# Patient Record
Sex: Male | Born: 1935 | Race: White | Marital: Married | Smoking: Never smoker
Health system: Southern US, Community
[De-identification: ages and names within clinical notes are randomized; demographics above are authoritative.]

## PROBLEM LIST (undated history)

## (undated) DIAGNOSIS — M545 Low back pain, unspecified: Secondary | ICD-10-CM

## (undated) DIAGNOSIS — E7800 Pure hypercholesterolemia, unspecified: Secondary | ICD-10-CM

## (undated) DIAGNOSIS — N400 Enlarged prostate without lower urinary tract symptoms: Secondary | ICD-10-CM

## (undated) DIAGNOSIS — M109 Gout, unspecified: Secondary | ICD-10-CM

## (undated) DIAGNOSIS — I2510 Atherosclerotic heart disease of native coronary artery without angina pectoris: Secondary | ICD-10-CM

## (undated) DIAGNOSIS — C61 Malignant neoplasm of prostate: Secondary | ICD-10-CM

## (undated) DIAGNOSIS — R0602 Shortness of breath: Secondary | ICD-10-CM

## (undated) DIAGNOSIS — I219 Acute myocardial infarction, unspecified: Secondary | ICD-10-CM

## (undated) DIAGNOSIS — I10 Essential (primary) hypertension: Secondary | ICD-10-CM

## (undated) DIAGNOSIS — G8929 Other chronic pain: Secondary | ICD-10-CM

## (undated) DIAGNOSIS — C44300 Unspecified malignant neoplasm of skin of unspecified part of face: Secondary | ICD-10-CM

## (undated) DIAGNOSIS — M1990 Unspecified osteoarthritis, unspecified site: Secondary | ICD-10-CM

## (undated) DIAGNOSIS — Z9289 Personal history of other medical treatment: Secondary | ICD-10-CM

## (undated) DIAGNOSIS — E119 Type 2 diabetes mellitus without complications: Secondary | ICD-10-CM

## (undated) DIAGNOSIS — N189 Chronic kidney disease, unspecified: Secondary | ICD-10-CM

## (undated) DIAGNOSIS — I82409 Acute embolism and thrombosis of unspecified deep veins of unspecified lower extremity: Secondary | ICD-10-CM

## (undated) HISTORY — PX: CATARACT EXTRACTION W/ INTRAOCULAR LENS  IMPLANT, BILATERAL: SHX1307

## (undated) HISTORY — PX: APPLICATION OF WOUND VAC: SHX5189

## (undated) HISTORY — PX: APPENDECTOMY: SHX54

## (undated) HISTORY — PX: BACK SURGERY: SHX140

---

## 2000-08-18 DIAGNOSIS — I219 Acute myocardial infarction, unspecified: Secondary | ICD-10-CM

## 2000-08-18 HISTORY — DX: Acute myocardial infarction, unspecified: I21.9

## 2000-11-09 HISTORY — PX: CORONARY ARTERY BYPASS GRAFT: SHX141

## 2012-11-15 ENCOUNTER — Other Ambulatory Visit: Payer: Self-pay | Admitting: Internal Medicine

## 2012-11-15 DIAGNOSIS — IMO0002 Reserved for concepts with insufficient information to code with codable children: Secondary | ICD-10-CM

## 2012-11-16 ENCOUNTER — Ambulatory Visit
Admission: RE | Admit: 2012-11-16 | Discharge: 2012-11-16 | Disposition: A | Discharge status: Home / Self Care | Payer: Medicare Other | Source: Ambulatory Visit | Attending: Internal Medicine | Admitting: Internal Medicine

## 2012-11-16 DIAGNOSIS — IMO0002 Reserved for concepts with insufficient information to code with codable children: Secondary | ICD-10-CM

## 2012-12-23 ENCOUNTER — Ambulatory Visit (HOSPITAL_COMMUNITY)
Admission: RE | Admit: 2012-12-23 | Discharge: 2012-12-23 | Disposition: A | Discharge status: Home / Self Care | Payer: Medicare Other | Source: Ambulatory Visit | Attending: Orthopedic Surgery | Admitting: Orthopedic Surgery

## 2012-12-23 ENCOUNTER — Other Ambulatory Visit (HOSPITAL_COMMUNITY): Payer: Self-pay | Admitting: Orthopedic Surgery

## 2012-12-23 DIAGNOSIS — M549 Dorsalgia, unspecified: Secondary | ICD-10-CM

## 2012-12-23 DIAGNOSIS — M438X9 Other specified deforming dorsopathies, site unspecified: Secondary | ICD-10-CM | POA: Insufficient documentation

## 2012-12-23 DIAGNOSIS — M51379 Other intervertebral disc degeneration, lumbosacral region without mention of lumbar back pain or lower extremity pain: Secondary | ICD-10-CM | POA: Insufficient documentation

## 2012-12-23 DIAGNOSIS — M8448XA Pathological fracture, other site, initial encounter for fracture: Secondary | ICD-10-CM | POA: Insufficient documentation

## 2012-12-23 DIAGNOSIS — M5137 Other intervertebral disc degeneration, lumbosacral region: Secondary | ICD-10-CM | POA: Insufficient documentation

## 2012-12-23 DIAGNOSIS — Z01818 Encounter for other preprocedural examination: Secondary | ICD-10-CM | POA: Insufficient documentation

## 2013-03-03 ENCOUNTER — Encounter (HOSPITAL_COMMUNITY): Payer: Self-pay

## 2013-03-07 NOTE — Pre-Procedure Instructions (Signed)
[name] [name]  03/07/2013   Your procedure is scheduled on:  Wednesday 03/16/13  Report to [facility] at 630 AM.  Call this number if you have problems the morning of surgery: [phone]   Remember:   Do not eat food or drink liquids after midnight.   Take these medicines the morning of surgery with A SIP OF WATER:  alphagan  Eye drops, flomax   Do not wear jewelry, make-up or nail polish.  Do not wear lotions, powders, or perfumes. You may wear deodorant.  Do not shave 48 hours prior to surgery. Men may shave face and neck.  Do not bring valuables to the hospital.  [facility] is not responsible                   for any belongings or valuables.  Contacts, dentures or bridgework may not be worn into surgery.  Leave suitcase in the car. After surgery it may be brought to your room.  For patients admitted to the hospital, checkout time is 11:00 AM the day of  discharge.   Patients discharged the day of surgery will not be allowed to drive  home.  Name and phone number of your driver:   Special Instructions: Shower using CHG 2 nights before surgery and the night before surgery.  If you shower the day of surgery use CHG.  Use special wash - you have one bottle of CHG for all showers.  You should use approximately 1/3 of the bottle for each shower.   Please read over the following fact sheets that you were given: Pain Booklet, Coughing and Deep Breathing, Blood Transfusion Information, MRSA Information and Surgical Site Infection Prevention

## 2013-03-08 ENCOUNTER — Ambulatory Visit (HOSPITAL_COMMUNITY)
Admission: RE | Admit: 2013-03-08 | Discharge: 2013-03-08 | Disposition: A | Discharge status: Home / Self Care | Payer: Medicare Other | Source: Ambulatory Visit | Attending: Orthopedic Surgery | Admitting: Orthopedic Surgery

## 2013-03-08 ENCOUNTER — Encounter (HOSPITAL_COMMUNITY)
Admission: RE | Admit: 2013-03-08 | Discharge: 2013-03-08 | Disposition: A | Discharge status: Home / Self Care | Payer: Medicare Other | Source: Ambulatory Visit | Attending: Orthopedic Surgery | Admitting: Orthopedic Surgery

## 2013-03-08 ENCOUNTER — Encounter (HOSPITAL_COMMUNITY): Payer: Self-pay

## 2013-03-08 DIAGNOSIS — J984 Other disorders of lung: Secondary | ICD-10-CM | POA: Insufficient documentation

## 2013-03-08 DIAGNOSIS — Z01818 Encounter for other preprocedural examination: Secondary | ICD-10-CM | POA: Insufficient documentation

## 2013-03-08 DIAGNOSIS — Z01812 Encounter for preprocedural laboratory examination: Secondary | ICD-10-CM | POA: Insufficient documentation

## 2013-03-08 DIAGNOSIS — M545 Low back pain, unspecified: Secondary | ICD-10-CM | POA: Insufficient documentation

## 2013-03-08 DIAGNOSIS — K449 Diaphragmatic hernia without obstruction or gangrene: Secondary | ICD-10-CM | POA: Insufficient documentation

## 2013-03-08 DIAGNOSIS — Z01810 Encounter for preprocedural cardiovascular examination: Secondary | ICD-10-CM | POA: Insufficient documentation

## 2013-03-08 DIAGNOSIS — M4000 Postural kyphosis, site unspecified: Secondary | ICD-10-CM | POA: Insufficient documentation

## 2013-03-08 DIAGNOSIS — M5137 Other intervertebral disc degeneration, lumbosacral region: Secondary | ICD-10-CM | POA: Insufficient documentation

## 2013-03-08 DIAGNOSIS — R9431 Abnormal electrocardiogram [ECG] [EKG]: Secondary | ICD-10-CM | POA: Insufficient documentation

## 2013-03-08 DIAGNOSIS — X58XXXA Exposure to other specified factors, initial encounter: Secondary | ICD-10-CM | POA: Insufficient documentation

## 2013-03-08 DIAGNOSIS — Z0183 Encounter for blood typing: Secondary | ICD-10-CM | POA: Insufficient documentation

## 2013-03-08 DIAGNOSIS — S22009A Unspecified fracture of unspecified thoracic vertebra, initial encounter for closed fracture: Secondary | ICD-10-CM | POA: Insufficient documentation

## 2013-03-08 DIAGNOSIS — I10 Essential (primary) hypertension: Secondary | ICD-10-CM | POA: Insufficient documentation

## 2013-03-08 DIAGNOSIS — M51379 Other intervertebral disc degeneration, lumbosacral region without mention of lumbar back pain or lower extremity pain: Secondary | ICD-10-CM | POA: Insufficient documentation

## 2013-03-08 HISTORY — DX: Unspecified osteoarthritis, unspecified site: M19.90

## 2013-03-08 HISTORY — DX: Atherosclerotic heart disease of native coronary artery without angina pectoris: I25.10

## 2013-03-08 HISTORY — DX: Essential (primary) hypertension: I10

## 2013-03-08 HISTORY — DX: Chronic kidney disease, unspecified: N18.9

## 2013-03-08 HISTORY — DX: Acute myocardial infarction, unspecified: I21.9

## 2013-03-08 LAB — ABO/RH: ABO/RH(D): A POS

## 2013-03-08 NOTE — Progress Notes (Addendum)
Anesthesia Chart Review:  Patient is a 79 year old male scheduled for T9-L3 fusion on 03/16/13 by Dr. Shon Baton.  He has a severe T12 compression fracture.  He developed back pain back in April 2014 after loading heavy supplies into his car while at [employer].  I was not asked to evaluate him during his [name] visit, but I did call and speak with him today.  History includes CAD/MI s/p CABG > 10 years ago, HTN, DM2, BPH, arthritis, appendectomy, former smoker.  He was previously followed at a [facility] in [place], but moved to [place] one year ago.  He is followed by the [facility] in [place] primarily, but has seen a physician once or twice at [place] [facility] [address].  He is not followed by a cardiologist and does not think that he has had any cardiac testing within the past five years.  He denies chest pain, SOB, edema. He is able to do his own shopping.  He reports that his DM has been fairly well controlled with numbers well below 250.  EKG on 03/08/13 showed NSR, LAD, non-specific ST abnormality.  CXR on 03/08/13 showed left basilar scarring.  Borderline heart size.  Moderate sized hiatal hernia.  Severe T12 compression fracture.    According to [name] RN, lab called and said the CBC and BMET tubes could not be located.  These will need to be repeated pre-operatively.  T&S was done.  I've notified [name] [name] at Dr. Shon Baton' office and the patient.  I will have our schedulers contact him for a lab only appointment.  Patient was medically cleared by a physician at the [facility] ([name]. B____ [illegible]), but patient denies any recent cardiology follow-up or known testing.  I have reviewed currently available information with anesthesiologists [name].  Based on what is known, he will likely need a functional study or cardiology evaluation preoperatively.  I will follow-up any additional records from the [facility].  I have also left a message for [name] [name] to call me to further discuss.  [name] [name] [facility]/Anesthesiology Phone [phone] 03/08/2013 4:56 PM  Addendum: 03/09/13 11:35 AM I received additional records from the [facility].  I asked for most recent notes and any cardiac records, and the only records received were his admission note and operative note from March 2002.  He presented with a NQWMI, underwent cardiac cath that showed 90% LM and severe 3V CAD.  A IABP was placed and he subsequently underwent CABG X 4 (LIMA to LAD, SVG to RPDA, sequential SVG to OM2 and D1) by [name] on 11/09/00.  I spoke with [name] [name] earlier today, and their office has arranged for patient to be evaluated by cardiologist Dr. Tresa [name] on 03/11/13.  Addendum: 03/15/13 3:40 PM Cardiologist Dr. Tresa [name] cleared patient for surgery from a cardiac standpoint.  Nuclear stress test from 03/15/13 showed: Low risk stress nuclear study with mild diaphragmatic attenuation artifact. LV Wall Motion: NL LV Function; NL Wall Motion.  Echo on 03/14/13 showed: - Left ventricle: The cavity size was normal. Wall thickness was increased in a pattern of mild LVH. Systolic function was normal. The estimated ejection fraction was in the range of 55% to 60%. Wall motion was normal; there were no regional wall motion abnormalities. Left ventricular diastolic function parameters were normal. - Left atrium: The atrium was moderately dilated. - Pulmonary arteries: PA peak pressure: 31mm Hg (S).  Labs from 03/14/13 showed elevated total bilirubin, but normal AST/ALT (  could consider Gilbert's syndrome; total bilirubin was also elevated at 1.8 01/20/13 according to [facility] records).  Cr 0.82, glucose 117.  H/H 13.2/38.2.  (CBC, CMET routed to Dr. Shon Baton for review, since these were ordered under Dr. Landry [name] name.) TSH WNL.    Patient has now been cleared for this procedure by cardiology and his PCP.

## 2013-03-08 NOTE — Progress Notes (Signed)
Pt had cabg > 10 yrs ago. [facility] give  [facility]for [facility] [place] [facility]   Recently moved here and has not [facility] with [facility] [place].

## 2013-03-09 ENCOUNTER — Encounter (HOSPITAL_COMMUNITY): Payer: Self-pay

## 2013-03-11 ENCOUNTER — Encounter: Payer: Self-pay | Admitting: Cardiovascular Disease

## 2013-03-11 ENCOUNTER — Ambulatory Visit (INDEPENDENT_AMBULATORY_CARE_PROVIDER_SITE_OTHER): Payer: Medicare Other | Admitting: Cardiovascular Disease

## 2013-03-11 VITALS — BP 138/80 | HR 79 | Ht 68.0 in | Wt 202.0 lb

## 2013-03-11 DIAGNOSIS — I2510 Atherosclerotic heart disease of native coronary artery without angina pectoris: Secondary | ICD-10-CM

## 2013-03-11 DIAGNOSIS — R5383 Other fatigue: Secondary | ICD-10-CM

## 2013-03-11 DIAGNOSIS — E119 Type 2 diabetes mellitus without complications: Secondary | ICD-10-CM

## 2013-03-11 DIAGNOSIS — Z79899 Other long term (current) drug therapy: Secondary | ICD-10-CM

## 2013-03-11 DIAGNOSIS — R5381 Other malaise: Secondary | ICD-10-CM

## 2013-03-11 DIAGNOSIS — I10 Essential (primary) hypertension: Secondary | ICD-10-CM

## 2013-03-11 DIAGNOSIS — E785 Hyperlipidemia, unspecified: Secondary | ICD-10-CM

## 2013-03-11 NOTE — Patient Instructions (Addendum)
Your physician recommends that you return for lab work CMP, CBC, TSH, FREE T4, LIPIDS.  Your physician has requested that you have an echocardiogram. Echocardiography is a painless test that uses sound waves to create images of your heart. It provides your doctor with information about the size and shape of your heart and how well your heart's chambers and valves are working. This procedure takes approximately one hour. There are no restrictions for this procedure.  Your physician has requested that you have a lexiscan myoview. For further information please visit [email or web address] Please follow instruction sheet, as given.

## 2013-03-14 ENCOUNTER — Ambulatory Visit (HOSPITAL_BASED_OUTPATIENT_CLINIC_OR_DEPARTMENT_OTHER)
Admission: RE | Admit: 2013-03-14 | Discharge: 2013-03-14 | Disposition: A | Discharge status: Home / Self Care | Payer: Medicare Other | Source: Ambulatory Visit | Attending: Internal Medicine | Admitting: Internal Medicine

## 2013-03-14 DIAGNOSIS — N189 Chronic kidney disease, unspecified: Secondary | ICD-10-CM | POA: Diagnosis present

## 2013-03-14 DIAGNOSIS — I2510 Atherosclerotic heart disease of native coronary artery without angina pectoris: Secondary | ICD-10-CM | POA: Diagnosis present

## 2013-03-14 DIAGNOSIS — Z01810 Encounter for preprocedural cardiovascular examination: Secondary | ICD-10-CM | POA: Insufficient documentation

## 2013-03-14 DIAGNOSIS — M4000 Postural kyphosis, site unspecified: Secondary | ICD-10-CM | POA: Diagnosis present

## 2013-03-14 DIAGNOSIS — S22009A Unspecified fracture of unspecified thoracic vertebra, initial encounter for closed fracture: Principal | ICD-10-CM | POA: Diagnosis present

## 2013-03-14 DIAGNOSIS — E119 Type 2 diabetes mellitus without complications: Secondary | ICD-10-CM | POA: Diagnosis present

## 2013-03-14 DIAGNOSIS — D62 Acute posthemorrhagic anemia: Secondary | ICD-10-CM | POA: Diagnosis not present

## 2013-03-14 DIAGNOSIS — IMO0002 Reserved for concepts with insufficient information to code with codable children: Secondary | ICD-10-CM | POA: Diagnosis present

## 2013-03-14 DIAGNOSIS — M4120 Other idiopathic scoliosis, site unspecified: Secondary | ICD-10-CM | POA: Diagnosis present

## 2013-03-14 DIAGNOSIS — X500XXA Overexertion from strenuous movement or load, initial encounter: Secondary | ICD-10-CM | POA: Diagnosis present

## 2013-03-14 DIAGNOSIS — I129 Hypertensive chronic kidney disease with stage 1 through stage 4 chronic kidney disease, or unspecified chronic kidney disease: Secondary | ICD-10-CM | POA: Diagnosis present

## 2013-03-14 LAB — COMPREHENSIVE METABOLIC PANEL
ALT: 14 U/L (ref 0–53)
AST: 25 U/L (ref 0–37)
Albumin: 3.7 g/dL (ref 3.5–5.2)
CO2: 27 mEq/L (ref 19–32)
Calcium: 9.6 mg/dL (ref 8.4–10.5)
Chloride: 112 mEq/L (ref 96–112)
Potassium: 3.9 mEq/L (ref 3.5–5.3)
Sodium: 146 mEq/L — ABNORMAL HIGH (ref 135–145)
Total Protein: 6.5 g/dL (ref 6.0–8.3)

## 2013-03-14 LAB — CBC
MCHC: 34.6 g/dL (ref 30.0–36.0)
Platelets: 159 10*3/uL (ref 150–400)
RDW: 14.5 % (ref 11.5–15.5)
WBC: 5.5 10*3/uL (ref 4.0–10.5)

## 2013-03-14 LAB — TSH: TSH: 1.685 u[IU]/mL (ref 0.350–4.500)

## 2013-03-14 LAB — LIPID PANEL
LDL Cholesterol: 91 mg/dL (ref 0–99)
VLDL: 17 mg/dL (ref 0–40)

## 2013-03-14 LAB — HEMOGLOBIN A1C: Mean Plasma Glucose: 111 mg/dL (ref <117)

## 2013-03-14 NOTE — Progress Notes (Signed)
[facility]   2D echo completed 03/14/2013.   Cindy Draylen Lobue, RDCS  

## 2013-03-15 ENCOUNTER — Encounter: Payer: Self-pay | Admitting: Cardiovascular Disease

## 2013-03-15 ENCOUNTER — Telehealth: Payer: Self-pay | Admitting: Cardiovascular Disease

## 2013-03-15 ENCOUNTER — Ambulatory Visit (HOSPITAL_BASED_OUTPATIENT_CLINIC_OR_DEPARTMENT_OTHER)
Admission: RE | Admit: 2013-03-15 | Discharge: 2013-03-15 | Disposition: A | Discharge status: Home / Self Care | Payer: Medicare Other | Source: Ambulatory Visit | Attending: Cardiovascular Disease | Admitting: Cardiovascular Disease

## 2013-03-15 DIAGNOSIS — E785 Hyperlipidemia, unspecified: Secondary | ICD-10-CM | POA: Insufficient documentation

## 2013-03-15 DIAGNOSIS — Z87891 Personal history of nicotine dependence: Secondary | ICD-10-CM | POA: Insufficient documentation

## 2013-03-15 DIAGNOSIS — R9431 Abnormal electrocardiogram [ECG] [EKG]: Secondary | ICD-10-CM | POA: Insufficient documentation

## 2013-03-15 DIAGNOSIS — E119 Type 2 diabetes mellitus without complications: Secondary | ICD-10-CM | POA: Insufficient documentation

## 2013-03-15 DIAGNOSIS — I252 Old myocardial infarction: Secondary | ICD-10-CM | POA: Insufficient documentation

## 2013-03-15 DIAGNOSIS — I2510 Atherosclerotic heart disease of native coronary artery without angina pectoris: Secondary | ICD-10-CM | POA: Insufficient documentation

## 2013-03-15 DIAGNOSIS — E663 Overweight: Secondary | ICD-10-CM | POA: Insufficient documentation

## 2013-03-15 DIAGNOSIS — S22081A Stable burst fracture of T11-T12 vertebra, initial encounter for closed fracture: Secondary | ICD-10-CM | POA: Insufficient documentation

## 2013-03-15 DIAGNOSIS — R0989 Other specified symptoms and signs involving the circulatory and respiratory systems: Secondary | ICD-10-CM | POA: Insufficient documentation

## 2013-03-15 DIAGNOSIS — R0609 Other forms of dyspnea: Secondary | ICD-10-CM | POA: Insufficient documentation

## 2013-03-15 DIAGNOSIS — I10 Essential (primary) hypertension: Secondary | ICD-10-CM | POA: Insufficient documentation

## 2013-03-15 HISTORY — PX: LUMBAR FUSION: SHX111

## 2013-03-15 MED ORDER — CEFAZOLIN SODIUM-DEXTROSE 2-3 GM-% IV SOLR
2.0000 g | INTRAVENOUS | Status: AC
  Administered 2013-03-16 (×2): 2 g via INTRAVENOUS
  Filled 2013-03-15: qty 50

## 2013-03-15 MED ORDER — REGADENOSON 0.4 MG/5ML IV SOLN
0.4000 mg | Freq: Once | INTRAVENOUS | Status: AC
  Administered 2013-03-15: 0.4 mg via INTRAVENOUS

## 2013-03-15 MED ORDER — TECHNETIUM TC 99M SESTAMIBI GENERIC - CARDIOLITE
30.0000 | Freq: Once | INTRAVENOUS | Status: AC | PRN
  Administered 2013-03-15: 30 via INTRAVENOUS

## 2013-03-15 MED ORDER — TECHNETIUM TC 99M SESTAMIBI GENERIC - CARDIOLITE
10.0000 | Freq: Once | INTRAVENOUS | Status: AC | PRN
  Administered 2013-03-15: 10 via INTRAVENOUS

## 2013-03-15 NOTE — Telephone Encounter (Signed)
Pt cleared for surgery per [name].  Form was completed and faxed to [facility].  Call to [facility] and informed.  Verbalized understanding.

## 2013-03-15 NOTE — Telephone Encounter (Signed)
Returned call.  Left message to call back before 4pm.  Message forwarded to [name] for review.  OV note still in progress.

## 2013-03-15 NOTE — Progress Notes (Signed)
Patient ID: [name] [name], male   DOB: 07-31-1934, 79 y.o.   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   HPI: [name] [name] is a 79 year old gentleman who has a history of hypertension for at least 15-20 years, type 2 diabetes mellitus, as well as hyperlipidemia. He is status post CABG surgery x4 which was done in [place] after he had suffered a myocardial infarction and was found to have high-grade left main stenosis. He underwent surgery x4 in 2002 with a LIMA to the LAD, SVG to the PDA, and sequential vein graft to the OM 2 and the one vessel. This was done by [name] [name] in [place]. He is 2 for area approximately one year ago. He does have a history of possible carotid disease and apparently had recently undergone a carotid Doppler test at the [facility] which I do not know the results per he recently had sustained a T12 burst fracture and is in need for surgery by [name] [name]. He presents now for preoperative cardiology clearance. He denies recent chest pain. He does note shortness of breath he also does note some leg swelling.  Past Medical History  Diagnosis Date  . Hypertension   . Diabetes mellitus without complication   . Chronic kidney disease     bph  . Arthritis   . Coronary artery disease   . Myocardial infarction     Past Surgical History  Procedure Laterality Date  . Appendectomy    . Coronary artery bypass graft  11/09/2000    LIMA to LAD, SVG to RPDA, seq SVG to OM2 and D1 by [name] [name] [facility]    No Known Allergies  Current Outpatient Prescriptions  Medication Sig Dispense Refill  . brimonidine (ALPHAGAN) 0.2 % ophthalmic solution Place 1 drop into both eyes 2 (two) times daily.      . calcium-vitamin D (OSCAL WITH D) 250-125 MG-UNIT per tablet Take 2 tablets by mouth 2 (two)  times daily.      . cholecalciferol (VITAMIN D) 1000 UNITS tablet Take 1,000 Units by mouth daily.      . Coenzyme Q10 (COQ-10) 100 MG CAPS Take 200 mg by mouth daily.      . fish oil-omega-3 fatty acids 1000 MG capsule Take 1 g by mouth daily.       [name] glipiZIDE (GLUCOTROL) 5 MG tablet Take 2.5 mg by mouth daily.       [name] glucosamine-chondroitin 500-400 MG tablet Take 1 tablet by mouth daily.       [name] losartan (COZAAR) 100 MG tablet Take 100 mg by mouth daily.      . metFORMIN (GLUCOPHAGE) 1000 MG tablet Take 1,000 mg by mouth daily.      . Multiple Vitamins-Minerals (MULTIVITAMIN WITH MINERALS) tablet Take 1 tablet by mouth daily.      . Pyridoxine HCl (VITAMIN B-6) 500 MG tablet Take 500 mg by mouth daily.      . simvastatin (ZOCOR) 20 MG tablet Take 10 mg by mouth every evening.      . tamsulosin (FLOMAX) 0.4 MG CAPS Take 0.4 mg by mouth daily.       No current facility-administered medications for this visit.    Social history is notable in that he is married for 54 years. 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 He is retired there is no tobacco history. He does not drink alcohol.  Family History  Problem Relation Age of Onset  . Diabetes Mother   . Stroke Brother     x2    ROS is negative for fever chills or night sweats. He is unaware of palpitations. He does note shortness of breath. He does have difficulty with vision particularly his left eye where he feels he cannot see well. He denies wheezing or denies bleeding. He's been limited by his significant low back discomfort and was found to have a fracture of T12 vertebrae the need for surgery. He does have some mild leg swelling. He denies paresthesias Other system review is negative.  PE BP 138/80  Pulse 79  Ht 5\' 8"  (1.727 m)  Wt 202 lb (91.627 kg)  BMI 30.72 kg/m2 General: Alert, oriented, no distress.  Skin: normal turgor, no rashes HEENT: Normocephalic, atraumatic. Pupils round and reactive;  sclera anicteric; Fundi no hemorrhages or exudates. Nose without nasal septal hypertrophy Mouth/Parynx benign; Mallinpatti scale 3 he is wearing dentures. Neck: No JVD, soft left carotid bruit per carotid briut Lungs: clear to ausculatation and percussion; no wheezing or rales Heart: RRR, s1 s2 normal 2/6 systolic murmur. Abdomen: soft, nontender; no hepatosplenomehaly, BS+; abdominal aorta nontender and not dilated by palpation. Pulses 2+ Extremities: Mild pretibial edema. no clubbinbg cyanosis, Homan's sign negative  Neurologic: grossly nonfocal Psychologic: normal affect and mood appear    ECG: Normal sinus rhythm. There is early transition. Nonspecific T abnormalities; intervals are normal.  LABS:  BMET    Component Value Date/Time   NA 146* 03/14/2013 0812   K 3.9 03/14/2013 0812   CL 112 03/14/2013 0812   CO2 27 03/14/2013 0812   GLUCOSE 117* 03/14/2013 0812   BUN 8 03/14/2013 0812   CREATININE 0.82 03/14/2013 0812   CALCIUM 9.6 03/14/2013 0812     Hepatic Function Panel     Component Value Date/Time   PROT 6.5 03/14/2013 0812   ALBUMIN 3.7 03/14/2013 0812   AST 25 03/14/2013 0812   ALT 14 03/14/2013 0812   ALKPHOS 38* 03/14/2013 0812   BILITOT 2.5* 03/14/2013 0812     CBC    Component Value Date/Time   WBC 5.5 03/14/2013 0812   RBC 4.25 03/14/2013 0812   HGB 13.2 03/14/2013 0812   HCT 38.2* 03/14/2013 0812   PLT 159 03/14/2013 0812   MCV 89.9 03/14/2013 0812   MCH 31.1 03/14/2013 0812   MCHC 34.6 03/14/2013 0812   RDW 14.5 03/14/2013 0812     BNP No results found for this basename: probnp    Lipid Panel     Component Value Date/Time   CHOL 148 03/14/2013 0808   TRIG 86 03/14/2013 0808   HDL 40 03/14/2013 0808   CHOLHDL 3.7 03/14/2013 0808   VLDL 17 03/14/2013 0808   LDLCALC 91 03/14/2013 0808     RADIOLOGY: Dg Chest 2 View  03/08/2013   *RADIOLOGY REPORT*  Clinical Data: Preop low back surgery.  Hypertension.  CHEST - 2 VIEW  Comparison: None.  Findings: Moderate  sized hiatal hernia.  Linear densities at the left base, likely scarring.  Right lung is clear.  Heart is borderline in size.  No effusions.  Severe compression fracture in the lower thoracic spine, likely T12 with associated kyphosis.  Prior median sternotomy and CABG.  IMPRESSION: Left basilar scarring.  Borderline heart size.  Moderate sized hiatal hernia.  Severe T12 compression fracture.   Original Report Authenticated By: Charlett Nose, M.D.   Dg Lumbar Spine 2-3 Views  03/08/2013   *RADIOLOGY REPORT*  Clinical Data: Preop fusion.  Low back pain.  LUMBAR SPINE - 2-3 VIEW  Comparison: 12/23/2012  Findings: Severe compression fracture again noted at T12, slightly progressed since prior study.  Associated kyphosis at this level.  Diffuse degenerative disc changes throughout the lumbar spine. Degenerative facet disease in the mid and lower lumbar spine.  No acute bony abnormality.  Aortic and iliac calcifications are present without visible aneurysm.  IMPRESSION: Severe T12 compression fracture, slightly progressed.  Degenerative disc and facet disease.   Original Report Authenticated By: Charlett Nose, M.D.     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 who has known coronary artery disease. In  2002 he presented to the [facility] in the setting of a non-ST segment elevation myocardial infarction. Cardiac catheterization revealed 90% left main stenosis with multiple segments of occlusion and disease representing three-vessel coronary obstructive disease. He  underwent CABG surgery x4 with LIMA to the LAD, SVG to PDA, and sequential SVG to the OM 2 and diagonal 1 vessel. Subsequently, he has done fairly well. He has been without chest pain. He does note shortness of breath. He does have peripheral vascular disease with a left carotid bruit on exam. His ECG is unremarkable. He is tentatively scheduled for surgery at the end of the month. I am scheduling him for  an echo Doppler study report, as well as a nuclear perfusion study for preoperative assessment. These will be reviewed and if these are not high risk to be getting clearance for his planned orthopedic surgery.    Lennette Bihari, MD, [facility] 03/15/2013 1:54 PM

## 2013-03-15 NOTE — Procedures (Addendum)
[facility] [address] [place] [address] [place] [address] [phone]  Cardiology Nuclear Med Study  [name] [name] is a 79 y.o. male     MRN : [id number]     DOB: Jan 12, 1934  Procedure Date: 03/15/2013  Nuclear Med Background Indication for Stress Test:  Surgical Clearance and Abnormal EKG History:  CAD;MI--2002;CABG X4--11/09/2000 Cardiac Risk Factors: History of Smoking, Hypertension, Lipids, NIDDM and Overweight  Symptoms:  DOE   Nuclear Pre-Procedure Caffeine/Decaff Intake:  7:00pm NPO After: 5:00am   IV Site: R Hand  IV 0.9% NS with Angio Cath:  22g  Chest Size (in):  42"  IV Started by: Emmit Pomfret, RN  Height: 5\' 8"  (1.727 m)  Cup Size: n/a  BMI:  Body mass index is 30.72 kg/(m^2). Weight:  202 lb (91.627 kg)   Tech Comments:  N/A    Nuclear Med Study 1 or 2 day study: 1 day  Stress Test Type:  Lexiscan  Order Authorizing Provider:  Nicki Guadalajara, MD   Resting Radionuclide: Technetium 90m Sestamibi  Resting Radionuclide Dose: 11.0 mCi   Stress Radionuclide:  Technetium 7m Sestamibi  Stress Radionuclide Dose: 29.6 mCi           Stress Protocol Rest HR: 73 Stress HR: 82  Rest BP: 167/97 Stress BP: 153/81  Exercise Time (min): n/a METS: n/a   Predicted Max HR: 144 bpm % Max HR: 56.94 bpm Rate Pressure Product: 21308  Dose of Adenosine (mg):  n/a Dose of Lexiscan: 0.4 mg  Dose of Atropine (mg): n/a Dose of Dobutamine: n/a mcg/kg/min (at max HR)  Stress Test Technologist: Esperanza Sheets, CCT Nuclear Technologist: Koren Shiver, CNMT   Rest Procedure:  Myocardial perfusion imaging was performed at rest 45 minutes following the intravenous administration of Technetium 57m Sestamibi. Stress Procedure:  The patient received IV Lexiscan 0.4 mg over 15-seconds.  Technetium 70m Sestamibi injected at 30-seconds.  There were no significant changes with Lexiscan.  Quantitative spect images were obtained after a 45 minute  delay.  Transient Ischemic Dilatation (Normal <1.22):  1.08 Lung/Heart Ratio (Normal <0.45):  0.28 QGS EDV:  86 ml QGS ESV:  25 ml LV Ejection Fraction: 71%     Rest ECG: NSR with non-specific ST-T wave changes  Stress ECG: No significant change from baseline ECG  QPS Raw Data Images:  Mild diaphragmatic attenuation.  Normal left ventricular size. Stress Images:  Normal homogeneous uptake in all areas of the myocardium. Rest Images:  Normal homogeneous uptake in all areas of the myocardium. Subtraction (SDS):  No evidence of ischemia.  Impression Exercise Capacity:  Lexiscan with no exercise. BP Response:  Normal blood pressure response. Clinical Symptoms:  No significant symptoms noted. ECG Impression:  No significant ST segment change suggestive of ischemia. Comparison with Prior Nuclear Study: No previous nuclear study performed  Overall Impression:  Low risk stress nuclear study with mild diaphragmatic attenuation artifact.  LV Wall Motion:  NL LV Function; NL Wall Motion   Rivka Baune, MD  03/15/2013 11:55 AM

## 2013-03-15 NOTE — Telephone Encounter (Signed)
Having surgery 6:30 tomorrow morning-need clarence for this asap-was seen here on 03-11-13-Please call asap.

## 2013-03-15 NOTE — Telephone Encounter (Signed)
[name] called back asking for status of clearance as pt is scheduled for surgery in the morning.  Informed [name] is completing note now and RN will notify her once information received.  Verbalized understanding and will refax clearance form.  Need response before 4pm.

## 2013-03-16 ENCOUNTER — Inpatient Hospital Stay (HOSPITAL_COMMUNITY): Payer: Medicare Other

## 2013-03-16 ENCOUNTER — Encounter: Payer: Self-pay | Admitting: *Deleted

## 2013-03-16 ENCOUNTER — Inpatient Hospital Stay (HOSPITAL_COMMUNITY): Payer: Medicare Other | Admitting: Anesthesiology

## 2013-03-16 ENCOUNTER — Encounter (HOSPITAL_COMMUNITY): Payer: Self-pay | Admitting: *Deleted

## 2013-03-16 ENCOUNTER — Encounter (HOSPITAL_COMMUNITY): Payer: Self-pay | Admitting: Vascular Surgery

## 2013-03-16 ENCOUNTER — Encounter (HOSPITAL_COMMUNITY)
Admission: RE | Disposition: A | Discharge status: Home / Self Care | Payer: Self-pay | Source: Ambulatory Visit | Attending: Orthopedic Surgery

## 2013-03-16 ENCOUNTER — Inpatient Hospital Stay (HOSPITAL_COMMUNITY)
Admission: RE | Admit: 2013-03-16 | Discharge: 2013-03-22 | DRG: 460 | Disposition: A | Discharge status: Home Health | Payer: Medicare Other | Source: Ambulatory Visit | Attending: Orthopedic Surgery | Admitting: Orthopedic Surgery

## 2013-03-16 HISTORY — PX: POSTERIOR LUMBAR FUSION 4 LEVEL: SHX6037

## 2013-03-16 LAB — GLUCOSE, CAPILLARY: Glucose-Capillary: 133 mg/dL — ABNORMAL HIGH (ref 70–99)

## 2013-03-16 SURGERY — POSTERIOR LUMBAR FUSION 4 LEVEL
Anesthesia: General | Site: Spine Thoracic | Wound class: Clean

## 2013-03-16 MED ORDER — ONDANSETRON HCL 4 MG/2ML IJ SOLN
INTRAMUSCULAR | Status: DC | PRN
  Administered 2013-03-16: 4 mg via INTRAVENOUS

## 2013-03-16 MED ORDER — PROPOFOL INFUSION 10 MG/ML OPTIME
INTRAVENOUS | Status: DC | PRN
  Administered 2013-03-16: 50 ug/kg/min via INTRAVENOUS

## 2013-03-16 MED ORDER — CEFAZOLIN SODIUM 1-5 GM-% IV SOLN
1.0000 g | Freq: Three times a day (TID) | INTRAVENOUS | Status: AC
  Administered 2013-03-16 – 2013-03-17 (×2): 1 g via INTRAVENOUS
  Filled 2013-03-16 (×2): qty 50

## 2013-03-16 MED ORDER — ONDANSETRON HCL 4 MG/2ML IJ SOLN: 4.0000 mg | INTRAMUSCULAR | Status: DC | PRN

## 2013-03-16 MED ORDER — ACETAMINOPHEN 500 MG PO TABS
1000.0000 mg | ORAL_TABLET | Freq: Four times a day (QID) | ORAL | Status: AC
  Administered 2013-03-17 (×3): 1000 mg via ORAL
  Filled 2013-03-16 (×4): qty 2

## 2013-03-16 MED ORDER — DEXTROSE 5 % IV SOLN
INTRAVENOUS | Status: DC | PRN
  Administered 2013-03-16: 09:00:00 via INTRAVENOUS

## 2013-03-16 MED ORDER — BUPIVACAINE-EPINEPHRINE 0.25% -1:200000 IJ SOLN
INTRAMUSCULAR | Status: DC | PRN
  Administered 2013-03-16: 10 mL

## 2013-03-16 MED ORDER — MORPHINE SULFATE (PF) 1 MG/ML IV SOLN
INTRAVENOUS | Status: AC
  Filled 2013-03-16: qty 25

## 2013-03-16 MED ORDER — NALOXONE HCL 0.4 MG/ML IJ SOLN: 0.4000 mg | INTRAMUSCULAR | Status: DC | PRN

## 2013-03-16 MED ORDER — VANCOMYCIN HCL 1000 MG IV SOLR
INTRAVENOUS | Status: AC
  Filled 2013-03-16: qty 1000

## 2013-03-16 MED ORDER — ACETAMINOPHEN 10 MG/ML IV SOLN
1000.0000 mg | Freq: Once | INTRAVENOUS | Status: AC
  Administered 2013-03-16: 1000 mg via INTRAVENOUS

## 2013-03-16 MED ORDER — PROPOFOL 10 MG/ML IV BOLUS
INTRAVENOUS | Status: DC | PRN
  Administered 2013-03-16: 100 mg via INTRAVENOUS

## 2013-03-16 MED ORDER — DIPHENHYDRAMINE HCL 12.5 MG/5ML PO ELIX: 12.5000 mg | ORAL_SOLUTION | Freq: Four times a day (QID) | ORAL | Status: DC | PRN

## 2013-03-16 MED ORDER — SODIUM CHLORIDE 0.9 % IV SOLN
INTRAVENOUS | Status: DC | PRN
  Administered 2013-03-16: 13:00:00 via INTRAVENOUS

## 2013-03-16 MED ORDER — SODIUM CHLORIDE 0.9 % IJ SOLN
3.0000 mL | INTRAMUSCULAR | Status: DC | PRN
  Administered 2013-03-20: 3 mL via INTRAVENOUS

## 2013-03-16 MED ORDER — ARTIFICIAL TEARS OP OINT
TOPICAL_OINTMENT | OPHTHALMIC | Status: DC | PRN
  Administered 2013-03-16: 1 via OPHTHALMIC

## 2013-03-16 MED ORDER — BRIMONIDINE TARTRATE 0.2 % OP SOLN
1.0000 [drp] | Freq: Two times a day (BID) | OPHTHALMIC | Status: DC
  Administered 2013-03-16 – 2013-03-22 (×12): 1 [drp] via OPHTHALMIC
  Filled 2013-03-16: qty 5

## 2013-03-16 MED ORDER — DEXAMETHASONE SODIUM PHOSPHATE 4 MG/ML IJ SOLN: 4.0000 mg | Freq: Once | INTRAMUSCULAR | Status: DC

## 2013-03-16 MED ORDER — INSULIN ASPART 100 UNIT/ML ~~LOC~~ SOLN
0.0000 [IU] | SUBCUTANEOUS | Status: DC
  Administered 2013-03-19 (×2): 3 [IU] via SUBCUTANEOUS

## 2013-03-16 MED ORDER — BUPIVACAINE-EPINEPHRINE PF 0.25-1:200000 % IJ SOLN
INTRAMUSCULAR | Status: AC
  Filled 2013-03-16: qty 30

## 2013-03-16 MED ORDER — SODIUM CHLORIDE 0.9 % IV SOLN: 250.0000 mL | INTRAVENOUS | Status: DC

## 2013-03-16 MED ORDER — DEXAMETHASONE 4 MG PO TABS
4.0000 mg | ORAL_TABLET | Freq: Four times a day (QID) | ORAL | Status: DC
  Administered 2013-03-16 – 2013-03-22 (×24): 4 mg via ORAL
  Filled 2013-03-16 (×27): qty 1

## 2013-03-16 MED ORDER — PROMETHAZINE HCL 25 MG/ML IJ SOLN: 6.2500 mg | INTRAMUSCULAR | Status: DC | PRN

## 2013-03-16 MED ORDER — SODIUM CHLORIDE 0.9 % IJ SOLN
3.0000 mL | Freq: Two times a day (BID) | INTRAMUSCULAR | Status: DC
  Administered 2013-03-16 – 2013-03-22 (×9): 3 mL via INTRAVENOUS

## 2013-03-16 MED ORDER — SODIUM CHLORIDE 0.9 % IR SOLN
Status: DC | PRN
  Administered 2013-03-16: 3000 mL

## 2013-03-16 MED ORDER — CEFAZOLIN SODIUM 1-5 GM-% IV SOLN
INTRAVENOUS | Status: AC
  Filled 2013-03-16: qty 100

## 2013-03-16 MED ORDER — 0.9 % SODIUM CHLORIDE (POUR BTL) OPTIME
TOPICAL | Status: DC | PRN
  Administered 2013-03-16: 1000 mL

## 2013-03-16 MED ORDER — LACTATED RINGERS IV SOLN
INTRAVENOUS | Status: DC
  Administered 2013-03-17 (×2): via INTRAVENOUS

## 2013-03-16 MED ORDER — OXYCODONE HCL 5 MG/5ML PO SOLN: 5.0000 mg | Freq: Once | ORAL | Status: DC | PRN

## 2013-03-16 MED ORDER — ZOLPIDEM TARTRATE 5 MG PO TABS: 5.0000 mg | ORAL_TABLET | Freq: Every evening | ORAL | Status: DC | PRN

## 2013-03-16 MED ORDER — MIDAZOLAM HCL 2 MG/2ML IJ SOLN: 0.5000 mg | Freq: Once | INTRAMUSCULAR | Status: DC | PRN

## 2013-03-16 MED ORDER — MIDAZOLAM HCL 5 MG/5ML IJ SOLN
INTRAMUSCULAR | Status: DC | PRN
  Administered 2013-03-16 (×2): 1 mg via INTRAVENOUS

## 2013-03-16 MED ORDER — DEXAMETHASONE SODIUM PHOSPHATE 4 MG/ML IJ SOLN
INTRAMUSCULAR | Status: DC | PRN
  Administered 2013-03-16: 4 mg via INTRAVENOUS

## 2013-03-16 MED ORDER — HYDROMORPHONE HCL PF 1 MG/ML IJ SOLN
INTRAMUSCULAR | Status: AC
  Filled 2013-03-16: qty 1

## 2013-03-16 MED ORDER — OXYCODONE HCL 5 MG PO TABS
10.0000 mg | ORAL_TABLET | ORAL | Status: DC | PRN
  Administered 2013-03-17 (×2): 10 mg via ORAL
  Filled 2013-03-16 (×2): qty 2

## 2013-03-16 MED ORDER — ALBUMIN HUMAN 5 % IV SOLN
INTRAVENOUS | Status: DC | PRN
  Administered 2013-03-16 (×2): via INTRAVENOUS

## 2013-03-16 MED ORDER — THROMBIN 20000 UNITS EX SOLR
CUTANEOUS | Status: AC
  Filled 2013-03-16: qty 20000

## 2013-03-16 MED ORDER — LOSARTAN POTASSIUM 50 MG PO TABS
100.0000 mg | ORAL_TABLET | Freq: Every day | ORAL | Status: DC
  Administered 2013-03-16 – 2013-03-22 (×7): 100 mg via ORAL
  Filled 2013-03-16 (×7): qty 2

## 2013-03-16 MED ORDER — SODIUM CHLORIDE 0.9 % IJ SOLN: 9.0000 mL | INTRAMUSCULAR | Status: DC | PRN

## 2013-03-16 MED ORDER — ACETAMINOPHEN 10 MG/ML IV SOLN
1000.0000 mg | Freq: Four times a day (QID) | INTRAVENOUS | Status: DC
  Administered 2013-03-16: 1000 mg via INTRAVENOUS
  Filled 2013-03-16 (×3): qty 100

## 2013-03-16 MED ORDER — SIMVASTATIN 10 MG PO TABS
10.0000 mg | ORAL_TABLET | Freq: Every evening | ORAL | Status: DC
  Administered 2013-03-16 – 2013-03-22 (×7): 10 mg via ORAL
  Filled 2013-03-16 (×7): qty 1

## 2013-03-16 MED ORDER — VANCOMYCIN HCL IN DEXTROSE 1-5 GM/200ML-% IV SOLN
INTRAVENOUS | Status: AC
  Filled 2013-03-16: qty 200

## 2013-03-16 MED ORDER — FLEET ENEMA 7-19 GM/118ML RE ENEM: 1.0000 | ENEMA | Freq: Once | RECTAL | Status: AC | PRN

## 2013-03-16 MED ORDER — OXYCODONE HCL 5 MG PO TABS: 5.0000 mg | ORAL_TABLET | Freq: Once | ORAL | Status: DC | PRN

## 2013-03-16 MED ORDER — TAMSULOSIN HCL 0.4 MG PO CAPS
0.4000 mg | ORAL_CAPSULE | Freq: Every day | ORAL | Status: DC
  Administered 2013-03-16 – 2013-03-22 (×7): 0.4 mg via ORAL
  Filled 2013-03-16 (×7): qty 1

## 2013-03-16 MED ORDER — SUCCINYLCHOLINE CHLORIDE 20 MG/ML IJ SOLN
INTRAMUSCULAR | Status: DC | PRN
  Administered 2013-03-16: 120 mg via INTRAVENOUS

## 2013-03-16 MED ORDER — FENTANYL CITRATE 0.05 MG/ML IJ SOLN
INTRAMUSCULAR | Status: DC | PRN
  Administered 2013-03-16: 450 ug via INTRAVENOUS
  Administered 2013-03-16: 100 ug via INTRAVENOUS
  Administered 2013-03-16 (×4): 50 ug via INTRAVENOUS

## 2013-03-16 MED ORDER — EPHEDRINE SULFATE 50 MG/ML IJ SOLN
INTRAMUSCULAR | Status: DC | PRN
  Administered 2013-03-16: 5 mg via INTRAVENOUS

## 2013-03-16 MED ORDER — SODIUM CHLORIDE 0.9 % IV SOLN
10.0000 mg | INTRAVENOUS | Status: DC | PRN
  Administered 2013-03-16: 15 ug/min via INTRAVENOUS

## 2013-03-16 MED ORDER — MORPHINE SULFATE (PF) 1 MG/ML IV SOLN
INTRAVENOUS | Status: DC
  Administered 2013-03-16: 17:00:00 via INTRAVENOUS
  Administered 2013-03-16: 7 mg via INTRAVENOUS
  Administered 2013-03-17: 2 mg via INTRAVENOUS
  Administered 2013-03-17: 1 mg via INTRAVENOUS

## 2013-03-16 MED ORDER — MENTHOL 3 MG MT LOZG: 1.0000 | LOZENGE | OROMUCOSAL | Status: DC | PRN

## 2013-03-16 MED ORDER — MEPERIDINE HCL 25 MG/ML IJ SOLN: 6.2500 mg | INTRAMUSCULAR | Status: DC | PRN

## 2013-03-16 MED ORDER — GLIPIZIDE 2.5 MG HALF TABLET
2.5000 mg | ORAL_TABLET | Freq: Every day | ORAL | Status: DC
  Administered 2013-03-17 – 2013-03-22 (×6): 2.5 mg via ORAL
  Filled 2013-03-16 (×7): qty 1

## 2013-03-16 MED ORDER — ACETAMINOPHEN 10 MG/ML IV SOLN
INTRAVENOUS | Status: AC
  Filled 2013-03-16: qty 100

## 2013-03-16 MED ORDER — PHENOL 1.4 % MT LIQD: 1.0000 | OROMUCOSAL | Status: DC | PRN

## 2013-03-16 MED ORDER — METFORMIN HCL 500 MG PO TABS
1000.0000 mg | ORAL_TABLET | Freq: Every day | ORAL | Status: DC
  Administered 2013-03-16 – 2013-03-22 (×7): 1000 mg via ORAL
  Filled 2013-03-16 (×7): qty 2

## 2013-03-16 MED ORDER — HYDROMORPHONE HCL PF 1 MG/ML IJ SOLN
0.2500 mg | INTRAMUSCULAR | Status: DC | PRN
  Administered 2013-03-16: 0.25 mg via INTRAVENOUS

## 2013-03-16 MED ORDER — METHOCARBAMOL 100 MG/ML IJ SOLN
500.0000 mg | Freq: Four times a day (QID) | INTRAVENOUS | Status: DC | PRN
  Filled 2013-03-16: qty 5

## 2013-03-16 MED ORDER — ONDANSETRON HCL 4 MG/2ML IJ SOLN: 4.0000 mg | Freq: Four times a day (QID) | INTRAMUSCULAR | Status: DC | PRN

## 2013-03-16 MED ORDER — DIPHENHYDRAMINE HCL 50 MG/ML IJ SOLN: 12.5000 mg | Freq: Four times a day (QID) | INTRAMUSCULAR | Status: DC | PRN

## 2013-03-16 MED ORDER — DOCUSATE SODIUM 100 MG PO CAPS
100.0000 mg | ORAL_CAPSULE | Freq: Two times a day (BID) | ORAL | Status: DC
  Administered 2013-03-16 – 2013-03-22 (×12): 100 mg via ORAL
  Filled 2013-03-16 (×12): qty 1

## 2013-03-16 MED ORDER — METHOCARBAMOL 500 MG PO TABS
500.0000 mg | ORAL_TABLET | Freq: Four times a day (QID) | ORAL | Status: DC | PRN
  Administered 2013-03-17 – 2013-03-22 (×6): 500 mg via ORAL
  Filled 2013-03-16 (×5): qty 1

## 2013-03-16 MED ORDER — VANCOMYCIN HCL 1000 MG IV SOLR
INTRAVENOUS | Status: DC | PRN
  Administered 2013-03-16: 1000 mg

## 2013-03-16 MED ORDER — DEXAMETHASONE SODIUM PHOSPHATE 4 MG/ML IJ SOLN
4.0000 mg | Freq: Four times a day (QID) | INTRAMUSCULAR | Status: DC
  Filled 2013-03-16 (×27): qty 1

## 2013-03-16 MED ORDER — SURGIFOAM 100 EX MISC
CUTANEOUS | Status: DC | PRN
  Administered 2013-03-16: 10:00:00 via TOPICAL

## 2013-03-16 MED ORDER — LACTATED RINGERS IV SOLN
INTRAVENOUS | Status: DC | PRN
  Administered 2013-03-16 (×3): via INTRAVENOUS

## 2013-03-16 SURGICAL SUPPLY — 67 items
BLADE SURG ROTATE 9660 (MISCELLANEOUS) IMPLANT
BONE CANC CHIPS 40CC CAN1/2 (Bone Implant) ×4 IMPLANT
BUR EGG ELITE 4.0 (BURR) ×2 IMPLANT
BUR MATCHSTICK NEURO 3.0 LAGG (BURR) ×2 IMPLANT
CHIPS CANC BONE 40CC CAN1/2 (Bone Implant) ×2 IMPLANT
CLOTH BEACON ORANGE TIMEOUT ST (SAFETY) ×2 IMPLANT
CONNECTOR EXPEDIUM SFX SZ A6 (Orthopedic Implant) ×2 IMPLANT
CORDS BIPOLAR (ELECTRODE) ×2 IMPLANT
COVER MAYO STAND STRL (DRAPES) ×4 IMPLANT
COVER SURGICAL LIGHT HANDLE (MISCELLANEOUS) ×2 IMPLANT
DRAPE C-ARM 42X72 X-RAY (DRAPES) ×4 IMPLANT
DRAPE POUCH INSTRU U-SHP 10X18 (DRAPES) ×2 IMPLANT
DRAPE SURG 17X23 STRL (DRAPES) ×2 IMPLANT
DRAPE U-SHAPE 47X51 STRL (DRAPES) ×2 IMPLANT
DRSG MEPILEX BORDER 4X12 (GAUZE/BANDAGES/DRESSINGS) ×2 IMPLANT
DRSG MEPILEX BORDER 4X8 (GAUZE/BANDAGES/DRESSINGS) ×2 IMPLANT
DURAPREP 26ML APPLICATOR (WOUND CARE) ×2 IMPLANT
ELECT BLADE 4.0 EZ CLEAN MEGAD (MISCELLANEOUS) ×2
ELECT BLADE 6.5 EXT (BLADE) ×2 IMPLANT
ELECT REM PT RETURN 9FT ADLT (ELECTROSURGICAL) ×2
ELECTRODE BLDE 4.0 EZ CLN MEGD (MISCELLANEOUS) ×1 IMPLANT
ELECTRODE REM PT RTRN 9FT ADLT (ELECTROSURGICAL) ×1 IMPLANT
GLOVE BIOGEL PI IND STRL 8.5 (GLOVE) ×1 IMPLANT
GLOVE BIOGEL PI INDICATOR 8.5 (GLOVE) ×1
GLOVE ECLIPSE 8.5 STRL (GLOVE) ×2 IMPLANT
GOWN PREVENTION PLUS XXLARGE (GOWN DISPOSABLE) ×2 IMPLANT
GOWN STRL REIN XL XLG (GOWN DISPOSABLE) ×4 IMPLANT
HANDPIECE INTERPULSE COAX TIP (DISPOSABLE) ×1
IV CATH 14GX2 1/4 (CATHETERS) IMPLANT
KIT BASIN OR (CUSTOM PROCEDURE TRAY) ×2 IMPLANT
KIT ORACLE NEUROMONITING (KITS) ×4 IMPLANT
KIT POSITION SURG JACKSON T1 (MISCELLANEOUS) ×2 IMPLANT
KIT ROOM TURNOVER OR (KITS) ×2 IMPLANT
KIT STIMULAN RAPID CURE  10CC (Orthopedic Implant) ×1 IMPLANT
KIT STIMULAN RAPID CURE 10CC (Orthopedic Implant) ×1 IMPLANT
MIX DBX 20CC MTF (Putty) ×4 IMPLANT
NEEDLE 22X1 1/2 (OR ONLY) (NEEDLE) ×2 IMPLANT
NEEDLE SPNL 18GX3.5 QUINCKE PK (NEEDLE) ×2 IMPLANT
NS IRRIG 1000ML POUR BTL (IV SOLUTION) ×2 IMPLANT
PACK LAMINECTOMY ORTHO (CUSTOM PROCEDURE TRAY) ×2 IMPLANT
PACK UNIVERSAL I (CUSTOM PROCEDURE TRAY) ×2 IMPLANT
PAD ARMBOARD 7.5X6 YLW CONV (MISCELLANEOUS) ×4 IMPLANT
PATTIES SURGICAL .5 X.5 (GAUZE/BANDAGES/DRESSINGS) IMPLANT
PATTIES SURGICAL .5 X1 (DISPOSABLE) ×2 IMPLANT
ROD EXPEDIUM 480MM (Rod) ×4 IMPLANT
SCREW POLY EXPEDIUM 5.5 5X40MM (Screw) ×12 IMPLANT
SCREW POLY EXPEDIUM 5.5 6X40MM (Screw) ×12 IMPLANT
SCREW SET EXPEDIUM 8MM (Screw) ×24 IMPLANT
SET HNDPC FAN SPRY TIP SCT (DISPOSABLE) ×1 IMPLANT
SPONGE LAP 4X18 X RAY DECT (DISPOSABLE) ×12 IMPLANT
SPONGE SURGIFOAM ABS GEL 100 (HEMOSTASIS) ×2 IMPLANT
STRIP CLOSURE SKIN 1/2X4 (GAUZE/BANDAGES/DRESSINGS) ×2 IMPLANT
STYLET WITH SOFT DISTAL TIP ENDOTRACHEAL TUBE GUIDE ×2 IMPLANT
SURGIFLO TRUKIT (HEMOSTASIS) ×2 IMPLANT
SUT MNCRL AB 3-0 PS2 18 (SUTURE) ×4 IMPLANT
SUT VIC AB 1 CTX 18 (SUTURE) ×4 IMPLANT
SUT VIC AB 1 CTX 36 (SUTURE) ×2
SUT VIC AB 1 CTX36XBRD ANBCTR (SUTURE) ×2 IMPLANT
SUT VIC AB 2-0 CT1 18 (SUTURE) ×4 IMPLANT
SYR BULB IRRIGATION 50ML (SYRINGE) ×2 IMPLANT
SYR CONTROL 10ML LL (SYRINGE) ×2 IMPLANT
TOWEL OR 17X24 6PK STRL BLUE (TOWEL DISPOSABLE) ×2 IMPLANT
TOWEL OR 17X26 10 PK STRL BLUE (TOWEL DISPOSABLE) ×2 IMPLANT
TRAY FOLEY CATH 16FRSI W/METER (SET/KITS/TRAYS/PACK) ×2 IMPLANT
TUBE CONNECTING 12X1/4 (SUCTIONS) ×2 IMPLANT
WATER STERILE IRR 1000ML POUR (IV SOLUTION) IMPLANT
YANKAUER SUCT BULB TIP NO VENT (SUCTIONS) ×4 IMPLANT

## 2013-03-16 NOTE — Brief Op Note (Signed)
03/16/2013  2:17 PM  PATIENT:  [name] [name]  79 y.o. male  PRE-OPERATIVE DIAGNOSIS:  thoracic twelve burst fracture  POST-OPERATIVE DIAGNOSIS:  thoracic twelve burst fracture  PROCEDURE:  Procedure(s): T9 - L3 POSTERIOR POSTERIOR SPINAL FUSION  (N/A)  SURGEON:  Surgeon(s) and Role:    * Venita Lick, MD - Primary  PHYSICIAN ASSISTANT:   ASSISTANTS: none   ANESTHESIA:   general  EBL:  Total I/O In: 4035 [I.V.:3400; Blood:135; IV Piggyback:500] Out: 1250 [Urine:750; Blood:500]  BLOOD ADMINISTERED: Cell saver: 135cc  DRAINS: none   LOCAL MEDICATIONS USED:  MARCAINE     SPECIMEN:  No Specimen  DISPOSITION OF SPECIMEN:  N/A  COUNTS:  YES  TOURNIQUET:  * No tourniquets in log *  DICTATION: .Other Dictation: Dictation Number [id number]  PLAN OF CARE: Admit to inpatient   PATIENT DISPOSITION:  PACU - hemodynamically stable.

## 2013-03-16 NOTE — Anesthesia Preprocedure Evaluation (Addendum)
Anesthesia Evaluation  Patient identified by MRN, date of birth, ID band Patient awake    Reviewed: Allergy & Precautions, H&P , NPO status , Patient's Chart, lab work & pertinent test results  History of Anesthesia Complications Negative for: history of anesthetic complications  Airway Mallampati: I TM Distance: >3 FB Neck ROM: Full    Dental  (+) Edentulous Upper and Edentulous Lower   Pulmonary former smoker,    Pulmonary exam normal       Cardiovascular hypertension, Pt. on medications + CAD and + Past MI Rhythm:Regular Rate:Normal  myoview 03/15/13: no ischemia of scar, EF 71%   Neuro/Psych negative neurological ROS     GI/Hepatic negative GI ROS, Neg liver ROS,   Endo/Other  diabetes (glu 133), Well Controlled, Type 2, Oral Hypoglycemic AgentsMorbid obesity  Renal/GU negative Renal ROS     Musculoskeletal   Abdominal   Peds  Hematology   Anesthesia Other Findings   Reproductive/Obstetrics                          Anesthesia Physical Anesthesia Plan  ASA: III  Anesthesia Plan: General   Post-op Pain Management:    Induction: Intravenous  Airway Management Planned: Oral ETT  Additional Equipment:   Intra-op Plan:   Post-operative Plan: Extubation in OR  Informed Consent: I have reviewed the patients History and Physical, chart, labs and discussed the procedure including the risks, benefits and alternatives for the proposed anesthesia with the patient or authorized representative who has indicated his/her understanding and acceptance.   Dental advisory given  Plan Discussed with: CRNA and Surgeon  Anesthesia Plan Comments: (Plan routine monitors, GETA)        Anesthesia Quick Evaluation

## 2013-03-16 NOTE — H&P (Signed)
History of Present Illness  The patient is a 79 year old male who presents with back pain. The patient is here today in referral from Dr. Ranell Patrick . The patient reports mid back and low back symptoms including pain which began 6 week(s) ago following a specific injury (lifting supplies from [employer] with pain the next day ). and Symptoms include pain (lower lumbar with radiating pain into the abd. ), while symptoms do not include numbness, weakness, incontinence of stool or incontinence of urine. Current treatment includes non-opioid analgesics (Ultram ) and muscle relaxants (Robaxin ). Past evaluation has included MRI of the lumbar spine (with T12 burst fx. dx. ) and orthopedic evaluation.  Subjective Transcription [name] [name] presents today for a preoperative evaluation. We again have gone over his MRI that shows significant collapse of the T12 vertebral body secondary to the burst. There is retropulsion of 4 mm of bone but no cord deformity or central canal stenosis. There is mild degenerative disease at L3-4, L2-3 and L4-5. A this point he is having only severe back pain.   Allergies No Known Drug Allergies. 11/16/2012   Social History Tobacco use. never smoker   Medication History Cipro XR (500MG  Tablet ER 24HR, Oral) Active. Vitamin B 12 ( Tablet, Oral) Active. Losartan Potassium (100MG  Tablet, Oral) Active. Fish Oil Burp-Less (1200MG  Capsule, Oral) Active. Vitamin D3 High Potency (1000UNIT Capsule, Oral) Active. Simvastatin (20MG  Tablet, Oral) Active. Calcium-Vitamin D (500MG  Capsule, Oral) Active. MetFORMIN HCl (1000MG  Tablet, Oral) Active. Glucosamine Chondroitin Complx ( Oral) Active. GlipiZIDE XL (5MG  Tablet ER 24HR, Oral) Active. Tylenol Extra Strength (500MG  Tablet, Oral) Active. Co Q-10 (100MG  Capsule, Oral) Active. Tamsulosin HCl (0.4MG  Capsule ER, Oral) Active.   Vitals 03/08/2013 10:51 AM Weight: 203 lb Height: 68 in Body Surface Area: 2.1 m Body Mass  Index: 30.87 kg/m Pulse: 81 (Regular) BP: 167/79 (Sitting, Left Arm, Standard)    Objective Transcription  He is a pleasant gentleman who appears his stated age. He is in no acute distress. He is alert and oriented times three. No hip, knee or ankle pain with joint range of motion. He is grossly neurologically intact in the lower extremity. Sensation and motor functions are normal. Negative Babinski. Negative clonus. There are 2+ symmetrical DTR's. There is 1+ dorsal pedis and posterior tibialis pulses. Horrific mid lumbar and lower thoracic pain with palpation, significantly increased with forward flexion or extension. No incontinence of bowel or bladder. Abdomen is soft and nontender. No shortness of breath or chest pain.  X-rays from 12-21-2012 were compared to 11-14-2012. On 11-14-2012, he had 13 degrees of local kyphosis, what appeared to be a T12 compression fracture with about 50% loss of anterior height. On today's exam he has now 34 degrees of local kyphosis, significant, almost vertebral plana.  MRI shows posterior vertebral wall involvement with compromise and posterior retropulsion of bone causing mild to moderate central stenosis. There is no cord contusion or signal changes. He has 80% loss of height on the MRI centrally, 30% loss of height anteriorly. He has disc degeneration at 12-1, 1-2, 2-3, 3-4, 4-5, a burst fracture of T12 but no cord deformity or central canal stenosis.   Assessment & Plan Burst fracture of T12 vertebra (806.25)  Risks of surgery include, but are not limited to: Death, stroke, paralysis, nerve root damage/injury, bleeding, blood clots, loss of bowel/bladder control, sexual dysfunction, retrograde ejaculation, hardware failure, or malposition, spinal fluid leak, adjacent segment disease, non-union, need for further surgery, ongoing or worse pain, injury to  bladder, bowel and abdominal contents, infection and recurrent disc herniation  We have  gone over the risks and benefits of surgery, which include infection, bleeding, nerve damage, death, stroke, paralysis, failure to heal, need for further surgery, ongoing or worse pain, loss of fixation, need for further surgery, CSF leak, loss of bowel or bladder control, ongoing or worse pain.   Plans Transcription  At this point in time, we have gone over the surgical procedure which would be a thoracolumbar instrumented fusion. All of the risks including infection, bleeding, nerve damage, death, stroke, paralysis, failure to heal and the need for further surgery, ongoing or worsening pain, non-union were discussed. He will be using the external bone stimulator and more than likely will require assisted living or a nursing home discharge following his surgery. All questions were encouraged and answered. We will plan on proceeding with surgery next week on 03-16-13.

## 2013-03-16 NOTE — Anesthesia Procedure Notes (Signed)
Procedure Name: Intubation Date/Time: 03/16/2013 8:46 AM Performed by: Wray Kearns A Pre-anesthesia Checklist: Patient identified, Timeout performed, Emergency Drugs available, Suction available and Patient being monitored Patient Re-evaluated:Patient Re-evaluated prior to inductionOxygen Delivery Method: Circle system utilized Preoxygenation: Pre-oxygenation with 100% oxygen Intubation Type: IV induction and Cricoid Pressure applied Ventilation: Mask ventilation without difficulty Laryngoscope Size: Mac and 4 Grade View: Grade I Tube type: Oral Number of attempts: 1 Airway Equipment and Method: Stylet Placement Confirmation: ETT inserted through vocal cords under direct vision,  breath sounds checked- equal and bilateral and positive ETCO2 Secured at: 22 cm Tube secured with: Tape Dental Injury: Teeth and Oropharynx as per pre-operative assessment  Comments: Upper denture and lower plate removed prior to induction.

## 2013-03-16 NOTE — Preoperative (Signed)
Beta Blockers   Reason not to administer Beta Blockers:Not Applicable 

## 2013-03-16 NOTE — Transfer of Care (Signed)
Immediate Anesthesia Transfer of Care Note  Patient: [name] [name]  Procedure(s) Performed: Procedure(s): T9 - L3 POSTERIOR POSTERIOR SPINAL FUSION  (N/A)  Patient Location: PACU  Anesthesia Type:General  Level of Consciousness: sedated, patient cooperative and responds to stimulation  Airway & Oxygen Therapy: Patient Spontanous Breathing and Patient connected to face mask oxygen  Post-op Assessment: Report given to PACU RN, Post -op Vital signs reviewed and stable, Patient moving all extremities and Patient moving all extremities X 4  Post vital signs: Reviewed and stable  Complications: No apparent anesthesia complications

## 2013-03-16 NOTE — Progress Notes (Signed)
Quick Note:  Note sent to patient ______ 

## 2013-03-17 ENCOUNTER — Encounter (HOSPITAL_COMMUNITY): Payer: Self-pay | Admitting: General Practice

## 2013-03-17 ENCOUNTER — Encounter: Payer: Self-pay | Admitting: Cardiovascular Disease

## 2013-03-17 LAB — CBC
Hemoglobin: 10.3 g/dL — ABNORMAL LOW (ref 13.0–17.0)
MCH: 32.3 pg (ref 26.0–34.0)
MCV: 90.3 fL (ref 78.0–100.0)
RBC: 3.19 MIL/uL — ABNORMAL LOW (ref 4.22–5.81)
RDW: 14.5 % (ref 11.5–15.5)
WBC: 10.1 10*3/uL (ref 4.0–10.5)

## 2013-03-17 LAB — GLUCOSE, CAPILLARY
Glucose-Capillary: 136 mg/dL — ABNORMAL HIGH (ref 70–99)
Glucose-Capillary: 173 mg/dL — ABNORMAL HIGH (ref 70–99)
Glucose-Capillary: 176 mg/dL — ABNORMAL HIGH (ref 70–99)

## 2013-03-17 LAB — POCT I-STAT 4, (NA,K, GLUC, HGB,HCT)
Hemoglobin: 10.5 g/dL — ABNORMAL LOW (ref 13.0–17.0)
Potassium: 3.3 mEq/L — ABNORMAL LOW (ref 3.5–5.1)
Sodium: 142 mEq/L (ref 135–145)

## 2013-03-17 LAB — HEMOGLOBIN A1C: Mean Plasma Glucose: 120 mg/dL — ABNORMAL HIGH (ref <117)

## 2013-03-17 MED ORDER — FERROUS FUMARATE 325 (106 FE) MG PO TABS
1.0000 | ORAL_TABLET | Freq: Every day | ORAL | Status: DC
  Administered 2013-03-17 – 2013-03-22 (×6): 106 mg via ORAL
  Filled 2013-03-17 (×7): qty 1

## 2013-03-17 MED FILL — Sodium Chloride IV Soln 0.9%: INTRAVENOUS | Qty: 2000 | Status: AC

## 2013-03-17 MED FILL — Heparin Sodium (Porcine) Inj 1000 Unit/ML: INTRAMUSCULAR | Qty: 30 | Status: AC

## 2013-03-17 NOTE — Progress Notes (Signed)
SW received a consult for possible placement. PT  At this time is recommending home with HH and not SNF. CSW will make CM aware. Clinical Social Worker will sign off for now as social work intervention is no longer needed. Please consult us again if new need arises.   Jullisa Grigoryan, MSW [phone] 

## 2013-03-17 NOTE — Op Note (Signed)
NAMESAYRE, [name] [name]             ACCOUNT NO.:  [id number]  MEDICAL RECORD NO.:  [id number]  LOCATION:  5N03C                        FACILITY:  [facility]  PHYSICIAN:  Alvy Beal, MD    DATE OF BIRTH:  1934-07-08  DATE OF PROCEDURE:  03/16/2013 DATE OF DISCHARGE:                              OPERATIVE REPORT   PREOPERATIVE DIAGNOSIS:  T12 burst fracture.  POSTOPERATIVE DIAGNOSIS:  T12 burst fracture.  OPERATIVE PROCEDURE:  Posterior spinal instrumentation and fusion at T9- L3 (open reduction and internal fixation of T12 burst fracture)  INSTRUMENTATION SYSTEM USED:  De[employer] pedicle screw system.  Evoked motor, sensory potentials and EMGs performed by the NeuroStim.  COMPLICATIONS:  None.  CONDITION:  Stable.  HISTORY:  This is a very pleasant gentleman who unfortunately suffered a T12 burst fracture with increased local kyphosis.  He continued to have severe debilitating back pain without any neurological deficits.  As a result of the failure to improve with extensive conservative management, we elected to proceed with surgery.  All appropriate risks, benefits, and alternatives were discussed with the patient and consent was obtained.  OPERATIVE NOTE:  The patient was brought to the operating room, placed supine on the operating table.  After successful induction of general anesthesia and endotracheal intubation, TEDs, SCDs and Foley were inserted, and NeuroStim representative placed all appropriate needles for intraoperative monitoring.  The patient was then turned prone onto the spine frame.  All bony prominences were well padded and the back was prepped and draped in a standard fashion.  Time-out was taken to confirm the patient, procedure, and all other pertinent important data.  Once this was done, a midline incision was made starting at the superior portion of T8 and proceeding down to the inferior portion of L4.  Sharp dissection was carried down to the deep  fascia.  Using electrocautery, obtained hemostasis.  I then began stripping the paraspinal muscles to expose the spinous process and lamina using electrocautery and a Cobb. I mobilized the paraspinal muscles from T9-L4 and exposed the spinous process, lamina and facet complex.  An x-ray was taken to confirm the L3- 4 level and hence the L3 pedicle.  Once this was done, I then began removing the facet complex at T8-9, T9-10, T10-11, T12-L1, L1-L2, and L2- L3.  Once I had the facet capsules removed, I then exposed the L1, L2 and L3 transverse processes.  At this point, I had the posterior decompression complete.  Fluoro was brought into the AP plane and I identified the T9 vertebral body.  I could visualize the pedicle itself.  High-speed bur used to decorticate and then I advanced a pedicle probe down to the medial border of the pedicle as seen on the AP view.  I then went to the lateral view and confirmed that I was just beyond the posterior vertebral body.  I then advanced it into the vertebral body.  I then removed it, probed the hole with ball-tip Feeler and then tapped with a 4-0 tap.  I then repalpated with the ball-tipped Feeler and then placed a 40-mm length 5.0 diameter pedicle screw.  This entire procedure was repeated at T10 and T11, and on  the contralateral side at T9, T10 and T11.  Once I had the upper portion of the fixation complete, I then went to the lumbar.  Using anatomical landmarks of the transverse process and facet complex, I used an awl to broach the cortex and then advanced the pedicle probe through the pedicle and into the vertebral body.  I then confirmed trajectory in position of these pedicle probes with AP and lateral fluoro view.  I then removed the pedicle probe, palpated with a ball-tip Feeler, tapped with the 5.0 diameter pedicle tap, repalpated with the ball-tipped Feeler to confirm that the pedicle hole was intact and then placed the appropriate-sized  pedicle screw.  This was a 40-mm length 6.0 diameter pedicle screw.  This procedure was repeated on the contralateral side at L1 and then again at L2 and L3 bilaterally.  Once all the screws were placed in the lower end, I then stimulated each screw independently.  All screws were tested well within the normal range with no electrodiagnostic evidence of pedicle breach.  The evoked motors remained normal and in fact were somewhat improved from their baseline.  Once the pedicle screws were all in place, I then contoured two rods and then secured them.  Using the rod, I was able to get some improvement in the local kyphosis.  Intraoperative lateral x-ray confirmed actual significant improvement in the kyphotic deformity at the T12 burst fracture level.  At this point with the improved overall alignment, I elected not to proceed with the osteotomy and decompression as I had an improved alignment.  The locking bolts were placed and secured into position.  All screws were torqued down according to manufacturer's standards.  The spinous processes of T9, 10, 11, 12, 1 and 2 were removed for bone graft.  Using a high-speed bur, I decorticated the remaining lamina of T9, 10, 11, and 12, and then the transverse process of L1, L2 and L3.  I then used a total of 80 mL of cancellous bone graft, 40 mL DBX Mix plus the local bone from the harvest.  At this point, I then placed a cross-link at the fracture level and torqued it down appropriately.  I then took final intraoperative AP and lateral x-rays, which were satisfactory for improved sagittal alignment and scoliosis.  At this point, once this was completed, I then irrigated copiously with pulsatile lavage and then placed my bone graft into the wound.  I then placed vancomycin impregnated beads to help reduce the incidence of infection.  I closed the deep fascia with interrupted #1 Vicryl suture, superficial with 2-0 Vicryl sutures and a 3-0 Monocryl  for the skin.  Steri-Strips and dry dressing were applied.  The patient was ultimately extubated and transferred to the PACU without incident.  At the end of the case, all needle and sponge counts were correct.  There was no adverse intraoperative events.     Alvy Beal, MD     DDB/MEDQ  D:  03/16/2013  T:  03/17/2013  Job:  [id number]

## 2013-03-17 NOTE — Evaluation (Signed)
Physical Therapy Evaluation Patient Details Name: [name] [name] MRN: [id number] DOB: Jan 16, 1934 Today's Date: 03/17/2013 Time: 4098-1191 PT Time Calculation (min): 49 min  PT Assessment / Plan / Recommendation History of Present Illness  79 year old male admitted with back pain 6 week(s) ago following a specific injury (lifting supplies from [employer] with pain the next day ).Evaluation revealed T12 burst fx. Pt underwent T9 - L3 posterior spinal fusion    Clinical Impression  Pt mobilizing well, ambulating with min-guard assist and RW but has most difficulty with bed mobility. 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 Daughter has been helping recently but will only be available 6 days after D/C. Pt to D/C to [facility], discussed level of assist needed for pt and the fact that he will not be able to assist wife for a few weeks. Also recommended working with staff and therapy at ALF to create assistance schedule for pt and wife to promote safety and minimize risk for falls. Pt will benefit from continued rehab acutely.     PT Assessment  Patient needs continued PT services    Follow Up Recommendations  Home health PT;Supervision for mobility/OOB    Does the patient have the potential to tolerate intense rehabilitation    Pt declines CIR stay  Barriers to Discharge Decreased caregiver support Daughter reports she will be staying with pt and his wife (who has dementia) for about a week at their new place ([facility]), afterwards she will have to arrange a caregiver for pt's wife. Pt will likely still need assistance with ADLs, encouraged daugther to work with ALF and therapy team to establish routine assistance a the facility to promote safety.     Equipment Recommendations   (daughter reports she has RW)       Frequency Min 5X/week    Precautions / Restrictions Precautions Precautions: Back Restrictions Weight Bearing Restrictions: No   Pertinent  Vitals/Pain 8/10 surgical back pain, RN made aware      Mobility  Bed Mobility Bed Mobility: Rolling Right;Right Sidelying to Sit Rolling Right: 4: Min assist Right Sidelying to Sit: 3: Mod assist Details for Bed Mobility Assistance: Cues for log roll technique. At end of session daughter taught technique and advised on level of assist needed.  Transfers Transfers: Sit to Stand;Stand to Sit Sit to Stand: 4: Min assist;From bed Stand to Sit: 4: Min guard;To chair/3-in-1;With armrests Details for Transfer Assistance: Pt very slow with transitions due to pain. Cues for safe UE placement. Ambulation/Gait Ambulation/Gait Assistance: 4: Min guard Ambulation Distance (Feet): 150 Feet Assistive device: Rolling walker Ambulation/Gait Assistance Details: Trial of RW vs. no device revealed ambulation with RW much safer at this time due to impaired dynamic stability, daughter made aware. Cues needed initially for RW management and proximity but with practice pt doing well without need for cues.  Gait Pattern: Step-through pattern;Trunk flexed (low foot clearance) Stairs: No Wheelchair Mobility Wheelchair Mobility: No        PT Diagnosis: Difficulty walking;Abnormality of gait;Generalized weakness;Acute pain  PT Problem List: Decreased strength;Decreased activity tolerance;Decreased knowledge of use of DME;Decreased balance;Pain;Decreased knowledge of precautions;Decreased safety awareness;Decreased mobility PT Treatment Interventions: DME instruction;Gait training;Functional mobility training;Therapeutic activities;Patient/family education;Neuromuscular re-education;Balance training;Therapeutic exercise     PT Goals(Current goals can be found in the care plan section) Acute Rehab PT Goals Patient Stated Goal: Get to ALF and take care of wife PT Goal Formulation: With patient Time For Goal Achievement: 03/24/13 Potential to Achieve Goals:  Good  Visit Information  Last PT Received On:  03/17/13 Assistance Needed: +1 History of Present Illness: 79 year old male admitted with back pain 6 week(s) ago following a specific injury (lifting supplies from [employer] with pain the next day ).Evaluation revealed T12 burst fx. 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  Communication Communication: No difficulties Dominant Hand: Right    Cognition  Cognition Arousal/Alertness: Awake/alert Overall Cognitive Status: Within Functional Limits for tasks assessed (however min decreased awareness off deficits and memory)    Extremity/Trunk Assessment Upper Extremity Assessment Upper Extremity Assessment: Defer to OT evaluation Lower Extremity Assessment Lower Extremity Assessment: Generalized weakness Cervical / Trunk Assessment Cervical / Trunk Assessment: Kyphotic   Balance Balance Balance Assessed: Yes Dynamic Standing Balance Dynamic Standing - Balance Support: No upper extremity supported Dynamic Standing - Level of Assistance: 4: Min assist Dynamic Standing - Comments: Pt demonstrates decreased dynamic stability and increased sway without UE support, requires assist to prevent fall. with UE assist pt stable and can maintain balance with supervision only.   End of Session PT - End of Session Activity Tolerance: Patient tolerated treatment well Patient left:  in chair;with call bell/phone within reach;with family/visitor present Nurse Communication: Patient requests pain meds  GP     Wilhemina Bonito 03/17/2013, 9:31 AM

## 2013-03-17 NOTE — Progress Notes (Signed)
Patient has not moved into [facility] but plans to move in to the independent side of [facility] after discharge. Clinical Social Worker will sign off for now as social work intervention is no longer needed. Please consult [place] again if new need arises.

## 2013-03-17 NOTE — Evaluation (Signed)
Occupational Therapy Evaluation Patient Details Name: [name] [name] MRN: [id number] DOB: [birth year] Today's Date: 03/17/2013 Time: 4098-1191 OT Time Calculation (min): 27 min  OT Assessment / Plan / Recommendation History of present illness 79 year old male admitted with back pain 6 week(s) ago following a specific injury (lifting supplies from [employer] with pain the next day ).Evaluation revealed T12 burst fx. Pt underwent T9 - L3 posterior spinal fusion     Clinical Impression   Pt presents to OT with decreased I with ADL activity and will benefit from skilled OT to increase I with ADL activity s/p back surgery and return to PLOF    OT Assessment  Patient needs continued OT Services    Follow Up Recommendations  Home health OT;Other (comment) (at ALF)       Equipment Recommendations  None recommended by OT       Frequency  Min 2X/week    Precautions / Restrictions Precautions Precautions: Back Restrictions Weight Bearing Restrictions: No       ADL  Grooming: Simulated;Wash/dry face;Set up Where Assessed - Grooming: Unsupported sitting Upper Body Dressing: Performed;Set up Where Assessed - Upper Body Dressing: Unsupported sitting Lower Body Dressing: Performed;Moderate assistance Where Assessed - Lower Body Dressing: Supported sit to Pharmacist, hospital: Simulated;Moderate assistance Toilet Transfer Method: Sit to stand;Other (comment) (for urinal) Toileting - Clothing Manipulation and Hygiene: Performed;Moderate assistance Where Assessed - Toileting Clothing Manipulation and Hygiene: Standing Equipment Used: Reacher Transfers/Ambulation Related to ADLs: will need futher practice with LB dressing    OT Diagnosis: Generalized weakness;Acute pain  OT Problem List: Decreased activity tolerance;Pain;Decreased knowledge of use of DME or AE;Decreased knowledge of precautions OT Treatment Interventions: Self-care/ADL training;Patient/family education;DME and/or AE  instruction   OT Goals(Current goals can be found in the care plan section) Acute Rehab OT Goals Patient Stated Goal: Get to ALF and take care of wife OT Goal Formulation: With patient Time For Goal Achievement: 03/24/13  Visit Information  Assistance Needed: +1 History of Present Illness: 79 year old male admitted with back pain 6 week(s) ago following a specific injury (lifting supplies from [employer] with pain the next day ).Evaluation revealed T12 burst fx. 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  Communication Communication: No difficulties Dominant Hand: Right         Vision/Perception Vision - History Patient Visual Report: No change from baseline   Cognition  Cognition Arousal/Alertness: Awake/alert Behavior During Therapy: WFL for tasks assessed/performed Overall Cognitive Status: Within Functional Limits for tasks assessed    Extremity/Trunk Assessment Upper Extremity Assessment Upper Extremity Assessment: Overall WFL for tasks assessed Lower Extremity Assessment Lower Extremity Assessment: Generalized weakness Cervical / Trunk Assessment Cervical / Trunk Assessment: Kyphotic     Mobility Bed Mobility Bed Mobility: Not assessed Rolling Right:  4: Min assist Right Sidelying to Sit: 3: Mod assist Details for Bed  Mobility Assistance: Cues for log roll technique. At end of session daughter taught technique and advised on level of assist needed.  Transfers Transfers: Sit to Stand;Stand to Sit Sit to Stand: 3: Mod assist;From chair/3-in-1;With upper extremity assist Stand to Sit: 4: Min assist;To chair/3-in-1;With upper extremity assist Details for Transfer Assistance: Pt very slow with transitions due to pain. Cues for safe UE placement.        Balance Balance Balance Assessed: Yes Dynamic Standing Balance Dynamic Standing - Balance Support: No upper extremity supported Dynamic Standing - Level of Assistance: 4: Min assist Dynamic Standing - Comments: Pt demonstrates decreased dynamic stability and increased sway without UE support, requires assist to prevent fall. with UE assist pt stable and can maintain balance with supervision only.    End of Session OT - End of Session Activity Tolerance: Patient tolerated treatment well Patient left: in chair;with family/visitor present  GO     [name] [name], [name] [name] 03/17/2013, 10:08 AM

## 2013-03-17 NOTE — Progress Notes (Signed)
    Subjective: Procedure(s) (LRB): T9 - L3 POSTERIOR POSTERIOR SPINAL FUSION  (N/A) 1 Day Post-Op  Patient reports pain as 4 on 0-10 scale.  Reports unchanged leg pain reports incisional back pain   Positive void Negative bowel movement Positive flatus Negative chest pain or shortness of breath  Objective: Vital signs in last 24 hours: Temp:  [97.4 F (36.3 C)-98.2 F (36.8 C)] 98.2 F (36.8 C) (07/31 0535) Pulse Rate:  [73-88] 88 (07/31 0535) Resp:  [14-23] 18 (07/31 0535) BP: (107-141)/(52-77) 116/77 mmHg (07/31 0535) SpO2:  [88 %-100 %] 88 % (07/31 0830) FiO2 (%):  [3 %] 3 % (07/31 0535)  Intake/Output from previous day: 07/30 0701 - 07/31 0700 In: 4635 [I.V.:4000; Blood:135; IV Piggyback:500] Out: 2750 [Urine:2250; Blood:500]  Labs:  Recent Labs  03/16/13 1303 03/17/13 0540  WBC  --  10.1  RBC  --  3.19*  HCT 31.0* 28.8*  PLT  --  112*    Recent Labs  03/16/13 1303  NA 142  K 3.3*  GLUCOSE 139*   No results found for this basename: LABPT, INR,  in the last 72 hours  Physical Exam: Neurologically intact ABD soft Neurovascular intact Intact pulses distally Dorsiflexion/Plantar flexion intact Incision: dressing C/D/I and no drainage Compartment soft  Assessment/Plan: Patient stable  xrays satisfactory Continue mobilization with physical therapy Continue care  Advance diet Up with therapy Plan for discharge tomorrow if he remain stable and cleared by PT HCT 28.8 - post-op anemia.  Will start Iron supplement  Venita Lick, MD [facility] [phone]

## 2013-03-17 NOTE — Progress Notes (Signed)
UR COMPLETED  

## 2013-03-18 ENCOUNTER — Encounter (HOSPITAL_COMMUNITY): Payer: Self-pay | Admitting: Orthopedic Surgery

## 2013-03-18 LAB — GLUCOSE, CAPILLARY
Glucose-Capillary: 146 mg/dL — ABNORMAL HIGH (ref 70–99)
Glucose-Capillary: 145 mg/dL — ABNORMAL HIGH (ref 70–99)

## 2013-03-18 MED ORDER — POLYETHYLENE GLYCOL 3350 17 G PO PACK
17.0000 g | PACK | Freq: Every day | ORAL | Status: DC
  Administered 2013-03-18 – 2013-03-22 (×5): 17 g via ORAL
  Filled 2013-03-18 (×5): qty 1

## 2013-03-18 MED ORDER — PANTOPRAZOLE SODIUM 40 MG PO TBEC
40.0000 mg | DELAYED_RELEASE_TABLET | Freq: Every day | ORAL | Status: DC
  Administered 2013-03-18 – 2013-03-22 (×5): 40 mg via ORAL
  Filled 2013-03-18 (×4): qty 1

## 2013-03-18 NOTE — Progress Notes (Signed)
Physical Therapy Treatment Patient Details Name: [name] [name] MRN: [id number] DOB: Dec 28, 1933 Today's Date: 03/18/2013 Time: 4098-1191 PT Time Calculation (min): 29 min  PT Assessment / Plan / Recommendation  History of Present Illness 79 year old male admitted with back pain 6 week(s) ago following a specific injury (lifting supplies from [employer] with pain the next day ).Evaluation revealed T12 burst fx. Pt underwent T9 - L3 posterior spinal fusion     PT Comments   Pt. Is progressing with functional mobility and ambulation but still at min assist level.  He would benefit from additional PT prior to Dc home.    Follow Up Recommendations  Home health PT;Supervision for mobility/OOB     Does the patient have the potential to tolerate intense rehabilitation     Barriers to Discharge        Equipment Recommendations  Rolling walker with 5" wheels    Recommendations for Other Services    Frequency Min 5X/week   Progress towards PT Goals Progress towards PT goals: Progressing toward goals  Plan Current plan remains appropriate    Precautions / Restrictions Precautions Precautions: Back Precaution Comments: pt. able to state 1/3 back precautions.  Reviewed and reminforced all back precautions with pt.; Required Braces or Orthoses: Spinal Brace Spinal Brace: Lumbar corset Restrictions Weight Bearing Restrictions: No   Pertinent Vitals/Pain See vitals tab     Mobility  Bed Mobility Bed Mobility: Rolling Left;Left Sidelying to Sit Rolling Left: 4: Min assist Left Sidelying to Sit: 4: Min assist Details for Bed Mobility Assistance: cues and reminder for need of log rolling technique for back preservation Transfers Transfers: Sit to Stand;Stand to Sit Sit to Stand: 4: Min assist;From chair/3-in-1;With upper extremity assist Stand to Sit: 4: Min assist;To chair/3-in-1;With upper extremity assist Details for Transfer Assistance: needed cues for erect back in standing and needed  min assist to transition to standing/sitting Ambulation/Gait Ambulation/Gait Assistance: 4: Min guard Ambulation Distance (Feet): 150 Feet Assistive device: Rolling walker Ambulation/Gait Assistance Details: several brief standing rests needed during walk; min guard assist for safety and stability Gait Pattern: Step-through pattern;Trunk flexed    Exercises     PT Diagnosis:    PT Problem List:   PT Treatment Interventions:     PT Goals (current goals can now be found in the care plan section)    Visit Information  Last PT Received On: 03/18/13 Assistance Needed: +1 History of Present Illness: 79 year old male admitted with back pain 6 week(s) ago following a specific injury (lifting supplies from [employer] with pain the next day ).Evaluation revealed T12 burst fx. Pt underwent T9 - L3 posterior spinal fusion      Subjective Data      Cognition  Cognition Arousal/Alertness: Awake/alert Behavior During Therapy: WFL for tasks assessed/performed Overall Cognitive Status: Within Functional Limits for tasks assessed    Balance     End of Session PT - End of Session Equipment Utilized During Treatment: Gait belt;Back brace Activity Tolerance: Patient tolerated treatment well Patient left: in chair;with call bell/phone within reach Nurse Communication: Mobility status   GP     Ferman Hamming 03/18/2013, 10:37 AM Weldon Picking PT Acute Rehab Services [phone] Beeper [phone]

## 2013-03-18 NOTE — Anesthesia Postprocedure Evaluation (Signed)
  Anesthesia Post-op Note  Patient: [name] [name]  Procedure(s) Performed: Procedure(s): T9 - L3 POSTERIOR POSTERIOR SPINAL FUSION  (N/A)  Patient Location: PACU and Nursing Unit  Anesthesia Type:General  Level of Consciousness: awake, alert  and oriented  Airway and Oxygen Therapy: Patient Spontanous Breathing  Post-op Pain: mild  Post-op Assessment: Post-op Vital signs reviewed, Patient's Cardiovascular Status Stable, Respiratory Function Stable, Patent Airway, No signs of Nausea or vomiting, NAUSEA AND VOMITING PRESENT and Adequate PO intake   Post-op Vital Signs: Reviewed and stable  Complications: No apparent anesthesia complications

## 2013-03-18 NOTE — Progress Notes (Signed)
    Subjective: Procedure(s) (LRB): T9 - L3 POSTERIOR POSTERIOR SPINAL FUSION  (N/A) 2 Days Post-Op  Patient reports pain as 3 on 0-10 scale.  Reports decreased leg pain reports incisional back pain   Positive void Negative bowel movement Positive flatus Negative chest pain or shortness of breath  Objective: Vital signs in last 24 hours: Temp:  [98.1 F (36.7 C)-98.6 F (37 C)] 98.1 F (36.7 C) (08/01 0534) Pulse Rate:  [83-114] 91 (08/01 0534) Resp:  [16-18] 16 (08/01 0534) BP: (128-154)/(47-74) 128/73 mmHg (08/01 0534) SpO2:  [88 %-100 %] 94 % (08/01 0534)  Intake/Output from previous day: 07/31 0701 - 08/01 0700 In: 1020 [I.V.:1020] Out: 1850 [Urine:1850]  Labs:  Recent Labs  03/16/13 1303 03/17/13 0540  WBC  --  10.1  RBC  --  3.19*  HCT 31.0* 28.8*  PLT  --  112*    Recent Labs  03/16/13 1303  NA 142  K 3.3*  GLUCOSE 139*   No results found for this basename: LABPT, INR,  in the last 72 hours  Physical Exam: Neurologically intact ABD soft Intact pulses distally Incision: dressing C/D/I and no drainage No cellulitis present Compartment soft  Assessment/Plan: Patient stable  xrays satisfactory Continue mobilization with physical therapy Continue care  Advance diet Up with therapy D/C IV fluids Discharge home with home health today or in AM  Venita Lick, MD [facility] [phone]

## 2013-03-18 NOTE — Progress Notes (Signed)
03/18/13 Spoke with patient about HHC. He states that he recently moved to [facility] with his wife who has dementia. I offered to contact his daughter to discuss HHC with her. Patent requested that I leave the list of HHC agencies and he will discuss it with his daughter. CM to follow up to set up home health. Jacquelynn Cree RN, BSN, CCM

## 2013-03-18 NOTE — Progress Notes (Signed)
Occupational Therapy Treatment Patient Details Name: [name] [name] MRN: [id number] DOB: Sep 17, 1933 Today's Date: 03/18/2013 Time: 1191-4782 OT Time Calculation (min): 44 min  OT Assessment / Plan / Recommendation  History of present illness 79 year old male admitted with back pain 6 week(s) ago following a specific injury (lifting supplies from [employer] with pain the next day ).Evaluation revealed T12 burst fx. Pt underwent T9 - L3 posterior spinal fusion     OT comments  Pt appeared alittle "fuzzy-like" with respect to cognition during activities and some difficulty following commands. He was slow to respond to some commands and displayed some ? Behaviors during ADL. (see below). Nursing made aware. 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 Feel SNF is indicated.   Follow Up Recommendations  SNF;Supervision/Assistance - 24 hour    Barriers to Discharge       Equipment Recommendations  3 in 1 bedside comode    Recommendations for Other Services    Frequency Min 2X/week   Progress towards OT Goals Progress towards OT goals: Not progressing toward goals - comment (appears difficulty with some commands today)  Plan Discharge plan needs to be updated    Precautions / Restrictions Precautions Precautions: Back Precaution Comments: pt able to state all precautions today Required Braces or Orthoses: Spinal Brace Spinal Brace: Lumbar corset Restrictions Weight Bearing Restrictions: No   Pertinent Vitals/Pain 7/10; reposition and rest    ADL  Grooming: Performed;Teeth care;Minimal assistance (see notes below) Where Assessed - Grooming: Unsupported standing Lower Body Dressing: Performed;Moderate assistance (with reacher to don underwear. see notes) Where Assessed - Lower Body Dressing: Supported sit to stand Toilet Transfer: Simulated;Minimal assistance Toilet Transfer Method: Other (comment) (with  walker from chair to bathroom to chair) Equipment Used: Reacher;Rolling walker Transfers/Ambulation Related to ADLs: Pt initially answering questions and seemed cognitive clear but later in session noted some ? cognitive issues and not sure if from meds. Pt stood at the sink to brush teeth and started to drink water from pink emesis basin rather than from the cup sitting on counter. He left the toothbrush on the counter without rinsiing it out and still had toothpaste on his mouth and didnt wipe off initially. Pt didn't comment when cup was pointed out to him but did proceed to pick up cup and drink from it. Pt stating he doesnt have 24/7 and he is a caregiver for his wife. States daughter only available a few hours per day. Feel pt is not safe to return home at this time. Pt also having some difficulty following commands with LB dressing especially with the sequence of using the reacher to don underwear. When assisted back to chair, pt wanting wedge placed under his neck but couldn't verbalize how he would like it placed exactly for comfort when asked. Nursing made aware.     OT Diagnosis:    OT Problem List:   OT Treatment Interventions:     OT Goals(current goals can now be found in the care plan section) Acute Rehab OT Goals Patient Stated Goal: didnt state  Visit Information  Last OT Received On: 03/18/13 Assistance Needed: +1 History of Present Illness: 79 year old male admitted with back pain 6 week(s) ago following a specific injury (lifting supplies from [employer] with pain the next day ).Evaluation revealed T12 burst fx. Pt underwent T9 - L3 posterior spinal fusion      Subjective Data  Prior Functioning       Cognition  Cognition Arousal/Alertness: Awake/alert Behavior During Therapy: WFL for tasks assessed/performed Overall Cognitive Status: Impaired/Different from baseline Area of Impairment: Attention;Following commands;Safety/judgement;Problem solving Following Commands:  Follows one step commands inconsistently Safety/Judgement: Decreased awareness of safety Problem Solving: Difficulty sequencing;Requires verbal cues;Slow processing    Mobility   Transfers Transfers: Sit to Stand;Stand to Sit Sit to Stand: 4: Min assist;With upper extremity assist;From chair/3-in-1 Stand to Sit: 4: Min assist;With upper extremity assist;To chair/3-in-1 Details for Transfer Assistance: needed cues for erect back in standing and needed min assist to transition to standing/sitting    Exercises      Balance Dynamic Standing Balance Dynamic Standing - Balance Support: No upper extremity supported Dynamic Standing - Level of Assistance: 4: Min assist (min guard)   End of Session OT - End of Session Equipment Utilized During Treatment: Rolling walker Activity Tolerance: Patient tolerated treatment well Patient left: in chair;with call bell/phone within reach;with chair alarm set  GO     [name] [name] [phone] 03/18/2013, 12:13 PM

## 2013-03-19 LAB — GLUCOSE, CAPILLARY
Glucose-Capillary: 136 mg/dL — ABNORMAL HIGH (ref 70–99)
Glucose-Capillary: 156 mg/dL — ABNORMAL HIGH (ref 70–99)
Glucose-Capillary: 163 mg/dL — ABNORMAL HIGH (ref 70–99)

## 2013-03-19 MED ORDER — HYDROCODONE-ACETAMINOPHEN 5-325 MG PO TABS
1.0000 | ORAL_TABLET | Freq: Four times a day (QID) | ORAL | Status: DC | PRN
  Administered 2013-03-19 – 2013-03-20 (×2): 1 via ORAL
  Administered 2013-03-21 – 2013-03-22 (×2): 2 via ORAL
  Administered 2013-03-22: 1 via ORAL
  Filled 2013-03-19: qty 2
  Filled 2013-03-19 (×3): qty 1
  Filled 2013-03-19: qty 2

## 2013-03-19 MED ORDER — POLYETHYLENE GLYCOL 3350 17 GM/SCOOP PO POWD: 17.0000 g | Freq: Every day | ORAL | Status: DC

## 2013-03-19 MED ORDER — ONDANSETRON HCL 4 MG PO TABS: 4.0000 mg | ORAL_TABLET | Freq: Three times a day (TID) | ORAL | Status: DC | PRN

## 2013-03-19 MED ORDER — DOCUSATE SODIUM 100 MG PO CAPS: 100.0000 mg | ORAL_CAPSULE | Freq: Three times a day (TID) | ORAL | Status: DC | PRN

## 2013-03-19 MED ORDER — HYDROCODONE-ACETAMINOPHEN 5-325 MG PO TABS: 1.0000 | ORAL_TABLET | Freq: Four times a day (QID) | ORAL | Status: DC | PRN

## 2013-03-19 MED ORDER — METHOCARBAMOL 500 MG PO TABS: 500.0000 mg | ORAL_TABLET | Freq: Three times a day (TID) | ORAL | Status: DC | PRN

## 2013-03-19 NOTE — Progress Notes (Signed)
    Subjective: Procedure(s) (LRB): T9 - L3 POSTERIOR POSTERIOR SPINAL FUSION  (N/A) 3 Days Post-Op  Patient reports pain as 2 on 0-10 scale.  Reports decreased leg pain reports incisional back pain   Positive void Positive bowel movement Positive flatus Negative chest pain or shortness of breath  Objective: Vital signs in last 24 hours: Temp:  [97.9 F (36.6 C)-98.8 F (37.1 C)] 97.9 F (36.6 C) (08/02 0543) Pulse Rate:  [83-100] 84 (08/02 0543) Resp:  [16-18] 18 (08/02 0543) BP: (139-156)/(57-74) 149/74 mmHg (08/02 0543) SpO2:  [96 %-98 %] 98 % (08/02 0543)  Intake/Output from previous day: 08/01 0701 - 08/02 0700 In: 3 [I.V.:3] Out: 1025 [Urine:1025]  Labs:  Recent Labs  03/16/13 1303 03/17/13 0540  WBC  --  10.1  RBC  --  3.19*  HCT 31.0* 28.8*  PLT  --  112*    Recent Labs  03/16/13 1303  NA 142  K 3.3*  GLUCOSE 139*   No results found for this basename: LABPT, INR,  in the last 72 hours  Physical Exam: Neurologically intact ABD soft Neurovascular intact Intact pulses distally Incision: dressing C/D/I and no drainage Compartment soft  Assessment/Plan: Patient stable  xrays satisfgactory Continue mobilization with physical therapy Continue care  Advance diet Up with therapy Discharge home with home health Will adjust pain meds for home discharge Patient with confusion at night but is A+O X 3 this AM  If cleared by PT then will d/c to home as long as HHPT/OT is set up.   Venita Lick, MD [facility] [phone]

## 2013-03-19 NOTE — Discharge Summary (Signed)
Patient ID: [name] [name] MRN: [id number] DOB/AGE: [birth year] 79 y.o.  Admit date: 03/16/2013 Discharge date: 03/19/2013  Admission Diagnoses:  Active Problems:   * No active hospital problems. *   Discharge Diagnoses:  Active Problems:   * No active hospital problems. *  status post Procedure(s): T9 - L3 POSTERIOR POSTERIOR SPINAL FUSION   Past Medical History  Diagnosis Date  . Hypertension   . Diabetes mellitus without complication   . Chronic kidney disease     bph  . Arthritis   . Coronary artery disease   . Myocardial infarction     Surgeries: Procedure(s): T9 - L3 POSTERIOR POSTERIOR SPINAL FUSION  on 03/16/2013   Consultants:  none  Discharged Condition: Improved  Hospital Course: [name] [name] is an 79 y.o. male who was admitted 03/16/2013 for operative treatment of <principal problem not specified>. Patient failed conservative treatments (please see the history and physical for the specifics) and had severe unremitting pain that affects sleep, daily activities and work/hobbies. After pre-op clearance, the patient was taken to the operating room on 03/16/2013 and underwent  Procedure(s): T9 - L3 POSTERIOR POSTERIOR SPINAL FUSION .    Patient was given perioperative antibiotics: Anti-infectives   Start     Dose/Rate Route Frequency Ordered Stop   03/16/13 2030  ceFAZolin (ANCEF) IVPB 1 g/50 mL premix     1 g 100 mL/hr over 30 Minutes Intravenous Every 8 hours 03/16/13 1740 03/17/13 0444   03/16/13 1254  vancomycin (VANCOCIN) powder  Status:  Discontinued       As needed 03/16/13 1255 03/16/13 1540   03/15/13 1440  ceFAZolin (ANCEF) IVPB 2 g/50 mL premix     2 g 100 mL/hr over 30 Minutes Intravenous 30 min pre-op 03/15/13 1440 03/16/13 1245       Patient was given sequential compression devices and early ambulation to prevent DVT.   Patient benefited maximally from hospital stay and there were no complications. At the time of discharge, the patient  was urinating/moving their bowels without difficulty, tolerating a regular diet, pain is controlled with oral pain medications and they have been cleared by PT/OT.   Recent vital signs: Patient Vitals for the past 24 hrs:  BP Temp Temp src Pulse Resp SpO2  03/19/13 0543 149/74 mmHg 97.9 F (36.6 C) Oral 84 18 98 %  03/18/13 2028 139/66 mmHg 98.2 F (36.8 C) Oral 83 18 97 %  03/18/13 1300 153/57 mmHg 98.8 F (37.1 C) - 89 16 96 %  03/18/13 0941 156/60 mmHg - - 100 - -     Recent laboratory studies:  Recent Labs  03/16/13 1303 03/17/13 0540  WBC  --  10.1  HGB 10.5* 10.3*  HCT 31.0* 28.8*  PLT  --  112*  NA 142  --   K 3.3*  --   GLUCOSE 139*  --      Discharge Medications:     Medication List         brimonidine 0.2 % ophthalmic solution  Commonly known as:  ALPHAGAN  Place 1 drop into both eyes 2 (two) times daily.     calcium-vitamin D 250-125 MG-UNIT per tablet  Commonly known as:  OSCAL WITH D  Take 2 tablets by mouth 2 (two) times daily.     cholecalciferol 1000 UNITS tablet  Commonly known as:  VITAMIN D  Take 1,000 Units by mouth daily.     CoQ-10 100 MG Caps  Take 200 mg by  mouth daily.     docusate sodium 100 MG capsule  Commonly known as:  COLACE  Take 1 capsule (100 mg total) by mouth 3 (three) times daily as needed for constipation.     fish oil-omega-3 fatty acids 1000 MG capsule  Take 1 g by mouth daily.     glipiZIDE 5 MG tablet  Commonly known as:  GLUCOTROL  Take 2.5 mg by mouth daily.     glucosamine-chondroitin 500-400 MG tablet  Take 1 tablet by mouth daily.     HYDROcodone-acetaminophen 5-325 MG per tablet  Commonly known as:  NORCO/VICODIN  Take 1 tablet by mouth every 6 (six) hours as needed for pain.     losartan 100 MG tablet  Commonly known as:  COZAAR  Take 100 mg by mouth daily.     metFORMIN 1000 MG tablet  Commonly known as:  GLUCOPHAGE  Take 1,000 mg by mouth daily.     methocarbamol 500 MG tablet  Commonly known  as:  ROBAXIN  Take 1 tablet (500 mg total) by mouth 3 (three) times daily as needed.     multivitamin with minerals tablet  Take 1 tablet by mouth daily.     ondansetron 4 MG tablet  Commonly known as:  ZOFRAN  Take 1 tablet (4 mg total) by mouth every 8 (eight) hours as needed for nausea.     polyethylene glycol powder powder  Commonly known as:  GLYCOLAX  Take 17 g by mouth daily.     simvastatin 20 MG tablet  Commonly known as:  ZOCOR  Take 10 mg by mouth every evening.     tamsulosin 0.4 MG Caps  Commonly known as:  FLOMAX  Take 0.4 mg by mouth daily.     vitamin B-6 500 MG tablet  Take 500 mg by mouth daily.        Diagnostic Studies: Dg Chest 2 View  03/08/2013   *RADIOLOGY REPORT*  Clinical Data: Preop low back surgery.  Hypertension.  CHEST - 2 VIEW  Comparison: None.  Findings: Moderate sized hiatal hernia.  Linear densities at the left base, likely scarring.  Right lung is clear.  Heart is borderline in size.  No effusions.  Severe compression fracture in the lower thoracic spine, likely T12 with associated kyphosis.  Prior median sternotomy and CABG.  IMPRESSION: Left basilar scarring.  Borderline heart size.  Moderate sized hiatal hernia.  Severe T12 compression fracture.   Original Report Authenticated By: Charlett Nose, M.D.   Dg Thoracolumabar Spine  03/16/2013   *RADIOLOGY REPORT*  Clinical Data: T9-L3 fusion.  THORACOLUMBAR SPINE - 2 VIEW  Comparison: No previous lateral pharyngeal 03/16/2013.  Findings: Changes of posterior fusion from T9-L3.  This crosses the severe T12 compression fracture.  Normal alignment.  No hardware or bony complicating feature.  IMPRESSION: T9-L3 posterior fusion.   Original Report Authenticated By: Charlett Nose, M.D.   Dg Thoracolumabar Spine  03/16/2013   *RADIOLOGY REPORT*  Clinical Data: T9-L3 fusion.  THORACOLUMBAR SPINE - 2 VIEW  Comparison: 03/08/2013  Findings: Two portable cross-table lateral views of the thoracolumbar spine  demonstrate changes of posterior fusion from T9- L3 across the severe T12 compression fracture.  No hardware complicating feature.  Normal alignment.  IMPRESSION: Posterior fusion from T9-L3.  No visible complicating feature on this cross-table lateral view.   Original Report Authenticated By: Charlett Nose, M.D.   Dg Lumbar Spine 2-3 Views  03/08/2013   *RADIOLOGY REPORT*  Clinical Data: Preop fusion.  Low back pain.  LUMBAR SPINE - 2-3 VIEW  Comparison: 12/23/2012  Findings: Severe compression fracture again noted at T12, slightly progressed since prior study.  Associated kyphosis at this level.  Diffuse degenerative disc changes throughout the lumbar spine. Degenerative facet disease in the mid and lower lumbar spine.  No acute bony abnormality.  Aortic and iliac calcifications are present without visible aneurysm.  IMPRESSION: Severe T12 compression fracture, slightly progressed.  Degenerative disc and facet disease.   Original Report Authenticated By: Charlett Nose, M.D.   Dg C-arm Gt 120 Min-no Report  03/16/2013   CLINICAL DATA: T9-L3 fusion   C-ARM GT 120 MINUTE  Fluoroscopy was utilized by the requesting physician.  No radiographic  interpretation.           Follow-up Information   Follow up with Alvy Beal, MD. Schedule an appointment as soon as possible for a visit in 2 weeks.   Contact information:   [address] [place] [address] [phone]       Discharge Plan:  discharge to home with HHS  Disposition: stable    Signed: Maryclare Nydam D for Dr. Venita Lick [facility] [phone] 03/19/2013, 9:01 AM

## 2013-03-19 NOTE — Progress Notes (Signed)
Occupational Therapy Treatment Patient Details Name: [name] [name] MRN: [id number] DOB: 03-08-1934 Today's Date: 03/19/2013 Time: 2440-1027 OT Time Calculation (min): 28 min  OT Assessment / Plan / Recommendation  History of present illness 79 year old male admitted with back pain 6 week(s) ago following a specific injury (lifting supplies from [employer] with pain the next day ).Evaluation revealed T12 burst fx. Pt underwent T9 - L3 posterior spinal fusion     OT comments  Practiced with reacher and sockaid for donning/doffing sock, and also reviewed AE in kit to use for ADLs. Practiced toilet transfer and donning brace. Need to ensure daughter can assist at home as needed. Pt states she will not be with him 24/7.   Follow Up Recommendations  SNF;Supervision/Assistance - 24 hour    Barriers to Discharge       Equipment Recommendations  3 in 1 bedside comode    Recommendations for Other Services    Frequency Min 2X/week   Progress towards OT Goals Progress towards OT goals: Progressing toward goals  Plan Discharge plan remains appropriate    Precautions / Restrictions Precautions Precautions: Back Precaution Comments: Patient able to state all precautions with increased time Required Braces or Orthoses: Spinal Brace Spinal Brace: Lumbar corset Restrictions Weight Bearing Restrictions: No   Pertinent Vitals/Pain Pain 6/10 at end of session. Nurse notified. Repositioned.     ADL  Lower Body Dressing: Minimal assistance (practiced donning/doffing sock) Where Assessed - Lower Body Dressing: Supported sitting;Unsupported sitting Toilet Transfer: Performed;Min guard Statistician Method: Sit to Barista: Raised toilet seat with arms (or 3-in-1 over toilet) Toileting - Clothing Manipulation and Hygiene: Moderate assistance Where Assessed - Toileting Clothing Manipulation and Hygiene: Sit to stand from 3-in-1 or toilet Equipment Used: Back brace;Rolling  walker;Sock aid;Reacher;Long-handled shoe horn;Long-handled sponge Transfers/Ambulation Related to ADLs: Minguard  ADL Comments: Pt practiced toilet transfer. Pt unable to fully reach behind to perform hygiene, so OT educated on use of toilet aid to assist with this. Pt practiced with reacher and sockaid donning/doffing sock at Min A level.  Cues to maintain precautions. Pt did practice attaching brace in front while it was already on his back. Cues to maintain precautions during session.    OT Diagnosis:    OT Problem List:   OT Treatment Interventions:     OT Goals(current goals can now be found in the care plan section) Acute Rehab OT Goals Patient Stated Goal: go home OT Goal Formulation: With patient Time For Goal Achievement: 03/24/13 ADL Goals Pt Will Perform Grooming: with supervision;standing Pt Will Perform Upper Body Dressing: with modified independence;sitting Pt Will Perform Lower Body Dressing: with modified independence;sit to/from stand Pt Will Transfer to Toilet: with modified independence;grab bars Pt Will Perform Toileting - Clothing Manipulation and hygiene: with modified independence;sit to/from stand Pt Will Perform Tub/Shower Transfer: with supervision;grab bars Additional ADL Goal #1: Pt will recall and demonstrate safe of use of AE during bathing and dressing to follow back precautions  Visit Information  Last OT Received On: 03/19/13 Assistance Needed: +1 PT/OT Co-Evaluation/Treatment: Yes History of Present Illness: 79 year old male admitted with back pain 6 week(s) ago following a specific injury (lifting supplies from [employer] with pain the next day ).Evaluation revealed T12 burst fx. Pt underwent T9 - L3 posterior spinal fusion      Subjective Data      Prior Functioning       Cognition  Cognition Arousal/Alertness: Awake/alert Behavior During Therapy: WFL for tasks assessed/performed  Overall Cognitive Status: Within Functional Limits for tasks  assessed    Mobility  Bed Mobility Bed Mobility: Not assessed Transfers Transfers: Sit to Stand;Stand to Sit Sit to Stand: 4: Min guard;With upper extremity assist;From chair/3-in-1 Stand to Sit: To chair/3-in-1;4: Min guard;With upper extremity assist Details for Transfer Assistance: Stood x2 with cues for correct hand placement    Exercises      Balance     End of Session OT - End of Session Equipment Utilized During Treatment: Rolling walker;Back brace Activity Tolerance: Patient tolerated treatment well Patient left: in chair;with call bell/phone within reach Nurse Communication: Other (comment) (pain level)  GO     Earlie Raveling OTR/L [phone] 03/19/2013, 1:30 PM

## 2013-03-19 NOTE — Progress Notes (Signed)
Physical Therapy Treatment Patient Details Name: [name] [name] MRN: [id number] DOB: 02/08/1934 Today's Date: 03/19/2013 Time: 6213-0865 PT Time Calculation (min): 24 min  PT Assessment / Plan / Recommendation  History of Present Illness 79 year old male admitted with back pain 6 week(s) ago following a specific injury (lifting supplies from [employer] with pain the next day ).Evaluation revealed T12 burst fx. Pt underwent T9 - L3 posterior spinal fusion     PT Comments   Patient cognition a little better today and able to participate well. Patient able to don brace with set up and cueing. Patient refusing SNF. Will need to ensure daughter can assist at home as needed. Anticipate DC soon but patient not ready today based on goals set on eval  Follow Up Recommendations  Home health PT;Supervision for mobility/OOB     Does the patient have the potential to tolerate intense rehabilitation     Barriers to Discharge        Equipment Recommendations  Rolling walker with 5" wheels    Recommendations for Other Services    Frequency Min 5X/week   Progress towards PT Goals Progress towards PT goals: Progressing toward goals  Plan Current plan remains appropriate    Precautions / Restrictions Precautions Precautions: Back Precaution Comments: Patient able to state all precautions with increased time Required Braces or Orthoses: Spinal Brace Spinal Brace: Lumbar corset   Pertinent Vitals/Pain denied    Mobility  Bed Mobility Bed Mobility: Not assessed Transfers Sit to Stand: 4: Min guard;With upper extremity assist;From chair/3-in-1 Stand to Sit: To chair/3-in-1;4: Min guard;With upper extremity assist Details for Transfer Assistance: Stood x2 with cues for correct hand placement Ambulation/Gait Ambulation/Gait Assistance: 4: Min guard Ambulation Distance (Feet): 200 Feet Assistive device: Rolling walker Ambulation/Gait Assistance Details: Cues for upright posture and positioning  inside of RW Gait Pattern: Step-through pattern;Trunk flexed    Exercises     PT Diagnosis:    PT Problem List:   PT Treatment Interventions:     PT Goals (current goals can now be found in the care plan section)    Visit Information  Last PT Received On: 03/19/13 Assistance Needed: +1 History of Present Illness: 79 year old male admitted with back pain 6 week(s) ago following a specific injury (lifting supplies from [employer] with pain the next day ).Evaluation revealed T12 burst fx. Pt underwent T9 - L3 posterior spinal fusion      Subjective Data      Cognition  Cognition Arousal/Alertness: Awake/alert Behavior During Therapy: WFL for tasks assessed/performed Overall Cognitive Status: Within Functional Limits for tasks assessed    Balance     End of Session PT - End of Session Equipment Utilized During Treatment: Gait belt;Back brace Activity Tolerance: Patient tolerated treatment well Patient left: in chair;with call bell/phone within reach Nurse Communication: Mobility status   GP     Fredrich Birks 03/19/2013, 1:12 PM 03/19/2013 Fredrich Birks PTA [phone] pager [phone] office

## 2013-03-20 LAB — GLUCOSE, CAPILLARY
Glucose-Capillary: 192 mg/dL — ABNORMAL HIGH (ref 70–99)
Glucose-Capillary: 127 mg/dL — ABNORMAL HIGH (ref 70–99)
Glucose-Capillary: 110 mg/dL — ABNORMAL HIGH (ref 70–99)

## 2013-03-20 MED ORDER — INSULIN ASPART 100 UNIT/ML ~~LOC~~ SOLN: 0.0000 [IU] | Freq: Every day | SUBCUTANEOUS | Status: DC

## 2013-03-20 MED ORDER — SIMETHICONE 80 MG PO CHEW
80.0000 mg | CHEWABLE_TABLET | Freq: Four times a day (QID) | ORAL | Status: DC | PRN
  Administered 2013-03-20 – 2013-03-21 (×3): 80 mg via ORAL
  Filled 2013-03-20 (×3): qty 1

## 2013-03-20 MED ORDER — POLYETHYLENE GLYCOL 3350 17 G PO PACK: 17.0000 g | PACK | Freq: Every day | ORAL | Status: DC

## 2013-03-20 MED ORDER — INSULIN ASPART 100 UNIT/ML ~~LOC~~ SOLN: 0.0000 [IU] | Freq: Three times a day (TID) | SUBCUTANEOUS | Status: DC

## 2013-03-20 NOTE — Progress Notes (Signed)
Occupational Therapy Treatment Patient Details Name: [name] [name] MRN: [id number] DOB: Jan 13, 1934 Today's Date: 03/20/2013 Time: 4098-1191 OT Time Calculation (min): 27 min  OT Assessment / Plan / Recommendation  History of present illness 79 year old male admitted with back pain 6 week(s) ago following a specific injury (lifting supplies from [employer] with pain the next day ).Evaluation revealed T12 burst fx. Pt underwent T9 - L3 posterior spinal fusion     OT comments  Practiced with AE for LB ADLs, simulated shower transfer, and donning brace. Pt moving slowly and at minguard level for transfers and ambulation and states he will not have 24/7 assist at home. OT still recommending SNF due to this, but if pt goes home, he will need HHOT.   Follow Up Recommendations  SNF;Supervision/Assistance - 24 hour    Barriers to Discharge       Equipment Recommendations  3 in 1 bedside comode    Recommendations for Other Services    Frequency Min 2X/week   Progress towards OT Goals Progress towards OT goals: Progressing toward goals  Plan Discharge plan remains appropriate    Precautions / Restrictions Precautions Precautions: Back Precaution Comments: Patient able to state all precautions with increased time Required Braces or Orthoses: Spinal Brace Spinal Brace: Lumbar corset Restrictions Weight Bearing Restrictions: No   Pertinent Vitals/Pain Pain in back, but not rated.     ADL  Lower Body Dressing: Performed;Min guard Where Assessed - Lower Body Dressing: Supported sit to stand Toilet Transfer: Simulated;Min Pension scheme manager Method: Sit to Barista: Raised toilet seat with arms (or 3-in-1 over toilet) Tub/Shower Transfer: Simulated;Minimal assistance Tub/Shower Transfer Method: Science writer: Walk in shower;Other (comment) (3 in 1) Equipment Used: Back brace;Rolling walker;Sock aid;Reacher;Long-handled shoe  horn;Long-handled sponge Transfers/Ambulation Related to ADLs: Minguard ADL Comments: Practiced donning brace- cues to maintain precautions and Min A. Pt practiced simulated shower transfer at Min A level for techique and to maneuver walker and balance-educated to wait and perform this with HHOT. Pt agreaable. Pt practiced with sockaid and reacher to don sock and also don underwear. Cues to maintain precautions- Minguard level.  Explained how the other pieces of AE work for LB ADLs. Also, reminded him to get family to purchase toilet aid-verbalized that they can do that for him.      OT Diagnosis:    OT Problem List:   OT Treatment Interventions:     OT Goals(current goals can now be found in the care plan section) Acute Rehab OT Goals Patient Stated Goal: go home OT Goal Formulation: With patient Time For Goal Achievement: 03/24/13 ADL Goals Pt Will Perform Grooming: with supervision;standing Pt Will Perform Upper Body Dressing: with modified independence;sitting Pt Will Perform Lower Body Dressing: with modified independence;sit to/from stand Pt Will Transfer to Toilet: with modified independence;grab bars Pt Will Perform Toileting - Clothing Manipulation and hygiene: with modified independence;sit to/from stand Pt Will Perform Tub/Shower Transfer: with supervision;grab bars Additional ADL Goal #1: Pt will recall and demonstrate safe of use of AE during bathing and dressing to follow back precautions  Visit Information  Last OT Received On: 03/20/13 Assistance Needed: +1 History of Present Illness: 79 year old male admitted with back pain 6 week(s) ago following a specific injury (lifting supplies from [employer] with pain the next day ).Evaluation revealed T12 burst fx. Pt underwent T9 - L3 posterior spinal fusion      Subjective Data      Prior Functioning  Cognition  Cognition Arousal/Alertness: Awake/alert Behavior During Therapy: WFL for tasks assessed/performed Overall  Cognitive Status: Within Functional Limits for tasks assessed    Mobility  Bed Mobility Bed Mobility: Not assessed Transfers Transfers: Sit to Stand;Stand to Sit Sit to Stand: 4: Min guard;With upper extremity assist;From chair/3-in-1 Stand to Sit: 4: Min guard;With upper extremity assist;To chair/3-in-1 Details for Transfer Assistance: cues for hand placement and to keep back straight    Exercises      Balance     End of Session OT - End of Session Equipment Utilized During Treatment: Gait belt;Rolling walker;Back brace Activity Tolerance: Patient tolerated treatment well Patient left: in chair;with call bell/phone within reach  [employer] OTR/L [phone] 03/20/2013, 2:03 PM

## 2013-03-20 NOTE — Progress Notes (Signed)
Physical Therapy Treatment Patient Details Name: [name] [name] MRN: [id number] DOB: May 20, 1934 Today's Date: 03/20/2013 Time: 0902-0921 PT Time Calculation (min): 19 min  PT Assessment / Plan / Recommendation  History of Present Illness 79 year old male admitted with back pain 6 week(s) ago following a specific injury (lifting supplies from [employer] with pain the next day ).Evaluation revealed T12 burst fx. Pt underwent T9 - L3 posterior spinal fusion     PT Comments   Pt agreeable to participate in PT session today but appears a little depressed by making remarks such as "I just dont want to hurt anymore & there's only one way to ensure I dont hurt" & "it doesn't matter anymore" in response to asking him what type of things he enjoys doing.     Follow Up Recommendations  Home health PT;Supervision for mobility/OOB     Does the patient have the potential to tolerate intense rehabilitation     Barriers to Discharge        Equipment Recommendations  Rolling walker with 5" wheels    Recommendations for Other Services    Frequency Min 5X/week   Progress towards PT Goals Progress towards PT goals: Progressing toward goals  Plan Current plan remains appropriate    Precautions / Restrictions Precautions Precautions: Back Precaution Comments: Patient able to state all precautions with increased time Required Braces or Orthoses: Spinal Brace Spinal Brace: Lumbar corset Restrictions Weight Bearing Restrictions: No   Pertinent Vitals/Pain Back pain but never rated.  RN notified for pain medication.      Mobility  Bed Mobility Bed Mobility: Rolling Right;Right Sidelying to Sit;Sitting - Scoot to Edge of Bed Rolling Right: 4: Min guard;With rail Right Sidelying to Sit: 4: Min guard;HOB flat;With rails Sitting - Scoot to Edge of Bed: 4: Min guard Details for Bed Mobility Assistance: cues for sequencing & technique.  Incr. time  Transfers Transfers: Sit to Stand;Stand to Sit Sit to  Stand: 4: Min guard;With upper extremity assist;From bed Stand to Sit: 4: Min guard;With upper extremity assist;With armrests;To chair/3-in-1 Details for Transfer Assistance: cues for safest hand placement & technique.   Ambulation/Gait Ambulation/Gait Assistance: 4: Min guard Ambulation Distance (Feet): 140 Feet Assistive device: Rolling walker Ambulation/Gait Assistance Details: Cues for tall posture, stay closer to RW.  Pt with tense/guarded posture 2/2 pain.  Gait Pattern: Step-through pattern;Decreased stride length;Trunk flexed (decreased step height) Gait velocity: decreased Stairs: No Wheelchair Mobility Wheelchair Mobility: No     PT Goals (current goals can now be found in the care plan section) Acute Rehab PT Goals Patient Stated Goal: go home PT Goal Formulation: With patient Time For Goal Achievement: 03/24/13 Potential to Achieve Goals: Good  Visit Information  Last PT Received On: 03/20/13 Assistance Needed: +1 History of Present Illness: 79 year old male admitted with back pain 6 week(s) ago following a specific injury (lifting supplies from [employer] with pain the next day ).Evaluation revealed T12 burst fx. Pt underwent T9 - L3 posterior spinal fusion      Subjective Data  Subjective: "I just want to quit hurting" Patient Stated Goal: go home   Cognition  Cognition Arousal/Alertness: Awake/alert Behavior During Therapy: WFL for tasks assessed/performed Overall Cognitive Status: Within Functional Limits for tasks assessed    Balance     End of Session PT - End of Session Equipment Utilized During Treatment: Gait belt;Back brace Activity Tolerance: Patient tolerated treatment well Patient left: in chair;with call bell/phone within reach Nurse Communication: Mobility status  GP     Lara Mulch 03/20/2013, 11:05 AM   Verdell Face, PTA [phone] 03/20/2013

## 2013-03-20 NOTE — Progress Notes (Signed)
Subjective: 4 Days Post-Op Procedure(s) (LRB): T9 - L3 POSTERIOR POSTERIOR SPINAL FUSION  (N/A) Patient reports pain as 5 on 0-10 scale.   Only c/o burning with swallowing. No CP nor SOB. Objective: Vital signs in last 24 hours: Temp:  [98.1 F (36.7 C)-98.5 F (36.9 C)] 98.3 F (36.8 C) (08/03 0542) Pulse Rate:  [78-84] 84 (08/03 0542) Resp:  [18] 18 (08/03 0542) BP: (123-133)/(47-56) 133/56 mmHg (08/03 0542) SpO2:  [96 %-99 %] 96 % (08/03 0542)  Intake/Output from previous day: 08/02 0701 - 08/03 0700 In: 1083 [P.O.:1080; I.V.:3] Out: 850 [Urine:850] Intake/Output this shift: Total I/O In: 240 [P.O.:240] Out: -   No results found for this basename: HGB,  in the last 72 hours No results found for this basename: WBC, RBC, HCT, PLT,  in the last 72 hours No results found for this basename: NA, K, CL, CO2, BUN, CREATININE, GLUCOSE, CALCIUM,  in the last 72 hours No results found for this basename: LABPT, INR,  in the last 72 hours  Intact pulses distally up in chair with brace passing flatus better.  Assessment/Plan: 4 Days Post-Op Procedure(s) (LRB): T9 - L3 POSTERIOR POSTERIOR SPINAL FUSION  (N/A) Discharge home with home health Added simethicone prn will see how that does.Continue therapies.  Varick Keys ANDREW 03/20/2013, 10:10 AM

## 2013-03-21 LAB — GLUCOSE, CAPILLARY
Glucose-Capillary: 142 mg/dL — ABNORMAL HIGH (ref 70–99)
Glucose-Capillary: 157 mg/dL — ABNORMAL HIGH (ref 70–99)

## 2013-03-21 MED ORDER — FLEET ENEMA 7-19 GM/118ML RE ENEM: 1.0000 | ENEMA | Freq: Once | RECTAL | Status: DC

## 2013-03-21 MED ORDER — MAGNESIUM CITRATE PO SOLN
0.5000 | Freq: Once | ORAL | Status: AC
  Administered 2013-03-21: 0.5 via ORAL
  Filled 2013-03-21: qty 296

## 2013-03-21 MED ORDER — BISACODYL 10 MG RE SUPP
10.0000 mg | Freq: Every day | RECTAL | Status: DC | PRN
  Administered 2013-03-22: 10 mg via RECTAL
  Filled 2013-03-21: qty 1

## 2013-03-21 NOTE — Progress Notes (Signed)
03/21/2013 1730 Received call back from dtr. States her parents just moved into [facility] [place] on 8[place]1. She is scheduled to leave on 8/8-8/21 out of town. She has discussed with her brother in [place] to assist with parents at dc. [place] and pt is her primary caregiver. Dtr will discuss with [facility] Coordinator what services are available for pt post dc. SW working on possible SNF placement at [facility] for rehab.  Isidoro Donning RN CCM Case Mgmt phone [phone]

## 2013-03-21 NOTE — Progress Notes (Signed)
Physical Therapy Treatment Patient Details Name: Oval Cavazos MRN: [id number] DOB: [birth year] Today's Date: 03/21/2013 Time: 7829-5621 PT Time Calculation (min): 18 min  PT Assessment / Plan / Recommendation  History of Present Illness 79 year old male admitted with back pain 6 week(s) ago following a specific injury (lifting supplies from [employer] with pain the next day ).Evaluation revealed T12 burst fx. Pt underwent T9 - L3 posterior spinal fusion     PT Comments   Patient making good progress with ambulation. Still requiring cues for bed mobility and standing. Patient refusing SNF and does not have 24 hour assistance at home. Patient able to don brace independently while following back precautions.   Follow Up Recommendations  Home health PT;Supervision for mobility/OOB     Does the patient have the potential to tolerate intense rehabilitation     Barriers to Discharge        Equipment Recommendations  Rolling walker with 5" wheels    Recommendations for Other Services    Frequency Min 5X/week   Progress towards PT Goals Progress towards PT goals: Progressing toward goals  Plan Current plan remains appropriate    Precautions / Restrictions Precautions Precautions: Back Precaution Comments: Patient able to state all precautions with increased time Required Braces or Orthoses: Spinal Brace Spinal Brace: Lumbar corset   Pertinent Vitals/Pain no apparent distress     Mobility  Bed Mobility Rolling Right: 4: Min guard;With rail Right Sidelying to Sit: 4: Min guard;HOB flat;With rails Details for Bed Mobility Assistance: Cues for log roll still needed. Relys on rails to sit up Transfers Sit to Stand: 4: Min guard;With upper extremity assist;From bed Stand to Sit: 4: Min guard;With upper extremity assist;To chair/3-in-1 Details for Transfer Assistance: Cues for hand placement Ambulation/Gait Ambulation/Gait Assistance: 4: Min guard Ambulation Distance (Feet): 250  Feet Assistive device: Rolling walker Ambulation/Gait Assistance Details: Patient with better posture and staying close to RW this morning. Cued to take turns without picking up RW Gait Pattern: Step-through pattern;Decreased stride length Gait velocity: increasing    Exercises     PT Diagnosis:    PT Problem List:   PT Treatment Interventions:     PT Goals (current goals can now be found in the care plan section)    Visit Information  Last PT Received On: 03/21/13 Assistance Needed: +1 History of Present Illness: 79 year old male admitted with back pain 6 week(s) ago following a specific injury (lifting supplies from [employer] with pain the next day ).Evaluation revealed T12 burst fx. Pt underwent T9 - L3 posterior spinal fusion      Subjective Data      Cognition  Cognition Arousal/Alertness: Awake/alert Behavior During Therapy: WFL for tasks assessed/performed Overall Cognitive Status: Within Functional Limits for tasks assessed    Balance     End of Session PT - End of Session Equipment Utilized During Treatment: Gait belt;Back brace Activity Tolerance: Patient tolerated treatment well Patient left: in chair;with call bell/phone within reach Nurse Communication: Mobility status   GP     Fredrich Birks 03/21/2013, 8:17 AM 03/21/2013 Fredrich Birks PTA [phone] pager [phone] office

## 2013-03-21 NOTE — Progress Notes (Signed)
03/21/2013 1555 Spoke to pt and he is requesting [facility] for [facility]. Will follow up with dtr, [name] # [phone]. Left message on voice mail. Wanted to confirm dc plan to home. Lives at home with wife. He is caregiver for his wife. Isidoro Donning RN CCM Case Mgmt phone [phone]

## 2013-03-21 NOTE — Progress Notes (Addendum)
Occupational Therapy Treatment Patient Details Name: [name] [name] MRN: [id number] DOB: [birth year] Today's Date: 03/21/2013 Time: 9147-8295 OT Time Calculation (min): 28 min  OT Assessment / Plan / Recommendation  History of present illness 79 year old male admitted with back pain 6 week(s) ago following a specific injury (lifting supplies from [employer] with pain the next day ).Evaluation revealed T12 burst fx. Pt underwent T9 - L3 posterior spinal fusion     OT comments  Practiced bed mobility, toilet transfer, and donning brace. Do not feel patient is safe to d/c home at this level. Spoke with patient about option of SNF and patient is open to idea, but is concerned with the care of his wife (with dementia). Pt states no one will be with him 24/7 and daughter is going on trip soon. Spoke with nurse and charge nurse about situation and will also speak to social worker about it.   Follow Up Recommendations  SNF;Supervision/Assistance - 24 hour    Barriers to Discharge       Equipment Recommendations  3 in 1 bedside comode    Recommendations for Other Services    Frequency Min 2X/week   Progress towards OT Goals Progress towards OT goals: Progressing toward goals  Plan Discharge plan remains appropriate    Precautions / Restrictions Precautions Precautions: Back Precaution Comments: Patient able to state all precautions Required Braces or Orthoses: Spinal Brace Spinal Brace: Lumbar corset Restrictions Weight Bearing Restrictions: No   Pertinent Vitals/Pain Pain 6/10 in right lower back. Repositioned. Increased activity.     ADL  Toilet Transfer: Min Pension scheme manager Method: Sit to Barista: Raised toilet seat with arms (or 3-in-1 over toilet) Equipment Used: Gait belt;Back brace;Rolling walker Transfers/Ambulation Related to ADLs: Minguard level ADL Comments: Practiced toilet transfer and ambulated in hallway. Also, practiced bed mobility. Pt  required assistance to don back brace in front- attaching straps (min/mod A).     OT Diagnosis:    OT Problem List:   OT Treatment Interventions:     OT Goals(current goals can now be found in the care plan section) Acute Rehab OT Goals Patient Stated Goal: get home to take care of wife OT Goal Formulation: With patient Time For Goal Achievement: 03/24/13 ADL Goals Pt Will Perform Grooming: with supervision;standing Pt Will Perform Upper Body Dressing: with modified independence;sitting Pt Will Perform Lower Body Dressing: with modified independence;sit to/from stand Pt Will Transfer to Toilet: with modified independence;grab bars Pt Will Perform Toileting - Clothing Manipulation and hygiene: with modified independence;sit to/from stand Pt Will Perform Tub/Shower Transfer: with supervision;grab bars Additional ADL Goal #1: Pt will recall and demonstrate safe of use of AE during bathing and dressing to follow back precautions  Visit Information  Last OT Received On: 03/21/13 Assistance Needed: +1 History of Present Illness: 79 year old male admitted with back pain 6 week(s) ago following a specific injury (lifting supplies from [employer] with pain the next day ).Evaluation revealed T12 burst fx. Pt underwent T9 - L3 posterior spinal fusion      Subjective Data      Prior Functioning       Cognition  Cognition Arousal/Alertness: Awake/alert Behavior During Therapy: WFL for tasks assessed/performed Overall Cognitive Status: Within Functional Limits for tasks assessed    Mobility  Bed Mobility Bed Mobility: Sit to Sidelying Right;Rolling Left;Rolling Right;Right Sidelying to Sit Rolling Right: 3: Mod assist Rolling Left: 3: Mod assist Right Sidelying to Sit: 3: Mod assist;HOB flat Sit to  Sidelying Right: 3: Mod assist Details for Bed Mobility Assistance: Practiced bed mobility without rails and pt requiring Mod A. Pt with increased pain. Cues for technique and to maintain  precautions.  Transfers Transfers: Sit to Stand;Stand to Sit Sit to Stand: 4: Min guard;With upper extremity assist;From chair/3-in-1 Stand to Sit: 4: Min guard;With upper extremity assist;To bed;To chair/3-in-1 Details for Transfer Assistance: cues for hand placement and to keep back straight.    Exercises      Balance     End of Session OT - End of Session Equipment Utilized During Treatment: Gait belt;Rolling walker;Back brace Activity Tolerance: Patient limited by pain Patient left: in bed;with call bell/phone within reach Nurse Communication: Mobility status  GO     Earlie Raveling OTR/L [phone] 03/21/2013, 12:39 PM

## 2013-03-21 NOTE — Progress Notes (Signed)
Discharge for patient was pending PT clearance on Saturday.  However on Sunday MD collins rounded but patient was still not discharged.I notified Dr. Victorino Dike today to let him know that discharge summary was written and per MD notes patient probably should have been discharged prior to today. He stated that he would let Dr. Shon Baton know that patient was still on the floor.

## 2013-03-22 LAB — GLUCOSE, CAPILLARY
Glucose-Capillary: 128 mg/dL — ABNORMAL HIGH (ref 70–99)
Glucose-Capillary: 146 mg/dL — ABNORMAL HIGH (ref 70–99)

## 2013-03-22 NOTE — Progress Notes (Signed)
    Subjective: Procedure(s) (LRB): T9 - L3 POSTERIOR POSTERIOR SPINAL FUSION  (N/A) 6 Days Post-Op  Patient reports pain as 2 on 0-10 scale.  Reports decreased leg pain reports incisional back pain   Positive void Positive bowel movement Positive flatus Negative chest pain or shortness of breath  Objective: Vital signs in last 24 hours: Temp:  [97.6 F (36.4 C)-98.6 F (37 C)] 98 F (36.7 C) (08/05 1308) Pulse Rate:  [72-77] 77 (08/05 1308) Resp:  [18] 18 (08/05 1308) BP: (131-145)/(55-60) 131/60 mmHg (08/05 1308) SpO2:  [97 %-99 %] 97 % (08/05 1308)  Intake/Output from previous day: 08/04 0701 - 08/05 0700 In: 120 [P.O.:120] Out: 450 [Urine:450]  Labs: No results found for this basename: WBC, RBC, HCT, PLT,  in the last 72 hours No results found for this basename: NA, K, CL, CO2, BUN, CREATININE, GLUCOSE, CALCIUM,  in the last 72 hours No results found for this basename: LABPT, INR,  in the last 72 hours  Physical Exam: Neurologically intact ABD soft Incision: dressing C/D/I and no drainage Compartment soft  Assessment/Plan: Patient stable  xrays SATISFACTORY Continue mobilization with physical therapy Continue care  Up with therapy Patietn doing well.  Unclear why he was not d/c'ed on Saturday Doing well.  Positive BM with manual disimpaction. Ok for d/c to home or snf   Venita Lick, MD [facility] [phone]

## 2013-03-22 NOTE — Progress Notes (Signed)
Occupational Therapy Treatment Patient Details Name: [name] [name] MRN: [id number] DOB: 07-03-1934 Today's Date: 03/22/2013 Time: 4098-1191 OT Time Calculation (min): 30 min  OT Assessment / Plan / Recommendation  History of present illness 79 year old male admitted with back pain 6 week(s) ago following a specific injury (lifting supplies from [employer] with pain the next day ).Evaluation revealed T12 burst fx. Pt underwent T9 - L3 posterior spinal fusion     OT comments  Session focused on UB/LB dressing and use of AE. Pt required assistance from OT for tasks and several cues to maintain back precautions. Pt will need 24/7 assistance/supervision if he discharges home.   Follow Up Recommendations  SNF;Supervision/Assistance - 24 hour    Barriers to Discharge       Equipment Recommendations  3 in 1 bedside comode    Recommendations for Other Services    Frequency Min 2X/week   Progress towards OT Goals Progress towards OT goals: Progressing toward goals  Plan Discharge plan remains appropriate    Precautions / Restrictions Precautions Precautions: Back Precaution Comments: Patient able to state all precautions Required Braces or Orthoses: Spinal Brace Spinal Brace: Lumbar corset Restrictions Weight Bearing Restrictions: No   Pertinent Vitals/Pain No apparent distress.     ADL  Upper Body Dressing: Performed;Minimal assistance Where Assessed - Upper Body Dressing: Supported sitting Lower Body Dressing: Performed;Moderate assistance Where Assessed - Lower Body Dressing: Supported sit to stand Toilet Transfer: Counsellor Method: Sit to Barista: Other (comment) (from recliner chair) Equipment Used: Rolling walker;Reacher;Long-handled shoe horn;Sock aid Transfers/Ambulation Related to ADLs: Minguard ADL Comments: Session focused on UB/LB dressing. Pt practiced with AE. Also, practiced attaching brace in front, which pt did well  with.  Several cues to maintain precautions during session.  Educated to have chair/bed behind pt when standing to pull up pants/underwear with walker in front.    OT Diagnosis:    OT Problem List:   OT Treatment Interventions:     OT Goals(current goals can now be found in the care plan section) Acute Rehab OT Goals Patient Stated Goal: get home OT Goal Formulation: With patient Time For Goal Achievement: 03/24/13 ADL Goals Pt Will Perform Grooming: with supervision;standing Pt Will Perform Upper Body Dressing: with modified independence;sitting Pt Will Perform Lower Body Dressing: with modified independence;sit to/from stand Pt Will Transfer to Toilet: with modified independence;grab bars Pt Will Perform Toileting - Clothing Manipulation and hygiene: with modified independence;sit to/from stand Pt Will Perform Tub/Shower Transfer: with supervision;grab bars Additional ADL Goal #1: Pt will recall and demonstrate safe of use of AE during bathing and dressing to follow back precautions  Visit Information  Last OT Received On: 03/22/13 Assistance Needed: +1 History of Present Illness: 79 year old male admitted with back pain 6 week(s) ago following a specific injury (lifting supplies from [employer] with pain the next day ).Evaluation revealed T12 burst fx. Pt underwent T9 - L3 posterior spinal fusion      Subjective Data      Prior Functioning       Cognition  Cognition Arousal/Alertness: Awake/alert Behavior During Therapy: WFL for tasks assessed/performed Overall Cognitive Status: Within Functional Limits for tasks assessed    Mobility  Bed Mobility Bed Mobility: Not assessed Transfers Transfers: Sit to Stand;Stand to Sit Sit to Stand: 4: Min guard;With upper extremity assist;From chair/3-in-1 Stand to Sit: 4: Min guard;With upper extremity assist;To chair/3-in-1 Details for Transfer Assistance: Minguard for safety.     Exercises  Balance     End of Session OT -  End of Session Equipment Utilized During Treatment: Rolling walker;Back brace Activity Tolerance: Patient tolerated treatment well Patient left: in chair;with call bell/phone within reach  [employer] OTR/L [phone] 03/22/2013, 5:14 PM

## 2013-03-22 NOTE — Progress Notes (Signed)
Physical Therapy Treatment Patient Details Name: [name] [name] MRN: [id number] DOB: 1934/01/14 Today's Date: 03/22/2013 Time: 4098-1191 PT Time Calculation (min): 13 min  PT Assessment / Plan / Recommendation  History of Present Illness 79 year old male admitted with back pain 6 week(s) ago following a specific injury (lifting supplies from [employer] with pain the next day ).Evaluation revealed T12 burst fx. Pt underwent T9 - L3 posterior spinal fusion     PT Comments   Patient making gains with ambulation, however, still struggling with bed mobility and standing. Family is trying to arrange assistance at home as patient not independent and is primary caregiver for his wife. Patient will need 24/7 assistance/supervision if he DCs home  Follow Up Recommendations  Home health PT;Supervision for mobility/OOB     Does the patient have the potential to tolerate intense rehabilitation     Barriers to Discharge        Equipment Recommendations  Rolling walker with 5" wheels    Recommendations for Other Services    Frequency Min 5X/week   Progress towards PT Goals Progress towards PT goals: Progressing toward goals  Plan Current plan remains appropriate    Precautions / Restrictions Precautions Precautions: Back Precaution Comments: Patient able to state all precautions Required Braces or Orthoses: Spinal Brace Spinal Brace: Lumbar corset   Pertinent Vitals/Pain no apparent distress    Mobility  Bed Mobility Rolling Right: 4: Min assist;With rail Right Sidelying to Sit: 4: Min assist;With rails Details for Bed Mobility Assistance: Patient continues to rely abd want to use rail. Needing less assistance today and cues for log roll Transfers Sit to Stand: 4: Min guard;With upper extremity assist Stand to Sit: 4: Min guard;With upper extremity assist;To chair/3-in-1 Details for Transfer Assistance: cues for hand placement and to keep back straight. Still slighly unsteady with  stand Ambulation/Gait Ambulation/Gait Assistance: 5: Supervision Ambulation Distance (Feet): 250 Feet Ambulation/Gait Assistance Details: Safer use of RW this session. Recalled not to pick up with turning Gait Pattern: Step-through pattern;Decreased stride length    Exercises     PT Diagnosis:    PT Problem List:   PT Treatment Interventions:     PT Goals (current goals can now be found in the care plan section)    Visit Information  Last PT Received On: 03/22/13 Assistance Needed: +1 History of Present Illness: 79 year old male admitted with back pain 6 week(s) ago following a specific injury (lifting supplies from [employer] with pain the next day ).Evaluation revealed T12 burst fx. Pt underwent T9 - L3 posterior spinal fusion      Subjective Data      Cognition  Cognition Arousal/Alertness: Awake/alert Behavior During Therapy: WFL for tasks assessed/performed Overall Cognitive Status: Within Functional Limits for tasks assessed    Balance     End of Session PT - End of Session Equipment Utilized During Treatment: Gait belt;Back brace Activity Tolerance: Patient tolerated treatment well Patient left: in chair;with call bell/phone within reach Nurse Communication: Mobility status   GP     Fredrich Birks 03/22/2013, 12:52 PM 03/22/2013 Fredrich Birks PTA [phone] pager [phone] office

## 2013-03-22 NOTE — Progress Notes (Signed)
   CARE MANAGEMENT NOTE 03/22/2013  Patient:  [name] [name],[name] [name]   Account Number:  [id number]  Date Initiated:  03/18/2013  Documentation initiated by:  [facility]  Subjective/Objective Assessment:   admitted postop T9-L3 spinal fusion     Action/Plan:   PT/OT evals- recommended HHPT, HHOT   Anticipated DC Date:  03/19/2013   Anticipated DC Plan:  HOME W HOME HEALTH SERVICES      DC Planning Services  CM consult      Choice offered to / List presented to:  C-1 Patient   DME arranged  3-N-1  Levan Hurst      DME agency  [facility]     HH arranged  HH-2 PT  HH-3 OT  HH-1 RN      [facility] agency  [facility]   Status of service:  Completed, signed off Medicare Important Message given?   (If response is "NO", the following Medicare IM given date fields will be blank) Date Medicare IM given:   Date Additional Medicare IM given:    Discharge Disposition:  HOME W HOME HEALTH SERVICES  Per UR Regulation:    If discussed at Long Length of Stay Meetings, dates discussed:    Comments:  03/22/2013 1600 NCM notified [facility] of scheduled dc home today with HH. DME rep notified for equipment. Isidoro Donning RN CCM Case Mgmt phone [phone]  03/21/2013 1730 Received call back from dtr. States her parents just moved into [facility] [place] on 8[place]1. She is scheduled to leave on 8/8-8/21 out of town. She has discussed with her brother in [place] to assist with parents at dc. Her [place] is at [place] and pt is her primary caregiver. Dtr will discuss with [facility] Coordinator what services are available for pt post dc. SW working on possible SNF placement at [facility] for rehab.  Isidoro Donning RN CCM Case Mgmt phone [phone]   03/21/2013 1555 Spoke to pt and he is requesting [name]. Will follow up with dtr, [name] [name] # [phone]. Left message on voice mail. Wanted to confirm dc plan to home. Lives at home with wife. He is caregiver for  his wife. Isidoro Donning RN CCM Case Mgmt phone [phone]  03/18/13 Spoke with patient about HHC. He states that he recently moved to [facility] with his wife who has dementia. I offered to contact his daughter to discuss HHC with her. Patent requested that I leave the list of HHC agencies and he will discuss it with his daughter. CM to follow up to set up home health. [name] Cree RN, BSN, CCM

## 2013-03-23 ENCOUNTER — Encounter: Payer: Self-pay | Admitting: *Deleted

## 2013-03-29 ENCOUNTER — Inpatient Hospital Stay (HOSPITAL_COMMUNITY)
Admission: AD | Admit: 2013-03-29 | Discharge: 2013-04-07 | DRG: 853 | Disposition: A | Discharge status: Skilled Nursing Facility | Payer: Medicare Other | Source: Ambulatory Visit | Attending: Orthopedic Surgery | Admitting: Orthopedic Surgery

## 2013-03-29 ENCOUNTER — Inpatient Hospital Stay (HOSPITAL_COMMUNITY): Payer: Medicare Other

## 2013-03-29 DIAGNOSIS — M4620 Osteomyelitis of vertebra, site unspecified: Secondary | ICD-10-CM | POA: Diagnosis present

## 2013-03-29 DIAGNOSIS — J9620 Acute and chronic respiratory failure, unspecified whether with hypoxia or hypercapnia: Secondary | ICD-10-CM

## 2013-03-29 DIAGNOSIS — I129 Hypertensive chronic kidney disease with stage 1 through stage 4 chronic kidney disease, or unspecified chronic kidney disease: Secondary | ICD-10-CM | POA: Diagnosis present

## 2013-03-29 DIAGNOSIS — A499 Bacterial infection, unspecified: Secondary | ICD-10-CM

## 2013-03-29 DIAGNOSIS — Z9889 Other specified postprocedural states: Secondary | ICD-10-CM

## 2013-03-29 DIAGNOSIS — Y831 Surgical operation with implant of artificial internal device as the cause of abnormal reaction of the patient, or of later complication, without mention of misadventure at the time of the procedure: Secondary | ICD-10-CM | POA: Diagnosis present

## 2013-03-29 DIAGNOSIS — M129 Arthropathy, unspecified: Secondary | ICD-10-CM | POA: Diagnosis present

## 2013-03-29 DIAGNOSIS — T84498A Other mechanical complication of other internal orthopedic devices, implants and grafts, initial encounter: Secondary | ICD-10-CM | POA: Diagnosis present

## 2013-03-29 DIAGNOSIS — A498 Other bacterial infections of unspecified site: Secondary | ICD-10-CM

## 2013-03-29 DIAGNOSIS — R509 Fever, unspecified: Secondary | ICD-10-CM

## 2013-03-29 DIAGNOSIS — Z951 Presence of aortocoronary bypass graft: Secondary | ICD-10-CM

## 2013-03-29 DIAGNOSIS — T8450XA Infection and inflammatory reaction due to unspecified internal joint prosthesis, initial encounter: Secondary | ICD-10-CM | POA: Diagnosis present

## 2013-03-29 DIAGNOSIS — D649 Anemia, unspecified: Secondary | ICD-10-CM | POA: Diagnosis present

## 2013-03-29 DIAGNOSIS — T847XXA Infection and inflammatory reaction due to other internal orthopedic prosthetic devices, implants and grafts, initial encounter: Secondary | ICD-10-CM

## 2013-03-29 DIAGNOSIS — E785 Hyperlipidemia, unspecified: Secondary | ICD-10-CM | POA: Diagnosis present

## 2013-03-29 DIAGNOSIS — D62 Acute posthemorrhagic anemia: Secondary | ICD-10-CM | POA: Diagnosis not present

## 2013-03-29 DIAGNOSIS — E1169 Type 2 diabetes mellitus with other specified complication: Secondary | ICD-10-CM | POA: Diagnosis present

## 2013-03-29 DIAGNOSIS — M9080 Osteopathy in diseases classified elsewhere, unspecified site: Secondary | ICD-10-CM | POA: Diagnosis present

## 2013-03-29 DIAGNOSIS — I2510 Atherosclerotic heart disease of native coronary artery without angina pectoris: Secondary | ICD-10-CM | POA: Diagnosis present

## 2013-03-29 DIAGNOSIS — E876 Hypokalemia: Secondary | ICD-10-CM | POA: Diagnosis not present

## 2013-03-29 DIAGNOSIS — A419 Sepsis, unspecified organism: Principal | ICD-10-CM | POA: Diagnosis present

## 2013-03-29 DIAGNOSIS — I252 Old myocardial infarction: Secondary | ICD-10-CM

## 2013-03-29 DIAGNOSIS — N170 Acute kidney failure with tubular necrosis: Secondary | ICD-10-CM | POA: Diagnosis present

## 2013-03-29 DIAGNOSIS — J9600 Acute respiratory failure, unspecified whether with hypoxia or hypercapnia: Secondary | ICD-10-CM | POA: Diagnosis not present

## 2013-03-29 DIAGNOSIS — Z79899 Other long term (current) drug therapy: Secondary | ICD-10-CM

## 2013-03-29 DIAGNOSIS — N189 Chronic kidney disease, unspecified: Secondary | ICD-10-CM | POA: Diagnosis present

## 2013-03-29 DIAGNOSIS — E43 Unspecified severe protein-calorie malnutrition: Secondary | ICD-10-CM | POA: Diagnosis present

## 2013-03-29 DIAGNOSIS — M869 Osteomyelitis, unspecified: Secondary | ICD-10-CM | POA: Diagnosis present

## 2013-03-29 DIAGNOSIS — N400 Enlarged prostate without lower urinary tract symptoms: Secondary | ICD-10-CM | POA: Diagnosis present

## 2013-03-29 DIAGNOSIS — I10 Essential (primary) hypertension: Secondary | ICD-10-CM | POA: Diagnosis present

## 2013-03-29 DIAGNOSIS — D696 Thrombocytopenia, unspecified: Secondary | ICD-10-CM | POA: Diagnosis not present

## 2013-03-29 DIAGNOSIS — E119 Type 2 diabetes mellitus without complications: Secondary | ICD-10-CM | POA: Diagnosis present

## 2013-03-29 LAB — CBC
HCT: 30.7 % — ABNORMAL LOW (ref 39.0–52.0)
MCH: 31.2 pg (ref 26.0–34.0)
MCHC: 34.2 g/dL (ref 30.0–36.0)
MCV: 91.1 fL (ref 78.0–100.0)
Platelets: 75 10*3/uL — ABNORMAL LOW (ref 150–400)
RDW: 15.6 % — ABNORMAL HIGH (ref 11.5–15.5)

## 2013-03-29 LAB — BASIC METABOLIC PANEL
BUN: 23 mg/dL (ref 6–23)
Calcium: 8.8 mg/dL (ref 8.4–10.5)
Chloride: 98 mEq/L (ref 96–112)
Creatinine, Ser: 0.79 mg/dL (ref 0.50–1.35)
GFR calc Af Amer: >90 mL/min (ref >90)

## 2013-03-29 LAB — PROTIME-INR: Prothrombin Time: 17.8 seconds — ABNORMAL HIGH (ref 11.6–15.2)

## 2013-03-29 MED ORDER — SODIUM CHLORIDE 0.9 % IJ SOLN
3.0000 mL | Freq: Two times a day (BID) | INTRAMUSCULAR | Status: DC
  Administered 2013-03-29 – 2013-03-30 (×3): 3 mL via INTRAVENOUS

## 2013-03-29 MED ORDER — TAMSULOSIN HCL 0.4 MG PO CAPS
0.4000 mg | ORAL_CAPSULE | Freq: Every day | ORAL | Status: DC
  Administered 2013-03-29: 0.4 mg via ORAL
  Filled 2013-03-29 (×2): qty 1

## 2013-03-29 MED ORDER — LOSARTAN POTASSIUM 50 MG PO TABS
100.0000 mg | ORAL_TABLET | Freq: Every day | ORAL | Status: DC
Start: 2013-03-30 — End: 2013-03-30
  Filled 2013-03-29: qty 2

## 2013-03-29 MED ORDER — SODIUM CHLORIDE 0.9 % IJ SOLN: 3.0000 mL | INTRAMUSCULAR | Status: DC | PRN

## 2013-03-29 MED ORDER — BRIMONIDINE TARTRATE 0.2 % OP SOLN
1.0000 [drp] | Freq: Two times a day (BID) | OPHTHALMIC | Status: DC
  Administered 2013-03-29 – 2013-03-30 (×2): 1 [drp] via OPHTHALMIC
  Filled 2013-03-29: qty 5

## 2013-03-29 MED ORDER — INSULIN ASPART 100 UNIT/ML ~~LOC~~ SOLN
0.0000 [IU] | SUBCUTANEOUS | Status: DC
  Administered 2013-03-29: 8 [IU] via SUBCUTANEOUS

## 2013-03-29 MED ORDER — LACTATED RINGERS IV SOLN
INTRAVENOUS | Status: DC
  Administered 2013-03-30: 11:00:00 via INTRAVENOUS
  Administered 2013-03-30: 85 mL/h via INTRAVENOUS

## 2013-03-29 MED ORDER — MENTHOL 3 MG MT LOZG: 1.0000 | LOZENGE | OROMUCOSAL | Status: DC | PRN

## 2013-03-29 MED ORDER — CEFAZOLIN SODIUM 1-5 GM-% IV SOLN
1.0000 g | Freq: Once | INTRAVENOUS | Status: AC
  Administered 2013-03-29: 1 g via INTRAVENOUS
  Filled 2013-03-29: qty 50

## 2013-03-29 MED ORDER — METHOCARBAMOL 500 MG PO TABS: 500.0000 mg | ORAL_TABLET | Freq: Four times a day (QID) | ORAL | Status: DC | PRN

## 2013-03-29 MED ORDER — ONDANSETRON HCL 4 MG/2ML IJ SOLN: 4.0000 mg | INTRAMUSCULAR | Status: DC | PRN

## 2013-03-29 MED ORDER — DEXTROSE 5 % IV SOLN
500.0000 mg | Freq: Four times a day (QID) | INTRAVENOUS | Status: DC | PRN
  Filled 2013-03-29: qty 5

## 2013-03-29 MED ORDER — MORPHINE SULFATE 2 MG/ML IJ SOLN
1.0000 mg | INTRAMUSCULAR | Status: DC | PRN
  Administered 2013-03-29: 4 mg via INTRAVENOUS
  Administered 2013-03-30: 2 mg via INTRAVENOUS
  Filled 2013-03-29: qty 1
  Filled 2013-03-29: qty 2

## 2013-03-29 MED ORDER — SODIUM CHLORIDE 0.9 % IV SOLN
250.0000 mL | INTRAVENOUS | Status: DC
  Administered 2013-03-29 – 2013-03-30 (×2): 250 mL via INTRAVENOUS

## 2013-03-29 MED ORDER — SIMVASTATIN 10 MG PO TABS
10.0000 mg | ORAL_TABLET | Freq: Every evening | ORAL | Status: DC
  Administered 2013-03-29: 10 mg via ORAL
  Filled 2013-03-29 (×3): qty 1

## 2013-03-29 MED ORDER — ACETAMINOPHEN 10 MG/ML IV SOLN
1000.0000 mg | Freq: Once | INTRAVENOUS | Status: AC
  Administered 2013-03-29: 1000 mg via INTRAVENOUS
  Filled 2013-03-29: qty 100

## 2013-03-29 MED ORDER — GLIPIZIDE 2.5 MG HALF TABLET
2.5000 mg | ORAL_TABLET | Freq: Every day | ORAL | Status: DC
  Administered 2013-03-29: 2.5 mg via ORAL
  Filled 2013-03-29 (×4): qty 1

## 2013-03-29 MED ORDER — METFORMIN HCL 500 MG PO TABS
1000.0000 mg | ORAL_TABLET | Freq: Every day | ORAL | Status: DC
  Filled 2013-03-29 (×2): qty 2

## 2013-03-29 MED ORDER — ZOLPIDEM TARTRATE 5 MG PO TABS: 5.0000 mg | ORAL_TABLET | Freq: Every evening | ORAL | Status: DC | PRN

## 2013-03-29 MED ORDER — PHENOL 1.4 % MT LIQD: 1.0000 | OROMUCOSAL | Status: DC | PRN

## 2013-03-29 NOTE — Progress Notes (Signed)
CT scan reviewed:      Left L 2 and 3 screw will need to be removed due to migration    Right T9 and 11 screw will require removal due to migration     Will test stability of remaining screws at time of surgery.  Would like to maintain some hardware if it is stable as this will increase likelyhood of solid fusion.

## 2013-03-29 NOTE — H&P (Signed)
History of Present Illness The patient is a 79 year old male who presents today for follow up of their back. The patient is being followed for their central T12 burst fx. . They are now 13 day(s) out from surgery (T9-L3 PSF on 03/16/13). Symptoms reported today include: pain, while the patient does not report symptoms of: numbness. The patient states that they are doing well. The following medication has been used for pain control: Hydrocodone (5/325 2 q 4 hrs and has had a bad weekend and could not get out of bed). The patient reports their current pain level to be 5-6 / 10. The patient presents today following to discuss surgery sch. for [id number]. The patient indicates that they have questions or concerns today regarding Medications (his daughter is concerned that he is not taking his daily meds.).    Subjective Transcription  We are now two weeks out from a T9 to L3 spine fusion. The patient in the last week has noticed increased drainage and increased pain. He was brought in today for further evaluation.    Allergies No Known Drug Allergies. 11/16/2012   Social History Alcohol use. current drinker; drinks beer and hard liquor; only occasionally per week Children. 2 Current work status. retired Financial planner (Currently). no Drug/Alcohol Rehab (Previously). no Exercise. Exercises rarely Illicit drug use. no Living situation. live with spouse Marital status. married Number of flights of stairs before winded. 1 Pain Contract. no Tobacco / smoke exposure. no Tobacco use. never smoker   Medication History Vitamin B 12 ( Tablet, Oral) Active. Losartan Potassium (100MG  Tablet, Oral) Active. Fish Oil Burp-Less (1200MG  Capsule, Oral) Active. Vitamin D3 High Potency (1000UNIT Capsule, Oral) Active. Simvastatin (20MG  Tablet, Oral) Active. Calcium-Vitamin D (500MG  Capsule, Oral) Active. MetFORMIN HCl (1000MG  Tablet, Oral) Active. Glucosamine Chondroitin  Complx ( Oral) Active. GlipiZIDE XL (5MG  Tablet ER 24HR, Oral) Active. Tylenol Extra Strength (500MG  Tablet, Oral) Active. Co Q-10 (100MG  Capsule, Oral) Active. Tamsulosin HCl (0.4MG  Capsule ER, Oral) Active. Norco (5-325MG  Tablet, Oral) Active.   Objective Transcription  On clinical exam he has a thoracic prominence that is very tender and he has significant swelling at the lower end of his incision. There is more of a serosanguineous drainage, no purulent odor, no purulent drainage. No SOB/CP.  Abd soft/NT. Compartments soft/NT.  NVI no motor/sensory deficits.  No fever/chills/    RADIOGRAPHS:  X-rays show that he has catastrophic failure of his hardware, the T9, T10 and T11 screws have backed out. The L3 and L2 screws have backed out.    Assessment & Plan Burst fracture of T12 vertebra (806.25) Current Plans l X-RAY OF THORACIC SPINE, TWO VIEWS (72070) (STANDING AP/LAT SMT) l X-RAY OF LUMBAR SPINE, TWO OR THREE VIEWS (72100) (STANDING AP/LAT LSP SMT)   Plans Transcription  Unfortunately at this point in time I think the patient's overwhelming osteopenia and poor bone quality have led to failure of the hardware. Initially the hardware was well positioned and had good strength. After he left the hospital he denies any falls or injuries that could account for this. Other than kind of twisting and moving himself around he did not do anything strenuous. At this point though I think the best course of action is returning to the operating room to wash out the wound and then remove all of the screws. At this point I am apprehensive about placing hardware given the fact that within two weeks these screws that I have in there have failed. I would leave  the bone graft that is present and simply put him back into an [place] TLSO brace for three to six months with an external bone stimulator. My hope is that the TLSO will provide stability to allow the incorporation of the bone  graft. I have discussed this with the patient and his daughter and they are in agreement with the plan. I will admit him to the hospital, we will get a CT scan to better evaluate the hardware and then we will plan on surgery Wednesday.

## 2013-03-29 NOTE — Progress Notes (Signed)
Orthopedic Tech Progress Note Patient Details:  [name] [name] Oct 24, 1933 [id number]  Patient ID: [name] [name], male   DOB: October 26, 1933, 79 y.o.   MRN: [id number]   [name] [name] 03/29/2013, 7:15 PM Brace completed by [employer] vendor

## 2013-03-29 NOTE — Progress Notes (Signed)
Orthopedic Tech Progress Note Patient Details:  [name] [name] 1934/03/07 [id number]      [name] 03/29/2013, 3:05 PM [employer] called to place brace order

## 2013-03-29 NOTE — Progress Notes (Signed)
[facility] Home health serving the needs of this patient, and will continue to follow. Any question please call Ayesha Rumpf RN, BSN [phone]

## 2013-03-30 ENCOUNTER — Encounter (HOSPITAL_COMMUNITY)
Admission: AD | Disposition: A | Discharge status: Skilled Nursing Facility | Payer: Self-pay | Source: Ambulatory Visit | Attending: Orthopedic Surgery

## 2013-03-30 ENCOUNTER — Encounter (HOSPITAL_COMMUNITY): Payer: Self-pay | Admitting: Anesthesiology

## 2013-03-30 ENCOUNTER — Inpatient Hospital Stay (HOSPITAL_COMMUNITY): Payer: Medicare Other | Admitting: Anesthesiology

## 2013-03-30 DIAGNOSIS — I10 Essential (primary) hypertension: Secondary | ICD-10-CM

## 2013-03-30 DIAGNOSIS — E119 Type 2 diabetes mellitus without complications: Secondary | ICD-10-CM

## 2013-03-30 DIAGNOSIS — M4620 Osteomyelitis of vertebra, site unspecified: Secondary | ICD-10-CM | POA: Diagnosis present

## 2013-03-30 DIAGNOSIS — M869 Osteomyelitis, unspecified: Secondary | ICD-10-CM

## 2013-03-30 DIAGNOSIS — I2510 Atherosclerotic heart disease of native coronary artery without angina pectoris: Secondary | ICD-10-CM

## 2013-03-30 HISTORY — PX: HARDWARE REMOVAL: SHX979

## 2013-03-30 LAB — GLUCOSE, CAPILLARY
Glucose-Capillary: 100 mg/dL — ABNORMAL HIGH (ref 70–99)
Glucose-Capillary: 88 mg/dL (ref 70–99)
Glucose-Capillary: 74 mg/dL (ref 70–99)

## 2013-03-30 LAB — CBC
HCT: 22.9 % — ABNORMAL LOW (ref 39.0–52.0)
Hemoglobin: 8.2 g/dL — ABNORMAL LOW (ref 13.0–17.0)
MCHC: 35.8 g/dL (ref 30.0–36.0)

## 2013-03-30 LAB — PREPARE RBC (CROSSMATCH)

## 2013-03-30 LAB — GRAM STAIN

## 2013-03-30 LAB — HEMOGLOBIN A1C: Mean Plasma Glucose: 120 mg/dL — ABNORMAL HIGH (ref <117)

## 2013-03-30 SURGERY — REMOVAL, HARDWARE
Anesthesia: General | Site: Back | Wound class: Contaminated

## 2013-03-30 MED ORDER — ONDANSETRON HCL 4 MG/2ML IJ SOLN
INTRAMUSCULAR | Status: DC | PRN
  Administered 2013-03-30: 4 mg via INTRAVENOUS

## 2013-03-30 MED ORDER — DEXTROSE 5 % IV SOLN
2.0000 g | Freq: Three times a day (TID) | INTRAVENOUS | Status: DC
  Administered 2013-03-30 – 2013-03-31 (×3): 2 g via INTRAVENOUS
  Filled 2013-03-30 (×5): qty 2

## 2013-03-30 MED ORDER — CEFAZOLIN SODIUM-DEXTROSE 2-3 GM-% IV SOLR
INTRAVENOUS | Status: DC | PRN
  Administered 2013-03-30: 2 g via INTRAVENOUS

## 2013-03-30 MED ORDER — HYDROMORPHONE HCL PF 1 MG/ML IJ SOLN
0.2500 mg | INTRAMUSCULAR | Status: DC | PRN
  Administered 2013-03-30: 0.25 mg via INTRAVENOUS
  Administered 2013-03-30 (×2): 0.5 mg via INTRAVENOUS

## 2013-03-30 MED ORDER — VANCOMYCIN HCL 1000 MG IV SOLR
INTRAVENOUS | Status: DC | PRN
  Administered 2013-03-30: 1000 mg

## 2013-03-30 MED ORDER — MORPHINE SULFATE 2 MG/ML IJ SOLN
1.0000 mg | INTRAMUSCULAR | Status: DC | PRN
  Administered 2013-03-31 (×3): 2 mg via INTRAVENOUS
  Administered 2013-03-31: 0.5 mg via INTRAVENOUS
  Filled 2013-03-30 (×2): qty 1
  Filled 2013-03-30: qty 2
  Filled 2013-03-30: qty 1

## 2013-03-30 MED ORDER — BUPIVACAINE-EPINEPHRINE PF 0.25-1:200000 % IJ SOLN
INTRAMUSCULAR | Status: AC
  Filled 2013-03-30: qty 30

## 2013-03-30 MED ORDER — OXYCODONE HCL 5 MG PO TABS
10.0000 mg | ORAL_TABLET | ORAL | Status: DC | PRN
Start: 2013-03-30 — End: 2013-04-06
  Administered 2013-03-30: 5 mg via ORAL
  Administered 2013-03-31 – 2013-04-06 (×6): 10 mg via ORAL
  Filled 2013-03-30 (×8): qty 2

## 2013-03-30 MED ORDER — THROMBIN 20000 UNITS EX SOLR
CUTANEOUS | Status: AC
  Filled 2013-03-30: qty 20000

## 2013-03-30 MED ORDER — PROPOFOL 10 MG/ML IV BOLUS
INTRAVENOUS | Status: DC | PRN
  Administered 2013-03-30: 100 mg via INTRAVENOUS

## 2013-03-30 MED ORDER — ALBUMIN HUMAN 5 % IV SOLN
INTRAVENOUS | Status: DC | PRN
  Administered 2013-03-30: 16:00:00 via INTRAVENOUS

## 2013-03-30 MED ORDER — ONDANSETRON HCL 4 MG/2ML IJ SOLN: 4.0000 mg | INTRAMUSCULAR | Status: DC | PRN

## 2013-03-30 MED ORDER — LIDOCAINE HCL (CARDIAC) 20 MG/ML IV SOLN
INTRAVENOUS | Status: DC | PRN
  Administered 2013-03-30: 40 mg via INTRAVENOUS

## 2013-03-30 MED ORDER — MENTHOL 3 MG MT LOZG
1.0000 | LOZENGE | OROMUCOSAL | Status: DC | PRN
  Filled 2013-03-30: qty 9

## 2013-03-30 MED ORDER — VANCOMYCIN HCL IN DEXTROSE 1-5 GM/200ML-% IV SOLN
1000.0000 mg | Freq: Two times a day (BID) | INTRAVENOUS | Status: DC
  Administered 2013-03-30: 1000 mg via INTRAVENOUS
  Filled 2013-03-30: qty 200

## 2013-03-30 MED ORDER — HYDROMORPHONE HCL PF 1 MG/ML IJ SOLN
INTRAMUSCULAR | Status: AC
  Administered 2013-03-30: 0.25 mg via INTRAVENOUS
  Filled 2013-03-30: qty 1

## 2013-03-30 MED ORDER — SODIUM CHLORIDE 0.9 % IJ SOLN: 3.0000 mL | INTRAMUSCULAR | Status: DC | PRN

## 2013-03-30 MED ORDER — SODIUM CHLORIDE 0.9 % IV SOLN: 250.0000 mL | INTRAVENOUS | Status: DC

## 2013-03-30 MED ORDER — BUPIVACAINE-EPINEPHRINE 0.25% -1:200000 IJ SOLN
INTRAMUSCULAR | Status: DC | PRN
  Administered 2013-03-30: 30 mL

## 2013-03-30 MED ORDER — ZOLPIDEM TARTRATE 5 MG PO TABS: 5.0000 mg | ORAL_TABLET | Freq: Every evening | ORAL | Status: DC | PRN

## 2013-03-30 MED ORDER — PHENOL 1.4 % MT LIQD: 1.0000 | OROMUCOSAL | Status: DC | PRN

## 2013-03-30 MED ORDER — HYDROMORPHONE HCL PF 1 MG/ML IJ SOLN
INTRAMUSCULAR | Status: AC
  Filled 2013-03-30: qty 1

## 2013-03-30 MED ORDER — 0.9 % SODIUM CHLORIDE (POUR BTL) OPTIME
TOPICAL | Status: DC | PRN
  Administered 2013-03-30: 1000 mL

## 2013-03-30 MED ORDER — PHENYLEPHRINE HCL 10 MG/ML IJ SOLN
INTRAMUSCULAR | Status: DC | PRN
  Administered 2013-03-30 (×4): 80 ug via INTRAVENOUS

## 2013-03-30 MED ORDER — FENTANYL CITRATE 0.05 MG/ML IJ SOLN
INTRAMUSCULAR | Status: DC | PRN
  Administered 2013-03-30 (×5): 50 ug via INTRAVENOUS

## 2013-03-30 MED ORDER — LACTATED RINGERS IV SOLN
INTRAVENOUS | Status: DC
  Administered 2013-03-30: 20:00:00 via INTRAVENOUS

## 2013-03-30 MED ORDER — OXYCODONE HCL 5 MG PO TABS
ORAL_TABLET | ORAL | Status: AC
  Filled 2013-03-30: qty 1

## 2013-03-30 MED ORDER — INSULIN ASPART 100 UNIT/ML ~~LOC~~ SOLN
0.0000 [IU] | SUBCUTANEOUS | Status: DC
  Administered 2013-03-31 – 2013-04-01 (×4): 2 [IU] via SUBCUTANEOUS

## 2013-03-30 MED ORDER — THROMBIN 20000 UNITS EX SOLR
CUTANEOUS | Status: DC | PRN
  Administered 2013-03-30: 17:00:00 via TOPICAL

## 2013-03-30 MED ORDER — SODIUM CHLORIDE 0.9 % IJ SOLN
3.0000 mL | Freq: Two times a day (BID) | INTRAMUSCULAR | Status: DC
  Administered 2013-04-01: 3 mL via INTRAVENOUS

## 2013-03-30 MED ORDER — DOCUSATE SODIUM 100 MG PO CAPS
100.0000 mg | ORAL_CAPSULE | Freq: Two times a day (BID) | ORAL | Status: DC
  Administered 2013-03-31 – 2013-04-07 (×13): 100 mg via ORAL
  Filled 2013-03-30 (×16): qty 1

## 2013-03-30 MED ORDER — METHOCARBAMOL 500 MG PO TABS
500.0000 mg | ORAL_TABLET | Freq: Four times a day (QID) | ORAL | Status: DC | PRN
  Administered 2013-03-30: 500 mg via ORAL
  Filled 2013-03-30: qty 1

## 2013-03-30 MED ORDER — DEXTROSE 50 % IV SOLN
50.0000 mL | Freq: Once | INTRAVENOUS | Status: AC
  Administered 2013-03-30: 25 mL via INTRAVENOUS

## 2013-03-30 MED ORDER — SUCCINYLCHOLINE CHLORIDE 20 MG/ML IJ SOLN
INTRAMUSCULAR | Status: DC | PRN
  Administered 2013-03-30: 100 mg via INTRAVENOUS

## 2013-03-30 MED ORDER — VANCOMYCIN HCL 1000 MG IV SOLR
INTRAVENOUS | Status: AC
  Filled 2013-03-30: qty 1000

## 2013-03-30 MED ORDER — CEFAZOLIN SODIUM-DEXTROSE 2-3 GM-% IV SOLR
INTRAVENOUS | Status: AC
  Filled 2013-03-30: qty 50

## 2013-03-30 MED ORDER — METHOCARBAMOL 100 MG/ML IJ SOLN
500.0000 mg | Freq: Four times a day (QID) | INTRAVENOUS | Status: DC | PRN
  Filled 2013-03-30: qty 5

## 2013-03-30 MED ORDER — ACETAMINOPHEN 10 MG/ML IV SOLN
INTRAVENOUS | Status: AC
  Filled 2013-03-30: qty 100

## 2013-03-30 MED ORDER — VANCOMYCIN HCL IN DEXTROSE 1-5 GM/200ML-% IV SOLN
1000.0000 mg | Freq: Two times a day (BID) | INTRAVENOUS | Status: DC
Start: 2013-03-31 — End: 2013-04-01
  Administered 2013-03-31 (×2): 1000 mg via INTRAVENOUS
  Filled 2013-03-30 (×5): qty 200

## 2013-03-30 MED ORDER — METHOCARBAMOL 500 MG PO TABS
ORAL_TABLET | ORAL | Status: AC
  Filled 2013-03-30: qty 1

## 2013-03-30 MED ORDER — LACTATED RINGERS IV SOLN
INTRAVENOUS | Status: DC
  Administered 2013-03-30: 14:00:00 via INTRAVENOUS

## 2013-03-30 MED ORDER — CEFAZOLIN SODIUM 1-5 GM-% IV SOLN
1.0000 g | Freq: Three times a day (TID) | INTRAVENOUS | Status: DC
  Filled 2013-03-30 (×2): qty 50

## 2013-03-30 MED ORDER — ACETAMINOPHEN 10 MG/ML IV SOLN
1000.0000 mg | Freq: Four times a day (QID) | INTRAVENOUS | Status: AC
  Administered 2013-03-30 – 2013-03-31 (×4): 1000 mg via INTRAVENOUS
  Filled 2013-03-30 (×2): qty 100

## 2013-03-30 MED ORDER — FLEET ENEMA 7-19 GM/118ML RE ENEM: 1.0000 | ENEMA | Freq: Once | RECTAL | Status: AC | PRN

## 2013-03-30 SURGICAL SUPPLY — 76 items
CLOTH BEACON ORANGE TIMEOUT ST (SAFETY) ×2 IMPLANT
CO AXIAL FAN SPRAY TIP SOFT SH (MISCELLANEOUS) ×2 IMPLANT
CORDS BIPOLAR (ELECTRODE) ×2 IMPLANT
COVER MAYO STAND STRL (DRAPES) ×2 IMPLANT
COVER SURGICAL LIGHT HANDLE (MISCELLANEOUS) ×2 IMPLANT
CUFF TOURNIQUET SINGLE 34IN LL (TOURNIQUET CUFF) IMPLANT
CUFF TOURNIQUET SINGLE 44IN (TOURNIQUET CUFF) IMPLANT
DERMABOND ADVANCED (GAUZE/BANDAGES/DRESSINGS)
DERMABOND ADVANCED .7 DNX12 (GAUZE/BANDAGES/DRESSINGS) IMPLANT
DRAPE INCISE IOBAN 66X45 STRL (DRAPES) ×2 IMPLANT
DRAPE POUCH INSTRU U-SHP 10X18 (DRAPES) ×2 IMPLANT
DRAPE PROXIMA HALF (DRAPES) ×2 IMPLANT
DRAPE SURG 17X23 STRL (DRAPES) ×2 IMPLANT
DRAPE TABLE COVER HEAVY DUTY (DRAPES) ×2 IMPLANT
DRAPE U-SHAPE 47X51 STRL (DRAPES) ×2 IMPLANT
DRSG ADAPTIC 3X8 NADH LF (GAUZE/BANDAGES/DRESSINGS) ×2 IMPLANT
DRSG OPSITE 6X11 MED (GAUZE/BANDAGES/DRESSINGS) IMPLANT
DRSG VAC ATS SM SENSATRAC (GAUZE/BANDAGES/DRESSINGS) ×2 IMPLANT
DURAPREP 26ML APPLICATOR (WOUND CARE) ×2 IMPLANT
ELECT BLADE 4.0 EZ CLEAN MEGAD (MISCELLANEOUS)
ELECT BLADE 6.5 EXT (BLADE) ×2 IMPLANT
ELECT CAUTERY BLADE 6.4 (BLADE) ×2 IMPLANT
ELECT REM PT RETURN 9FT ADLT (ELECTROSURGICAL) ×2
ELECTRODE BLDE 4.0 EZ CLN MEGD (MISCELLANEOUS) IMPLANT
ELECTRODE REM PT RTRN 9FT ADLT (ELECTROSURGICAL) ×1 IMPLANT
EVACUATOR 1/8 PVC DRAIN (DRAIN) IMPLANT
GLOVE BIOGEL PI IND STRL 6.5 (GLOVE) ×1 IMPLANT
GLOVE BIOGEL PI IND STRL 8.5 (GLOVE) ×1 IMPLANT
GLOVE BIOGEL PI INDICATOR 6.5 (GLOVE) ×1
GLOVE BIOGEL PI INDICATOR 8.5 (GLOVE) ×1
GLOVE ECLIPSE 6.0 STRL STRAW (GLOVE) ×2 IMPLANT
GLOVE ECLIPSE 8.5 STRL (GLOVE) ×2 IMPLANT
GLOVE SS BIOGEL STRL SZ 8 (GLOVE) ×1 IMPLANT
GLOVE SUPERSENSE BIOGEL SZ 8 (GLOVE) ×1
GOWN PREVENTION PLUS XXLARGE (GOWN DISPOSABLE) ×2 IMPLANT
GOWN STRL NON-REIN LRG LVL3 (GOWN DISPOSABLE) ×4 IMPLANT
HANDPIECE INTERPULSE COAX TIP (DISPOSABLE) ×1
IV NS IRRIG 3000ML ARTHROMATIC (IV SOLUTION) ×6 IMPLANT
KIT BASIN OR (CUSTOM PROCEDURE TRAY) ×2 IMPLANT
KIT POSITION SURG JACKSON T1 (MISCELLANEOUS) ×2 IMPLANT
KIT ROOM TURNOVER OR (KITS) ×2 IMPLANT
KIT STIMULAN RAPID CURE  10CC (Orthopedic Implant) ×1 IMPLANT
KIT STIMULAN RAPID CURE 10CC (Orthopedic Implant) ×1 IMPLANT
NEEDLE 22X1 1/2 (OR ONLY) (NEEDLE) ×2 IMPLANT
NS IRRIG 1000ML POUR BTL (IV SOLUTION) ×2 IMPLANT
PACK LAMINECTOMY ORTHO (CUSTOM PROCEDURE TRAY) ×2 IMPLANT
PACK UNIVERSAL I (CUSTOM PROCEDURE TRAY) ×2 IMPLANT
PAD ARMBOARD 7.5X6 YLW CONV (MISCELLANEOUS) ×4 IMPLANT
PAD NEG PRESSURE SENSATRAC (MISCELLANEOUS) ×2 IMPLANT
PATTIES SURGICAL .5 X.5 (GAUZE/BANDAGES/DRESSINGS) IMPLANT
PATTIES SURGICAL .5 X1 (DISPOSABLE) ×2 IMPLANT
SET HNDPC FAN SPRY TIP SCT (DISPOSABLE) ×1 IMPLANT
SPONGE GAUZE 4X4 12PLY (GAUZE/BANDAGES/DRESSINGS) ×2 IMPLANT
SPONGE LAP 18X18 X RAY DECT (DISPOSABLE) ×4 IMPLANT
SPONGE LAP 4X18 X RAY DECT (DISPOSABLE) ×4 IMPLANT
SPONGE SURGIFOAM ABS GEL 100 (HEMOSTASIS) IMPLANT
STRIP CLOSURE SKIN 1/2X4 (GAUZE/BANDAGES/DRESSINGS) IMPLANT
SUT ETHILON 3 0 FSL (SUTURE) IMPLANT
SUT MNCRL AB 3-0 PS2 18 (SUTURE) ×2 IMPLANT
SUT PDS AB 1 CTX 36 (SUTURE) ×10 IMPLANT
SUT VIC AB 0 CTB1 27 (SUTURE) ×4 IMPLANT
SUT VIC AB 1 CTX 36 (SUTURE) ×2
SUT VIC AB 1 CTX36XBRD ANBCTR (SUTURE) ×2 IMPLANT
SUT VIC AB 2-0 CT1 18 (SUTURE) ×2 IMPLANT
SUT VIC AB 3-0 X1 27 (SUTURE) IMPLANT
SWAB COLLECTION DEVICE MRSA (MISCELLANEOUS) ×2 IMPLANT
SWAB CULTURE LIQUID MINI MALE (MISCELLANEOUS) ×2 IMPLANT
SYR BULB IRRIGATION 50ML (SYRINGE) ×2 IMPLANT
SYR CONTROL 10ML LL (SYRINGE) ×2 IMPLANT
TOWEL NATURAL 6PK STERILE (DISPOSABLE) ×2 IMPLANT
TOWEL OR 17X24 6PK STRL BLUE (TOWEL DISPOSABLE) ×4 IMPLANT
TOWEL OR 17X26 10 PK STRL BLUE (TOWEL DISPOSABLE) ×2 IMPLANT
TRAY FOLEY CATH 16FRSI W/METER (SET/KITS/TRAYS/PACK) IMPLANT
TUBE CONNECTING 12X1/4 (SUCTIONS) ×2 IMPLANT
WATER STERILE IRR 1000ML POUR (IV SOLUTION) IMPLANT
YANKAUER SUCT BULB TIP NO VENT (SUCTIONS) ×4 IMPLANT

## 2013-03-30 NOTE — Consult Note (Signed)
PULMONARY  / CRITICAL CARE MEDICINE  Name: [name] [name] MRN: [id number] DOB: June 25, 1934    ADMISSION DATE:  03/29/2013 CONSULTATION DATE:  03/30/2013  REFERRING MD :  Shon Baton, neurosurgery PRIMARY SERVICE: NSGY  CHIEF COMPLAINT:  T9-L3 fusion device failure/infection; Post op medical management  BRIEF PATIENT DESCRIPTION: 79 y/o male with CKD, DM2 was admitted on 8/13 for removal of infected spinal hardware placed on 7/30 for a T12 burst fracture.  PCCM consulted for medical management.  SIGNIFICANT EVENTS / STUDIES:  8/13 CT thoracic/lumbar spine> complex report, see results in [facility] 8/13 T9-L3 hardware removed, irrigation and debridement of back wound  LINES / TUBES:   CULTURES: 8/13 wound culture >>  ANTIBIOTICS: 8/13 Vanc>  8/13 Cefazolin > x1 8/13 ceftaz >   HISTORY OF PRESENT ILLNESS:  79 y/o male with CKD, DM2 was admitted on 8/13 for removal of infected spinal hardware placed on 7/30 for a T12 burst fracture.  PCCM consulted for medical management.   He presented to NSGY clinic on 8/12 with increased drainage and redness around his spinal wound. He is confused post operatively and unable to provide history so history is obtained from chart review.   However, he notes mild back pain and denies other symptoms.  Apparently the surgery went well without complication.  PAST MEDICAL HISTORY :  Past Medical History  Diagnosis Date  . Hypertension   . Diabetes mellitus without complication   . Chronic kidney disease     bph  . Arthritis   . Coronary artery disease   . Myocardial infarction    Past Surgical History  Procedure Laterality Date  . Appendectomy    . Coronary artery bypass graft  11/09/2000    LIMA to LAD, SVG to RPDA, seq SVG to OM2 and D1 by [name] [facility]  . Lumbar fusion  03/15/2013    Dr Shon Baton  . Posterior lumbar fusion 4 level N/A 03/16/2013    Procedure: T9 - L3 POSTERIOR POSTERIOR SPINAL FUSION ;  Surgeon: Venita Lick, MD;  Location:  [facility] OR;  Service: Orthopedics;  Laterality: N/A;   Prior to Admission medications   Medication Sig Start Date End Date Taking? Authorizing Provider  brimonidine (ALPHAGAN) 0.2 % ophthalmic solution Place 1 drop into both eyes 2 (two) times daily.   Yes Historical Provider, MD  calcium-vitamin D (OSCAL WITH D) 250-125 MG-UNIT per tablet Take 2 tablets by mouth 2 (two) times daily.   Yes Historical Provider, MD  cholecalciferol (VITAMIN D) 1000 UNITS tablet Take 1,000 Units by mouth daily.   Yes Historical Provider, MD  Coenzyme Q10 (COQ-10) 100 MG CAPS Take 200 mg by mouth daily.   Yes Historical Provider, MD  docusate sodium (COLACE) 100 MG capsule Take 1 capsule (100 mg total) by mouth 3 (three) times daily as needed for constipation. 03/19/13  Yes Venita Lick, MD  fish oil-omega-3 fatty acids 1000 MG capsule Take 1 g by mouth daily.    Yes Historical Provider, MD  glipiZIDE (GLUCOTROL) 5 MG tablet Take 2.5 mg by mouth daily.    Yes Historical Provider, MD  glucosamine-chondroitin 500-400 MG tablet Take 1 tablet by mouth daily.    Yes Historical Provider, MD  HYDROcodone-acetaminophen (NORCO/VICODIN) 5-325 MG per tablet Take 1 tablet by mouth every 6 (six) hours as needed for pain. 03/19/13  Yes Venita Lick, MD  losartan (COZAAR) 100 MG tablet Take 100 mg by mouth daily.   Yes Historical Provider, MD  metFORMIN (GLUCOPHAGE) 1000 MG tablet  Take 1,000 mg by mouth daily.   Yes Historical Provider, MD  methocarbamol (ROBAXIN) 500 MG tablet Take 1 tablet (500 mg total) by mouth 3 (three) times daily as needed. 03/19/13  Yes Venita Lick, MD  Multiple Vitamins-Minerals (MULTIVITAMIN WITH MINERALS) tablet Take 1 tablet by mouth daily.   Yes Historical Provider, MD  ondansetron (ZOFRAN) 4 MG tablet Take 1 tablet (4 mg total) by mouth every 8 (eight) hours as needed for nausea. 03/19/13  Yes Venita Lick, MD  polyethylene glycol powder (GLYCOLAX) powder Take 17 g by mouth daily. 03/19/13  Yes Venita Lick, MD   Pyridoxine HCl (VITAMIN B-6) 500 MG tablet Take 500 mg by mouth daily.   Yes Historical Provider, MD  simvastatin (ZOCOR) 20 MG tablet Take 10 mg by mouth every evening.   Yes Historical Provider, MD  tamsulosin (FLOMAX) 0.4 MG CAPS Take 0.4 mg by mouth daily.   Yes Historical Provider, MD   No Known Allergies  FAMILY HISTORY:  Family History  Problem Relation Age of Onset  . Diabetes Mother   . Stroke Brother     x2   SOCIAL HISTORY:  reports that he has quit smoking. He has never used smokeless tobacco. He reports that he does not drink alcohol or use illicit drugs.  REVIEW OF SYSTEMS:  Cannot obtain due to confusion  SUBJECTIVE:   VITAL SIGNS: Temp:  [97.4 F (36.3 C)-100.5 F (38.1 C)] 97.8 F (36.6 C) (08/13 1946) Pulse Rate:  [80-105] 91 (08/13 1946) Resp:  [9-18] 10 (08/13 1946) BP: (98-135)/(23-76) 110/51 mmHg (08/13 1946) SpO2:  [95 %-98 %] 97 % (08/13 1946) Weight:  [86.7 kg (191 lb 2.2 oz)] 86.7 kg (191 lb 2.2 oz) (08/13 1946) HEMODYNAMICS:   VENTILATOR SETTINGS:   INTAKE / OUTPUT: Intake/Output     08/13 0701 - 08/14 0700   I.V. (mL/kg) 900 (10.4)   Blood 383   IV Piggyback 250   Total Intake(mL/kg) 1533 (17.7)   Urine (mL/kg/hr) 550 (0.5)   Blood 100 (0.1)   Total Output 650   Net +883         PHYSICAL EXAMINATION:  Gen: confused, comfortable in bed, no acute distress HEENT: NCAT, PERRL, EOMi, OP clear, MM dry PULM: CTA B CV: RRR, periodic PAC's, no mgr, no JVD AB: BS+, soft, nontender, no hsm Ext: warm, trace edema, no clubbing, no cyanosis Derm: no rash or skin breakdown Neuro: Confused, but follows commands, clear speech, maew   LABS:  CBC Recent Labs     03/29/13  1418  03/30/13  1800  WBC  9.3  7.5  HGB  10.5*  8.2*  HCT  30.7*  22.9*  PLT  75*  66*   Coag's Recent Labs     03/29/13  1418  INR  1.51*   BMET Recent Labs     03/29/13  1418  NA  135  K  4.0  CL  98  CO2  21  BUN  23  CREATININE  0.79  GLUCOSE   260*   Electrolytes Recent Labs     03/29/13  1418  CALCIUM  8.8   Sepsis Markers No results found for this basename: LACTICACIDVEN, PROCALCITON, O2SATVEN,  in the last 72 hours ABG No results found for this basename: PHART, PCO2ART, PO2ART,  in the last 72 hours Liver Enzymes No results found for this basename: AST, ALT, ALKPHOS, BILITOT, ALBUMIN,  in the last 72 hours Cardiac Enzymes No results found for this basename:  TROPONINI, PROBNP,  in the last 72 hours Glucose Recent Labs     03/29/13  1631  03/29/13  2136  03/30/13  0640  03/30/13  1122  03/30/13  1350  03/30/13  1443  GLUCAP  262*  122*  100*  88  74  114*    Imaging   CXR:   ASSESSMENT / PLAN:  INFECTIOUS A:  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; 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; P:   -Vanc -f/u wound cultures -check cefazolin to ceftaz for broader Gm negative coverage (diabetic), consider ID consult 8/14   PULMONARY A: NO acute issues P:   -monitor O2 saturation  CARDIOVASCULAR A: Hypertension Hyperlipidemia P:  -hold BP meds this evening -restart Losartan, statin 8/14  RENAL A:  BPH P:   -restart flomax 8/14  GASTROINTESTINAL A:  No acute issues P:   -advance diet per surgery  HEMATOLOGIC A:  Post op anemia> no evidence of ongoing bleeding; received PRBC x2 and PLT in OR P:  -monitor for bleeding -f/u CBC in AM -transfusion threshold is Hgb < 7   ENDOCRINE A:  DM2 P:   -agree with SSI for now -restart home oral agents when eating  NEUROLOGIC A:  Spinal hardware infection P:   -as above  TODAY'S SUMMARY: 79 y/o male with DM2 with infection of recently placed spinal hardware; PCCM consulted for med management; will restart BP meds 8/14, consider ID consult 8/14  [facility], DOUGLAS,MD Pulmonary and Critical Care Medicine [facility] Pager: [phone]  03/30/2013, 8:50 PM

## 2013-03-30 NOTE — Progress Notes (Signed)
ANTIBIOTIC CONSULT NOTE - INITIAL  Pharmacy Consult for fortaz Indication: infected spinal hardware  No Known Allergies  Patient Measurements: Height: 5\' 8"  (172.7 cm) Weight: 191 lb 2.2 oz (86.7 kg) IBW/kg (Calculated) : 68.4  Vital Signs: Temp: 97.8 F (36.6 C) (08/13 1946) Temp src: Oral (08/13 1946) BP: 110/51 mmHg (08/13 1946) Pulse Rate: 91 (08/13 1946) Intake/Output from previous day: 08/12 0701 - 08/13 0700 In: 1023 [I.V.:1023] Out: 250 [Urine:250] Intake/Output from this shift: Total I/O In: -  Out: 250 [Urine:250]  Labs:  Recent Labs  03/29/13 1418 03/30/13 1800  WBC 9.3 7.5  HGB 10.5* 8.2*  PLT 75* 66*  CREATININE 0.79  --    Estimated Creatinine Clearance: 84.1 ml/min (by C-G formula based on Cr of 0.79). No results found for this basename: VANCOTROUGH, Leodis Binet, VANCORANDOM, GENTTROUGH, GENTPEAK, GENTRANDOM, TOBRATROUGH, TOBRAPEAK, TOBRARND, AMIKACINPEAK, AMIKACINTROU, AMIKACIN,  in the last 72 hours   Microbiology: Recent Results (from the past 720 hour(s))  SURGICAL PCR SCREEN     Status: Abnormal   Collection Time    03/08/13  8:44 AM      Result Value Range Status   MRSA, PCR POSITIVE (*) NEGATIVE Final   Staphylococcus aureus POSITIVE (*) NEGATIVE Final   Comment:            The Xpert SA Assay (FDA     approved for NASAL specimens     in patients over 76 years of age),     is one component of     a comprehensive surveillance     program.  Test performance has     been validated by [employer] for patients greater     than or equal to 58 year old.     It is not intended     to diagnose infection nor to     guide or monitor treatment.  GRAM STAIN     Status: None   Collection Time    03/30/13  3:50 PM      Result Value Range Status   Specimen Description WOUND BACK   Final   Special Requests NONE   Final   Gram Stain     Final   Value: MODERATE WBC PRESENT,BOTH PMN AND MONONUCLEAR     NO ORGANISMS SEEN   Report Status  03/30/2013 FINAL   Final   Assessment: 79 year old male with infected spinal hardware now s/p removal. Vancomycin/fortaz ordered post-op for broad coverage. ID consult likely for tomorrow 8/14. Scr within normal limits.  Goal of Therapy:  Vancomycin trough level 15-20 mcg/ml Eradication of infection  Plan:  Vancomycin 1g IV q12 hours Fortaz 2g q8 hours Follow cultures and renal function  Sheppard Coil PharmD., BCPS Clinical Pharmacist Pager [phone] 03/30/2013 9:44 PM

## 2013-03-30 NOTE — Anesthesia Postprocedure Evaluation (Signed)
  Anesthesia Post-op Note  Patient: [name] [name]  Procedure(s) Performed: Procedure(s): HARDWARE REMOVAL/IRRIGATION & DEBRIDEMENT OF WOUND ON BACK (N/A)  Patient Location: PACU  Anesthesia Type:General  Level of Consciousness: awake  Airway and Oxygen Therapy: Patient Spontanous Breathing and Patient connected to nasal cannula oxygen  Post-op Pain: mild  Post-op Assessment: Post-op Vital signs reviewed, Patient's Cardiovascular Status Stable, Respiratory Function Stable, Patent Airway and No signs of Nausea or vomiting  Post-op Vital Signs: Reviewed and stable  Complications: No apparent anesthesia complications

## 2013-03-30 NOTE — Progress Notes (Signed)
No change in clinical exam H+P reviewed  

## 2013-03-30 NOTE — Anesthesia Preprocedure Evaluation (Addendum)
Anesthesia Evaluation  Patient identified by MRN, date of birth, ID band Patient awake    Reviewed: Allergy & Precautions, H&P , NPO status , Patient's Chart, lab work & pertinent test results  Airway Mallampati: II TM Distance: >3 FB Neck ROM: Full    Dental no notable dental hx. (+) Edentulous Upper and Partial Lower   Pulmonary neg pulmonary ROS,  breath sounds clear to auscultation  Pulmonary exam normal       Cardiovascular hypertension, On Medications + CAD, + Past MI and + CABG negative cardio ROS  Rhythm:Regular Rate:Normal     Neuro/Psych negative neurological ROS  negative psych ROS   GI/Hepatic negative GI ROS, Neg liver ROS,   Endo/Other  negative endocrine ROSdiabetes, Type 2, Oral Hypoglycemic Agents  Renal/GU Renal diseasenegative Renal ROS  negative genitourinary   Musculoskeletal   Abdominal   Peds  Hematology negative hematology ROS (+)   Anesthesia Other Findings   Reproductive/Obstetrics negative OB ROS                          Anesthesia Physical Anesthesia Plan  ASA: III  Anesthesia Plan: General   Post-op Pain Management:    Induction: Intravenous  Airway Management Planned: Oral ETT  Additional Equipment:   Intra-op Plan:   Post-operative Plan: Extubation in OR  Informed Consent: I have reviewed the patients History and Physical, chart, labs and discussed the procedure including the risks, benefits and alternatives for the proposed anesthesia with the patient or authorized representative who has indicated his/her understanding and acceptance.   Dental advisory given  Plan Discussed with: CRNA  Anesthesia Plan Comments:         Anesthesia Quick Evaluation

## 2013-03-30 NOTE — Transfer of Care (Signed)
Immediate Anesthesia Transfer of Care Note  Patient: [name] [name]  Procedure(s) Performed: Procedure(s): HARDWARE REMOVAL/IRRIGATION & DEBRIDEMENT OF WOUND ON BACK (N/A)  Patient Location: PACU  Anesthesia Type:General  Level of Consciousness: awake, alert  and oriented  Airway & Oxygen Therapy: Patient Spontanous Breathing and Patient connected to nasal cannula oxygen  Post-op Assessment: Report given to PACU RN and Post -op Vital signs reviewed and stable  Post vital signs: Reviewed and stable  Complications: No apparent anesthesia complications

## 2013-03-30 NOTE — Brief Op Note (Signed)
03/29/2013 - 03/30/2013  5:13 PM  PATIENT:  [name] [name]  79 y.o. male  PRE-OPERATIVE DIAGNOSIS:  HARDWARE FAILURE/WOUND DRAINAGE  POST-OPERATIVE DIAGNOSIS:  HARDWARE FAILURE/WOUND DRAINAGE  PROCEDURE:  Procedure(s): HARDWARE REMOVAL/IRRIGATION & DEBRIDEMENT OF WOUND ON BACK (N/A)  SURGEON:  Surgeon(s) and Role:    * Venita Lick, MD - Primary  PHYSICIAN ASSISTANT:   ASSISTANTS: none   ANESTHESIA:   general  EBL:  Total I/O In: 1533 [I.V.:900; Blood:383; IV Piggyback:250] Out: 400 [Urine:300; Blood:100]  BLOOD ADMINISTERED:none  DRAINS: none   LOCAL MEDICATIONS USED:  NONE  SPECIMEN:  Aspirate  DISPOSITION OF SPECIMEN:  microbiology - gram stain  COUNTS:  YES  TOURNIQUET:  * No tourniquets in log *  DICTATION: .Other Dictation: Dictation Number [id number]  PLAN OF CARE: Admit to inpatient   PATIENT DISPOSITION:  PACU - hemodynamically stable.

## 2013-03-30 NOTE — Anesthesia Procedure Notes (Signed)
Procedure Name: Intubation Date/Time: 03/30/2013 3:12 PM Performed by: Arlice Colt B Pre-anesthesia Checklist: Patient identified, Emergency Drugs available, Suction available, Patient being monitored and Timeout performed Patient Re-evaluated:Patient Re-evaluated prior to inductionOxygen Delivery Method: Circle system utilized Preoxygenation: Pre-oxygenation with 100% oxygen Intubation Type: IV induction and Rapid sequence Laryngoscope Size: Mac and 4 Grade View: Grade I Tube type: Oral Tube size: 8.0 mm Number of attempts: 1 Airway Equipment and Method: Stylet Placement Confirmation: ETT inserted through vocal cords under direct vision,  positive ETCO2 and breath sounds checked- equal and bilateral Secured at: 22 cm Tube secured with: Tape Dental Injury: Teeth and Oropharynx as per pre-operative assessment

## 2013-03-31 ENCOUNTER — Inpatient Hospital Stay (HOSPITAL_COMMUNITY): Payer: Medicare Other

## 2013-03-31 ENCOUNTER — Encounter (HOSPITAL_COMMUNITY): Payer: Self-pay | Admitting: Orthopedic Surgery

## 2013-03-31 DIAGNOSIS — T847XXA Infection and inflammatory reaction due to other internal orthopedic prosthetic devices, implants and grafts, initial encounter: Secondary | ICD-10-CM

## 2013-03-31 DIAGNOSIS — E43 Unspecified severe protein-calorie malnutrition: Secondary | ICD-10-CM | POA: Diagnosis present

## 2013-03-31 DIAGNOSIS — J9600 Acute respiratory failure, unspecified whether with hypoxia or hypercapnia: Secondary | ICD-10-CM

## 2013-03-31 LAB — CBC
HCT: 21.9 % — ABNORMAL LOW (ref 39.0–52.0)
HCT: 21.1 % — ABNORMAL LOW (ref 39.0–52.0)
HCT: 21.5 % — ABNORMAL LOW (ref 39.0–52.0)
Hemoglobin: 7.9 g/dL — ABNORMAL LOW (ref 13.0–17.0)
MCH: 31.7 pg (ref 26.0–34.0)
MCH: 30.9 pg (ref 26.0–34.0)
MCH: 31.4 pg (ref 26.0–34.0)
MCHC: 34.6 g/dL (ref 30.0–36.0)
MCHC: 35.3 g/dL (ref 30.0–36.0)
MCV: 88 fL (ref 78.0–100.0)
MCV: 89.4 fL (ref 78.0–100.0)
MCV: 88.8 fL (ref 78.0–100.0)
Platelets: 30 10*3/uL — ABNORMAL LOW (ref 150–400)
Platelets: 41 10*3/uL — ABNORMAL LOW (ref 150–400)
RBC: 2.49 MIL/uL — ABNORMAL LOW (ref 4.22–5.81)
RDW: 15.7 % — ABNORMAL HIGH (ref 11.5–15.5)
RDW: 15.4 % (ref 11.5–15.5)
WBC: 4 10*3/uL (ref 4.0–10.5)

## 2013-03-31 LAB — BLOOD GAS, ARTERIAL
Acid-base deficit: 8.4 mmol/L — ABNORMAL HIGH (ref 0.0–2.0)
Drawn by: 313941
FIO2: 1 %
Patient temperature: 98.6
pCO2 arterial: 30.9 mmHg — ABNORMAL LOW (ref 35.0–45.0)
pH, Arterial: 7.341 — ABNORMAL LOW (ref 7.350–7.450)

## 2013-03-31 LAB — BASIC METABOLIC PANEL
BUN: 23 mg/dL (ref 6–23)
BUN: 29 mg/dL — ABNORMAL HIGH (ref 6–23)
CO2: 22 mEq/L (ref 19–32)
CO2: 17 mEq/L — ABNORMAL LOW (ref 19–32)
CO2: 20 mEq/L (ref 19–32)
Calcium: 8.7 mg/dL (ref 8.4–10.5)
Calcium: 8.5 mg/dL (ref 8.4–10.5)
Calcium: 8.7 mg/dL (ref 8.4–10.5)
Chloride: 100 mEq/L (ref 96–112)
Creatinine, Ser: 0.87 mg/dL (ref 0.50–1.35)
Creatinine, Ser: 1.12 mg/dL (ref 0.50–1.35)
Glucose, Bld: 99 mg/dL (ref 70–99)
Glucose, Bld: 71 mg/dL (ref 70–99)
Sodium: 137 mEq/L (ref 135–145)

## 2013-03-31 LAB — GLUCOSE, CAPILLARY
Glucose-Capillary: 104 mg/dL — ABNORMAL HIGH (ref 70–99)
Glucose-Capillary: 113 mg/dL — ABNORMAL HIGH (ref 70–99)
Glucose-Capillary: 107 mg/dL — ABNORMAL HIGH (ref 70–99)
Glucose-Capillary: 65 mg/dL — ABNORMAL LOW (ref 70–99)

## 2013-03-31 LAB — URINALYSIS, ROUTINE W REFLEX MICROSCOPIC
Ketones, ur: 15 mg/dL — AB
Nitrite: POSITIVE — AB
Urobilinogen, UA: 2 mg/dL — ABNORMAL HIGH (ref 0.0–1.0)

## 2013-03-31 LAB — URINE MICROSCOPIC-ADD ON

## 2013-03-31 LAB — HEMOGLOBIN A1C: Hgb A1c MFr Bld: 5.4 % (ref <5.7)

## 2013-03-31 LAB — PROTIME-INR: INR: 1.57 — ABNORMAL HIGH (ref 0.00–1.49)

## 2013-03-31 LAB — PREPARE PLATELET PHERESIS

## 2013-03-31 LAB — PROCALCITONIN: Procalcitonin: 5.38 ng/mL

## 2013-03-31 MED ORDER — IOHEXOL 350 MG/ML SOLN
100.0000 mL | Freq: Once | INTRAVENOUS | Status: AC | PRN
  Administered 2013-03-31: 100 mL via INTRAVENOUS

## 2013-03-31 MED ORDER — DEXTROSE 5 % IV SOLN
2.0000 g | Freq: Three times a day (TID) | INTRAVENOUS | Status: DC
  Administered 2013-03-31 – 2013-04-02 (×5): 2 g via INTRAVENOUS
  Filled 2013-03-31 (×11): qty 2

## 2013-03-31 MED ORDER — MIDAZOLAM HCL 2 MG/2ML IJ SOLN: 1.0000 mg | INTRAMUSCULAR | Status: DC | PRN

## 2013-03-31 MED ORDER — FENTANYL CITRATE 0.05 MG/ML IJ SOLN: 50.0000 ug | INTRAMUSCULAR | Status: DC | PRN

## 2013-03-31 NOTE — Progress Notes (Signed)
OT Cancellation Note  Patient Details Name: Cyle Kenyon MRN: [id number] DOB: Dec 11, 1933   Cancelled Treatment:    Reason Eval/Treat Not Completed: Other (comment) (pt's brace not yet in room). Will check back later. RN aware.  03/31/2013 Cipriano Mile OTR/L Pager [phone] Office [phone]

## 2013-03-31 NOTE — Progress Notes (Addendum)
Paged Dr. Lauretta Chester returned call.  Re: HR 130-140s, BP 150/95, afebrile, tachypneic, shivering, change of consciousness, 3.5L [place] now on venturi mask. 1710 PA stated to call CCM.   1715 CCM paged. Will send someone up.  Rapid response called.   Rectal temp 98.68F. Pt. Placed on non-rebreather.   1850: Pt. Improving, continues on non-rebreather, labored breathing, planned CTA--need appropriate IV. IV team paged, attempting IV access.  Pt. To move to ICU after IV access and CT angio.   1900: Daughter Micah Flesher with update.   Will continue to monitor. Rapid Response team at bedside, night RN given report/update/plan. ICU RN given report, awaiting arrival of patient.   1940: family at bedside. 1950: CT occupied at the moment. Will move patient with rapid response and RN will move patient to 2100 ICU, family to follow.

## 2013-03-31 NOTE — Progress Notes (Signed)
INITIAL NUTRITION ASSESSMENT  DOCUMENTATION CODES Per approved criteria  -Severe malnutrition in the context of acute illness or injury   INTERVENTION:  Diet advancement as medical status allows per MD.  When diet advanced, recommend Ensure Complete po BID, each supplement provides 350 kcal and 13 grams of protein.  NUTRITION DIAGNOSIS: Increased nutrient needs related to wounds with VAC in place as evidenced by estimated nutrition needs.   Goal: Intake to meet >90% of estimated nutrition needs to promote wound healing.  Monitor:  Diet advancement, PO intake, labs, weight trend.  Reason for Assessment: MD Consult regarding assessment of nutrition requirements/status   79 y.o. male  Admitting Dx: Hardware failure and wound infection S/P T9-L3 instrumented fusion 3 weeks ago  ASSESSMENT: Recent admission for T12 burst fx. 13 days out from initial surgery. S/P I&D and removal of hardware 8/13. Patient with a large incision on back from spinal surgery. Also with a stage 2 pressure ulcer to left inner buttock; WOC RN has seen patient. Patient says he has been eating well since initial back surgery. He is glad that he has lost some weight.   Nutrition Focused Physical Exam:  Subcutaneous Fat:  Orbital Region: WNL Upper Arm Region: WNL Thoracic and Lumbar Region: NA  Muscle:  Temple Region: WNL Clavicle Bone Region: mild-moderate depletion Clavicle and Acromion Bone Region: WNL Scapular Bone Region: NA Dorsal Hand: WNL Patellar Region: WNL Anterior Thigh Region: WNL Posterior Calf Region: UTA  Edema: UTA  Pt meets criteria for severe MALNUTRITION in the context of acute illness as evidenced by 5.5% weight loss in the past 3 weeks and moderate depletion of muscle mass.  Height: Ht Readings from Last 1 Encounters:  03/30/13 5\' 8"  (1.727 m)    Weight: Wt Readings from Last 1 Encounters:  03/30/13 191 lb 2.2 oz (86.7 kg)    Ideal Body Weight: 70 kg  % Ideal Body  Weight: 124%  Wt Readings from Last 10 Encounters:  03/30/13 191 lb 2.2 oz (86.7 kg)  03/30/13 191 lb 2.2 oz (86.7 kg)  03/15/13 202 lb (91.627 kg)  03/11/13 202 lb (91.627 kg)  03/08/13 203 lb (92.08 kg)    Usual Body Weight: 202 lb  % Usual Body Weight: 94.5%  BMI:  Body mass index is 29.07 kg/(m^2).  Estimated Nutritional Needs: Kcal: 2000-2200 Protein: 115-130 gm Fluid: 2.1-2.3 L  Skin: stage 2 pressure ulcer on buttocks; VAC to back incision  Diet Order:  NPO  EDUCATION NEEDS: -Education not appropriate at this time   Intake/Output Summary (Last 24 hours) at 03/31/13 0858 Last data filed at 03/31/13 0759  Gross per 24 hour  Intake   1833 ml  Output   1400 ml  Net    433 ml    Last BM: 8/12   Labs:   Recent Labs Lab 03/29/13 1418 03/31/13 0500  NA 135 137  K 4.0 3.4*  CL 98 103  CO2 21 22  BUN 23 22  CREATININE 0.79 0.85  CALCIUM 8.8 8.7  GLUCOSE 260* 99    CBG (last 3)   Recent Labs  03/31/13 0028 03/31/13 0420 03/31/13 0754  GLUCAP 129* 95 115*    Scheduled Meds: . acetaminophen  1,000 mg Intravenous Q6H  . cefTAZidime (FORTAZ)  IV  2 g Intravenous Q8H  . docusate sodium  100 mg Oral BID  . insulin aspart  0-15 Units Subcutaneous Q4H  . sodium chloride  3 mL Intravenous Q12H  . vancomycin  1,000 mg  Intravenous Q12H    Continuous Infusions: . sodium chloride    . lactated ringers 85 mL/hr at 03/31/13 0600    Past Medical History  Diagnosis Date  . Hypertension   . Diabetes mellitus without complication   . Chronic kidney disease     bph  . Arthritis   . Coronary artery disease   . Myocardial infarction     Past Surgical History  Procedure Laterality Date  . Appendectomy    . Coronary artery bypass graft  11/09/2000    LIMA to LAD, SVG to RPDA, seq SVG to OM2 and D1 by [name] [facility]  . Lumbar fusion  03/15/2013    Dr Shon Baton  . Posterior lumbar fusion 4 level N/A 03/16/2013    Procedure: T9 - L3  POSTERIOR POSTERIOR SPINAL FUSION ;  Surgeon: Venita Lick, MD;  Location: [facility] OR;  Service: Orthopedics;  Laterality: N/A;    Joaquin Courts, RD, LDN, CNSC Pager [phone] After Hours Pager [phone]

## 2013-03-31 NOTE — Significant Event (Signed)
Rapid Response Event Note  Overview: Time Called: 1730 Arrival Time: 1733 Event Type: Respiratory  Initial Focused Assessment: Patient in respiratory distress.  RR 30s, HR 140s with frequent PVCs, BP 150/95 O2 sats 86% on [place] per RN increased to venti then 100%NRB  O2 sats 100%  BP 113/64  HR 130s  RR 30s with continued increased work of breathing.  Dr Delton Coombes at bedside.  ABG ordered, PCXR ordered. And done Right forearm swollen, IV infiltrated earlier, removed this am.  Interventions: 0.5mg  Morphine given IV Patient improving, decreased WOB, HR 115 BP 110/47  RR 28  O2 sats 100% Labs drawn  20 ga IV placed in left arm by IV team after multiple attempts. Transferred to 2104 Plan CTA of Chest  Event Summary: Name of Physician Notified: Dr Delton Coombes at 1730    at    Outcome: Transferred (Comment) (2104)  Event End Time: 2000  Marcellina Millin

## 2013-03-31 NOTE — Consult Note (Signed)
[facility]    Date of Admission:  03/29/2013  Date of Consult:  03/31/2013  Reason for Consult: Active T9-L3 vertebral hardware Referring Physician: Dr. Shon Baton   HPI: [name] [name] is an 79 y.o. male. As history significant for diabetes mellitus chronic kidney disease who had undergone T9-L3 to shin for a T12 burst fracture that failed conservative management. Turn to see Dr. Shon Baton on the 13th with the deformity of the upper portion of his postoperative wound drainage that was copious. Films that showed migration of the hardware concerning for deep infection and patient was taken to the operating room by Dr. Shon Baton on August 13. Perform deep incision and debridement and took out multiple screws and I believe all the hardware that is in place and cultures have been sent. Patient was placed postoperatively on vancomycin and now ceftaz edema and cultures are pending.   Past Medical History  Diagnosis Date  . Hypertension   . Diabetes mellitus without complication   . Chronic kidney disease     bph  . Arthritis   . Coronary artery disease   . Myocardial infarction     Past Surgical History  Procedure Laterality Date  . Appendectomy    . Coronary artery bypass graft  11/09/2000    LIMA to LAD, SVG to RPDA, seq SVG to OM2 and D1 by [name] [facility]  . Lumbar fusion  03/15/2013    Dr Shon Baton  . Posterior lumbar fusion 4 level N/A 03/16/2013    Procedure: T9 - L3 POSTERIOR POSTERIOR SPINAL FUSION ;  Surgeon: Venita Lick, MD;  Location: [facility] OR;  Service: Orthopedics;  Laterality: N/A;  ergies:   No Known Allergies   Medications: I have reviewed patients current medications as documented in Epic Anti-infectives   Start     Dose/Rate Route Frequency Ordered Stop   03/31/13 1000  vancomycin (VANCOCIN) IVPB 1000 mg/200 mL premix     1,000 mg 200 mL/hr over 60 Minutes Intravenous Every 12 hours 03/30/13 2144     03/30/13 2300  cefTAZidime  (FORTAZ) 2 g in dextrose 5 % 50 mL IVPB     2 g 100 mL/hr over 30 Minutes Intravenous 3 times per day 03/30/13 2132     03/30/13 2200  ceFAZolin (ANCEF) IVPB 1 g/50 mL premix  Status:  Discontinued     1 g 100 mL/hr over 30 Minutes Intravenous Every 8 hours 03/30/13 1723 03/30/13 2112   03/30/13 2100  vancomycin (VANCOCIN) IVPB 1000 mg/200 mL premix  Status:  Discontinued     1,000 mg 200 mL/hr over 60 Minutes Intravenous Every 12 hours 03/30/13 1723 03/30/13 2144   03/30/13 1641  vancomycin (VANCOCIN) powder  Status:  Discontinued       As needed 03/30/13 1649 03/30/13 1703   03/29/13 1430  ceFAZolin (ANCEF) IVPB 1 g/50 mL premix     1 g 100 mL/hr over 30 Minutes Intravenous  Once 03/29/13 1417 03/29/13 1606      Social History:  reports that he has quit smoking. He has never used smokeless tobacco. He reports that he does not drink alcohol or use illicit drugs.  Family History  Problem Relation Age of Onset  . Diabetes Mother   . Stroke Brother     x2    As in HPI and primary teams notes otherwise 12 point review of systems is negative  Blood pressure 125/60, pulse 101, temperature 97.7 F (36.5 C), temperature source Oral,  resp. rate 19, height 5\' 8"  (1.727 m), weight 191 lb 2.2 oz (86.7 kg), SpO2 98.00%. General: Alert and awake, oriented x2  not in any acute distress doesn't seem to understand clearly that he has an infection HEENT: anicteric sclera, , EOMI, oropharynx clear and without exudate CVS regular rate, normal r,  no murmur rubs or gallops Chest: clear to auscultation bilaterally, no wheezing, rales or rhonchi Abdomen: soft nontender, nondistended, normal bowel sounds, Extremities: no  clubbing or edema noted bilaterally  Skin: no rashes dressing in place Neuro: nonfocal,    Results for orders placed during the hospital encounter of 03/29/13 (from the past 48 hour(s))  GLUCOSE, CAPILLARY     Status: Abnormal   Collection Time    03/29/13  4:31 PM      Result  Value Range   Glucose-Capillary 262 (*) 70 - 99 mg/dL  GLUCOSE, CAPILLARY     Status: Abnormal   Collection Time    03/29/13  9:36 PM      Result Value Range   Glucose-Capillary 122 (*) 70 - 99 mg/dL  GLUCOSE, CAPILLARY     Status: Abnormal   Collection Time    03/30/13  6:40 AM      Result Value Range   Glucose-Capillary 100 (*) 70 - 99 mg/dL  GLUCOSE, CAPILLARY     Status: None   Collection Time    03/30/13 11:22 AM      Result Value Range   Glucose-Capillary 88  70 - 99 mg/dL   Comment 1 Documented in Chart     Comment 2 Notify RN    GLUCOSE, CAPILLARY     Status: None   Collection Time    03/30/13  1:50 PM      Result Value Range   Glucose-Capillary 74  70 - 99 mg/dL  PREPARE RBC (CROSSMATCH)     Status: None   Collection Time    03/30/13  2:25 PM      Result Value Range   Order Confirmation ORDER PROCESSED BY BLOOD BANK    TYPE AND SCREEN     Status: None   Collection Time    03/30/13  2:25 PM      Result Value Range   ABO/RH(D) A POS     Antibody Screen NEG     Sample Expiration 04/02/2013     Unit Number [id number]     Blood Component Type RED CELLS,LR     Unit division 00     Status of Unit ALLOCATED     Transfusion Status OK TO TRANSFUSE     Crossmatch Result Compatible     Unit Number [id number]     Blood Component Type RED CELLS,LR     Unit division 00     Status of Unit ALLOCATED     Transfusion Status OK TO TRANSFUSE     Crossmatch Result Compatible    PREPARE PLATELET PHERESIS     Status: None   Collection Time    03/30/13  2:33 PM      Result Value Range   Unit Number [id number]     Blood Component Type PLTPHER LR1     Unit division 00     Status of Unit ISSUED,FINAL     Transfusion Status OK TO TRANSFUSE    GLUCOSE, CAPILLARY     Status: Abnormal   Collection Time    03/30/13  2:43 PM      Result Value Range   Glucose-Capillary 114 (*)  70 - 99 mg/dL  ANAEROBIC CULTURE     Status: None   Collection Time    03/30/13  3:50 PM       Result Value Range   Specimen Description WOUND BACK     Special Requests NONE     Gram Stain       Value: MODERATE WBC PRESENT,BOTH PMN AND MONONUCLEAR     NO SQUAMOUS EPITHELIAL CELLS SEEN     NO ORGANISMS SEEN     Performed at [facility]     Performed at [facility]   Culture       Value: NO ANAEROBES ISOLATED; CULTURE IN PROGRESS FOR 5 DAYS     Performed at [facility]   Report Status PENDING    WOUND CULTURE     Status: None   Collection Time    03/30/13  3:50 PM      Result Value Range   Specimen Description WOUND BACK     Special Requests NONE     Gram Stain       Value: MODERATE WBC PRESENT,BOTH PMN AND MONONUCLEAR     NO SQUAMOUS EPITHELIAL CELLS SEEN     NO ORGANISMS SEEN     Performed at [facility]     Performed at [facility]   Culture       Value: Culture reincubated for better growth     Performed at [facility]   Report Status PENDING    GRAM STAIN     Status: None   Collection Time    03/30/13  3:50 PM      Result Value Range   Specimen Description WOUND BACK     Special Requests NONE     Gram Stain       Value: MODERATE WBC PRESENT,BOTH PMN AND MONONUCLEAR     NO ORGANISMS SEEN   Report Status 03/30/2013 FINAL    GLUCOSE, CAPILLARY     Status: Abnormal   Collection Time    03/30/13  5:28 PM      Result Value Range   Glucose-Capillary 104 (*) 70 - 99 mg/dL  HEMOGLOBIN X9J     Status: None   Collection Time    03/30/13  6:00 PM      Result Value Range   Hemoglobin A1C 5.4  <5.7 %   Comment: (NOTE)                                                                               According to the ADA Clinical Practice Recommendations for 2011, when     HbA1c is used as a screening test:      >=6.5%   Diagnostic of Diabetes Mellitus               (if abnormal result is confirmed)     5.7-6.4%   Increased risk of developing Diabetes Mellitus     References:Diagnosis and Classification of Diabetes  [place],Diabetes     Care,2011,34(Suppl 1):S62-S69 and Standards of Medical Care in             Diabetes - 2011,Diabetes Care,2011,34 (Suppl 1):S11-S61.   Mean Plasma Glucose  108  <117 mg/dL   Comment: Performed at [facility]  CBC     Status: Abnormal   Collection Time    03/30/13  6:00 PM      Result Value Range   WBC 7.5  4.0 - 10.5 K/uL   RBC 2.60 (*) 4.22 - 5.81 MIL/uL   Hemoglobin 8.2 (*) 13.0 - 17.0 g/dL   Comment: SPECIMEN CHECKED FOR CLOTS     REPEATED TO VERIFY     DELTA CHECK NOTED   HCT 22.9 (*) 39.0 - 52.0 %   MCV 88.1  78.0 - 100.0 fL   MCH 31.5  26.0 - 34.0 pg   MCHC 35.8  30.0 - 36.0 g/dL   RDW 95.6  21.3 - 08.6 %   Platelets 66 (*) 150 - 400 K/uL   Comment: REPEATED TO VERIFY     SPECIMEN CHECKED FOR CLOTS     CONSISTENT WITH PREVIOUS RESULT  MRSA PCR SCREENING     Status: None   Collection Time    03/30/13 10:34 PM      Result Value Range   MRSA by PCR NEGATIVE  NEGATIVE   Comment:            The GeneXpert MRSA Assay (FDA     approved for NASAL specimens     only), is one component of a     comprehensive MRSA colonization     surveillance program. It is not     intended to diagnose MRSA     infection nor to guide or     monitor treatment for     MRSA infections.  GLUCOSE, CAPILLARY     Status: Abnormal   Collection Time    03/31/13 12:28 AM      Result Value Range   Glucose-Capillary 129 (*) 70 - 99 mg/dL   Comment 1 Notify RN     Comment 2 Documented in Chart    GLUCOSE, CAPILLARY     Status: None   Collection Time    03/31/13  4:20 AM      Result Value Range   Glucose-Capillary 95  70 - 99 mg/dL   Comment 1 Notify RN     Comment 2 Documented in Chart    CBC     Status: Abnormal   Collection Time    03/31/13  5:00 AM      Result Value Range   WBC 7.5  4.0 - 10.5 K/uL   RBC 2.49 (*) 4.22 - 5.81 MIL/uL   Hemoglobin 7.9 (*) 13.0 - 17.0 g/dL   HCT 57.8 (*) 46.9 - 62.9 %   MCV 88.0  78.0 - 100.0 fL   MCH 31.7  26.0 - 34.0 pg    MCHC 36.1 (*) 30.0 - 36.0 g/dL   RDW 52.8  41.3 - 24.4 %   Platelets 63 (*) 150 - 400 K/uL   Comment: CONSISTENT WITH PREVIOUS RESULT  BASIC METABOLIC PANEL     Status: Abnormal   Collection Time    03/31/13  5:00 AM      Result Value Range   Sodium 137  135 - 145 mEq/L   Potassium 3.4 (*) 3.5 - 5.1 mEq/L   Chloride 103  96 - 112 mEq/L   CO2 22  19 - 32 mEq/L   Glucose, Bld 99  70 - 99 mg/dL   BUN 22  6 - 23 mg/dL   Creatinine, Ser 0.10  0.50 - 1.35 mg/dL  Calcium 8.7  8.4 - 10.5 mg/dL   GFR calc non Af Amer 83 (*) >90 mL/min   GFR calc Af Amer >90  >90 mL/min   Comment: (NOTE)     The eGFR has been calculated using the CKD EPI equation.     This calculation has not been validated in all clinical situations.     eGFR's persistently <90 mL/min signify possible Chronic Kidney     Disease.  PROTIME-INR     Status: Abnormal   Collection Time    03/31/13  5:00 AM      Result Value Range   Prothrombin Time 18.3 (*) 11.6 - 15.2 seconds   INR 1.57 (*) 0.00 - 1.49  GLUCOSE, CAPILLARY     Status: Abnormal   Collection Time    03/31/13  7:54 AM      Result Value Range   Glucose-Capillary 115 (*) 70 - 99 mg/dL   Comment 1 Documented in Chart     Comment 2 Notify RN    GLUCOSE, CAPILLARY     Status: Abnormal   Collection Time    03/31/13 12:00 PM      Result Value Range   Glucose-Capillary 113 (*) 70 - 99 mg/dL   Comment 1 Documented in Chart     Comment 2 Notify RN        Component Value Date/Time   SDES WOUND BACK 03/30/2013 1550   SDES WOUND BACK 03/30/2013 1550   SDES WOUND BACK 03/30/2013 1550   SPECREQUEST NONE 03/30/2013 1550   SPECREQUEST NONE 03/30/2013 1550   SPECREQUEST NONE 03/30/2013 1550   CULT  Value: NO ANAEROBES ISOLATED; CULTURE IN PROGRESS FOR 5 DAYS Performed at [facility] 03/30/2013 1550   CULT  Value: Culture reincubated for better growth Performed at [facility] 03/30/2013 1550   REPTSTATUS PENDING 03/30/2013 1550   REPTSTATUS PENDING  03/30/2013 1550   REPTSTATUS 03/30/2013 FINAL 03/30/2013 1550   Ct Thoracic Spine Wo Contrast  03/29/2013   *RADIOLOGY REPORT*  Clinical Data:  Post fusion T9-L3 1 week ago.  Complaining of worsening back pain.  CT THORACIC AND LUMBAR SPINE WITHOUT CONTRAST  Technique:  Multidetector CT imaging of the thoracic and lumbar spine was performed without contrast. Multiplanar CT image reconstructions were also generated.  Comparison:  Preoperative plain film examination 03/08/2013.  Cross- table lateral view postoperatively 03/16/2013.  CT THORACIC AND LUMBAR SPINE  Findings: Present examination incorporates from C6 to the lower sacrum.  T12 anterior wedge compression deformity with 85% loss of height centrally. Mild retropulsion of the compressed T12 vertebra contributing to the mild spinal stenosis. Mild kyphosis. Kyphosis and retropulsion appear minimally more prominent than on the prior postoperative plain film exam.  Fusion T9-L3.  Bilateral pedicle screws T9, T10, T11, L1, L2 and L3 with posterior connecting bar.  Posterior element radiopaque material consistent with attempted fusion.  Gas is seen within the subcutaneous region and paraspinal region most prominent at the T8 level.  This may represent expected changes given the relatively early postoperative.  This limits evaluation for possibility postoperative infection (to be determined clinically).  The right T11 pedicle screw is immediately adjacent to the lateral aspect of the canal and minimally breach the cortex at this level.  The left L2 pedicle screw minimally breech cortex of the left lateral margin of the canal.  The left L3 pedicle screw enters the posterior aspect of the left L3-4 neural foramen immediately posterior to the exiting left L3 nerve  root (series 6 image 121).  Limited evaluation of the canal contents secondary to streak artifact from metallic hardware and lack of intrathecal contrast. No obvious epidural hematoma or abscess however, if  this were of high clinical concern then MR with contrast is recommended.  L1-2:  Mild bilateral foraminal narrowing.  L2-3:  Mild retrolisthesis L2.  Bulge.  Facet joint degenerative changes.  Ligamentum flavum hypertrophy.  Multifactorial moderate spinal stenosis.  Mild bilateral foraminal narrowing.  L3-4:  Bulge.  Facet joint degenerative changes.  Ligamentum flavum hypertrophy.  Moderate multifactorial spinal stenosis and mild bilateral foraminal narrowing.  L4-5:  Facet joint degenerative changes.  Minimal anterior slip. Bulge.  Mild to moderate spinal stenosis and mild bilateral foraminal narrowing.  Prominent coronary artery calcifications.  Atherosclerotic type changes of the thoracic aorta with prominent calcification.  Calcification of the origin of the great vessels.  Bibasilar atelectatic changes.  Moderate sized hiatal hernia.  Atherosclerotic type changes of the abdominal aorta with fusiform dilation measuring up to 2.8 cm.  Ectatic calcified iliac arteries. Aortic branch vessel atherosclerotic type changes.  IMPRESSION: T12 anterior wedge compression deformity with 85% loss of height centrally. Mild retropulsion of the compressed T12 vertebra contributing to the mild spinal stenosis. Mild kyphosis. Kyphosis and retropulsion appear minimally more prominent than on the prior postoperative plain film exam.  Fusion T9-L3.  Bilateral pedicle screws T9, T10, T11, L1, L2 and L3 with posterior connecting bar.  Posterior element radiopaque material consistent with attempted fusion.  Gas is seen within the subcutaneous region and paraspinal region most prominent at the T8 level.  This may represent expected changes given the relatively early postoperative.  This limits evaluation for possibility postoperative infection (to be determined clinically).  The left L3 pedicle screw enters the posterior aspect of the left L3-4 neural foramen immediately posterior to the exiting left L3 nerve root (series 6 image 121).   Limited evaluation of the canal contents secondary to streak artifact from metallic hardware and lack of intrathecal contrast. No obvious epidural hematoma or abscess however, if this were of high clinical concern then MR with contrast is recommended.  L1-2 mild bilateral foraminal narrowing.  L2-3 multifactorial moderate spinal stenosis.  Mild bilateral foraminal narrowing.  L3-4 multifactorial spinal stenosis and mild bilateral foraminal narrowing.  L4-5 mild to moderate spinal stenosis and mild bilateral foraminal narrowing.  Prominent coronary artery calcifications.  Atherosclerotic type changes of the thoracic aorta with prominent calcification.  Calcification of the origin of the great vessels.  Bibasilar atelectatic changes.  Moderate sized hiatal hernia.  Atherosclerotic type changes of the abdominal aorta with fusiform dilation measuring up to 2.8 cm.  Ectatic calcified iliac arteries. Aortic branch vessel atherosclerotic type changes.   Original Report Authenticated By: Lacy Duverney, M.D.   Ct Lumbar Spine Wo Contrast  03/29/2013   *RADIOLOGY REPORT*  Clinical Data:  Post fusion T9-L3 1 week ago.  Complaining of worsening back pain.  CT THORACIC AND LUMBAR SPINE WITHOUT CONTRAST  Technique:  Multidetector CT imaging of the thoracic and lumbar spine was performed without contrast. Multiplanar CT image reconstructions were also generated.  Comparison:  Preoperative plain film examination 03/08/2013.  Cross- table lateral view postoperatively 03/16/2013.  CT THORACIC AND LUMBAR SPINE  Findings: Present examination incorporates from C6 to the lower sacrum.  T12 anterior wedge compression deformity with 85% loss of height centrally. Mild retropulsion of the compressed T12 vertebra contributing to the mild spinal stenosis. Mild kyphosis. Kyphosis and retropulsion appear minimally more prominent  than on the prior postoperative plain film exam.  Fusion T9-L3.  Bilateral pedicle screws T9, T10, T11, L1, L2 and  L3 with posterior connecting bar.  Posterior element radiopaque material consistent with attempted fusion.  Gas is seen within the subcutaneous region and paraspinal region most prominent at the T8 level.  This may represent expected changes given the relatively early postoperative.  This limits evaluation for possibility postoperative infection (to be determined clinically).  The right T11 pedicle screw is immediately adjacent to the lateral aspect of the canal and minimally breach the cortex at this level.  The left L2 pedicle screw minimally breech cortex of the left lateral margin of the canal.  The left L3 pedicle screw enters the posterior aspect of the left L3-4 neural foramen immediately posterior to the exiting left L3 nerve root (series 6 image 121).  Limited evaluation of the canal contents secondary to streak artifact from metallic hardware and lack of intrathecal contrast. No obvious epidural hematoma or abscess however, if this were of high clinical concern then MR with contrast is recommended.  L1-2:  Mild bilateral foraminal narrowing.  L2-3:  Mild retrolisthesis L2.  Bulge.  Facet joint degenerative changes.  Ligamentum flavum hypertrophy.  Multifactorial moderate spinal stenosis.  Mild bilateral foraminal narrowing.  L3-4:  Bulge.  Facet joint degenerative changes.  Ligamentum flavum hypertrophy.  Moderate multifactorial spinal stenosis and mild bilateral foraminal narrowing.  L4-5:  Facet joint degenerative changes.  Minimal anterior slip. Bulge.  Mild to moderate spinal stenosis and mild bilateral foraminal narrowing.  Prominent coronary artery calcifications.  Atherosclerotic type changes of the thoracic aorta with prominent calcification.  Calcification of the origin of the great vessels.  Bibasilar atelectatic changes.  Moderate sized hiatal hernia.  Atherosclerotic type changes of the abdominal aorta with fusiform dilation measuring up to 2.8 cm.  Ectatic calcified iliac arteries. Aortic  branch vessel atherosclerotic type changes.  IMPRESSION: T12 anterior wedge compression deformity with 85% loss of height centrally. Mild retropulsion of the compressed T12 vertebra contributing to the mild spinal stenosis. Mild kyphosis. Kyphosis and retropulsion appear minimally more prominent than on the prior postoperative plain film exam.  Fusion T9-L3.  Bilateral pedicle screws T9, T10, T11, L1, L2 and L3 with posterior connecting bar.  Posterior element radiopaque material consistent with attempted fusion.  Gas is seen within the subcutaneous region and paraspinal region most prominent at the T8 level.  This may represent expected changes given the relatively early postoperative.  This limits evaluation for possibility postoperative infection (to be determined clinically).  The left L3 pedicle screw enters the posterior aspect of the left L3-4 neural foramen immediately posterior to the exiting left L3 nerve root (series 6 image 121).  Limited evaluation of the canal contents secondary to streak artifact from metallic hardware and lack of intrathecal contrast. No obvious epidural hematoma or abscess however, if this were of high clinical concern then MR with contrast is recommended.  L1-2 mild bilateral foraminal narrowing.  L2-3 multifactorial moderate spinal stenosis.  Mild bilateral foraminal narrowing.  L3-4 multifactorial spinal stenosis and mild bilateral foraminal narrowing.  L4-5 mild to moderate spinal stenosis and mild bilateral foraminal narrowing.  Prominent coronary artery calcifications.  Atherosclerotic type changes of the thoracic aorta with prominent calcification.  Calcification of the origin of the great vessels.  Bibasilar atelectatic changes.  Moderate sized hiatal hernia.  Atherosclerotic type changes of the abdominal aorta with fusiform dilation measuring up to 2.8 cm.  Ectatic calcified iliac arteries. Aortic  branch vessel atherosclerotic type changes.   Original Report Authenticated  By: Lacy Duverney, M.D.     Recent Results (from the past 720 hour(s))  SURGICAL PCR SCREEN     Status: Abnormal   Collection Time    03/08/13  8:44 AM      Result Value Range Status   MRSA, PCR POSITIVE (*) NEGATIVE Final   Staphylococcus aureus POSITIVE (*) NEGATIVE Final   Comment:            The Xpert SA Assay (FDA     approved for NASAL specimens     in patients over 34 years of age),     is one component of     a comprehensive surveillance     program.  Test performance has     been validated by [employer] for patients greater     than or equal to 101 year old.     It is not intended     to diagnose infection nor to     guide or monitor treatment.  ANAEROBIC CULTURE     Status: None   Collection Time    03/30/13  3:50 PM      Result Value Range Status   Specimen Description WOUND BACK   Final   Special Requests NONE   Final   Gram Stain     Final   Value: MODERATE WBC PRESENT,BOTH PMN AND MONONUCLEAR     NO SQUAMOUS EPITHELIAL CELLS SEEN     NO ORGANISMS SEEN     Performed at [facility]     Performed at [facility]   Culture     Final   Value: NO ANAEROBES ISOLATED; CULTURE IN PROGRESS FOR 5 DAYS     Performed at [facility]   Report Status PENDING   Incomplete  WOUND CULTURE     Status: None   Collection Time    03/30/13  3:50 PM      Result Value Range Status   Specimen Description WOUND BACK   Final   Special Requests NONE   Final   Gram Stain     Final   Value: MODERATE WBC PRESENT,BOTH PMN AND MONONUCLEAR     NO SQUAMOUS EPITHELIAL CELLS SEEN     NO ORGANISMS SEEN     Performed at [facility]     Performed at [facility]   Culture     Final   Value: Culture reincubated for better growth     Performed at [facility]   Report Status PENDING   Incomplete  GRAM STAIN     Status: None   Collection Time    03/30/13  3:50 PM      Result Value Range Status   Specimen Description WOUND BACK   Final     Special Requests NONE   Final   Gram Stain     Final   Value: MODERATE WBC PRESENT,BOTH PMN AND MONONUCLEAR     NO ORGANISMS SEEN   Report Status 03/30/2013 FINAL   Final  MRSA PCR SCREENING     Status: None   Collection Time    03/30/13 10:34 PM      Result Value Range Status   MRSA by PCR NEGATIVE  NEGATIVE Final   Comment:            The GeneXpert MRSA Assay (FDA     approved for NASAL specimens  only), is one component of a     comprehensive MRSA colonization     surveillance program. It is not     intended to diagnose MRSA     infection nor to guide or     monitor treatment for     MRSA infections.     Impression/Recommendation  79 year old with hardware associated T-Lumbar  infected hardware sp removal  #1 Hardware associated T-L osteomyelitis:  --agree with vancomycin and will change from ceftaz to cefepime  --if he has hardware present we should add rifampin 300 mg twice daily.  --If you do not receive and to seek antibiotics prior to cultures being obtained in the operating room and he doesn't grow an organism I will probably simplified to vancomycin and ceftriaxone.  #2 Screening: check HIV and hepatitis C  Thank you so much for this interesting consult  [facility] [phone] (pager) [phone] (office) 03/31/2013, 3:04 PM  Paulette Blanch Dam 03/31/2013, 3:04 PM

## 2013-03-31 NOTE — Progress Notes (Signed)
PULMONARY  / CRITICAL CARE MEDICINE  Name: [name] [name] MRN: [id number] DOB: 1934-06-06    ADMISSION DATE:  03/29/2013 CONSULTATION DATE:  03/30/2013  REFERRING MD :  Shon Baton, neurosurgery PRIMARY SERVICE: NSGY  CHIEF COMPLAINT:  T9-L3 fusion device failure/infection; Post op medical management  BRIEF PATIENT DESCRIPTION: 79 y/o male with CKD, DM2 was admitted on 8/13 for removal of infected spinal hardware placed on 7/30 for a T12 burst fracture.  PCCM consulted for medical management.    SIGNIFICANT EVENTS / STUDIES:  8/13 CT thoracic/lumbar spine> complex report, see results in [facility] 8/13 T9-L3 hardware removed, irrigation and debridement of back wound 8/14 Acute onset sinus tachycardia, hypertension, chills, hypoxemia requiring 1.00 FiO2  LINES / TUBES:  CULTURES: 8/13 wound culture >>  ANTIBIOTICS: 8/13 Vanco >  8/13 Cefazolin > x1 8/13 ceftaz > 8/14 8/14 cefepime 8/14 >>   HISTORY OF PRESENT ILLNESS:  79 y/o male with CKD, DM2 was admitted on 8/13 for removal of infected spinal hardware placed on 7/30 for a T12 burst fracture.  PCCM consulted for medical management.   He presented to NSGY clinic on 8/12 with increased drainage and redness around his spinal wound. Went for hardware removal 8/13.   Apparently the surgery went well without complication. He has been confused but stable until approx 1700 on 8/14 whe he experienced acute change > tachycardia, hypoxemia, hypertension, more confusion. He denies pain except his pre-existing back pain post-op.   SUBJECTIVE:  Pt is able to answer some questions but is confused.  Laying in bed in some resp distress   VITAL SIGNS: Temp:  [97.5 F (36.4 C)-99.3 F (37.4 C)] 98.3 F (36.8 C) (08/14 1720) Pulse Rate:  [80-101] 101 (08/14 1203) Resp:  [9-24] 19 (08/14 1203) BP: (105-150)/(43-95) 150/95 mmHg (08/14 1720) SpO2:  [96 %-99 %] 98 % (08/14 1203) Weight:  [86.7 kg (191 lb 2.2 oz)] 86.7 kg (191 lb 2.2 oz) (08/13  1946) HEMODYNAMICS:   VENTILATOR SETTINGS:   INTAKE / OUTPUT: Intake/Output     08/13 0701 - 08/14 0700 08/14 0701 - 08/15 0700   P.O.  150   I.V. (mL/kg) 900 (10.4)    Blood 383    IV Piggyback 400    Total Intake(mL/kg) 1683 (19.4) 150 (1.7)   Urine (mL/kg/hr) 1300 (0.6) 475 (0.5)   Blood 100 (0)    Total Output 1400 475   Net +283 -325          PHYSICAL EXAMINATION: Temp now > 98.6, HR 140, BP 140/90 Gen: confused, uncomfortable in bed, moderate distress w 1.00 mask in place HEENT: NCAT, PERRL, EOMi, OP clear, MM dry PULM: CTA B CV: regular, tachy, no mgr, no JVD AB: BS+, soft, nontender, no hsm Ext: warm, no edema, no clubbing, no cyanosis Derm: no rash or skin breakdown Neuro: Confused, awake, follows commands, moves all ext   LABS:  CBC Recent Labs     03/29/13  1418  03/30/13  1800  03/31/13  0500  WBC  9.3  7.5  7.5  HGB  10.5*  8.2*  7.9*  HCT  30.7*  22.9*  21.9*  PLT  75*  66*  63*   Coag's Recent Labs     03/29/13  1418  03/31/13  0500  INR  1.51*  1.57*   BMET Recent Labs     03/29/13  1418  03/31/13  0500  NA  135  137  K  4.0  3.4*  CL  98  103  CO2  21  22  BUN  23  22  CREATININE  0.79  0.85  GLUCOSE  260*  99   Electrolytes Recent Labs     03/29/13  1418  03/31/13  0500  CALCIUM  8.8  8.7   Sepsis Markers No results found for this basename: LACTICACIDVEN, PROCALCITON, O2SATVEN,  in the last 72 hours ABG No results found for this basename: PHART, PCO2ART, PO2ART,  in the last 72 hours Liver Enzymes No results found for this basename: AST, ALT, ALKPHOS, BILITOT, ALBUMIN,  in the last 72 hours Cardiac Enzymes No results found for this basename: TROPONINI, PROBNP,  in the last 72 hours Glucose Recent Labs     03/30/13  1443  03/30/13  1728  03/31/13  0028  03/31/13  0420  03/31/13  0754  03/31/13  1200  GLUCAP  114*  104*  129*  95  115*  113*    Imaging   CXR:  8/14 >> basilar atx, some vascular prominence,  no consolidation  ASSESSMENT / PLAN:  INFECTIOUS A:  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 At risk for occult shock and hypoperfusion P:   -Vanc -f/u wound cultures -ID consult 8/14 > abx expanded to vanco + cefepime -BMP, CBC, lactate, Pct now   PULMONARY A: Acute hypoxemic resp failure, without CP but associated with tachycardia, hypertension.  P:   -ECG, serial trop -CXR now -given the precipitous increase in Fio2 needs, feel that we are obliged to r/o PE > CT-PA ordered    CARDIOVASCULAR A: Hypertension Hyperlipidemia P:  -home BP regimen on hold -statin on hold  RENAL A:  BPH P:   -restart flomax when stable  GASTROINTESTINAL A:  No acute issues P:   -advance diet per surgery  HEMATOLOGIC A:  Post op anemia Thrombocytopenia P:  -monitor for bleeding -f/u CBC now and in AM -transfusion threshold is Hgb < 7   ENDOCRINE A:  DM2 P:   -agree with SSI for now -restart home oral agents when eating  NEUROLOGIC A:  Spinal hardware infection P:   -as above  TODAY'S SUMMARY: 79 y/o male with DM2 with infection of recently placed spinal hardware; Acute hypoxemia and apparent SIRS/sepsis on 8/14 pm. Await labs, CXR, ECG, CT-PA. Consider move to ICU if clinically changing.   CC time 45 minutes  Levy Pupa, MD, PhD 03/31/2013, 6:10 PM [facility] [phone] or if no answer [phone]

## 2013-03-31 NOTE — Progress Notes (Signed)
Clinical Social Work Department BRIEF PSYCHOSOCIAL ASSESSMENT 03/31/2013  Patient:  [name] [name],[name] [name]     Account Number:  [id number]     Admit date:  03/29/2013  Clinical Social Worker:  Leron Croak, CLINICAL SOCIAL WORKER  Date/Time:  03/31/2013 11:09 AM  Referred by:  Physician  Date Referred:  03/31/2013 Referred for  SNF Placement   Other Referral:   Interview type:  Patient Other interview type:    PSYCHOSOCIAL DATA Living Status:  FACILITY Admitted from facility:  [facility] Level of care:  Independent Living Primary support name:  Micah Flesher [phone] Primary support relationship to patient:  CHILD, ADULT Degree of support available:   Pt has good support from facility, wife and daughter    CURRENT CONCERNS Current Concerns  Post-Acute Placement   Other Concerns:   Pt is in independent living and may need SNF placement    SOCIAL WORK ASSESSMENT / PLAN CSW met with the Pt to confirm current living arrangements. CSW introduced self and purpose for visit. CSW questioned Pt about current arrangements and Pt stated that he is from [facility] and is in the Independent living side. Pt is aware that he may need SNF and is ok for CSW to speak with facility to see if they would be able to provide that care if necessary. CSW is waiting on PT/OT eval for further assistance.   Assessment/plan status:  Information/Referral to [facility] Other assessment/ plan:   Information/referral to community resources:   No additional information is necessary at this time.    PATIENT'S/FAMILY'S RESPONSE TO PLAN OF CARE: Pt was appreciative for visit and assistance.     Leron Croak, LCSWA [facility]  [phone]

## 2013-03-31 NOTE — Op Note (Signed)
[name] [name], [name] [name]             ACCOUNT NO.:  [id number]  MEDICAL RECORD NO.:  [id number]  LOCATION:  3S12C                        FACILITY:  [facility]  PHYSICIAN:  Alvy Beal, MD    DATE OF BIRTH:  April 26, 1934  DATE OF PROCEDURE:  03/30/2013 DATE OF DISCHARGE:                              OPERATIVE REPORT   PREOPERATIVE DIAGNOSES:  Hardware failure and wound infection.  The patient is status post T9-L3 instrumented fusion 3 weeks ago.  HISTORY:  This is a very pleasant 79 year old gentleman who is under my care for a T12 burst fracture for some time.  He ultimately failed conservative management.  About 2 weeks ago, he had about T9-L3 instrumented fusion.  The patient initially did well during surgery. All screws had excellent purchase and fixation.  He returned, was brought back to the office yesterday by his daughter with obvious deformity of the upper portion of his wound, and drainage that was quite significant.  X-rays demonstrated the hardware had migrated out. Although, the incision itself was intact,  I was concerned about a possible infection.  The patient was admitted.  He was taken to the OR today for a formal I and D and removal of hardware.  OPERATIVE NOTE:  The patient was brought to the operating room, placed supine on the operating table.  After successful induction of general anesthesia and endotracheal intubation, TEDs, SCDs, were applied.  He was placed supine on the flat Jackson frame, and all bony prominences were well padded.  The spine frame was secured over the patient.  He was rotated into the prone position.  The back was then prepped and draped in a standard fashion.  Time-out was done confirming the patient, procedure, and all other pertinent important data.  Once this was completed, I then removed the Monocryl stitch and opened the wound again.  There was frank serosanguineous/slightly purulent material that was removed.  I did take intraoperative  cultures.  There was no foul odor.  I then opened up the deep fascial wound and exposed the hardware. At this point, I noted again that there was some drainage and purulent material deep to the fascia.  At this point, I then debrided the wound edges until I had bleeding tissue and removed all devitalized tissue.  I then removed all of the top locks and then removed the rod.  I then was able to manually just remove multiple screws as they were all migrated and loose.  Once all the hardware was removed, I then irrigated again with another 6 L for total of 9 L of fluid.  I then reapproximated the deep fascial tissue with #1 PDS, and then closed superficial with a running Vicryl #1 vertical mattress PDS.  I then placed a VAC over the wound itself and sealed it.  The patient was then extubated and transferred to the PACU without incident.  At the end of the case, all needle and sponge counts were correct and there was no adverse intraoperative events.     Alvy Beal, MD     DDB/MEDQ  D:  03/30/2013  T:  03/31/2013  Job:  [id number]  cc:   Hassan Rowan.

## 2013-03-31 NOTE — Progress Notes (Signed)
Pt. Transferred with belongings and TLSO brace. Transferred via bed with 2 RN escorts. 15L non rebreather mask, tele, wound vac and SCDs. Pt. Tolerated transfer well. ICU to take over direct patient care at this time, plan CTA when available.

## 2013-03-31 NOTE — Evaluation (Signed)
Occupational Therapy Evaluation Patient Details Name: [name] [name] MRN: [id number] DOB: 1933-10-05 Today's Date: 03/31/2013 Time: 1540-1600 OT Time Calculation (min): 20 min  OT Assessment / Plan / Recommendation History of present illness Pt readmitted after recent T9-L3 fusion due to T12 burst fx.  Pt with failure of hardware and wound infection and underwent I&D and hardware removal on 03/30/13.   Clinical Impression   Pt admitted with above. Pt very withdrawn during session and limited participation. Will continue to follow pt acutely in order to address below problem list. Recommending SNF for d/c planning.    OT Assessment  Patient needs continued OT Services    Follow Up Recommendations  SNF;Supervision/Assistance - 24 hour    Barriers to Discharge      Equipment Recommendations  3 in 1 bedside comode    Recommendations for Other Services    Frequency  Min 2X/week    Precautions / Restrictions Precautions Precautions: Back;Fall Required Braces or Orthoses: Spinal Brace Spinal Brace: Thoracolumbosacral orthotic;Applied in sitting position Restrictions Weight Bearing Restrictions: No   Pertinent Vitals/Pain See vitals    ADL  Eating/Feeding: Performed;Moderate assistance Where Assessed - Eating/Feeding: Edge of bed Upper Body Bathing: Simulated;Maximal assistance Where Assessed - Upper Body Bathing: Supported sitting Lower Body Bathing: Simulated;+1 Total assistance Where Assessed - Lower Body Bathing: Supported sitting Upper Body Dressing: Simulated;+1 Total assistance Where Assessed - Upper Body Dressing: Supported sitting Lower Body Dressing: Performed;+1 Total assistance Where Assessed - Lower Body Dressing: Supported sitting Equipment Used: Back brace ADL Comments: Total assist to donn/doff back brace. Pt very withdrawn and resistant to participating in ADLs.    OT Diagnosis: Generalized weakness;Cognitive deficits;Acute pain  OT Problem List: Decreased  strength;Decreased activity tolerance;Impaired balance (sitting and/or standing);Decreased safety awareness;Decreased knowledge of use of DME or AE;Decreased knowledge of precautions;Pain;Decreased cognition OT Treatment Interventions: Self-care/ADL training;DME and/or AE instruction;Therapeutic activities;Patient/family education;Balance training;Cognitive remediation/compensation   OT Goals(Current goals can be found in the care plan section) Acute Rehab OT Goals Patient Stated Goal: pt did not state OT Goal Formulation: With patient Time For Goal Achievement: 04/14/13 Potential to Achieve Goals: Good  Visit Information  Last OT Received On: 03/31/13 Assistance Needed: +2 History of Present Illness: Pt readmitted after recent T9-L3 fusion due to T12 burst fx.  Pt with failure of hardware and wound infection and underwent I&D and hardware removal on 03/30/13.       Prior Functioning     Home Living Family/patient expects to be discharged to:: Private residence Living Arrangements: Spouse/significant other (spouse with dementia) Available Help at Discharge: Family Type of Home: Apartment Home Access: Level entry Home Layout: One level Home Equipment: Cane - single point Additional Comments: Just moved to [facility]. Prior Function Level of Independence: Independent Comments: Prior to initial back surgery several weeks ago.  At dc from hospital pt amb 250' with rolling walker with supervision. Dominant Hand: Right         Vision/Perception     Cognition  Cognition Arousal/Alertness: Awake/alert Behavior During Therapy: Flat affect Overall Cognitive Status: Impaired/Different from baseline Area of Impairment: Following commands Following Commands: Follows one step commands inconsistently Safety/Judgement: Decreased awareness of safety General Comments: Pt appears withdrawn and very minimal participation in therapy.    Extremity/Trunk Assessment Upper Extremity  Assessment Upper Extremity Assessment: Generalized weakness Lower Extremity Assessment Lower Extremity Assessment: Generalized weakness     Mobility Bed Mobility Bed Mobility: Rolling Left;Left Sidelying to Sit;Sitting - Scoot to Edge of Bed;Sit to Sidelying Left Rolling  Left: 1: +2 Total assist;With rail Rolling Left: Patient Percentage: 0% Left Sidelying to Sit: 1: +2 Total assist;With rails Left Sidelying to Sit: Patient Percentage: 0% Sitting - Scoot to Edge of Bed: 1: +2 Total assist Sitting - Scoot to Edge of Bed: Patient Percentage: 0% Sit to Supine: 1: +2 Total assist Details for Bed Mobility Assistance: Assist for all aspects of bed mobility. Pt resistant to mobility and attempting to draw LEs back up into bed.     Exercise     Balance Balance Balance Assessed: Yes Static Sitting Balance Static Sitting - Balance Support: Bilateral upper extremity supported;Feet supported Static Sitting - Level of Assistance: 4: Min assist   End of Session OT - End of Session Equipment Utilized During Treatment: Back brace Activity Tolerance: Patient limited by fatigue;Patient limited by pain Patient left: in bed;with call bell/phone within reach Nurse Communication: Mobility status  GO    03/31/2013 Cipriano Mile OTR/L Pager [phone] Office [phone]  Cipriano Mile 03/31/2013, 4:48 PM

## 2013-03-31 NOTE — Progress Notes (Signed)
ANTIBIOTIC CONSULT NOTE - follow up Pharmacy Consult for vancomycin and change fortaz to cefepime Indication: infected spinal hardware  No Known Allergies  Patient Measurements: Height: 5\' 8"  (172.7 cm) Weight: 191 lb 2.2 oz (86.7 kg) IBW/kg (Calculated) : 68.4  Vital Signs: Temp: 97.7 F (36.5 C) (08/14 1203) Temp src: Oral (08/14 1203) BP: 125/60 mmHg (08/14 1203) Pulse Rate: 101 (08/14 1203) Intake/Output from previous day: 08/13 0701 - 08/14 0700 In: 1683 [I.V.:900; Blood:383; IV Piggyback:400] Out: 1400 [Urine:1300; Blood:100] Intake/Output from this shift: Total I/O In: 150 [P.O.:150] Out: 275 [Urine:275]  Labs:  Recent Labs  03/29/13 1418 03/30/13 1800 03/31/13 0500  WBC 9.3 7.5 7.5  HGB 10.5* 8.2* 7.9*  PLT 75* 66* 63*  CREATININE 0.79  --  0.85   Estimated Creatinine Clearance: 79.2 ml/min (by C-G formula based on Cr of 0.85). No results found for this basename: VANCOTROUGH, Leodis Binet, VANCORANDOM, GENTTROUGH, GENTPEAK, GENTRANDOM, TOBRATROUGH, TOBRAPEAK, TOBRARND, AMIKACINPEAK, AMIKACINTROU, AMIKACIN,  in the last 72 hours   Microbiology: Recent Results (from the past 720 hour(s))  SURGICAL PCR SCREEN     Status: Abnormal   Collection Time    03/08/13  8:44 AM      Result Value Range Status   MRSA, PCR POSITIVE (*) NEGATIVE Final   Staphylococcus aureus POSITIVE (*) NEGATIVE Final   Comment:            The Xpert SA Assay (FDA     approved for NASAL specimens     in patients over 67 years of age),     is one component of     a comprehensive surveillance     program.  Test performance has     been validated by [employer] for patients greater     than or equal to 33 year old.     It is not intended     to diagnose infection nor to     guide or monitor treatment.  ANAEROBIC CULTURE     Status: None   Collection Time    03/30/13  3:50 PM      Result Value Range Status   Specimen Description WOUND BACK   Final   Special Requests NONE   Final    Gram Stain     Final   Value: MODERATE WBC PRESENT,BOTH PMN AND MONONUCLEAR     NO SQUAMOUS EPITHELIAL CELLS SEEN     NO ORGANISMS SEEN     Performed at [facility]     Performed at [facility]   Culture     Final   Value: NO ANAEROBES ISOLATED; CULTURE IN PROGRESS FOR 5 DAYS     Performed at [facility]   Report Status PENDING   Incomplete  WOUND CULTURE     Status: None   Collection Time    03/30/13  3:50 PM      Result Value Range Status   Specimen Description WOUND BACK   Final   Special Requests NONE   Final   Gram Stain     Final   Value: MODERATE WBC PRESENT,BOTH PMN AND MONONUCLEAR     NO SQUAMOUS EPITHELIAL CELLS SEEN     NO ORGANISMS SEEN     Performed at [facility]     Performed at [facility]   Culture     Final   Value: Culture reincubated for better growth     Performed at [facility]  Report Status PENDING   Incomplete  GRAM STAIN     Status: None   Collection Time    03/30/13  3:50 PM      Result Value Range Status   Specimen Description WOUND BACK   Final   Special Requests NONE   Final   Gram Stain     Final   Value: MODERATE WBC PRESENT,BOTH PMN AND MONONUCLEAR     NO ORGANISMS SEEN   Report Status 03/30/2013 FINAL   Final  MRSA PCR SCREENING     Status: None   Collection Time    03/30/13 10:34 PM      Result Value Range Status   MRSA by PCR NEGATIVE  NEGATIVE Final   Comment:            The GeneXpert MRSA Assay (FDA     approved for NASAL specimens     only), is one component of a     comprehensive MRSA colonization     surveillance program. It is not     intended to diagnose MRSA     infection nor to guide or     monitor treatment for     MRSA infections.   Assessment: 79 year old male with infected spinal hardware now s/p removal. Vancomycin/fortaz ordered post-op for broad coverage. ID consulted - will change fortaz to cefepime and will add rifampin 300 mg po bid if hardware is  present. 8/13 back wound   Goal of Therapy:  Vancomycin trough level 15-20 mcg/ml Eradication of infection  Plan:  Continue Vancomycin 1g IV q12 hours and check steady-state trough DC Fortaz Start Cefepime 2 gm IV q8h Follow cultures and renal function Herby Abraham, Pharm.D. [phone] 03/31/2013 3:39 PM

## 2013-03-31 NOTE — Evaluation (Signed)
Physical Therapy Evaluation Patient Details Name: [name] [name] MRN: [id number] DOB: 22-Nov-1933 Today's Date: 03/31/2013 Time: 1540-1600 PT Time Calculation (min): 20 min  PT Assessment / Plan / Recommendation History of Present Illness  Pt readmitted after recent T9-L3 fusion due to T12 burst fx.  Pt with failure of hardware and wound infection and underwent I&D and hardware removal on 03/30/13.  Clinical Impression  Pt admitted with above. Pt currently with functional limitations due to the deficits listed below (see PT Problem List).  Pt will benefit from skilled PT to increase their independence and safety with mobility to allow discharge to the venue listed below.       PT Assessment  Patient needs continued PT services    Follow Up Recommendations  SNF    Does the patient have the potential to tolerate intense rehabilitation      Barriers to Discharge Decreased caregiver support      Equipment Recommendations       Recommendations for Other Services     Frequency Min 5X/week    Precautions / Restrictions Precautions Precautions: Back;Fall Required Braces or Orthoses: Spinal Brace Spinal Brace: Thoracolumbosacral orthotic;Applied in sitting position Restrictions Weight Bearing Restrictions: No   Pertinent Vitals/Pain Back pain. Pt medicated.      Mobility  Bed Mobility Bed Mobility: Rolling Left;Left Sidelying to Sit;Sitting - Scoot to [place] of Bed;Sit to Sidelying Left Rolling Left: 1: +2 Total assist;With rail Rolling Left: Patient Percentage: 0% Left Sidelying to Sit: 1: +2 Total assist;With rails Left Sidelying to Sit: Patient Percentage: 0% Sitting - Scoot to Edge of Bed: 1: +2 Total assist Sitting - Scoot to Edge of Bed: Patient Percentage: 0% Sit to Supine: 1: +2 Total assist Sit to Supine: Patient Percentage: 0% Details for Bed Mobility Assistance: Assist for all aspects of bed mobility. Pt resistant to mobility and attempting to draw LEs back up into  bed. Transfers Details for Transfer Assistance: Attempted to stand pt with +2 total but pt did not attempt to assist in any way.    Exercises     PT Diagnosis: Difficulty walking;Generalized weakness;Acute pain  PT Problem List: Decreased strength;Decreased activity tolerance;Decreased balance;Decreased mobility;Pain;Decreased knowledge of precautions;Decreased knowledge of use of DME;Decreased cognition PT Treatment Interventions: DME instruction;Gait training;Functional mobility training;Therapeutic activities;Therapeutic exercise;Patient/family education;Balance training;Cognitive remediation     PT Goals(Current goals can be found in the care plan section) Acute Rehab PT Goals Patient Stated Goal: pt did not state PT Goal Formulation: Patient unable to participate in goal setting Time For Goal Achievement: 04/07/13 Potential to Achieve Goals: Fair  Visit Information  Last PT Received On: 03/31/13 Assistance Needed: +2 History of Present Illness: Pt readmitted after recent T9-L3 fusion due to T12 burst fx.  Pt with failure of hardware and wound infection and underwent I&D and hardware removal on 03/30/13.       Prior Functioning  Home Living Family/patient expects to be discharged to:: Private residence Living Arrangements: Spouse/significant other (spouse with dementia) Available Help at Discharge: Family Type of Home: Apartment Home Access: Level entry Home Layout: One level Home Equipment: Cane - single point Additional Comments: Just moved to [facility]. Prior Function Level of Independence: Independent Comments: Prior to initial back surgery several weeks ago.  At dc from hospital pt amb 250' with rolling walker with supervision. Dominant Hand: Right    Cognition  Cognition Arousal/Alertness: Awake/alert Behavior During Therapy: Flat affect Overall Cognitive Status: Impaired/Different from baseline Area of Impairment: Following commands Following Commands:  Follows  one step commands inconsistently Safety/Judgement: Decreased awareness of safety General Comments: Pt appears withdrawn and very minimal participation in therapy.    Extremity/Trunk Assessment Upper Extremity Assessment Upper Extremity Assessment: Generalized weakness Lower Extremity Assessment Lower Extremity Assessment: Generalized weakness   Balance Balance Balance Assessed: Yes Static Sitting Balance Static Sitting - Balance Support: Bilateral upper extremity supported;Feet supported Static Sitting - Level of Assistance: 4: Min assist  End of Session PT - End of Session Equipment Utilized During Treatment: Back brace Activity Tolerance: Patient limited by pain Patient left: in bed;with call bell/phone within reach Nurse Communication: Mobility status  GP     [facility] 03/31/2013, 4:56 PM  [employer] PT [phone]

## 2013-03-31 NOTE — Progress Notes (Signed)
    Subjective: Procedure(s) (LRB): HARDWARE REMOVAL/IRRIGATION & DEBRIDEMENT OF WOUND ON BACK (N/A) 1 Day Post-Op  Patient reports pain as 3 on 0-10 scale and mild.  Reports decreased leg pain reports incisional back pain   Positive void Negative bowel movement Positive flatus Negative chest pain or shortness of breath  Objective: Vital signs in last 24 hours: Temp:  [97.5 F (36.4 C)-99.3 F (37.4 C)] 97.7 F (36.5 C) (08/14 1203) Pulse Rate:  [80-101] 101 (08/14 1203) Resp:  [9-24] 19 (08/14 1203) BP: (98-135)/(23-76) 125/60 mmHg (08/14 1203) SpO2:  [95 %-99 %] 98 % (08/14 1203) Weight:  [86.7 kg (191 lb 2.2 oz)] 86.7 kg (191 lb 2.2 oz) (08/13 1946)  Intake/Output from previous day: 08/13 0701 - 08/14 0700 In: 1683 [I.V.:900; Blood:383; IV Piggyback:400] Out: 1400 [Urine:1300; Blood:100]  Labs:  Recent Labs  03/30/13 1800 03/31/13 0500  WBC 7.5 7.5  RBC 2.60* 2.49*  HCT 22.9* 21.9*  PLT 66* 63*    Recent Labs  03/29/13 1418 03/31/13 0500  NA 135 137  K 4.0 3.4*  CL 98 103  CO2 21 22  BUN 23 22  CREATININE 0.79 0.85  GLUCOSE 260* 99  CALCIUM 8.8 8.7    Recent Labs  03/29/13 1418 03/31/13 0500  INR 1.51* 1.57*    Physical Exam: Neurologically intact ABD soft Neurovascular intact Intact pulses distally Incision: dressing C/D/I and no drainage Compartment soft  Assessment/Plan: Patient stable  xrays N/A Continue mobilization with physical therapy Continue care  Advance diet 1. Appreciate ID consult.  Abx adjusted.  Patient does not have hardware present.  Will order PICC line if IV abx course will be greater than 1-2 weeks 2. Advance diet per nutrition team 3. Wound vac functioning - will change dressing Friday. 4. Possible transfer to floor Friday if cleared by CCM  Venita Lick, MD [facility] [phone]

## 2013-03-31 NOTE — Progress Notes (Signed)
Utilization review completed.  

## 2013-03-31 NOTE — Progress Notes (Signed)
PT Cancellation Note  Patient Details Name: [name] [name] MRN: [id number] DOB: 1934-03-08   Cancelled Treatment:    Reason Eval/Treat Not Completed: Other (comment) (Waiting for delivery of TLSO.  Will check back later.)   Minette Manders 03/31/2013, 10:54 AM

## 2013-03-31 NOTE — Consult Note (Signed)
WOC consult Note Reason for Consult: evaluation of sacral wounds, however noted to be more in the gluteal skin fold. Noted this patient has incision NPWT the length of the spine from spinal surgery, not requested to follow for this reason.  Wound type: stage II pressure ulcer, left inner buttock(glueteal fold) Pressure Ulcer POA: Yes  Measurement: 1.5cm x 1.0cm x 0.2cm Wound bed: pink, moist with epithelial buds throughout the wound bed Drainage (amount, consistency, odor) none Periwound:intact Dressing procedure/placement/frequency: silicone foam in place, will continue this for insulation and protection of this wound to encourage wound healing.  Turn off and reposition frequently.    Discussed POC with patient and bedside nurse.  Re consult if needed, will not follow at this time. Thanks  Acelin Ferdig [employer], CWOCN [phone])

## 2013-04-01 DIAGNOSIS — B9689 Other specified bacterial agents as the cause of diseases classified elsewhere: Secondary | ICD-10-CM

## 2013-04-01 LAB — COMPREHENSIVE METABOLIC PANEL
Albumin: 1.5 g/dL — ABNORMAL LOW (ref 3.5–5.2)
BUN: 34 mg/dL — ABNORMAL HIGH (ref 6–23)
Calcium: 8.9 mg/dL (ref 8.4–10.5)
Creatinine, Ser: 1.07 mg/dL (ref 0.50–1.35)
Potassium: 3.5 mEq/L (ref 3.5–5.1)
Total Protein: 4.4 g/dL — ABNORMAL LOW (ref 6.0–8.3)

## 2013-04-01 LAB — CBC WITH DIFFERENTIAL/PLATELET
Eosinophils Relative: 0 % (ref 0–5)
HCT: 21 % — ABNORMAL LOW (ref 39.0–52.0)
Hemoglobin: 7.4 g/dL — ABNORMAL LOW (ref 13.0–17.0)
Lymphocytes Relative: 3 % — ABNORMAL LOW (ref 12–46)
Lymphs Abs: 0.4 10*3/uL — ABNORMAL LOW (ref 0.7–4.0)
MCV: 86.1 fL (ref 78.0–100.0)
Monocytes Relative: 3 % (ref 3–12)
Neutro Abs: 11.8 10*3/uL — ABNORMAL HIGH (ref 1.7–7.7)
RBC: 2.44 MIL/uL — ABNORMAL LOW (ref 4.22–5.81)
WBC: 12.6 10*3/uL — ABNORMAL HIGH (ref 4.0–10.5)

## 2013-04-01 LAB — GLUCOSE, CAPILLARY
Glucose-Capillary: 121 mg/dL — ABNORMAL HIGH (ref 70–99)
Glucose-Capillary: 101 mg/dL — ABNORMAL HIGH (ref 70–99)
Glucose-Capillary: 146 mg/dL — ABNORMAL HIGH (ref 70–99)

## 2013-04-01 LAB — PROCALCITONIN: Procalcitonin: 25.12 ng/mL

## 2013-04-01 LAB — TROPONIN I: Troponin I: <0.3 ng/mL (ref <0.30)

## 2013-04-01 LAB — SEDIMENTATION RATE: Sed Rate: 53 mm/hr — ABNORMAL HIGH (ref 0–16)

## 2013-04-01 LAB — LACTIC ACID, PLASMA: Lactic Acid, Venous: 4.4 mmol/L — ABNORMAL HIGH (ref 0.5–2.2)

## 2013-04-01 LAB — C-REACTIVE PROTEIN: CRP: 28.8 mg/dL — ABNORMAL HIGH (ref <0.60)

## 2013-04-01 MED ORDER — SODIUM CHLORIDE 0.9 % IV SOLN
INTRAVENOUS | Status: DC
  Administered 2013-04-02 – 2013-04-03 (×3): via INTRAVENOUS

## 2013-04-01 MED ORDER — INSULIN ASPART 100 UNIT/ML ~~LOC~~ SOLN
3.0000 [IU] | Freq: Three times a day (TID) | SUBCUTANEOUS | Status: DC
  Administered 2013-04-02 – 2013-04-06 (×3): 3 [IU] via SUBCUTANEOUS

## 2013-04-01 MED ORDER — BIOTENE DRY MOUTH MT LIQD
15.0000 mL | Freq: Two times a day (BID) | OROMUCOSAL | Status: DC
  Administered 2013-04-01 – 2013-04-06 (×9): 15 mL via OROMUCOSAL

## 2013-04-01 MED ORDER — INSULIN ASPART 100 UNIT/ML ~~LOC~~ SOLN
0.0000 [IU] | Freq: Three times a day (TID) | SUBCUTANEOUS | Status: DC
  Administered 2013-04-02 (×3): 1 [IU] via SUBCUTANEOUS
  Administered 2013-04-03: 2 [IU] via SUBCUTANEOUS
  Administered 2013-04-03 – 2013-04-05 (×3): 1 [IU] via SUBCUTANEOUS
  Administered 2013-04-05: 2 [IU] via SUBCUTANEOUS
  Administered 2013-04-05: 1 [IU] via SUBCUTANEOUS
  Administered 2013-04-06 (×2): 3 [IU] via SUBCUTANEOUS
  Administered 2013-04-06 – 2013-04-07 (×3): 1 [IU] via SUBCUTANEOUS

## 2013-04-01 MED ORDER — INSULIN ASPART 100 UNIT/ML ~~LOC~~ SOLN
0.0000 [IU] | Freq: Every day | SUBCUTANEOUS | Status: DC
  Administered 2013-04-02: 1 [IU] via SUBCUTANEOUS

## 2013-04-01 NOTE — Progress Notes (Addendum)
PULMONARY  / CRITICAL CARE MEDICINE  Name: [name] [name] MRN: [id number] DOB: 05-Mar-1934    ADMISSION DATE:  03/29/2013 CONSULTATION DATE:  03/30/2013  REFERRING MD :  Shon Baton, neurosurgery PRIMARY SERVICE: NSGY  CHIEF COMPLAINT:  T9-L3 fusion device failure/infection; Post op medical management  BRIEF PATIENT DESCRIPTION: 79 y/o male with CKD, DM2 was admitted on 8/13 for removal of infected spinal hardware placed on 7/30 for a T12 burst fracture.  PCCM consulted for medical management.    SIGNIFICANT EVENTS / STUDIES:  8/13 CT thoracic/lumbar spine> complex report, see results in [facility] 8/13 T9-L3 hardware removed, irrigation and debridement of back wound 8/14 Acute onset sinus tachycardia, hypertension, chills, hypoxemia requiring 1.00 FiO2 8/14 CT chest>>>no pe, hazy rul area 8/15- resolved symptoms  LINES / TUBES:  CULTURES: 8/13 wound culture >>few gram neg rods  ANTIBIOTICS: 8/13 Vanco >  8/13 Cefazolin > x1 8/13 ceftaz > 8/14 8/14 cefepime 8/14 >>    SUBJECTIVE:  Improved status  VITAL SIGNS: Temp:  [97.5 F (36.4 C)-99.5 F (37.5 C)] 97.5 F (36.4 C) (08/15 0745) Pulse Rate:  [85-114] 87 (08/15 0940) Resp:  [13-30] 21 (08/15 0940) BP: (86-150)/(43-95) 116/86 mmHg (08/15 0940) SpO2:  [90 %-100 %] 100 % (08/15 0940) FiO2 (%):  [100 %] 100 % (08/15 0800) HEMODYNAMICS:   VENTILATOR SETTINGS: Vent Mode:  [-]  FiO2 (%):  [100 %] 100 % INTAKE / OUTPUT: Intake/Output     08/14 0701 - 08/15 0700 08/15 0701 - 08/16 0700   P.O. 150    I.V. (mL/kg) 680 (7.8) 170 (2)   Blood     Other 550 100   IV Piggyback 100    Total Intake(mL/kg) 1480 (17.1) 270 (3.1)   Urine (mL/kg/hr) 475 (0.2)    Blood     Total Output 475 0   Net +1005 +270          PHYSICAL EXAMINATION: Gen: confused, no distress HEENT: NCAT, PERRL PULM: CTA Bilateral CV: regular, s1 s2  No r/m AB: BS+, soft, nontender, no hsm Ext: warm, no edema Derm: no rash or skin breakdown Neuro:  Confused, awake, follows commands, moves all ext   LABS:  CBC Recent Labs     03/31/13  0500  03/31/13  1758  03/31/13  2200  WBC  7.5  4.0  9.4  HGB  7.9*  7.3*  7.6*  HCT  21.9*  21.1*  21.5*  PLT  63*  30*  41*   Coag's Recent Labs     03/29/13  1418  03/31/13  0500  INR  1.51*  1.57*   BMET Recent Labs     03/31/13  0500  03/31/13  1758  03/31/13  2200  NA  137  136  136  K  3.4*  3.6  3.5  CL  103  100  102  CO2  22  17*  20  BUN  22  23  29*  CREATININE  0.85  0.87  1.12  GLUCOSE  99  72  71   Electrolytes Recent Labs     03/31/13  0500  03/31/13  1758  03/31/13  2200  CALCIUM  8.7  8.5  8.7   Sepsis Markers Recent Labs     03/31/13  1757  PROCALCITON  5.38   ABG Recent Labs     03/31/13  1747  PHART  7.341*  PCO2ART  30.9*  PO2ART  173.0*   Liver Enzymes No results found for  this basename: AST, ALT, ALKPHOS, BILITOT, ALBUMIN,  in the last 72 hours Cardiac Enzymes Recent Labs     03/31/13  1758  04/01/13  0030  TROPONINI  <0.30  <0.30   Glucose Recent Labs     03/31/13  1200  03/31/13  1657  03/31/13  2003  04/01/13  0029  04/01/13  0413  04/01/13  0744  GLUCAP  113*  107*  65*  104*  121*  127*    Imaging   CXR:  8/14 >> basilar atx, some vascular prominence, no consolidation  ASSESSMENT / PLAN:  INFECTIOUS A:  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 At risk for occult shock and hypoperfusion 8/14- sirs response P:   -per ID -improved  PULMONARY A: Acute hypoxemic resp failure, without CP but associated with tachycardia, hypertension.  NO PE P:   -On low O2 needs -pcxr in am  -IS , upright -unimpressed with CT RUL area -treat infection  CARDIOVASCULAR A: Hypertension Hyperlipidemia Sepsis present on admission and throughout stay P:  -home BP regimen on hold, unable to restart -statin on hold -tele maintain -lactic  repeat  RENAL A:  BPH, ARF, likely ATN P:   -Continue saline at 85 cc/hr -chem in am, follow for hyperchloremia -treat sepsis  GASTROINTESTINAL A:  R/o dysphagia P:   -advance diet per surgery -slp needed  HEMATOLOGIC A:  Post op anemia Thrombocytopenia (sepsis?) P:  -monitor for bleeding -f/u CBC awaited plat -transfusion threshold is Hgb < 7 -scd  ENDOCRINE A:  DM2 P:   -ssi -restart home oral agents when eating, awaited  NEUROLOGIC A:  Spinal hardware infection P:   Per Ortho / back  TODAY'S SUMMARY:  IMproved today, CT neg, was this all SIRS reponse, to sdu, will continue to follow or have triad manage medical issues   Mcarthur Rossetti. Tyson Alias, MD, FACP Pgr: [phone] [facility]

## 2013-04-01 NOTE — Progress Notes (Signed)
Physical Therapy Treatment Patient Details Name: [name] [name] MRN: [id number] DOB: [birth year] Today's Date: 04/01/2013 Time: 2130-8657 PT Time Calculation (min): 24 min  PT Assessment / Plan / Recommendation  History of Present Illness Pt readmitted after recent T9-L3 fusion due to T12 burst fx.  Pt with failure of hardware and wound infection and underwent I&D and hardware removal on 03/30/13.8/14 with tachycardia and hypoxia transferred to ICU   PT Comments   Pt with progression from eval but demonstrates confusion and decreased problem solving. Total assist to don brace EOB with brace rising into pt throat sitting, adjusted brace without airway compromise but still elevated and RN aware. Will continue to follow.   Follow Up Recommendations        Does the patient have the potential to tolerate intense rehabilitation     Barriers to Discharge        Equipment Recommendations       Recommendations for Other Services    Frequency     Progress towards PT Goals Progress towards PT goals: Progressing toward goals  Plan Current plan remains appropriate    Precautions / Restrictions Precautions Precautions: Back;Fall Precaution Comments: pt unable to recall precautions and educated for all Required Braces or Orthoses: Spinal Brace Spinal Brace: Thoracolumbosacral orthotic;Applied in sitting position   Pertinent Vitals/Pain 5-6/10 back pain, RN notified sats did not pick up accurately with mobility with drop to 88% at rest after walk on 2L back to 100% seated on 3L HR 91    Mobility  Bed Mobility Bed Mobility: Rolling Left;Left Sidelying to Sit;Sitting - Scoot to [place] of Bed Rolling Left: 4: Min assist;With rail Left Sidelying to Sit: 3: Mod assist;HOB flat Sitting - Scoot to Edge of Bed: 3: Mod assist Details for Bed Mobility Assistance: cueing for sequence, hand placement, safety and technique Transfers Transfers: Sit to Stand;Stand to Sit Sit to Stand: 3: Mod assist;From  bed Stand to Sit: 4: Min assist;To chair/3-in-1 Details for Transfer Assistance: cueing for hand placement, position at surface and safety Ambulation/Gait Ambulation/Gait Assistance: 4: Min assist (+1 for lines ) Ambulation Distance (Feet): 17 Feet Assistive device: Rolling walker Ambulation/Gait Assistance Details: cueing for posture, position in RW and assist to change direction Gait Pattern: Step-through pattern;Decreased stride length Gait velocity: decreased Stairs: No    Exercises     PT Diagnosis:    PT Problem List:   PT Treatment Interventions:     PT Goals (current goals can now be found in the care plan section)    Visit Information  Last PT Received On: 04/01/13 Assistance Needed: +2 (safety and lines) History of Present Illness: Pt readmitted after recent T9-L3 fusion due to T12 burst fx.  Pt with failure of hardware and wound infection and underwent I&D and hardware removal on 03/30/13.8/14 with tachycardia and hypoxia transferred to ICU    Subjective Data      Cognition  Cognition Arousal/Alertness: Awake/alert Behavior During Therapy: Flat affect Overall Cognitive Status: Impaired/Different from baseline Following Commands: Follows one step commands consistently Safety/Judgement: Decreased awareness of safety Problem Solving: Slow processing;Difficulty sequencing General Comments: Pt confused today with difficulty recalling where he is but oriented to date, end of session repeatedly stating "I love my dgtr,I love my wife"    Balance     End of Session PT - End of Session Equipment Utilized During Treatment: Back brace Activity Tolerance: Patient tolerated treatment well Patient left: in chair;with call bell/phone within reach Nurse Communication: Mobility status   GP  [name] 04/01/2013, 9:18 AM Delaney Meigs, PT [phone]

## 2013-04-01 NOTE — Clinical Documentation Improvement (Signed)
THIS DOCUMENT IS NOT A PERMANENT PART OF THE MEDICAL RECORD  Please update your documentation with the medical record to reflect your response to this query. If you need help knowing how to do this please call [phone].  04/01/13   Dear Dr.Javohn Basey,  In a better effort to capture your patient's severity of illness, reflect appropriate length of stay and utilization of resources, a review of the patient medical record has revealed the following indicators.   Based on your clinical judgment, please clarify and document in a progress note and/or discharge summary the clinical condition associated with the following supporting information: In responding to this query please exercise your independent judgment.  The fact that a query is asked, does not imply that any particular answer is desired or expected.   The patient went to the OR on 8/13 and the following procedure was performed: HARDWARE REMOVAL/IRRIGATION & DEBRIDEMENT OF WOUND ON BACK (N/A)  Because there is documentation in the medical record of "debridement" clarification is needed. If possible, please help by addending the current operative note or adding a new note. Thank you!  Please document the below (4) key elements in a progress note:  1.  Type of Debridement:          Excisional Debridement-  Cutting away necrotic, devitalized tissue or slough to  Level of viable tissue using a sharp instrument (example, scalpel, scissors, etc).           NON Excisional Debridement-  The removal of necrotic, devitalized tissue or slough by means of scraping, mechanical brushing, flushing, or washing. (example: irrigation, whirlpool);  Minor removal of loose fragments  2.  Please indicate instrument used:  Scissors, Scalpel, Curette, or other  3.  Please document depth of the debridement:             Partial Thickness  Full Thickness  Skin and Subcutaneous Tissue  Subcutaneous Tissue and Muscle  Subcutaneous Tissue, Muscle and  Bone  Other  4.  Document the size of the debridement.   **You may use possible, probable, or suspect with inpatient documentation. possible, probable, suspected diagnoses MUST be documented at the time of discharge    Reviewed: additional documentation in the medical record  Thank You,  Saul Fordyce  Clinical Documentation Specialist: [phone] Health Information Management [place]  Completed the deficiency.  Progress note detailing missing information.

## 2013-04-01 NOTE — Progress Notes (Signed)
NUTRITION FOLLOW UP  INTERVENTION:  Diet advancement per SLP recommendations When diet advanced, recommend Ensure Complete po BID, each supplement provides 350 kcal and 13 grams of protein.  NUTRITION DIAGNOSIS:  Increased nutrient needs related to wounds with VAC in place as evidenced by estimated nutrition needs; ongoing.   Goal:  Intake to meet >90% of estimated nutrition needs to promote wound healing, not yet met.   Monitor:  Diet advancement, PO intake, labs, weight trend.  Assessment:   Recent admission for T12 burst fx. 13 days out from initial surgery. S/P I&D and removal of hardware 8/13. Patient with a large incision on back from spinal surgery. Also with a stage 2 pressure ulcer to left inner buttock; WOC RN has seen patient.  Pt discussed during ICU rounds and with RN.  Per RN pt coughing with water this am; pt to have swallow evaluation.   Height: Ht Readings from Last 1 Encounters:  03/30/13 5\' 8"  (1.727 m)    Weight Status:   Wt Readings from Last 1 Encounters:  03/30/13 191 lb 2.2 oz (86.7 kg)  Admission weight 191 lb 8/12 (86.7 kg)  Re-estimated needs:  Kcal: 2000-2200  Protein: 115-130 gm  Fluid: 2.1-2.3 L  Skin: stage 2 pressure ulcer on buttocks; VAC to back incision   Diet Order:   NPO   Intake/Output Summary (Last 24 hours) at 04/01/13 1157 Last data filed at 04/01/13 0900  Gross per 24 hour  Intake   1600 ml  Output    350 ml  Net   1250 ml    Last BM: 8/12   Labs:   Recent Labs Lab 03/31/13 1758 03/31/13 2200 04/01/13 1100  NA 136 136 138  K 3.6 3.5 3.5  CL 100 102 103  CO2 17* 20 20  BUN 23 29* 34*  CREATININE 0.87 1.12 1.07  CALCIUM 8.5 8.7 8.9  GLUCOSE 72 71 103*    CBG (last 3)   Recent Labs  04/01/13 0029 04/01/13 0413 04/01/13 0744  GLUCAP 104* 121* 127*    Scheduled Meds: . antiseptic oral rinse  15 mL Mouth Rinse BID  . ceFEPime (MAXIPIME) IV  2 g Intravenous Q8H  . docusate sodium  100 mg Oral BID  .  insulin aspart  0-15 Units Subcutaneous Q4H  . sodium chloride  3 mL Intravenous Q12H    Continuous Infusions: . sodium chloride    . lactated ringers 85 mL/hr at 03/31/13 [address], LDN, CNSC [phone] Pager [phone] After Hours Pager

## 2013-04-01 NOTE — Progress Notes (Signed)
Pt transferred to the unit. Pt is stable, alert and oriented per baseline. Oriented to room, staff, and call bell. Educated to call for any assistance. Bed in lowest position, call bell within reach- will continue to monitor. 

## 2013-04-01 NOTE — Progress Notes (Signed)
Chaplain Note:  Chaplain visited with pt who was sitting up in a recliner next to the bed.  The pt was awake and alert.  He spoke as if he was processing thoughts and feelings that would normally be in the mental background, not shared aloud.  They were not at all imaginary, simply memories and feelings weighing on his spirit.  Chaplain provided active listening, spiritual comfort, support, and prayer as the pt expressed himself.  The pt expressed appreciation of chaplain support, especially prayer.  Chaplain will follow up as needed.  04/01/13 1000  Clinical Encounter Type  Visited With Patient  Visit Type Spiritual support  Referral From Nurse  Spiritual Encounters  Spiritual Needs Prayer;Emotional  Stress Factors  Patient Stress Factors Health changes;Major life changes  Family Stress Factors Not reviewed (No family present)  Verdie Shire, Chaplain [phone]

## 2013-04-01 NOTE — Progress Notes (Signed)
Wound: C/D/I  no necrosis or erythema Vac changed

## 2013-04-01 NOTE — Progress Notes (Signed)
    Subjective: Procedure(s) (LRB): HARDWARE REMOVAL/IRRIGATION & DEBRIDEMENT OF WOUND ON BACK (N/A) 2 Days Post-Op  Patient reports pain as 5 on 0-10 scale.  Reports decreased leg pain reports incisional back pain   N/A void - foley Negative bowel movement Positive flatus Negative chest pain or shortness of breath  Objective: Vital signs in last 24 hours: Temp:  [97.5 F (36.4 C)-99.5 F (37.5 C)] 97.5 F (36.4 C) (08/15 0745) Pulse Rate:  [86-114] 87 (08/15 0700) Resp:  [13-30] 13 (08/15 0700) BP: (86-150)/(43-95) 113/46 mmHg (08/15 0700) SpO2:  [90 %-100 %] 99 % (08/15 0700) FiO2 (%):  [100 %] 100 % (08/14 1730)  Intake/Output from previous day: 08/14 0701 - 08/15 0700 In: 1480 [P.O.:150; I.V.:680; IV Piggyback:100] Out: 475 [Urine:475]  Labs:  Recent Labs  03/31/13 1758 03/31/13 2200  WBC 4.0 9.4  RBC 2.36* 2.42*  HCT 21.1* 21.5*  PLT 30* 41*    Recent Labs  03/31/13 1758 03/31/13 2200  NA 136 136  K 3.6 3.5  CL 100 102  CO2 17* 20  BUN 23 29*  CREATININE 0.87 1.12  GLUCOSE 72 71  CALCIUM 8.5 8.7    Recent Labs  03/29/13 1418 03/31/13 0500  INR 1.51* 1.57*    Physical Exam: Neurologically intact ABD soft Incision: dressing C/D/I and no drainage Compartment soft  Assessment/Plan: Patient stable  xrays N/A Continue mobilization with physical therapy Continue care  Events of PM noted Patient in MICU  Awake and alert Will change dressing today Continue close monitoring Continue Abx   Venita Lick, MD [facility] [phone]

## 2013-04-01 NOTE — Progress Notes (Signed)
[facility]  Day # 3 vancomycin Day # 2 cefepime  Subjective: Felt sob, weak   Antibiotics:  Anti-infectives   Start     Dose/Rate Route Frequency Ordered Stop   03/31/13 2200  ceFEPIme (MAXIPIME) 2 g in dextrose 5 % 50 mL IVPB     2 g 100 mL/hr over 30 Minutes Intravenous 3 times per day 03/31/13 1537     03/31/13 1000  vancomycin (VANCOCIN) IVPB 1000 mg/200 mL premix  Status:  Discontinued     1,000 mg 200 mL/hr over 60 Minutes Intravenous Every 12 hours 03/30/13 2144 04/01/13 0938   03/30/13 2300  cefTAZidime (FORTAZ) 2 g in dextrose 5 % 50 mL IVPB  Status:  Discontinued     2 g 100 mL/hr over 30 Minutes Intravenous 3 times per day 03/30/13 2132 03/31/13 1513   03/30/13 2200  ceFAZolin (ANCEF) IVPB 1 g/50 mL premix  Status:  Discontinued     1 g 100 mL/hr over 30 Minutes Intravenous Every 8 hours 03/30/13 1723 03/30/13 2112   03/30/13 2100  vancomycin (VANCOCIN) IVPB 1000 mg/200 mL premix  Status:  Discontinued     1,000 mg 200 mL/hr over 60 Minutes Intravenous Every 12 hours 03/30/13 1723 03/30/13 2144   03/30/13 1641  vancomycin (VANCOCIN) powder  Status:  Discontinued       As needed 03/30/13 1649 03/30/13 1703   03/29/13 1430  ceFAZolin (ANCEF) IVPB 1 g/50 mL premix     1 g 100 mL/hr over 30 Minutes Intravenous  Once 03/29/13 1417 03/29/13 1606      Medications: Scheduled Meds: . antiseptic oral rinse  15 mL Mouth Rinse BID  . ceFEPime (MAXIPIME) IV  2 g Intravenous Q8H  . docusate sodium  100 mg Oral BID  . insulin aspart  0-15 Units Subcutaneous Q4H  . sodium chloride  3 mL Intravenous Q12H   Continuous Infusions: . sodium chloride    . lactated ringers 85 mL/hr at 03/31/13 2300   PRN Meds:.menthol-cetylpyridinium, methocarbamol (ROBAXIN) IV, methocarbamol, morphine injection, ondansetron (ZOFRAN) IV, oxyCODONE, phenol, sodium chloride, zolpidem   Objective: Weight change:   Intake/Output Summary (Last 24 hours) at 04/01/13  1119 Last data filed at 04/01/13 0900  Gross per 24 hour  Intake   1600 ml  Output    350 ml  Net   1250 ml   Blood pressure 116/86, pulse 87, temperature 97.5 F (36.4 C), temperature source Oral, resp. rate 21, height 5\' 8"  (1.727 m), weight 191 lb 2.2 oz (86.7 kg), SpO2 100.00%. Temp:  [97.5 F (36.4 C)-99.5 F (37.5 C)] 97.5 F (36.4 C) (08/15 0745) Pulse Rate:  [85-114] 87 (08/15 0940) Resp:  [13-30] 21 (08/15 0940) BP: (86-150)/(43-95) 116/86 mmHg (08/15 0940) SpO2:  [90 %-100 %] 100 % (08/15 0940) FiO2 (%):  [100 %] 100 % (08/15 0800)  Physical Exam: General: Alert and awake, oriented x2 not in any acute distress HEENT: anicteric sclera, , EOMI, oropharynx clear and without exudate  CVS regular rate, normal r, no murmur rubs or gallops  Chest: few crackles, wearing brace Abdomen: soft nontender, nondistended, normal bowel sounds,  Skin: no rashes dressing in place  Neuro: nonfocal,    Lab Results:  Recent Labs  03/31/13 1758 03/31/13 2200  WBC 4.0 9.4  HGB 7.3* 7.6*  HCT 21.1* 21.5*  PLT 30* 41*    BMET  Recent Labs  03/31/13 1758 03/31/13 2200  NA 136 136  K 3.6  3.5  CL 100 102  CO2 17* 20  GLUCOSE 72 71  BUN 23 29*  CREATININE 0.87 1.12  CALCIUM 8.5 8.7    Micro Results: Recent Results (from the past 240 hour(s))  ANAEROBIC CULTURE     Status: None   Collection Time    03/30/13  3:50 PM      Result Value Range Status   Specimen Description WOUND BACK   Final   Special Requests NONE   Final   Gram Stain     Final   Value: MODERATE WBC PRESENT,BOTH PMN AND MONONUCLEAR     NO SQUAMOUS EPITHELIAL CELLS SEEN     NO ORGANISMS SEEN     Performed at [facility]     Performed at [facility]   Culture     Final   Value: NO ANAEROBES ISOLATED; CULTURE IN PROGRESS FOR 5 DAYS     Performed at [facility]   Report Status PENDING   Incomplete  WOUND CULTURE     Status: None   Collection Time    03/30/13  3:50 PM       Result Value Range Status   Specimen Description WOUND BACK   Final   Special Requests NONE   Final   Gram Stain     Final   Value: MODERATE WBC PRESENT,BOTH PMN AND MONONUCLEAR     NO SQUAMOUS EPITHELIAL CELLS SEEN     NO ORGANISMS SEEN     Performed at [facility]     Performed at [facility]   Culture     Final   Value: FEW GRAM NEGATIVE RODS     Performed at [facility]   Report Status PENDING   Incomplete  GRAM STAIN     Status: None   Collection Time    03/30/13  3:50 PM      Result Value Range Status   Specimen Description WOUND BACK   Final   Special Requests NONE   Final   Gram Stain     Final   Value: MODERATE WBC PRESENT,BOTH PMN AND MONONUCLEAR     NO ORGANISMS SEEN   Report Status 03/30/2013 FINAL   Final  MRSA PCR SCREENING     Status: None   Collection Time    03/30/13 10:34 PM      Result Value Range Status   MRSA by PCR NEGATIVE  NEGATIVE Final   Comment:            The GeneXpert MRSA Assay (FDA     approved for NASAL specimens     only), is one component of a     comprehensive MRSA colonization     surveillance program. It is not     intended to diagnose MRSA     infection nor to guide or     monitor treatment for     MRSA infections.    Studies/Results: Ct Angio Chest Pe W/cm &/or Wo Cm  04/01/2013   *RADIOLOGY REPORT*  Clinical Data: Rule out pulmonary embolism.  CT ANGIOGRAPHY CHEST  Technique:  Multidetector CT imaging of the chest using the standard protocol during bolus administration of intravenous contrast. Multiplanar reconstructed images including MIPs were obtained and reviewed to evaluate the vascular anatomy.  Contrast: OMNIPAQUE IOHEXOL 350 MG/ML SOLN  Comparison: Chest CT from 2 days prior.  Findings:  THORACIC INLET/BODY WALL:  Gynecomastia.  MEDIASTINUM:  Normal heart size.  No pericardial effusion.  Diffuse aortic and coronary atherosclerosis.  Status post CABG, including the LIMA and a saphenous graft.   Proximally, these grafts opacify as expected.  No pulmonary embolism is seen, although rapid respiratory rate and motion artifact exclude evaluation beyond the level of the proximal segmental pulmonary arteries or lobar pulmonary arteries, depending on the level of imaging.  No adenopathy. Small to moderate sliding type hiatal hernia.  LUNG WINDOWS:  There are small patchy airspace opacities in the upper lobes, along the major fissure on the left and anteriorly on the right. Although the right side opacity is peripheral and somewhat wedge- shaped, infarct considered unlikely given upper lobe region. Dependent atelectasis.  Small pleural effusions, left greater than right.  UPPER ABDOMEN:  No acute findings.  OSSEOUS:  Recent removal of spinal fixation hardware.  There is gas and bone graft in the posterior paraspinal region.  IMPRESSION:  1.  No central pulmonary embolism.  Rapid respiratory rate limits evaluation of the more distal pulmonary arterial tree (beyond the lobar or proximal segmental levels). 2.  Small patchy upper lobe opacities, which may represent early infection. 3.  Dependent atelectasis and small pleural effusions.   Original Report Authenticated By: Tiburcio Pea   Dg Chest Port 1 View  03/31/2013   *RADIOLOGY REPORT*  Clinical Data: Respiratory distress.  PORTABLE CHEST - 1 VIEW  Comparison: 03/08/2013.  Findings: The cardiac silhouette, mediastinal and hilar contours are stable.  There is tortuosity and calcification of the thoracic aorta.  A hiatal hernia is again noted.  Slightly low lung volumes with vascular crowding and bibasilar atelectasis.  No edema or effusions.  IMPRESSION: Low lung volumes with vascular crowding and bibasilar atelectasis.   Original Report Authenticated By: Rudie Meyer, M.D.      Assessment/Plan: Elia Keenum is a 79 y.o. male  with hardware associated T-Lumbar infected hardware sp removal   #1 Hardware associated T-L osteomyelitis: he is growing GNR  on culture --narrow to cefepime  --DOES HE have hardware present?, if so he will need protracted oral abx following his 6-8 week course of IV abx   #2 Screening: check HIV and hepatitis C  Dr. Luciana Axe is available this weekend for questions.    LOS: 3 days   Acey Lav 04/01/2013, 11:19 AM

## 2013-04-01 NOTE — Progress Notes (Addendum)
Clinical Social Work Department CLINICAL SOCIAL WORK PLACEMENT NOTE 04/01/2013  Patient:  [name] [name], [name] [name]  Account Number:  [id number] Admit date:  03/29/2013  Clinical Social Worker:  Salomon Fick, LCSW  Date/time:  04/01/2013 03:30 PM  Clinical Social Work is seeking post-discharge placement for this patient at the following level of care:   SKILLED NURSING   (*CSW will update this form in Epic as items are completed)   04/01/2013  Patient/family provided with [facility] Department of Clinical Social Work's list of facilities offering this level of care within the geographic area requested by the patient (or if unable, by the patient's family).  04/01/2013  Patient/family informed of their freedom to choose among providers that offer the needed level of care, that participate in Medicare, Medicaid or managed care program needed by the patient, have an available bed and are willing to accept the patient.  04/01/2013  Patient/family informed of [facility]' ownership interest in [facility], as well as of the fact that they are under no obligation to receive care at this facility.  PASARR submitted to EDS on 04/01/2013 PASARR number received from EDS on 04/01/13  FL2 transmitted to all facilities in geographic area requested by pt/family on  04/01/2013 FL2 transmitted to all facilities within larger geographic area on 04/01/2013  Patient informed that his/her managed care company has contracts with or will negotiate with  certain facilities, including the following:     Patient/family informed of bed offers received:  04/07/13 Patient chooses bed at [facility] Physician recommends and patient chooses bed at    Patient to be transferred to [facility] on  04/07/13 Patient to be transferred to facility by [facility]  The following physician request were entered in Epic:   Additional Comments:

## 2013-04-01 NOTE — Clinical Social Work Placement (Signed)
Clinical Social Work Department CLINICAL SOCIAL WORK PLACEMENT NOTE 04/01/2013  Patient:  [name] [name], [name] [name]  Account Number:  [id number] Admit date:  03/29/2013  Clinical Social Worker:  Salomon Fick, LCSW  Date/time:  04/01/2013 03:30 PM  Clinical Social Work is seeking post-discharge placement for this patient at the following level of care:   SKILLED NURSING   (*CSW will update this form in Epic as items are completed)   04/01/2013  Patient/family provided with [facility] Department of Clinical Social Work's list of facilities offering this level of care within the geographic area requested by the patient (or if unable, by the patient's family).  04/01/2013  Patient/family informed of their freedom to choose among providers that offer the needed level of care, that participate in Medicare, Medicaid or managed care program needed by the patient, have an available bed and are willing to accept the patient.  04/01/2013  Patient/family informed of [facility]' ownership interest in [facility], as well as of the fact that they are under no obligation to receive care at this facility.  PASARR submitted to EDS on 04/01/2013 PASARR number received from EDS on   FL2 transmitted to all facilities in geographic area requested by pt/family on  04/01/2013 FL2 transmitted to all facilities within larger geographic area on 04/01/2013  Patient informed that his/her managed care company has contracts with or will negotiate with  certain facilities, including the following:     Patient/family informed of bed offers received:   Patient chooses bed at  Physician recommends and patient chooses bed at    Patient to be transferred to  on   Patient to be transferred to facility by   The following physician request were entered in Epic:   Additional Comments:

## 2013-04-01 NOTE — Evaluation (Signed)
Clinical/Bedside Swallow Evaluation Patient Details  Name: [name] [name] MRN: [id number] Date of Birth: 1933-11-19  Today's Date: 04/01/2013 Time: 4401-0272 SLP Time Calculation (min): 37 min  Past Medical History:  Past Medical History  Diagnosis Date  . Hypertension   . Diabetes mellitus without complication   . Chronic kidney disease     bph  . Arthritis   . Coronary artery disease   . Myocardial infarction    Past Surgical History:  Past Surgical History  Procedure Laterality Date  . Appendectomy    . Coronary artery bypass graft  11/09/2000    LIMA to LAD, SVG to RPDA, seq SVG to OM2 and D1 by [name] [facility]  . Lumbar fusion  03/15/2013    Dr Shon Baton  . Posterior lumbar fusion 4 level N/A 03/16/2013    Procedure: T9 - L3 POSTERIOR POSTERIOR SPINAL FUSION ;  Surgeon: Venita Lick, MD;  Location: [facility] OR;  Service: Orthopedics;  Laterality: N/A;  . Hardware removal N/A 03/30/2013    Procedure: HARDWARE REMOVAL/IRRIGATION & DEBRIDEMENT OF WOUND ON BACK;  Surgeon: Venita Lick, MD;  Location: [facility] OR;  Service: Orthopedics;  Laterality: N/A;   HPI:  79 year old male seen 03/16/13 for T9-L3 PSF.  Returned to [facility] 03/29/13 due to increased drainage and pain.  Pt underwent hardware removal and wound debridement 03/31/13.  He was briefly intubated, during surgery only.  BSE ordered due to difficulty swallowing this am per RN.   Assessment / Plan / Recommendation Clinical Impression  Pt seated as upright as he would tolerate.  Pillow folded to further insure upright position.  Pt cooperative with oral care using suction.  Upper and lower dentures placed in pt mouth following oral care.  Oral motor strength and function appears to be [facility].  No clinical indications of aspiration during this assesment.  Pt did have difficulty with self feed, so will  need assist at meals.  Pt also reported tolerating soft foods prior to admit.  Intermittent belching exhibited during assessment,  raising suspicion for esophageal issues.  Will begin Dys 2 diet, thin liquids and post precautions for safe swallow and reflux.  ST to follow for diet tolerance and appropriateness to advance.  RN informed.    Aspiration Risk  Mild    Diet Recommendation Dysphagia 2 (Fine chop);Thin liquid   Liquid Administration via: Straw Medication Administration: Whole meds with liquid (1 at a time) Supervision: Staff feed patient Compensations: Slow rate;Small sips/bites Postural Changes and/or Swallow Maneuvers: Seated upright 90 degrees;Upright 30-60 min after meal    Other  Recommendations Oral Care Recommendations: Oral care before and after PO;Staff/trained caregiver to provide oral care Other Recommendations: Have oral suction available   Follow Up Recommendations  24 hour supervision/assistance    Frequency and Duration min 1 x/week  1 week   Pertinent Vitals/Pain No pain indicated    SLP Swallow Goals Patient will consume recommended diet without observed clinical signs of aspiration with: Total assistance Swallow Study Goal #1 - Progress: Progressing toward goal Patient will utilize recommended strategies during swallow to increase swallowing safety with: Total assistance Swallow Study Goal #2 - Progress: Progressing toward goal   Swallow Study Prior Functional Status   Pt reports tolerating soft foods/ thin liquids prior to admit    General Date of Onset: 03/29/13 HPI: 79 year old male seen 03/16/13 for T9-L3 PSF.  Returned to [facility] 03/29/13 due to increased drainage and pain.  Pt underwent hardware removal and wound debridement  03/31/13.  He was briefly intubated, during surgery only.  BSE ordered due to difficulty swallowing this am per RN. Type of Study: Bedside swallow evaluation Diet Prior to this Study: NPO Temperature Spikes Noted: No Respiratory Status: Supplemental O2 delivered via (comment) ([place]@1L ) History of Recent Intubation: Yes Length of Intubations (days): 1  days Date extubated: 03/31/13 Behavior/Cognition: Alert;Cooperative;Confused;Pleasant mood;Hard of hearing;Requires cueing;Distractible;Decreased sustained attention Oral Cavity - Dentition: Dentures, top;Dentures, bottom Self-Feeding Abilities: Total assist Patient Positioning: Upright in bed Baseline Vocal Quality: Clear Volitional Cough: Cognitively unable to elicit Volitional Swallow: Unable to elicit    Oral/Motor/Sensory Function Overall Oral Motor/Sensory Function: Appears within functional limits for tasks assessed   Ice Chips Ice chips: Within functional limits Presentation: Spoon   Thin Liquid Thin Liquid: Within functional limits Presentation: Cup;Straw    Nectar Thick Nectar Thick Liquid: Not tested   Honey Thick Honey Thick Liquid: Not tested   Puree Puree: Within functional limits Presentation: Spoon Other Comments: Pt had difficulty with self-feeding.   Solid   GO   Celia B. Murvin [facility], CCC-SLP [phone] Solid: Impaired Oral Phase Impairments: Other (comment) (pt reports cracker is "too hard") Other Comments: Pt reports tolerating soft diet prior to admit.       Leigh Aurora 04/01/2013,1:49 PM

## 2013-04-01 NOTE — Progress Notes (Signed)
Was informed by Lab that AM collection has not been completed as two phlebotomy employees have been unable to obtain labs. She reported that another person would come and attempt to obtain them.   Jacqulyn Cane RN, BSN, CCRN

## 2013-04-02 ENCOUNTER — Inpatient Hospital Stay (HOSPITAL_COMMUNITY): Payer: Medicare Other

## 2013-04-02 LAB — URINE CULTURE: Culture: NO GROWTH

## 2013-04-02 LAB — CBC WITH DIFFERENTIAL/PLATELET
Basophils Absolute: 0 10*3/uL (ref 0.0–0.1)
Eosinophils Relative: 0 % (ref 0–5)
Lymphs Abs: 0.5 10*3/uL — ABNORMAL LOW (ref 0.7–4.0)
MCH: 28.5 pg (ref 26.0–34.0)
MCV: 85.5 fL (ref 78.0–100.0)
Monocytes Absolute: 0.4 10*3/uL (ref 0.1–1.0)
Monocytes Relative: 4 % (ref 3–12)
Neutrophils Relative %: 91 % — ABNORMAL HIGH (ref 43–77)
Platelets: 46 10*3/uL — ABNORMAL LOW (ref 150–400)
RBC: 2.49 MIL/uL — ABNORMAL LOW (ref 4.22–5.81)
RDW: 15.3 % (ref 11.5–15.5)
WBC: 9.9 10*3/uL (ref 4.0–10.5)

## 2013-04-02 LAB — COMPREHENSIVE METABOLIC PANEL
Alkaline Phosphatase: 59 U/L (ref 39–117)
BUN: 38 mg/dL — ABNORMAL HIGH (ref 6–23)
CO2: 21 mEq/L (ref 19–32)
Chloride: 106 mEq/L (ref 96–112)
Creatinine, Ser: 0.92 mg/dL (ref 0.50–1.35)
GFR calc non Af Amer: 80 mL/min — ABNORMAL LOW (ref >90)
Potassium: 3.8 mEq/L (ref 3.5–5.1)
Total Bilirubin: 2.4 mg/dL — ABNORMAL HIGH (ref 0.3–1.2)

## 2013-04-02 LAB — WOUND CULTURE

## 2013-04-02 LAB — GLUCOSE, CAPILLARY
Glucose-Capillary: 132 mg/dL — ABNORMAL HIGH (ref 70–99)
Glucose-Capillary: 178 mg/dL — ABNORMAL HIGH (ref 70–99)

## 2013-04-02 MED ORDER — DEXTROSE 5 % IV SOLN
2.0000 g | INTRAVENOUS | Status: DC
  Administered 2013-04-02 – 2013-04-07 (×6): 2 g via INTRAVENOUS
  Filled 2013-04-02 (×6): qty 2

## 2013-04-02 NOTE — Progress Notes (Signed)
Patient ID: [name] [name], male   DOB: [birth year], 79 y.o.   MRN: [id number] Subjective: 3 Days Post-Op Procedure(s) (LRB): HARDWARE REMOVAL/IRRIGATION & DEBRIDEMENT OF WOUND ON BACK (N/A)    Patient reports pain as moderate.  In bed this am while trying to get blood for CBC and chemistry Difficult to understand, mumbles versus some confusion  Objective:   VITALS:   Filed Vitals:   04/02/13 0605  BP: 112/61  Pulse: 90  Temp: 98 F (36.7 C)  Resp: 24    Neurovascular intact BLE Incision: dressing C/D/I  LABS  Recent Labs  03/31/13 1758 03/31/13 2200 04/01/13 1100  HGB 7.3* 7.6* 7.4*  HCT 21.1* 21.5* 21.0*  WBC 4.0 9.4 12.6*  PLT 30* 41* 58*     Recent Labs  03/31/13 1758 03/31/13 2200 04/01/13 1100  NA 136 136 138  K 3.6 3.5 3.5  BUN 23 29* 34*  CREATININE 0.87 1.12 1.07  GLUCOSE 72 71 103*     Recent Labs  03/31/13 0500  INR 1.57*     Assessment/Plan: 3 Days Post-Op Procedure(s) (LRB): HARDWARE REMOVAL/IRRIGATION & DEBRIDEMENT OF WOUND ON BACK (N/A)   Up with therapy Spine plan per Dr. Shon Baton

## 2013-04-02 NOTE — Progress Notes (Signed)
Weekend CSW left bed offers with patient and contacted patient's daughter, Alona Bene, to inform her of bed offers. Daughter stated that she feels overwhelmed in caring for her parents, CSW provided emotional support. CSW assured daughter that CSW is here to facilitate transition to SNF. Daughter states that she would prefer [facility], CSW waiting for facility to make decision. Weekend CSW informed patient that weekday CSW would follow up with her with [facility] decision. Weekday CSW to follow for discharge planning.  Samuella Bruin, LCSWA [facility] [phone]
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
## 2013-04-02 NOTE — Progress Notes (Signed)
[facility] PROGRESS NOTE [name] [name] ZOX:[id number] DOB: Jun 17, 1934 DOA: 03/29/2013 PCP: Provider Not In System  Assessment/Plan: Hardware-Associated T-L Osteomyelitis -Cx with Serratia. -Continue ceftriaxone. -Per ID will need 6-8 weeks of IV antibiotics, followed possibly by a protracted course of oral antibiotics.  Acute Hypoxemic Respiratory Failure -Resolved. -No oxygen requirements currently. -CT angio without PE.  HTN -BP well controlled off of home medications.  Sepsis -2/2 spinal infection.  ARF -Resolved at present.  DM -Good control. -No changes to regimen.  Thrombocytopenia -Likely related to sepsis picture. -No signs of active bleeding.  Post-op Anemia -Transfusion if <7.0.   Code Status: Full Family Communication: Daughter Alona Bene, via telephone.  Disposition Plan: SNF   Antibiotics:  Rocephin   Subjective: Feels a little better today.  Objective: Filed Vitals:   04/01/13 1800 04/01/13 2004 04/02/13 0605 04/02/13 1336  BP:  112/59 112/61 114/62  Pulse: 92 99 90 81  Temp:  97.8 F (36.6 C) 98 F (36.7 C) 98.2 F (36.8 C)  TempSrc:  Oral Oral Oral  Resp: 32 34 24 22  Height:      Weight:  92.9 kg (204 lb 12.9 oz)    SpO2: 98% 95% 90% 94%    Intake/Output Summary (Last 24 hours) at 04/02/13 1549 Last data filed at 04/02/13 1337  Gross per 24 hour  Intake 1374.08 ml  Output    370 ml  Net 1004.08 ml   Filed Weights   03/30/13 1946 04/01/13 2004  Weight: 86.7 kg (191 lb 2.2 oz) 92.9 kg (204 lb 12.9 oz)    Exam:   General:  AA Ox3  Cardiovascular: RRR  Respiratory: CTA B  Abdomen: S/NT/+BS  Extremities: trace bilateral edema   Neurologic:  Non-focal  Data Reviewed: Basic Metabolic Panel:  Recent Labs Lab 03/31/13 0500 03/31/13 1758 03/31/13 2200 04/01/13 1100 04/02/13 0755  NA 137 136 136 138 138  K 3.4* 3.6 3.5 3.5 3.8  CL 103 100 102 103 106  CO2 22 17* 20 20 21   GLUCOSE 99 72 71 103*  128*  BUN 22 23 29* 34* 38*  CREATININE 0.85 0.87 1.12 1.07 0.92  CALCIUM 8.7 8.5 8.7 8.9 8.7   Liver Function Tests:  Recent Labs Lab 04/01/13 1100 04/02/13 0755  AST 27 25  ALT 13 12  ALKPHOS 62 59  BILITOT 2.7* 2.4*  PROT 4.4* 4.1*  ALBUMIN 1.5* 1.4*   No results found for this basename: LIPASE, AMYLASE,  in the last 168 hours No results found for this basename: AMMONIA,  in the last 168 hours CBC:  Recent Labs Lab 03/31/13 0500 03/31/13 1758 03/31/13 2200 04/01/13 1100 04/02/13 0755  WBC 7.5 4.0 9.4 12.6* 9.9  NEUTROABS  --   --   --  11.8* 9.0*  HGB 7.9* 7.3* 7.6* 7.4* 7.1*  HCT 21.9* 21.1* 21.5* 21.0* 21.3*  MCV 88.0 89.4 88.8 86.1 85.5  PLT 63* 30* 41* 58* 46*   Cardiac Enzymes:  Recent Labs Lab 03/31/13 1758 04/01/13 0030 04/01/13 1100  TROPONINI <0.30 <0.30 <0.30   BNP (last 3 results) No results found for this basename: PROBNP,  in the last 8760 hours CBG:  Recent Labs Lab 04/01/13 1211 04/01/13 1623 04/01/13 2208 04/02/13 0809 04/02/13 1229  GLUCAP 101* 146* 112* 132* 137*    Recent Results (from the past 240 hour(s))  ANAEROBIC CULTURE     Status: None   Collection Time    03/30/13  3:50 PM  Result Value Range Status   Specimen Description WOUND BACK   Final   Special Requests NONE   Final   Gram Stain     Final   Value: MODERATE WBC PRESENT,BOTH PMN AND MONONUCLEAR     NO SQUAMOUS EPITHELIAL CELLS SEEN     NO ORGANISMS SEEN     Performed at [facility]     Performed at [facility]   Culture     Final   Value: NO ANAEROBES ISOLATED; CULTURE IN PROGRESS FOR 5 DAYS     Performed at [facility]   Report Status PENDING   Incomplete  WOUND CULTURE     Status: None   Collection Time    03/30/13  3:50 PM      Result Value Range Status   Specimen Description WOUND BACK   Final   Special Requests NONE   Final   Gram Stain     Final   Value: MODERATE WBC PRESENT,BOTH PMN AND MONONUCLEAR     NO SQUAMOUS  EPITHELIAL CELLS SEEN     NO ORGANISMS SEEN     Performed at [facility]     Performed at [facility]   Culture     Final   Value: FEW SERRATIA MARCESCENS     Performed at [facility]   Report Status 04/02/2013 FINAL   Final   Organism ID, Bacteria SERRATIA MARCESCENS   Final  GRAM STAIN     Status: None   Collection Time    03/30/13  3:50 PM      Result Value Range Status   Specimen Description WOUND BACK   Final   Special Requests NONE   Final   Gram Stain     Final   Value: MODERATE WBC PRESENT,BOTH PMN AND MONONUCLEAR     NO ORGANISMS SEEN   Report Status 03/30/2013 FINAL   Final  MRSA PCR SCREENING     Status: None   Collection Time    03/30/13 10:34 PM      Result Value Range Status   MRSA by PCR NEGATIVE  NEGATIVE Final   Comment:            The GeneXpert MRSA Assay (FDA     approved for NASAL specimens     only), is one component of a     comprehensive MRSA colonization     surveillance program. It is not     intended to diagnose MRSA     infection nor to guide or     monitor treatment for     MRSA infections.  URINE CULTURE     Status: None   Collection Time    03/31/13 11:17 PM      Result Value Range Status   Specimen Description URINE, CATHETERIZED   Final   Special Requests CX ADDED AT [id number] [id number]   Final   Culture  Setup Time     Final   Value: 04/01/2013 00:58     Performed at [facility]   Colony Count     Final   Value: NO GROWTH     Performed at [facility]   Culture     Final   Value: NO GROWTH     Performed at [facility]   Report Status 04/02/2013 FINAL   Final     Studies: Ct Angio Chest Pe W/cm &/or Wo Cm  04/01/2013   *RADIOLOGY REPORT*  Clinical Data: Rule out pulmonary embolism.  CT ANGIOGRAPHY CHEST  Technique:  Multidetector CT imaging of the chest using the standard protocol during bolus administration of intravenous contrast. Multiplanar reconstructed images including MIPs were  obtained and reviewed to evaluate the vascular anatomy.  Contrast: OMNIPAQUE IOHEXOL 350 MG/ML SOLN  Comparison: Chest CT from 2 days prior.  Findings:  THORACIC INLET/BODY WALL:  Gynecomastia.  MEDIASTINUM:  Normal heart size.  No pericardial effusion. Diffuse aortic and coronary atherosclerosis.  Status post CABG, including the LIMA and a saphenous graft.  Proximally, these grafts opacify as expected.  No pulmonary embolism is seen, although rapid respiratory rate and motion artifact exclude evaluation beyond the level of the proximal segmental pulmonary arteries or lobar pulmonary arteries, depending on the level of imaging.  No adenopathy. Small to moderate sliding type hiatal hernia.  LUNG WINDOWS:  There are small patchy airspace opacities in the upper lobes, along the major fissure on the left and anteriorly on the right. Although the right side opacity is peripheral and somewhat wedge- shaped, infarct considered unlikely given upper lobe region. Dependent atelectasis.  Small pleural effusions, left greater than right.  UPPER ABDOMEN:  No acute findings.  OSSEOUS:  Recent removal of spinal fixation hardware.  There is gas and bone graft in the posterior paraspinal region.  IMPRESSION:  1.  No central pulmonary embolism.  Rapid respiratory rate limits evaluation of the more distal pulmonary arterial tree (beyond the lobar or proximal segmental levels). 2.  Small patchy upper lobe opacities, which may represent early infection. 3.  Dependent atelectasis and small pleural effusions.   Original Report Authenticated By: Tiburcio Pea   Dg Chest Port 1 View  04/02/2013   *RADIOLOGY REPORT*  Clinical Data: Evaluate infiltrates.  PORTABLE CHEST - 1 VIEW  Comparison: 03/31/2013  Findings: Single view of the chest demonstrates increased densities in the right mid and lower lung regions.  Few subtle densities in the right upper lung region.  Again noted are prominent lung markings in the left lung that have  not significantly changed. Stable appearance of the heart and mediastinum.  Trachea is midline.  IMPRESSION: Increased densities in the right mid and lower lung regions. Findings could be associated with atelectasis but cannot exclude an infectious etiology.  Prominent lung markings on the left side have minimally changed.   Original Report Authenticated By: Richarda Overlie, M.D.   Dg Chest Port 1 View  03/31/2013   *RADIOLOGY REPORT*  Clinical Data: Respiratory distress.  PORTABLE CHEST - 1 VIEW  Comparison: 03/08/2013.  Findings: The cardiac silhouette, mediastinal and hilar contours are stable.  There is tortuosity and calcification of the thoracic aorta.  A hiatal hernia is again noted.  Slightly low lung volumes with vascular crowding and bibasilar atelectasis.  No edema or effusions.  IMPRESSION: Low lung volumes with vascular crowding and bibasilar atelectasis.   Original Report Authenticated By: Rudie Meyer, M.D.    Scheduled Meds: . antiseptic oral rinse  15 mL Mouth Rinse BID  . cefTRIAXone (ROCEPHIN)  IV  2 g Intravenous Q24H  . docusate sodium  100 mg Oral BID  . insulin aspart  0-5 Units Subcutaneous QHS  . insulin aspart  0-9 Units Subcutaneous TID WC  . insulin aspart  3 Units Subcutaneous TID WC   Continuous Infusions: . sodium chloride 85 mL/hr at 04/02/13 0235    Active Problems:   CAD (coronary artery disease)   DM2 (diabetes mellitus, type 2)   HTN (hypertension)  Osteomyelitis of spine   Protein-calorie malnutrition, severe   Acute respiratory failure    Time spent: 35 minutes.    Chaya Jan  [facility] Pager [phone]  If 7PM-7AM, please contact night-coverage at [email or web address], password [facility] 04/02/2013, 3:49 PM  LOS: 4 days

## 2013-04-03 LAB — TYPE AND SCREEN
Antibody Screen: NEGATIVE
Unit division: 0

## 2013-04-03 LAB — CBC
HCT: 20.1 % — ABNORMAL LOW (ref 39.0–52.0)
Hemoglobin: 7.1 g/dL — ABNORMAL LOW (ref 13.0–17.0)
MCH: 30.7 pg (ref 26.0–34.0)
MCHC: 35.3 g/dL (ref 30.0–36.0)
MCV: 87 fL (ref 78.0–100.0)
RDW: 15.8 % — ABNORMAL HIGH (ref 11.5–15.5)

## 2013-04-03 LAB — BASIC METABOLIC PANEL
BUN: 33 mg/dL — ABNORMAL HIGH (ref 6–23)
CO2: 21 mEq/L (ref 19–32)
Chloride: 107 mEq/L (ref 96–112)
Creatinine, Ser: 0.76 mg/dL (ref 0.50–1.35)
Glucose, Bld: 137 mg/dL — ABNORMAL HIGH (ref 70–99)
Potassium: 3.3 mEq/L — ABNORMAL LOW (ref 3.5–5.1)

## 2013-04-03 MED ORDER — BOOST / RESOURCE BREEZE PO LIQD
1.0000 | Freq: Three times a day (TID) | ORAL | Status: DC
  Administered 2013-04-03 – 2013-04-07 (×8): 1 via ORAL

## 2013-04-03 MED ORDER — POTASSIUM CHLORIDE CRYS ER 20 MEQ PO TBCR
40.0000 meq | EXTENDED_RELEASE_TABLET | Freq: Once | ORAL | Status: AC
  Administered 2013-04-03: 40 meq via ORAL
  Filled 2013-04-03: qty 2

## 2013-04-03 NOTE — Progress Notes (Signed)
   Subjective: 4 Days Post-Op Procedure(s) (LRB): HARDWARE REMOVAL/IRRIGATION & DEBRIDEMENT OF WOUND ON BACK (N/A)   Patient reports pain as mild, pain well controlled. No events throughout the night. States that he is happy with Dr. Shon Baton and that the back is feeling good.  Objective:   VITALS:   Filed Vitals:   04/03/13 0512  BP: 156/85  Pulse: 85  Temp: 97.7 F (36.5 C)  Resp: 18    Neurovascular intact BLE Incision: scant drainage No cellulitis present Compartment soft  LABS  Recent Labs  04/01/13 1100 04/02/13 0755 04/03/13 0535  HGB 7.4* 7.1* 7.1*  HCT 21.0* 21.3* 20.1*  WBC 12.6* 9.9 9.7  PLT 58* 46* 62*     Recent Labs  04/01/13 1100 04/02/13 0755 04/03/13 0535  NA 138 138 138  K 3.5 3.8 3.3*  BUN 34* 38* 33*  CREATININE 1.07 0.92 0.76  GLUCOSE 103* 128* 137*     Assessment/Plan: 4 Days Post-Op Procedure(s) (LRB): HARDWARE REMOVAL/IRRIGATION & DEBRIDEMENT OF WOUND ON BACK (N/A)  PT to eval and treat Wound vac in place and pulling well. No leaks are apparent. He is very difficult to turn, but states that he is doing well. Spine plan per Dr. Helene Kelp. [name] [name]   PAC  04/03/2013, 10:24 AM

## 2013-04-03 NOTE — Progress Notes (Signed)
Additional medical information requested concerning patient: Excisional debridement: required use of scalpel as well as 9L of pulse lavage Size of wound: spanned the entire original surgical wound T9-L3 Required removal of spinal hardware (pedicle screw-rod contruct) Additional dx includes malnutrition requiring nutritional consult Sacral decubitus (early stage) - requested wound care consult to assist in management ID consult requested to asist in ABX regimen - patient will also require PICC line for 6 wk IV Abx treatment

## 2013-04-03 NOTE — Progress Notes (Signed)
[facility] PROGRESS NOTE [name] [name] WUJ:[id number] DOB: Jul 23, 1934 DOA: 03/29/2013 PCP: Provider Not In System  Assessment/Plan: Hardware-Associated T-L Osteomyelitis -Cx with Serratia. -Continue ceftriaxone. -Per ID will need 6-8 weeks of IV antibiotics, followed possibly by a protracted course of oral antibiotics.  Acute Hypoxemic Respiratory Failure -Resolved. -No oxygen requirements currently. -CT angio without PE.  HTN -BP well controlled off of home medications.  Sepsis -2/2 spinal infection.  ARF -Resolved at present.  DM -Fair control. -No changes to regimen.  Thrombocytopenia -Likely related to sepsis picture. -No signs of active bleeding.  Post-op Anemia -Transfusion if <7.0.  Anasarca -DC IVF. -Encourage better nutrition. -Mobilize as soon as able.   Code Status: Full Family Communication: Patient only.  Disposition Plan: SNF   Antibiotics:  Rocephin   Subjective: Feels a little better today.  Objective: Filed Vitals:   04/02/13 0605 04/02/13 1336 04/02/13 2119 04/03/13 0512  BP: 112/61 114/62 138/64 156/85  Pulse: 90 81 87 85  Temp: 98 F (36.7 C) 98.2 F (36.8 C) 97.9 F (36.6 C) 97.7 F (36.5 C)  TempSrc: Oral Oral Oral Oral  Resp: 24 22 20 18   Height:      Weight:      SpO2: 90% 94% 94% 94%    Intake/Output Summary (Last 24 hours) at 04/03/13 1234 Last data filed at 04/03/13 1000  Gross per 24 hour  Intake 3717.67 ml  Output    770 ml  Net 2947.67 ml   Filed Weights   03/30/13 1946 04/01/13 2004  Weight: 86.7 kg (191 lb 2.2 oz) 92.9 kg (204 lb 12.9 oz)    Exam:   General:  AA Ox3  Cardiovascular: RRR  Respiratory: CTA B  Abdomen: S/NT/+BS  Extremities: trace bilateral edema   Neurologic:  Non-focal  Data Reviewed: Basic Metabolic Panel:  Recent Labs Lab 03/31/13 1758 03/31/13 2200 04/01/13 1100 04/02/13 0755 04/03/13 0535  NA 136 136 138 138 138  K 3.6 3.5 3.5 3.8 3.3*  CL  100 102 103 106 107  CO2 17* 20 20 21 21   GLUCOSE 72 71 103* 128* 137*  BUN 23 29* 34* 38* 33*  CREATININE 0.87 1.12 1.07 0.92 0.76  CALCIUM 8.5 8.7 8.9 8.7 8.9   Liver Function Tests:  Recent Labs Lab 04/01/13 1100 04/02/13 0755  AST 27 25  ALT 13 12  ALKPHOS 62 59  BILITOT 2.7* 2.4*  PROT 4.4* 4.1*  ALBUMIN 1.5* 1.4*   No results found for this basename: LIPASE, AMYLASE,  in the last 168 hours No results found for this basename: AMMONIA,  in the last 168 hours CBC:  Recent Labs Lab 03/31/13 1758 03/31/13 2200 04/01/13 1100 04/02/13 0755 04/03/13 0535  WBC 4.0 9.4 12.6* 9.9 9.7  NEUTROABS  --   --  11.8* 9.0*  --   HGB 7.3* 7.6* 7.4* 7.1* 7.1*  HCT 21.1* 21.5* 21.0* 21.3* 20.1*  MCV 89.4 88.8 86.1 85.5 87.0  PLT 30* 41* 58* 46* 62*   Cardiac Enzymes:  Recent Labs Lab 03/31/13 1758 04/01/13 0030 04/01/13 1100  TROPONINI <0.30 <0.30 <0.30   BNP (last 3 results) No results found for this basename: PROBNP,  in the last 8760 hours CBG:  Recent Labs Lab 04/02/13 1229 04/02/13 1806 04/02/13 2119 04/03/13 0745 04/03/13 1133  GLUCAP 137* 125* 178* 119* 173*    Recent Results (from the past 240 hour(s))  ANAEROBIC CULTURE     Status: None   Collection Time  03/30/13  3:50 PM      Result Value Range Status   Specimen Description WOUND BACK   Final   Special Requests NONE   Final   Gram Stain     Final   Value: MODERATE WBC PRESENT,BOTH PMN AND MONONUCLEAR     NO SQUAMOUS EPITHELIAL CELLS SEEN     NO ORGANISMS SEEN     Performed at [facility]     Performed at [facility]   Culture     Final   Value: NO ANAEROBES ISOLATED; CULTURE IN PROGRESS FOR 5 DAYS     Performed at [facility]   Report Status PENDING   Incomplete  WOUND CULTURE     Status: None   Collection Time    03/30/13  3:50 PM      Result Value Range Status   Specimen Description WOUND BACK   Final   Special Requests NONE   Final   Gram Stain     Final    Value: MODERATE WBC PRESENT,BOTH PMN AND MONONUCLEAR     NO SQUAMOUS EPITHELIAL CELLS SEEN     NO ORGANISMS SEEN     Performed at [facility]     Performed at [facility]   Culture     Final   Value: FEW SERRATIA MARCESCENS     Performed at [facility]   Report Status 04/02/2013 FINAL   Final   Organism ID, Bacteria SERRATIA MARCESCENS   Final  GRAM STAIN     Status: None   Collection Time    03/30/13  3:50 PM      Result Value Range Status   Specimen Description WOUND BACK   Final   Special Requests NONE   Final   Gram Stain     Final   Value: MODERATE WBC PRESENT,BOTH PMN AND MONONUCLEAR     NO ORGANISMS SEEN   Report Status 03/30/2013 FINAL   Final  MRSA PCR SCREENING     Status: None   Collection Time    03/30/13 10:34 PM      Result Value Range Status   MRSA by PCR NEGATIVE  NEGATIVE Final   Comment:            The GeneXpert MRSA Assay (FDA     approved for NASAL specimens     only), is one component of a     comprehensive MRSA colonization     surveillance program. It is not     intended to diagnose MRSA     infection nor to guide or     monitor treatment for     MRSA infections.  URINE CULTURE     Status: None   Collection Time    03/31/13 11:17 PM      Result Value Range Status   Specimen Description URINE, CATHETERIZED   Final   Special Requests CX ADDED AT [id number]   Final   Culture  Setup Time     Final   Value: 04/01/2013 00:58     Performed at [facility]   Colony Count     Final   Value: NO GROWTH     Performed at [facility]   Culture     Final   Value: NO GROWTH     Performed at [facility]   Report Status 04/02/2013 FINAL   Final     Studies: Dg Chest Port 1 [address]  04/02/2013   *RADIOLOGY REPORT*  Clinical Data: Evaluate infiltrates.  PORTABLE CHEST - 1 VIEW  Comparison: 03/31/2013  Findings: Single view of the chest demonstrates increased densities in the right mid and lower lung regions.   Few subtle densities in the right upper lung region.  Again noted are prominent lung markings in the left lung that have not significantly changed. Stable appearance of the heart and mediastinum.  Trachea is midline.  IMPRESSION: Increased densities in the right mid and lower lung regions. Findings could be associated with atelectasis but cannot exclude an infectious etiology.  Prominent lung markings on the left side have minimally changed.   Original Report Authenticated By: Richarda Overlie, M.D.    Scheduled Meds: . antiseptic oral rinse  15 mL Mouth Rinse BID  . cefTRIAXone (ROCEPHIN)  IV  2 g Intravenous Q24H  . docusate sodium  100 mg Oral BID  . insulin aspart  0-5 Units Subcutaneous QHS  . insulin aspart  0-9 Units Subcutaneous TID WC  . insulin aspart  3 Units Subcutaneous TID WC   Continuous Infusions: . sodium chloride 85 mL/hr at 04/03/13 1610    Active Problems:   CAD (coronary artery disease)   DM2 (diabetes mellitus, type 2)   HTN (hypertension)   Osteomyelitis of spine   Protein-calorie malnutrition, severe   Acute respiratory failure    Time spent: 35 minutes.    Chaya Jan  [facility] Pager [phone]  If 7PM-7AM, please contact night-coverage at [email or web address], password [facility] 04/03/2013, 12:34 PM  LOS: 5 days

## 2013-04-04 ENCOUNTER — Inpatient Hospital Stay (HOSPITAL_COMMUNITY): Payer: Medicare Other

## 2013-04-04 DIAGNOSIS — J9620 Acute and chronic respiratory failure, unspecified whether with hypoxia or hypercapnia: Secondary | ICD-10-CM

## 2013-04-04 DIAGNOSIS — R509 Fever, unspecified: Secondary | ICD-10-CM

## 2013-04-04 DIAGNOSIS — A4152 Sepsis due to Pseudomonas: Secondary | ICD-10-CM

## 2013-04-04 LAB — CBC
MCH: 31 pg (ref 26.0–34.0)
MCHC: 35 g/dL (ref 30.0–36.0)
MCV: 88.8 fL (ref 78.0–100.0)
Platelets: 68 10*3/uL — ABNORMAL LOW (ref 150–400)
RDW: 16 % — ABNORMAL HIGH (ref 11.5–15.5)

## 2013-04-04 LAB — BASIC METABOLIC PANEL
BUN: 25 mg/dL — ABNORMAL HIGH (ref 6–23)
CO2: 22 mEq/L (ref 19–32)
Calcium: 9.3 mg/dL (ref 8.4–10.5)
Creatinine, Ser: 0.84 mg/dL (ref 0.50–1.35)
Glucose, Bld: 127 mg/dL — ABNORMAL HIGH (ref 70–99)

## 2013-04-04 LAB — GLUCOSE, CAPILLARY
Glucose-Capillary: 129 mg/dL — ABNORMAL HIGH (ref 70–99)
Glucose-Capillary: 118 mg/dL — ABNORMAL HIGH (ref 70–99)

## 2013-04-04 LAB — ANAEROBIC CULTURE

## 2013-04-04 LAB — TROPONIN I: Troponin I: <0.3 ng/mL (ref <0.30)

## 2013-04-04 MED ORDER — FUROSEMIDE 10 MG/ML IJ SOLN
40.0000 mg | Freq: Once | INTRAMUSCULAR | Status: AC
  Administered 2013-04-04: 40 mg via INTRAVENOUS

## 2013-04-04 MED ORDER — POTASSIUM CHLORIDE CRYS ER 20 MEQ PO TBCR
20.0000 meq | EXTENDED_RELEASE_TABLET | Freq: Two times a day (BID) | ORAL | Status: DC
  Administered 2013-04-04: 20 meq via ORAL
  Filled 2013-04-04 (×4): qty 1

## 2013-04-04 MED ORDER — ACETAMINOPHEN 325 MG PO TABS
650.0000 mg | ORAL_TABLET | Freq: Four times a day (QID) | ORAL | Status: DC | PRN
  Administered 2013-04-07: 650 mg via ORAL
  Filled 2013-04-04 (×2): qty 2

## 2013-04-04 MED ORDER — SODIUM CHLORIDE 0.9 % IJ SOLN
10.0000 mL | INTRAMUSCULAR | Status: DC | PRN
  Administered 2013-04-05 – 2013-04-07 (×3): 10 mL

## 2013-04-04 MED ORDER — PRO-STAT SUGAR FREE PO LIQD
30.0000 mL | Freq: Two times a day (BID) | ORAL | Status: DC
  Administered 2013-04-06 (×2): 30 mL via ORAL
  Filled 2013-04-04 (×7): qty 30

## 2013-04-04 NOTE — Progress Notes (Signed)
MD notified twice of patient's 101.7 temp with no prns for fever.  No return call or new orders.  Will notify oncoming nurse and continue to monitor patient.

## 2013-04-04 NOTE — Progress Notes (Signed)
[facility]   Day # 3 ceftriaxone  Subjective: Pt minimally responsive on face mask   Antibiotics:  Anti-infectives   Start     Dose/Rate Route Frequency Ordered Stop   04/02/13 1100  cefTRIAXone (ROCEPHIN) 2 g in dextrose 5 % 50 mL IVPB     2 g 100 mL/hr over 30 Minutes Intravenous Every 24 hours 04/02/13 1036     03/31/13 2200  ceFEPIme (MAXIPIME) 2 g in dextrose 5 % 50 mL IVPB  Status:  Discontinued     2 g 100 mL/hr over 30 Minutes Intravenous 3 times per day 03/31/13 1537 04/02/13 1036   03/31/13 1000  vancomycin (VANCOCIN) IVPB 1000 mg/200 mL premix  Status:  Discontinued     1,000 mg 200 mL/hr over 60 Minutes Intravenous Every 12 hours 03/30/13 2144 04/01/13 0938   03/30/13 2300  cefTAZidime (FORTAZ) 2 g in dextrose 5 % 50 mL IVPB  Status:  Discontinued     2 g 100 mL/hr over 30 Minutes Intravenous 3 times per day 03/30/13 2132 03/31/13 1513   03/30/13 2200  ceFAZolin (ANCEF) IVPB 1 g/50 mL premix  Status:  Discontinued     1 g 100 mL/hr over 30 Minutes Intravenous Every 8 hours 03/30/13 1723 03/30/13 2112   03/30/13 2100  vancomycin (VANCOCIN) IVPB 1000 mg/200 mL premix  Status:  Discontinued     1,000 mg 200 mL/hr over 60 Minutes Intravenous Every 12 hours 03/30/13 1723 03/30/13 2144   03/30/13 1641  vancomycin (VANCOCIN) powder  Status:  Discontinued       As needed 03/30/13 1649 03/30/13 1703   03/29/13 1430  ceFAZolin (ANCEF) IVPB 1 g/50 mL premix     1 g 100 mL/hr over 30 Minutes Intravenous  Once 03/29/13 1417 03/29/13 1606      Medications: Scheduled Meds: . antiseptic oral rinse  15 mL Mouth Rinse BID  . cefTRIAXone (ROCEPHIN)  IV  2 g Intravenous Q24H  . docusate sodium  100 mg Oral BID  . feeding supplement  1 Container Oral TID BM  . insulin aspart  0-5 Units Subcutaneous QHS  . insulin aspart  0-9 Units Subcutaneous TID WC  . insulin aspart  3 Units Subcutaneous TID WC  . potassium chloride  20 mEq Oral BID    Continuous Infusions:   PRN Meds:.acetaminophen, menthol-cetylpyridinium, methocarbamol (ROBAXIN) IV, methocarbamol, ondansetron (ZOFRAN) IV, oxyCODONE, phenol, sodium chloride, zolpidem   Objective: Weight change:   Intake/Output Summary (Last 24 hours) at 04/04/13 1253 Last data filed at 04/04/13 4098  Gross per 24 hour  Intake    358 ml  Output    800 ml  Net   -442 ml   Blood pressure 122/58, pulse 95, temperature 99.7 F (37.6 C), temperature source Rectal, resp. rate 24, height 5\' 8"  (1.727 m), weight 204 lb 12.9 oz (92.9 kg), SpO2 94.00%. Temp:  [97.7 F (36.5 C)-101.7 F (38.7 C)] 99.7 F (37.6 C) (08/18 1000) Pulse Rate:  [76-109] 95 (08/18 1000) Resp:  [18-24] 24 (08/18 1000) BP: (114-165)/(58-77) 122/58 mmHg (08/18 1000) SpO2:  [90 %-94 %] 94 % (08/18 1154)  Physical Exam: General: Alert and awake, moaning with face mask CVS tachy ate, normal r, no murmur rubs or gallops  Chest: fairly clear anteriorly Abdomen: soft nontender, nondistended, normal bowel sounds,  Skin: no rashes dressing in place  Neuro: nonfocal,    Lab Results:  Recent Labs  04/03/13 0535 04/04/13 0920  WBC  9.7 10.7*  HGB 7.1* 7.2*  HCT 20.1* 20.6*  PLT 62* 68*    BMET  Recent Labs  04/03/13 0535 04/04/13 0920  NA 138 140  K 3.3* 3.6  CL 107 109  CO2 21 22  GLUCOSE 137* 127*  BUN 33* 25*  CREATININE 0.76 0.84  CALCIUM 8.9 9.3    Micro Results: Recent Results (from the past 240 hour(s))  ANAEROBIC CULTURE     Status: None   Collection Time    03/30/13  3:50 PM      Result Value Range Status   Specimen Description WOUND BACK   Final   Special Requests NONE   Final   Gram Stain     Final   Value: MODERATE WBC PRESENT,BOTH PMN AND MONONUCLEAR     NO SQUAMOUS EPITHELIAL CELLS SEEN     NO ORGANISMS SEEN     Performed at [facility]     Performed at [facility]   Culture     Final   Value: NO ANAEROBES ISOLATED     Performed at [facility]   Report Status 04/04/2013 FINAL   Final  WOUND CULTURE     Status: None   Collection Time    03/30/13  3:50 PM      Result Value Range Status   Specimen Description WOUND BACK   Final   Special Requests NONE   Final   Gram Stain     Final   Value: MODERATE WBC PRESENT,BOTH PMN AND MONONUCLEAR     NO SQUAMOUS EPITHELIAL CELLS SEEN     NO ORGANISMS SEEN     Performed at [facility]     Performed at [facility]   Culture     Final   Value: FEW SERRATIA MARCESCENS     Performed at [facility]   Report Status 04/02/2013 FINAL   Final   Organism ID, Bacteria SERRATIA MARCESCENS   Final  GRAM STAIN     Status: None   Collection Time    03/30/13  3:50 PM      Result Value Range Status   Specimen Description WOUND BACK   Final   Special Requests NONE   Final   Gram Stain     Final   Value: MODERATE WBC PRESENT,BOTH PMN AND MONONUCLEAR     NO ORGANISMS SEEN   Report Status 03/30/2013 FINAL   Final  MRSA PCR SCREENING     Status: None   Collection Time    03/30/13 10:34 PM      Result Value Range Status   MRSA by PCR NEGATIVE  NEGATIVE Final   Comment:            The GeneXpert MRSA Assay (FDA     approved for NASAL specimens     only), is one component of a     comprehensive MRSA colonization     surveillance program. It is not     intended to diagnose MRSA     infection nor to guide or     monitor treatment for     MRSA infections.  URINE CULTURE     Status: None   Collection Time    03/31/13 11:17 PM      Result Value Range Status   Specimen Description URINE, CATHETERIZED   Final   Special Requests CX ADDED AT [id number]   Final   Culture  Setup Time  Final   Value: 04/01/2013 00:58     Performed at [facility] Count     Final   Value: NO GROWTH     Performed at [facility]   Culture     Final   Value: NO GROWTH     Performed at [facility]   Report Status 04/02/2013 FINAL   Final     Studies/Results: Dg Chest Port 1 View  04/04/2013   *RADIOLOGY REPORT*  Clinical Data: Shortness of breath  PORTABLE CHEST - 1 VIEW  Comparison: 04/02/2013  Findings: The cardiac shadow is stable.  Postsurgical changes are again noted.  Increased density is again seen in the bases bilaterally and not significantly changed.  No new focal abnormality is seen.  IMPRESSION: Persistent bibasilar atelectasis.   Original Report Authenticated By: Alcide Clever, M.D.      Assessment/Plan: [name] [name] is a 79 y.o. 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

## 2013-04-04 NOTE — Progress Notes (Signed)
    Subjective: Procedure(s) (LRB): HARDWARE REMOVAL/IRRIGATION & DEBRIDEMENT OF WOUND ON BACK (N/A) 5 Days Post-Op  Patient reports pain as 3 on 0-10 scale.  Reports decreased leg pain reports incisional back pain   Positive void Negative bowel movement Positive flatus Negative chest pain or shortness of breath  Objective: Vital signs in last 24 hours: Temp:  [97.7 F (36.5 C)-101.7 F (38.7 C)] 101.7 F (38.7 C) (08/18 0622) Pulse Rate:  [76-109] 109 (08/18 0622) Resp:  [18] 18 (08/18 0622) BP: (114-165)/(62-77) 156/72 mmHg (08/18 0622) SpO2:  [90 %-94 %] 90 % (08/18 0622)  Intake/Output from previous day: 08/17 0701 - 08/18 0700 In: 698 [P.O.:598; IV Piggyback:100] Out: 800 [Urine:800]  Labs:  Recent Labs  04/02/13 0755 04/03/13 0535  WBC 9.9 9.7  RBC 2.49* 2.31*  HCT 21.3* 20.1*  PLT 46* 62*    Recent Labs  04/02/13 0755 04/03/13 0535  NA 138 138  K 3.8 3.3*  CL 106 107  CO2 21 21  BUN 38* 33*  CREATININE 0.92 0.76  GLUCOSE 128* 137*  CALCIUM 8.7 8.9   No results found for this basename: LABPT, INR,  in the last 72 hours  Physical Exam: Neurologically intact ABD soft Intact pulses distally Incision: dressing C/D/I Compartment soft  Assessment/Plan: Patient stable  Patient does not have osteomyelitis of the spine - patient had post-operative wound infection requiring I+D and removal of hardware. Nutritional consult requested to address malnutrition PICC line requested for long term Abx (6week) CAD/DM/HTN per medical management ID team also involved. Continue mobilization with physical therapy Continue care  Discharge to SNF once medically stable and cleared by Medical team  Venita Lick, MD [facility] [phone]

## 2013-04-04 NOTE — Progress Notes (Signed)
[facility] PROGRESS NOTE Severino Paolo ZOX:[id number] DOB: [birth year] DOA: 03/29/2013 PCP: Provider Not In System  Assessment/Plan: Hardware-Associated T-L Osteomyelitis -Cx with Serratia. -Continue ceftriaxone. -Per ID will need 6-8 weeks of IV antibiotics, followed possibly by a protracted course of oral antibiotics.  Acute Hypoxemic Respiratory Failure -restarted on oxygen 8/18.   -CXR appears to show some pulm vasc congestion.  Lasix 40 mg IV x 1 -CT angio without PE. 8/15  Chest Pain - Troponin WNL - EKG NSR - Likely secondary to spinal infection.  Sepsis -2/2 spinal infection. -Fever 101.7 on 8/18 am.  Bld cultures drawn.  ID on board.  CXR no infiltrates -previous urine culture 8/14 - no growth.  HTN -BP well controlled off of home medications.  ARF -Resolved at present.  DM -Fair control. -No changes to regimen.  Thrombocytopenia -Likely related to sepsis picture. -No signs of active bleeding.  Post-op Anemia -Transfusion if <7.0.  Anasarca -DC IVF.  Lasix 40 mg IV x 1 8/18. -Encourage better nutrition. -Mobilize as soon as able.   Code Status: Full Family Communication: Patient only.  Disposition Plan: SNF   Antibiotics:  Rocephin   Subjective: Feeling poorly today with increased work of breathing.  Complaining of chest pain in his central chest.  Worse with movement, breathing and palpation.  Objective: Filed Vitals:   04/04/13 0215 04/04/13 0622 04/04/13 1000 04/04/13 1154  BP: 165/77 156/72 122/58   Pulse: 84 109 95   Temp: 97.7 F (36.5 C) 101.7 F (38.7 C) 99.7 F (37.6 C)   TempSrc: Oral Oral Rectal   Resp: 18 18 24    Height:      Weight:      SpO2: 94% 90% 92% 94%    Intake/Output Summary (Last 24 hours) at 04/04/13 1331 Last data filed at 04/04/13 8119  Gross per 24 hour  Intake    240 ml  Output    800 ml  Net   -560 ml   Filed Weights   03/30/13 1946 04/01/13 2004  Weight: 86.7 kg (191 lb 2.2  oz) 92.9 kg (204 lb 12.9 oz)    Exam:   General:  Awake, appears in slight distress, slightly diaphoretic  Cardiovascular: RRR, no m/r/g  Respiratory: CTA B, but with slight increased work of breathing.  Abdomen: S/NT/+BS/no masses  Extremities: 1-2 edema in all 4 extremities  Neurologic:  Non-focal  Data Reviewed: Basic Metabolic Panel:  Recent Labs Lab 03/31/13 2200 04/01/13 1100 04/02/13 0755 04/03/13 0535 04/04/13 0920  NA 136 138 138 138 140  K 3.5 3.5 3.8 3.3* 3.6  CL 102 103 106 107 109  CO2 20 20 21 21 22   GLUCOSE 71 103* 128* 137* 127*  BUN 29* 34* 38* 33* 25*  CREATININE 1.12 1.07 0.92 0.76 0.84  CALCIUM 8.7 8.9 8.7 8.9 9.3   Liver Function Tests:  Recent Labs Lab 04/01/13 1100 04/02/13 0755  AST 27 25  ALT 13 12  ALKPHOS 62 59  BILITOT 2.7* 2.4*  PROT 4.4* 4.1*  ALBUMIN 1.5* 1.4*   CBC:  Recent Labs Lab 03/31/13 2200 04/01/13 1100 04/02/13 0755 04/03/13 0535 04/04/13 0920  WBC 9.4 12.6* 9.9 9.7 10.7*  NEUTROABS  --  11.8* 9.0*  --   --   HGB 7.6* 7.4* 7.1* 7.1* 7.2*  HCT 21.5* 21.0* 21.3* 20.1* 20.6*  MCV 88.8 86.1 85.5 87.0 88.8  PLT 41* 58* 46* 62* 68*   Cardiac Enzymes:  Recent Labs Lab 03/31/13 1758 04/01/13  0030 04/01/13 1100 04/04/13 0941  TROPONINI <0.30 <0.30 <0.30 <0.30  CBG:  Recent Labs Lab 04/03/13 1133 04/03/13 1711 04/03/13 2127 04/04/13 0755 04/04/13 1136  GLUCAP 173* 126* 109* 106* 129*    Recent Results (from the past 240 hour(s))  ANAEROBIC CULTURE     Status: None   Collection Time    03/30/13  3:50 PM      Result Value Range Status   Specimen Description WOUND BACK   Final   Special Requests NONE   Final   Gram Stain     Final   Value: MODERATE WBC PRESENT,BOTH PMN AND MONONUCLEAR     NO SQUAMOUS EPITHELIAL CELLS SEEN     NO ORGANISMS SEEN     Performed at [facility]     Performed at [facility]   Culture     Final   Value: NO ANAEROBES ISOLATED     Performed at  [facility]   Report Status 04/04/2013 FINAL   Final  WOUND CULTURE     Status: None   Collection Time    03/30/13  3:50 PM      Result Value Range Status   Specimen Description WOUND BACK   Final   Special Requests NONE   Final   Gram Stain     Final   Value: MODERATE WBC PRESENT,BOTH PMN AND MONONUCLEAR     NO SQUAMOUS EPITHELIAL CELLS SEEN     NO ORGANISMS SEEN     Performed at [facility]     Performed at [facility]   Culture     Final   Value: FEW SERRATIA MARCESCENS     Performed at [facility]   Report Status 04/02/2013 FINAL   Final   Organism ID, Bacteria SERRATIA MARCESCENS   Final  GRAM STAIN     Status: None   Collection Time    03/30/13  3:50 PM      Result Value Range Status   Specimen Description WOUND BACK   Final   Special Requests NONE   Final   Gram Stain     Final   Value: MODERATE WBC PRESENT,BOTH PMN AND MONONUCLEAR     NO ORGANISMS SEEN   Report Status 03/30/2013 FINAL   Final  MRSA PCR SCREENING     Status: None   Collection Time    03/30/13 10:34 PM      Result Value Range Status   MRSA by PCR NEGATIVE  NEGATIVE Final   Comment:            The GeneXpert MRSA Assay (FDA     approved for NASAL specimens     only), is one component of a     comprehensive MRSA colonization     surveillance program. It is not     intended to diagnose MRSA     infection nor to guide or     monitor treatment for     MRSA infections.  URINE CULTURE     Status: None   Collection Time    03/31/13 11:17 PM      Result Value Range Status   Specimen Description URINE, CATHETERIZED   Final   Special Requests CX ADDED AT [id number]   Final   Culture  Setup Time     Final   Value: 04/01/2013 00:58     Performed at [facility]   Colony Count     Final  Value: NO GROWTH     Performed at [facility]   Culture     Final   Value: NO GROWTH     Performed at [facility]   Report Status 04/02/2013 FINAL    Final     Studies: Dg Chest Port 1 View  04/04/2013   *RADIOLOGY REPORT*  Clinical Data: Shortness of breath  PORTABLE CHEST - 1 VIEW  Comparison: 04/02/2013  Findings: The cardiac shadow is stable.  Postsurgical changes are again noted.  Increased density is again seen in the bases bilaterally and not significantly changed.  No new focal abnormality is seen.  IMPRESSION: Persistent bibasilar atelectasis.   Original Report Authenticated By: Alcide Clever, M.D.    Scheduled Meds: . antiseptic oral rinse  15 mL Mouth Rinse BID  . cefTRIAXone (ROCEPHIN)  IV  2 g Intravenous Q24H  . docusate sodium  100 mg Oral BID  . feeding supplement  1 Container Oral TID BM  . insulin aspart  0-5 Units Subcutaneous QHS  . insulin aspart  0-9 Units Subcutaneous TID WC  . insulin aspart  3 Units Subcutaneous TID WC  . potassium chloride  20 mEq Oral BID   Continuous Infusions:    Active Problems:   CAD (coronary artery disease)   DM2 (diabetes mellitus, type 2)   HTN (hypertension)   Osteomyelitis of spine   Protein-calorie malnutrition, severe   Acute respiratory failure    Conley Canal  [facility] Pager [phone]  If 7PM-7AM, please contact night-coverage at [email or web address], password [email or web address] 04/04/2013, 1:31 PM  LOS: 6 days

## 2013-04-04 NOTE — Progress Notes (Signed)
PT/OT Cancellation Note  Patient Details Name: Galo Sayed MRN: [id number] DOB: September 23, 1933   Cancelled Treatment:    Reason Eval/Treat Not Completed: Medical issues which prohibited therapy.  Pt now getting stat EKG and to get stat chest x/ray.  Will f/u another time.     Sunny Schlein, [place] [phone] 04/04/2013, 9:15 AM

## 2013-04-04 NOTE — Progress Notes (Signed)
During morning progression rounds patient was found to be drowsy and diaphoretic. His oxygen saturation was 90% on room air. Was placed on 2 liters of oxygen. He increased to 92%. To ensure patient was well oxygenated Medical team decided to place the patient on a venturi mask. His O2 sats increased 98% once on the mask. Orders were also given for telemetry. A foley catheter was placed after receiving IV lasix to accurately assess his output. Md. Shon Baton was notified and made aware of the patient's status. Will continue to monitor patient status and address any need that arise.

## 2013-04-04 NOTE — Progress Notes (Signed)
Speech Language Pathology Dysphagia Treatment Patient Details Name: [name] [name] MRN: [id number] DOB: 08-Jul-1934 Today's Date: 04/04/2013 Time: 1430-1440 SLP Time Calculation (min): 10 min  Assessment / Plan / Recommendation Clinical Impression  Paged by dietitian due to MD/RN asking if ST would see pt. today.  He has had increase in respiratory difficulty today and is on a venturi mask.  Observed with graham cracker and water via cup/straw.  Prolonged oral phase with min lingual residue.  No overt s/s aspiration however risk is increased with current respiratory needs.  Recommend to continue Dys 2 diet texture with thin liquids via straw small sips.  Repeat CXR revealed  No new focal abnormality. Only allow pt. to eat when he is not exhibiting increased work of breathing and is alert and allow rest breaks.  ST will check on pt. tomorrow    Diet Recommendation  Continue with Current Diet: Dysphagia 2 (fine chop);Thin liquid    SLP Plan Continue with current plan of care   Pertinent Vitals/Pain none   Swallowing Goals  SLP Swallowing Goals Patient will consume recommended diet without observed clinical signs of aspiration with: Total assistance Swallow Study Goal #1 - Progress: Progressing toward goal Patient will utilize recommended strategies during swallow to increase swallowing safety with: Total assistance Swallow Study Goal #2 - Progress: Progressing toward goal  General Temperature Spikes Noted: Yes Respiratory Status: Other (comment) (venturi) Behavior/Cognition: Alert;Requires cueing Oral Cavity - Dentition: Dentures, top;Dentures, bottom Patient Positioning: Upright in bed  Oral Cavity - Oral Hygiene Does patient have any of the following "at risk" factors?: Oxygen therapy - cannula, mask, simple oxygen devices;Saliva - thick, dry mouth Patient is HIGH RISK - Oral Care Protocol followed (see row info): Yes   Dysphagia Treatment Treatment focused on: Skilled observation  of diet tolerance Treatment Methods/Modalities: Skilled observation Patient observed directly with PO's: Yes Type of PO's observed: Regular;Thin liquids Feeding: Able to feed self;Needs set up;Needs assist Liquids provided via: Cup;Straw Oral Phase Signs & Symptoms: Prolonged bolus formation;Prolonged oral phase Type of cueing: Verbal Amount of cueing: Minimal   GO     Royce Macadamia M.Ed [employer] [phone]  04/04/2013

## 2013-04-04 NOTE — Progress Notes (Signed)
NUTRITION CONSULT/FOLLOW UP  Intervention:    Editor, commissioning 3 times daily (250 kcals, 9 gm protein per 8 fl oz carton)  Add Prostat liquid protein 30 ml twice daily (100 kcals, 15 gm protein per dose) RD to follow for nutrition care plan  Nutrition Dx:   Increased nutrient needs related to wounds with VAC in place as evidenced by estimated nutrition needs; ongoing.   Goal:   Pt to meet >/= 90% of their estimated nutrition needs, unmet  Monitor:   PO & supplemental intake, weight, labs, I/O's  Assessment:   Recent admission for T12 burst fx. 13 days out from initial surgery. S/P I&D and removal of hardware 8/13. Patient with a large incision on back from spinal surgery.   Patient s/p procedure 8/13: HARDWARE REMOVAL/IRRIGATION & DEBRIDEMENT OF WOUND ON BACK   Initial nutrition assessment completed 8/15.  Patient currently on Venti-mask.  S/p bedside swallow evaluation 8/15.  SLP recommending Dysphagia 2, thin liquid diet.  PO intake poor at 10-50% per flowsheet records.  Resource Breeze supplement added 3 times daily, 8/17.  Would benefit from additional protein ---> RD to order.  Per ID will need 6-8 weeks of IV ABX.  Height: Ht Readings from Last 1 Encounters:  03/30/13 5\' 8"  (1.727 m)    Weight Status:   Wt Readings from Last 1 Encounters:  04/01/13 204 lb 12.9 oz (92.9 kg)    Re-estimated needs:  Kcal: 2000-2200 Protein: 115-130 gm Fluid: 2.0-2.2 L  Skin: wound VAC to back   Diet Order: Dysphagia 2, thin liquids   Intake/Output Summary (Last 24 hours) at 04/04/13 1349 Last data filed at 04/04/13 1610  Gross per 24 hour  Intake    240 ml  Output    800 ml  Net   -560 ml    Labs:   Recent Labs Lab 04/02/13 0755 04/03/13 0535 04/04/13 0920  NA 138 138 140  K 3.8 3.3* 3.6  CL 106 107 109  CO2 21 21 22   BUN 38* 33* 25*  CREATININE 0.92 0.76 0.84  CALCIUM 8.7 8.9 9.3  GLUCOSE 128* 137* 127*    CBG (last 3)   Recent Labs   04/03/13 2127 04/04/13 0755 04/04/13 1136  GLUCAP 109* 106* 129*    Scheduled Meds: . antiseptic oral rinse  15 mL Mouth Rinse BID  . cefTRIAXone (ROCEPHIN)  IV  2 g Intravenous Q24H  . docusate sodium  100 mg Oral BID  . feeding supplement  1 Container Oral TID BM  . insulin aspart  0-5 Units Subcutaneous QHS  . insulin aspart  0-9 Units Subcutaneous TID WC  . insulin aspart  3 Units Subcutaneous TID WC  . potassium chloride  20 mEq Oral BID    Continuous Infusions:   Maureen Chatters, RD, LDN Pager #: [phone] After-Hours Pager #: [phone]

## 2013-04-05 LAB — CBC
MCH: 29.8 pg (ref 26.0–34.0)
MCHC: 33.3 g/dL (ref 30.0–36.0)
Platelets: 73 10*3/uL — ABNORMAL LOW (ref 150–400)
RBC: 2.08 MIL/uL — ABNORMAL LOW (ref 4.22–5.81)
RDW: 16.6 % — ABNORMAL HIGH (ref 11.5–15.5)

## 2013-04-05 LAB — BASIC METABOLIC PANEL
Calcium: 9.1 mg/dL (ref 8.4–10.5)
GFR calc Af Amer: >90 mL/min (ref >90)
GFR calc non Af Amer: 86 mL/min — ABNORMAL LOW (ref >90)
Sodium: 143 mEq/L (ref 135–145)

## 2013-04-05 LAB — GLUCOSE, CAPILLARY
Glucose-Capillary: 126 mg/dL — ABNORMAL HIGH (ref 70–99)
Glucose-Capillary: 149 mg/dL — ABNORMAL HIGH (ref 70–99)

## 2013-04-05 LAB — HIV-1 RNA QUANT-NO REFLEX-BLD
HIV 1 RNA Quant: <20 copies/mL (ref <20)
HIV-1 RNA Quant, Log: <1.3 {Log} (ref <1.30)

## 2013-04-05 MED ORDER — FUROSEMIDE 10 MG/ML IJ SOLN
40.0000 mg | Freq: Once | INTRAMUSCULAR | Status: AC
  Administered 2013-04-05: 40 mg via INTRAVENOUS
  Filled 2013-04-05: qty 4

## 2013-04-05 MED ORDER — FUROSEMIDE 10 MG/ML IJ SOLN: 40.0000 mg | Freq: Once | INTRAMUSCULAR | Status: DC

## 2013-04-05 MED ORDER — POTASSIUM CHLORIDE CRYS ER 20 MEQ PO TBCR
20.0000 meq | EXTENDED_RELEASE_TABLET | Freq: Two times a day (BID) | ORAL | Status: DC
  Administered 2013-04-05 (×2): 20 meq via ORAL
  Filled 2013-04-05 (×3): qty 1

## 2013-04-05 NOTE — Consult Note (Addendum)
WOC consult Note Reason for Consult:Called to room to assist with troubleshooting vac.  Discussed plan of care with bedside nurse.  Vac was turned off at some point for unknown reason; no nursing notes in chart to indicate what problem was occurring.  Vac black foam intact over wound to back but no track pad intact, and cannister has been removed from vac machine which is turned off at this time. According to perioperative notes, vac dressing is over a closed incision from the OR. Prepared to change vac dressing and removed sponge to reveal well-approximated suture line without redness, drainage, or odor.  Bedside nurse entered the room as dressing was removed.  She states she just got off the phone with Dr Shon Baton who ordered the vac D/Ced and foam dressings applied.  Applied 4 Allevyn foam dressings over incision line to mid back.  Nurse states she will write verbal orders which were obtained from Dr Shon Baton.  Pt with denuded partial thickness patchy areas on buttocks, refer to previous WOC consult note on 8/14.  Appears to be improving; pink and dry with small patchy areas of redness, no open wounds at this time.  Plan:  Barrier cream to protect and and promote healing.  Air mattress in place to reduce pressure.  Please re-consult if further assistance is needed.  Thank-you,  Cammie Mcgee MSN, RN, CWOCN, [place], CNS [phone]

## 2013-04-05 NOTE — Progress Notes (Addendum)
MD on call DR.Hewitt was called,re;wound vac;& he said to leave the way it is for now.

## 2013-04-05 NOTE — Progress Notes (Signed)
[facility] PROGRESS NOTE [name] [name] ZOX:[id number] DOB: 11-14-1933 DOA: 03/29/2013 PCP: Provider Not In System  Assessment/Plan: Hardware-Associated T-L Osteomyelitis -Cx with Serratia. -Continue ceftriaxone. -Per ID will need 6-8 weeks of IV antibiotics, followed possibly by a protracted course of oral antibiotics. -Wound vac removed 8/19  Acute Hypoxemic Respiratory Failure -restarted on oxygen 8/18.   -CXR appears to show some pulm vasc congestion.  8/18.  Received IV lasix with improvement. -CT angio without PE. 8/15 - Will transfuse 2 units PRBCs 8/19 which should help respiratory status as well.  Chest Pain - No chest pain on 8/19 - Troponin WNL - EKG NSR - Likely secondary to spinal infection.  Sepsis - Resolved. -2/2 spinal infection. -Fever 101.7 on 8/18 am.  Bld cultures drawn (ngtd 8/19).  ID on board.  CXR no infiltrates -previous urine culture 8/14 - no growth.  HTN -BP well controlled off of home medications.  ARF -Resolved at present.  DM -Fair control. -No changes to regimen.  Thrombocytopenia -Likely related to sepsis picture. -Platelets slowly improving. -No signs of active bleeding.  Post-op Anemia -Will transfuse 2 units of PRBCs on 8/19 for a hgb of 6.2  Anasarca -DC IVF.   -IV lasix -Encourage better nutrition. -Mobilize as soon as able.  Hypokalemia -Likely secondary to treatment with lasix -Oral repletion. -Monitor electrolytes.   Code Status: Full Family Communication: Patient only.  Disposition Plan: SNF   Antibiotics:  Rocephin   Subjective: Feeling better today, making jokes.  States repeatedly that he wants to go home.    Objective: Filed Vitals:   04/04/13 1415 04/04/13 2223 04/05/13 0245 04/05/13 0608  BP: 148/78 136/67 121/67 119/62  Pulse: 92 91 84 81  Temp: 98.2 F (36.8 C) 97.5 F (36.4 C) 98.5 F (36.9 C) 98.2 F (36.8 C)  TempSrc: Oral Oral Oral Oral  Resp: 24 20 22 22    Height:      Weight:      SpO2: 94% 97% 95% 95%    Intake/Output Summary (Last 24 hours) at 04/05/13 1124 Last data filed at 04/05/13 8119  Gross per 24 hour  Intake    110 ml  Output   1575 ml  Net  -1465 ml   Filed Weights   03/30/13 1946 04/01/13 2004  Weight: 86.7 kg (191 lb 2.2 oz) 92.9 kg (204 lb 12.9 oz)    Exam:   General:  Awake, Pleasant, appears pale but not in respiratory distress  Cardiovascular: RRR, no m/r/g  Respiratory: CTA B, but with slight increased work of breathing.  Abdomen: S/NT/+BS/no masses  Extremities: 2+ edema in all 4 extremities  Neurologic:  Non-focal  Data Reviewed: Basic Metabolic Panel:  Recent Labs Lab 04/01/13 1100 04/02/13 0755 04/03/13 0535 04/04/13 0920 04/05/13 0608  NA 138 138 138 140 143  K 3.5 3.8 3.3* 3.6 3.1*  CL 103 106 107 109 110  CO2 20 21 21 22 25   GLUCOSE 103* 128* 137* 127* 127*  BUN 34* 38* 33* 25* 26*  CREATININE 1.07 0.92 0.76 0.84 0.76  CALCIUM 8.9 8.7 8.9 9.3 9.1   Liver Function Tests:  Recent Labs Lab 04/01/13 1100 04/02/13 0755  AST 27 25  ALT 13 12  ALKPHOS 62 59  BILITOT 2.7* 2.4*  PROT 4.4* 4.1*  ALBUMIN 1.5* 1.4*   CBC:  Recent Labs Lab 03/31/13 2200 04/01/13 1100 04/02/13 0755 04/03/13 0535 04/04/13 0920 04/05/13 0608  WBC 9.4 12.6* 9.9 9.7 10.7* 6.9  NEUTROABS  --  11.8* 9.0*  --   --   --   HGB 7.6* 7.4* 7.1* 7.1* 7.2* 6.2*  HCT 21.5* 21.0* 21.3* 20.1* 20.6* 18.6*  MCV 88.8 86.1 85.5 87.0 88.8 89.4  PLT 41* 58* 46* 62* 68* 73*   Cardiac Enzymes:  Recent Labs Lab 03/31/13 1758 04/01/13 0030 04/01/13 1100 04/04/13 0941  TROPONINI <0.30 <0.30 <0.30 <0.30  CBG:  Recent Labs Lab 04/04/13 0755 04/04/13 1136 04/04/13 1623 04/04/13 2221 04/05/13 0727  GLUCAP 106* 129* 133* 118* 126*    Recent Results (from the past 240 hour(s))  ANAEROBIC CULTURE     Status: None   Collection Time    03/30/13  3:50 PM      Result Value Range Status   Specimen  Description WOUND BACK   Final   Special Requests NONE   Final   Gram Stain     Final   Value: MODERATE WBC PRESENT,BOTH PMN AND MONONUCLEAR     NO SQUAMOUS EPITHELIAL CELLS SEEN     NO ORGANISMS SEEN     Performed at [facility]     Performed at [facility]   Culture     Final   Value: NO ANAEROBES ISOLATED     Performed at [facility]   Report Status 04/04/2013 FINAL   Final  WOUND CULTURE     Status: None   Collection Time    03/30/13  3:50 PM      Result Value Range Status   Specimen Description WOUND BACK   Final   Special Requests NONE   Final   Gram Stain     Final   Value: MODERATE WBC PRESENT,BOTH PMN AND MONONUCLEAR     NO SQUAMOUS EPITHELIAL CELLS SEEN     NO ORGANISMS SEEN     Performed at [facility]     Performed at [facility]   Culture     Final   Value: FEW SERRATIA MARCESCENS     Performed at [facility]   Report Status 04/02/2013 FINAL   Final   Organism ID, Bacteria SERRATIA MARCESCENS   Final  GRAM STAIN     Status: None   Collection Time    03/30/13  3:50 PM      Result Value Range Status   Specimen Description WOUND BACK   Final   Special Requests NONE   Final   Gram Stain     Final   Value: MODERATE WBC PRESENT,BOTH PMN AND MONONUCLEAR     NO ORGANISMS SEEN   Report Status 03/30/2013 FINAL   Final  MRSA PCR SCREENING     Status: None   Collection Time    03/30/13 10:34 PM      Result Value Range Status   MRSA by PCR NEGATIVE  NEGATIVE Final   Comment:            The GeneXpert MRSA Assay (FDA     approved for NASAL specimens     only), is one component of a     comprehensive MRSA colonization     surveillance program. It is not     intended to diagnose MRSA     infection nor to guide or     monitor treatment for     MRSA infections.  URINE CULTURE     Status: None   Collection Time    03/31/13 11:17 PM      Result Value Range Status  Specimen Description URINE, CATHETERIZED   Final    Special Requests CX ADDED AT [id number]   Final   Culture  Setup Time     Final   Value: 04/01/2013 00:58     Performed at [facility]   Colony Count     Final   Value: NO GROWTH     Performed at [facility]   Culture     Final   Value: NO GROWTH     Performed at [facility]   Report Status 04/02/2013 FINAL   Final  CULTURE, BLOOD (ROUTINE X 2)     Status: None   Collection Time    04/04/13  9:20 AM      Result Value Range Status   Specimen Description BLOOD RIGHT ANTECUBITAL   Final   Special Requests BOTTLES DRAWN AEROBIC ONLY 10CC   Final   Culture  Setup Time     Final   Value: 04/04/2013 16:48     Performed at [facility]   Culture     Final   Value:        BLOOD CULTURE RECEIVED NO GROWTH TO DATE CULTURE WILL BE HELD FOR 5 DAYS BEFORE ISSUING A FINAL NEGATIVE REPORT     Performed at [facility]   Report Status PENDING   Incomplete  CULTURE, BLOOD (ROUTINE X 2)     Status: None   Collection Time    04/04/13  9:50 AM      Result Value Range Status   Specimen Description BLOOD HAND RIGHT   Final   Special Requests BOTTLES DRAWN AEROBIC ONLY 10CC   Final   Culture  Setup Time     Final   Value: 04/04/2013 16:48     Performed at [facility]   Culture     Final   Value:        BLOOD CULTURE RECEIVED NO GROWTH TO DATE CULTURE WILL BE HELD FOR 5 DAYS BEFORE ISSUING A FINAL NEGATIVE REPORT     Performed at [facility]   Report Status PENDING   Incomplete     Studies: Dg Chest Port 1 View  04/04/2013   *RADIOLOGY REPORT*  Clinical Data: Shortness of breath  PORTABLE CHEST - 1 VIEW  Comparison: 04/02/2013  Findings: The cardiac shadow is stable.  Postsurgical changes are again noted.  Increased density is again seen in the bases bilaterally and not significantly changed.  No new focal abnormality is seen.  IMPRESSION: Persistent bibasilar atelectasis.   Original Report Authenticated By: Alcide Clever, M.D.     Scheduled Meds: . antiseptic oral rinse  15 mL Mouth Rinse BID  . cefTRIAXone (ROCEPHIN)  IV  2 g Intravenous Q24H  . docusate sodium  100 mg Oral BID  . feeding supplement  30 mL Oral BID WC  . feeding supplement  1 Container Oral TID BM  . furosemide  40 mg Intravenous Once  . insulin aspart  0-5 Units Subcutaneous QHS  . insulin aspart  0-9 Units Subcutaneous TID WC  . insulin aspart  3 Units Subcutaneous TID WC  . potassium chloride  20 mEq Oral BID   Continuous Infusions:    Active Problems:   CAD (coronary artery disease)   DM2 (diabetes mellitus, type 2)   HTN (hypertension)   Osteomyelitis of spine   Protein-calorie malnutrition, severe   Acute respiratory failure    Stephani Police, PA-C  [facility] Pager  [phone]  If 7PM-7AM, please contact night-coverage at [email or web address], password [facility] 04/05/2013, 11:24 AM  LOS: 7 days     Patient seen and examined. Agree with above note.  Peggye Pitt, MD [facility] Pager: [phone]

## 2013-04-05 NOTE — Progress Notes (Signed)
[facility]    Day # 4 ceftriaxone  Subjective: Pt much more awake and watching tv and taknig to himself   Antibiotics:  Anti-infectives   Start     Dose/Rate Route Frequency Ordered Stop   04/02/13 1100  cefTRIAXone (ROCEPHIN) 2 g in dextrose 5 % 50 mL IVPB     2 g 100 mL/hr over 30 Minutes Intravenous Every 24 hours 04/02/13 1036     03/31/13 2200  ceFEPIme (MAXIPIME) 2 g in dextrose 5 % 50 mL IVPB  Status:  Discontinued     2 g 100 mL/hr over 30 Minutes Intravenous 3 times per day 03/31/13 1537 04/02/13 1036   03/31/13 1000  vancomycin (VANCOCIN) IVPB 1000 mg/200 mL premix  Status:  Discontinued     1,000 mg 200 mL/hr over 60 Minutes Intravenous Every 12 hours 03/30/13 2144 04/01/13 0938   03/30/13 2300  cefTAZidime (FORTAZ) 2 g in dextrose 5 % 50 mL IVPB  Status:  Discontinued     2 g 100 mL/hr over 30 Minutes Intravenous 3 times per day 03/30/13 2132 03/31/13 1513   03/30/13 2200  ceFAZolin (ANCEF) IVPB 1 g/50 mL premix  Status:  Discontinued     1 g 100 mL/hr over 30 Minutes Intravenous Every 8 hours 03/30/13 1723 03/30/13 2112   03/30/13 2100  vancomycin (VANCOCIN) IVPB 1000 mg/200 mL premix  Status:  Discontinued     1,000 mg 200 mL/hr over 60 Minutes Intravenous Every 12 hours 03/30/13 1723 03/30/13 2144   03/30/13 1641  vancomycin (VANCOCIN) powder  Status:  Discontinued       As needed 03/30/13 1649 03/30/13 1703   03/29/13 1430  ceFAZolin (ANCEF) IVPB 1 g/50 mL premix     1 g 100 mL/hr over 30 Minutes Intravenous  Once 03/29/13 1417 03/29/13 1606      Medications: Scheduled Meds: . antiseptic oral rinse  15 mL Mouth Rinse BID  . cefTRIAXone (ROCEPHIN)  IV  2 g Intravenous Q24H  . docusate sodium  100 mg Oral BID  . feeding supplement  30 mL Oral BID WC  . feeding supplement  1 Container Oral TID BM  . furosemide  40 mg Intravenous Once  . insulin aspart  0-5 Units Subcutaneous QHS  . insulin aspart  0-9 Units Subcutaneous  TID WC  . insulin aspart  3 Units Subcutaneous TID WC  . potassium chloride  20 mEq Oral BID   Continuous Infusions:   PRN Meds:.acetaminophen, menthol-cetylpyridinium, methocarbamol (ROBAXIN) IV, methocarbamol, ondansetron (ZOFRAN) IV, oxyCODONE, phenol, sodium chloride, zolpidem   Objective: Weight change:   Intake/Output Summary (Last 24 hours) at 04/05/13 1434 Last data filed at 04/05/13 1245  Gross per 24 hour  Intake  122.5 ml  Output   1125 ml  Net -1002.5 ml   Blood pressure 121/49, pulse 84, temperature 98.1 F (36.7 C), temperature source Oral, resp. rate 20, height 5\' 8"  (1.727 m), weight 204 lb 12.9 oz (92.9 kg), SpO2 95.00%. Temp:  [97.5 F (36.4 C)-98.5 F (36.9 C)] 98.1 F (36.7 C) (08/19 1400) Pulse Rate:  [81-91] 84 (08/19 1400) Resp:  [20-22] 20 (08/19 1400) BP: (114-136)/(49-67) 121/49 mmHg (08/19 1400) SpO2:  [95 %-97 %] 95 % (08/19 4010)  Physical Exam: General: Alert and awake, oriented to person CVS tachy ate, normal r, no murmur rubs or gallops  Chest: fairly clear anteriorly Abdomen: soft nontender, nondistended, normal bowel sounds,  Skin: no rashes dressing in place  Neuro: nonfocal,    Lab Results:  Recent Labs  04/04/13 0920 04/05/13 0608  WBC 10.7* 6.9  HGB 7.2* 6.2*  HCT 20.6* 18.6*  PLT 68* 73*    BMET  Recent Labs  04/04/13 0920 04/05/13 0608  NA 140 143  K 3.6 3.1*  CL 109 110  CO2 22 25  GLUCOSE 127* 127*  BUN 25* 26*  CREATININE 0.84 0.76  CALCIUM 9.3 9.1    Micro Results: Recent Results (from the past 240 hour(s))  ANAEROBIC CULTURE     Status: None   Collection Time    03/30/13  3:50 PM      Result Value Range Status   Specimen Description WOUND BACK   Final   Special Requests NONE   Final   Gram Stain     Final   Value: MODERATE WBC PRESENT,BOTH PMN AND MONONUCLEAR     NO SQUAMOUS EPITHELIAL CELLS SEEN     NO ORGANISMS SEEN     Performed at [facility]     Performed at [facility]     Culture     Final   Value: NO ANAEROBES ISOLATED     Performed at [facility]   Report Status 04/04/2013 FINAL   Final  WOUND CULTURE     Status: None   Collection Time    03/30/13  3:50 PM      Result Value Range Status   Specimen Description WOUND BACK   Final   Special Requests NONE   Final   Gram Stain     Final   Value: MODERATE WBC PRESENT,BOTH PMN AND MONONUCLEAR     NO SQUAMOUS EPITHELIAL CELLS SEEN     NO ORGANISMS SEEN     Performed at [facility]     Performed at [facility]   Culture     Final   Value: FEW SERRATIA MARCESCENS     Performed at [facility]   Report Status 04/02/2013 FINAL   Final   Organism ID, Bacteria SERRATIA MARCESCENS   Final  GRAM STAIN     Status: None   Collection Time    03/30/13  3:50 PM      Result Value Range Status   Specimen Description WOUND BACK   Final   Special Requests NONE   Final   Gram Stain     Final   Value: MODERATE WBC PRESENT,BOTH PMN AND MONONUCLEAR     NO ORGANISMS SEEN   Report Status 03/30/2013 FINAL   Final  MRSA PCR SCREENING     Status: None   Collection Time    03/30/13 10:34 PM      Result Value Range Status   MRSA by PCR NEGATIVE  NEGATIVE Final   Comment:            The GeneXpert MRSA Assay (FDA     approved for NASAL specimens     only), is one component of a     comprehensive MRSA colonization     surveillance program. It is not     intended to diagnose MRSA     infection nor to guide or     monitor treatment for     MRSA infections.  URINE CULTURE     Status: None   Collection Time    03/31/13 11:17 PM      Result Value Range Status   Specimen Description URINE, CATHETERIZED   Final   Special  Requests CX ADDED AT [id number]   Final   Culture  Setup Time     Final   Value: 04/01/2013 00:58     Performed at [facility]   Colony Count     Final   Value: NO GROWTH     Performed at [facility]   Culture     Final   Value: NO GROWTH      Performed at [facility]   Report Status 04/02/2013 FINAL   Final  CULTURE, BLOOD (ROUTINE X 2)     Status: None   Collection Time    04/04/13  9:20 AM      Result Value Range Status   Specimen Description BLOOD RIGHT ANTECUBITAL   Final   Special Requests BOTTLES DRAWN AEROBIC ONLY 10CC   Final   Culture  Setup Time     Final   Value: 04/04/2013 16:48     Performed at [facility]   Culture     Final   Value:        BLOOD CULTURE RECEIVED NO GROWTH TO DATE CULTURE WILL BE HELD FOR 5 DAYS BEFORE ISSUING A FINAL NEGATIVE REPORT     Performed at [facility]   Report Status PENDING   Incomplete  CULTURE, BLOOD (ROUTINE X 2)     Status: None   Collection Time    04/04/13  9:50 AM      Result Value Range Status   Specimen Description BLOOD HAND RIGHT   Final   Special Requests BOTTLES DRAWN AEROBIC ONLY 10CC   Final   Culture  Setup Time     Final   Value: 04/04/2013 16:48     Performed at [facility]   Culture     Final   Value:        BLOOD CULTURE RECEIVED NO GROWTH TO DATE CULTURE WILL BE HELD FOR 5 DAYS BEFORE ISSUING A FINAL NEGATIVE REPORT     Performed at [facility]   Report Status PENDING   Incomplete    Studies/Results: Dg Chest Port 1 View  04/04/2013   *RADIOLOGY REPORT*  Clinical Data: Shortness of breath  PORTABLE CHEST - 1 VIEW  Comparison: 04/02/2013  Findings: The cardiac shadow is stable.  Postsurgical changes are again noted.  Increased density is again seen in the bases bilaterally and not significantly changed.  No new focal abnormality is seen.  IMPRESSION: Persistent bibasilar atelectasis.   Original Report Authenticated By: Alcide Clever, M.D.      Assessment/Plan: [name] [name] is a 79 y.o. 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  I will sign off for now  Please call with further questions.    LOS: 7 days   Acey Lav 04/05/2013, 2:34 PM

## 2013-04-05 NOTE — Progress Notes (Signed)
Speech Language Pathology Dysphagia Treatment Patient Details Name: [name] [name] MRN: [id number] DOB: [birth year] Today's Date: 04/05/2013 Time: 1040-1100 SLP Time Calculation (min): 20 min  Assessment / Plan / Recommendation Clinical Impression  Pt. looks much better today with improved respiratory status/no observeable increased work of breathing.  Lips dry and significant xerostomia therefore thorough oral care provided.  He needed encouragement to take po's.  Oral discoordination with straw and extra time needed to form seal.  No cough, throat clear or wet vocal quality exhibited.  CXR yesterday appears improved from 8/16.  Continue Dys 2 diet texture and thin liquids.  ST will continue to follow for safety and ability to upgrade texture when appropriate.    Diet Recommendation  Continue with Current Diet: Dysphagia 2 (fine chop);Thin liquid    SLP Plan Continue with current plan of care   Pertinent Vitals/Pain none   Swallowing Goals  SLP Swallowing Goals Patient will consume recommended diet without observed clinical signs of aspiration with: Total assistance Swallow Study Goal #1 - Progress: Progressing toward goal Patient will utilize recommended strategies during swallow to increase swallowing safety with: Total assistance Swallow Study Goal #2 - Progress: Progressing toward goal  General Temperature Spikes Noted: No Respiratory Status: Supplemental O2 delivered via (comment) Behavior/Cognition: Alert;Cooperative;Pleasant mood Oral Cavity - Dentition: Dentures, top;Dentures, bottom Patient Positioning: Upright in bed  Oral Cavity - Oral Hygiene Does patient have any of the following "at risk" factors?: Lips - dry, cracked;Saliva - thick, dry mouth;Nutritional status - inadequate;Oxygen therapy - cannula, mask, simple oxygen devices Brush patient's teeth BID with toothbrush (using toothpaste with fluoride): Yes Patient is HIGH RISK - Oral Care Protocol followed (see row  info): Yes   Dysphagia Treatment Treatment focused on: Skilled observation of diet tolerance;Facilitation of pharyngeal phase;Facilitation of oral phase Treatment Methods/Modalities: Skilled observation Patient observed directly with PO's: Yes Type of PO's observed: Dysphagia 2 (chopped);Thin liquids Feeding: Able to feed self;Needs assist;Needs set up Liquids provided via: Cup;Straw Oral Phase Signs & Symptoms:  (effortful seal around straw) Pharyngeal Phase Signs & Symptoms: Suspected delayed swallow initiation Type of cueing: Verbal Amount of cueing: Minimal   GO     Royce Macadamia M.Ed [employer] [phone]  04/05/2013

## 2013-04-05 NOTE — Progress Notes (Signed)
    Subjective: Procedure(s) (LRB): HARDWARE REMOVAL/IRRIGATION & DEBRIDEMENT OF WOUND ON BACK (N/A) 6 Days Post-Op  Patient reports pain as 2 on 0-10 scale.  Reports decreased leg pain reports incisional back pain   Positive void Negative bowel movement Positive flatus Negative chest pain or shortness of breath  Objective: Vital signs in last 24 hours: Temp:  [97.5 F (36.4 C)-98.5 F (36.9 C)] 98.1 F (36.7 C) (08/19 1300) Pulse Rate:  [81-92] 83 (08/19 1300) Resp:  [20-24] 20 (08/19 1300) BP: (114-148)/(54-78) 121/54 mmHg (08/19 1300) SpO2:  [94 %-97 %] 95 % (08/19 0608)  Intake/Output from previous day: 08/18 0701 - 08/19 0700 In: 110 [P.O.:60; IV Piggyback:50] Out: 1575 [Urine:1450; Drains:125]  Labs:  Recent Labs  04/04/13 0920 04/05/13 0608  WBC 10.7* 6.9  RBC 2.32* 2.08*  HCT 20.6* 18.6*  PLT 68* 73*    Recent Labs  04/04/13 0920 04/05/13 0608  NA 140 143  K 3.6 3.1*  CL 109 110  CO2 22 25  BUN 25* 26*  CREATININE 0.84 0.76  GLUCOSE 127* 127*  CALCIUM 9.3 9.1   No results found for this basename: LABPT, INR,  in the last 72 hours  Physical Exam: Neurologically intact ABD soft Intact pulses distally Incision: dressing C/D/I and no drainage Compartment soft  Assessment/Plan: Patient stable  xrays N/A Continue mobilization with physical therapy Continue care  Patient improving.  Mental status satisfactory Improved O2 sats. D/C vac and monitor surgical wound Once medically stable will transfer to SNF.     Venita Lick, MD [facility] [phone]

## 2013-04-05 NOTE — Progress Notes (Signed)
PT Cancellation Note  Patient Details Name: [name] [name] MRN: [id number] DOB: January 20, 1934   Cancelled Treatment:    Reason Eval/Treat Not Completed: Medical issues which prohibited therapy.  Per RN pt's Hgb 6.1 and still with low O2 sats.  Will hold PT at this time and try back another time.     Sunny Schlein, [place] [phone] 04/05/2013, 9:07 AM

## 2013-04-05 NOTE — Care Management Note (Signed)
    Page 1 of 2   04/07/2013     3:07:45 PM   CARE MANAGEMENT NOTE 04/07/2013  Patient:  [name] [name],[name] [name]   Account Number:  [id number]  Date Initiated:  03/31/2013  Documentation initiated by:  Donn Pierini  Subjective/Objective Assessment:   Pt admitted s/p lumbar Hardware failure and wound infection- I&D and revision     Action/Plan:   PTA pt was at [facility] [place]- with [facility] following for HH-RN,PT/OT--will have PT/OT evals and follow for recommendations HH vs ST-SNF   Anticipated DC Date:  04/07/2013   Anticipated DC Plan:  SKILLED NURSING FACILITY  In-house referral  Clinical Social Worker      DC Planning Services  CM consult      [facility] Choice  Resumption Of Svcs/PTA Provider   Choice offered to / List presented to:             [facility] agency  [facility]   Status of service:  Completed, signed off Medicare Important Message given?   (If response is "NO", the following Medicare IM given date fields will be blank) Date Medicare IM given:   Date Additional Medicare IM given:    Discharge Disposition:  SKILLED NURSING FACILITY  Per UR Regulation:  Reviewed for med. necessity/level of care/duration of stay  If discussed at Long Length of Stay Meetings, dates discussed:   04/07/2013    Comments:  04/07/13 15:06 Letha Cape RN, BSN [phone] patient for dc to [facility] today CSW following.  04/05/13 15:04 Letha Cape RN, BSN [phone] Wound vac dc'd. Patient wants to go to [facility] at dc. CSW following. Patient had problems with respiratory issues, was put on venti mask of 6 liters.  8/15 1111a debbie dowell rn,bsn pt has vac. may need snf. vac form placed in shadow chart in case needed.

## 2013-04-05 NOTE — Progress Notes (Signed)
Telephonic order from Dr. Shon Baton to d/c wound vac. Place foam dressing on wound. Wound nurse applied foam to incision on back.   Peter Congo RN

## 2013-04-05 NOTE — Clinical Social Work Note (Signed)
CSW met with daughter and discussed the recommendation for SNF at length. Daughter Alona Bene is overwhelmed with the responsibilities of caring for her mother and determining a SNF placement for her father. CSW provided emotional support to patient and has assessed her needs at this time. Daughter claims that SNF search process was not explained by previous CSW. 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 CSW answered daughter's questions about SNF process and questions about insurance. CSW will follow up with daughter with resources.   Roddie Mc, [place], [place], [phone]

## 2013-04-06 ENCOUNTER — Encounter (HOSPITAL_COMMUNITY): Payer: Self-pay | Admitting: General Practice

## 2013-04-06 DIAGNOSIS — E876 Hypokalemia: Secondary | ICD-10-CM

## 2013-04-06 DIAGNOSIS — D649 Anemia, unspecified: Secondary | ICD-10-CM

## 2013-04-06 LAB — CBC
HCT: 25.1 % — ABNORMAL LOW (ref 39.0–52.0)
MCV: 88.1 fL (ref 78.0–100.0)
Platelets: 101 10*3/uL — ABNORMAL LOW (ref 150–400)
RBC: 2.85 MIL/uL — ABNORMAL LOW (ref 4.22–5.81)
WBC: 7.7 10*3/uL (ref 4.0–10.5)

## 2013-04-06 LAB — TYPE AND SCREEN: Unit division: 0

## 2013-04-06 LAB — BASIC METABOLIC PANEL
CO2: 27 mEq/L (ref 19–32)
Chloride: 111 mEq/L (ref 96–112)
Potassium: 2.8 mEq/L — ABNORMAL LOW (ref 3.5–5.1)
Sodium: 147 mEq/L — ABNORMAL HIGH (ref 135–145)

## 2013-04-06 LAB — GLUCOSE, CAPILLARY
Glucose-Capillary: 204 mg/dL — ABNORMAL HIGH (ref 70–99)
Glucose-Capillary: 242 mg/dL — ABNORMAL HIGH (ref 70–99)

## 2013-04-06 MED ORDER — FUROSEMIDE 10 MG/ML IJ SOLN
40.0000 mg | Freq: Once | INTRAMUSCULAR | Status: AC
  Administered 2013-04-06: 40 mg via INTRAVENOUS
  Filled 2013-04-06: qty 4

## 2013-04-06 MED ORDER — TRAMADOL HCL 50 MG PO TABS: 50.0000 mg | ORAL_TABLET | Freq: Four times a day (QID) | ORAL | Status: DC | PRN

## 2013-04-06 MED ORDER — GLIPIZIDE 2.5 MG HALF TABLET
2.5000 mg | ORAL_TABLET | Freq: Every day | ORAL | Status: DC
  Administered 2013-04-07: 2.5 mg via ORAL
  Filled 2013-04-06 (×2): qty 1

## 2013-04-06 MED ORDER — POTASSIUM CHLORIDE CRYS ER 20 MEQ PO TBCR
40.0000 meq | EXTENDED_RELEASE_TABLET | ORAL | Status: AC
  Administered 2013-04-06 (×3): 40 meq via ORAL
  Filled 2013-04-06 (×2): qty 2

## 2013-04-06 NOTE — Progress Notes (Signed)
[facility] PROGRESS NOTE [name] [name] YNW:[id number] DOB: February 26, 1934 DOA: 03/29/2013 PCP: Provider Not In System  Assessment/Plan:  If hgb stable and potassium improved tomorrow, will be stable for discharge  Hardware-Associated T-L Osteomyelitis -Cx with Serratia. -Continue ceftriaxone. -Per ID will need 6-8 weeks of IV antibiotics, followed possibly by a protracted course of oral antibiotics. -Wound vac removed 8/19  Acute Hypoxemic Respiratory Failure -restarted on oxygen 8/18.   -CXR appears to show some pulm vasc congestion.  8/18.  Received IV lasix with improvement. -CT angio without PE. 8/15 HGB up to > 8.  Will give another dose lasix  Anemia improved.  Hypokaleima: replete by mouth  DM Resume glipizide  Thrombocytopenia improving  Code Status: Full Family Communication: Patient only.  Disposition Plan: SNF   Antibiotics:  Rocephin   Subjective: Feeling better today, making jokes.  States repeatedly that he wants to go home.    Objective: Filed Vitals:   04/05/13 1904 04/05/13 2159 04/06/13 0500 04/06/13 1440  BP: 133/49 137/59 118/51 122/60  Pulse: 92 85 61 72  Temp: 99 F (37.2 C) 98 F (36.7 C) 97.6 F (36.4 C) 97.9 F (36.6 C)  TempSrc: Oral Oral Oral Oral  Resp: 20 18 20 18   Height:      Weight:      SpO2:  96% 98% 97%    Intake/Output Summary (Last 24 hours) at 04/06/13 2126 Last data filed at 04/06/13 1817  Gross per 24 hour  Intake     60 ml  Output    800 ml  Net   -740 ml   Filed Weights   03/30/13 1946 04/01/13 2004  Weight: 86.7 kg (191 lb 2.2 oz) 92.9 kg (204 lb 12.9 oz)    Exam:   General:  Asleep. Arousable. comfortable  Cardiovascular: RRR, no m/r/g  Respiratory: CTA B,   Abdomen: S/NT/+BS/no masses  Extremities: 1+ edeam  Neurologic:  Non-focal  Data Reviewed: Basic Metabolic Panel:  Recent Labs Lab 04/02/13 0755 04/03/13 0535 04/04/13 0920 04/05/13 0608 04/06/13 0550  NA 138  138 140 143 147*  K 3.8 3.3* 3.6 3.1* 2.8*  CL 106 107 109 110 111  CO2 21 21 22 25 27   GLUCOSE 128* 137* 127* 127* 157*  BUN 38* 33* 25* 26* 26*  CREATININE 0.92 0.76 0.84 0.76 0.66  CALCIUM 8.7 8.9 9.3 9.1 8.5   Liver Function Tests:  Recent Labs Lab 04/01/13 1100 04/02/13 0755  AST 27 25  ALT 13 12  ALKPHOS 62 59  BILITOT 2.7* 2.4*  PROT 4.4* 4.1*  ALBUMIN 1.5* 1.4*   CBC:  Recent Labs Lab 03/31/13 2200 04/01/13 1100 04/02/13 0755 04/03/13 0535 04/04/13 0920 04/05/13 0608 04/06/13 0550  WBC 9.4 12.6* 9.9 9.7 10.7* 6.9 7.7  NEUTROABS  --  11.8* 9.0*  --   --   --   --   HGB 7.6* 7.4* 7.1* 7.1* 7.2* 6.2* 8.5*  HCT 21.5* 21.0* 21.3* 20.1* 20.6* 18.6* 25.1*  MCV 88.8 86.1 85.5 87.0 88.8 89.4 88.1  PLT 41* 58* 46* 62* 68* 73* 101*   Cardiac Enzymes:  Recent Labs Lab 03/31/13 1758 04/01/13 0030 04/01/13 1100 04/04/13 0941  TROPONINI <0.30 <0.30 <0.30 <0.30  CBG:  Recent Labs Lab 04/05/13 1745 04/05/13 2205 04/06/13 0741 04/06/13 1215 04/06/13 1715  GLUCAP 175* 139* 138* 204* 242*    Recent Results (from the past 240 hour(s))  ANAEROBIC CULTURE     Status: None   Collection Time  03/30/13  3:50 PM      Result Value Range Status   Specimen Description WOUND BACK   Final   Special Requests NONE   Final   Gram Stain     Final   Value: MODERATE WBC PRESENT,BOTH PMN AND MONONUCLEAR     NO SQUAMOUS EPITHELIAL CELLS SEEN     NO ORGANISMS SEEN     Performed at [facility]     Performed at [facility]   Culture     Final   Value: NO ANAEROBES ISOLATED     Performed at [facility]   Report Status 04/04/2013 FINAL   Final  WOUND CULTURE     Status: None   Collection Time    03/30/13  3:50 PM      Result Value Range Status   Specimen Description WOUND BACK   Final   Special Requests NONE   Final   Gram Stain     Final   Value: MODERATE WBC PRESENT,BOTH PMN AND MONONUCLEAR     NO SQUAMOUS EPITHELIAL CELLS SEEN     NO  ORGANISMS SEEN     Performed at [facility]     Performed at [facility]   Culture     Final   Value: FEW SERRATIA MARCESCENS     Performed at [facility]   Report Status 04/02/2013 FINAL   Final   Organism ID, Bacteria SERRATIA MARCESCENS   Final  GRAM STAIN     Status: None   Collection Time    03/30/13  3:50 PM      Result Value Range Status   Specimen Description WOUND BACK   Final   Special Requests NONE   Final   Gram Stain     Final   Value: MODERATE WBC PRESENT,BOTH PMN AND MONONUCLEAR     NO ORGANISMS SEEN   Report Status 03/30/2013 FINAL   Final  MRSA PCR SCREENING     Status: None   Collection Time    03/30/13 10:34 PM      Result Value Range Status   MRSA by PCR NEGATIVE  NEGATIVE Final   Comment:            The GeneXpert MRSA Assay (FDA     approved for NASAL specimens     only), is one component of a     comprehensive MRSA colonization     surveillance program. It is not     intended to diagnose MRSA     infection nor to guide or     monitor treatment for     MRSA infections.  URINE CULTURE     Status: None   Collection Time    03/31/13 11:17 PM      Result Value Range Status   Specimen Description URINE, CATHETERIZED   Final   Special Requests CX ADDED AT [id number]   Final   Culture  Setup Time     Final   Value: 04/01/2013 00:58     Performed at [facility]   Colony Count     Final   Value: NO GROWTH     Performed at [facility]   Culture     Final   Value: NO GROWTH     Performed at [facility]   Report Status 04/02/2013 FINAL   Final  CULTURE, BLOOD (ROUTINE X 2)     Status: None   Collection Time  04/04/13  9:20 AM      Result Value Range Status   Specimen Description BLOOD RIGHT ANTECUBITAL   Final   Special Requests BOTTLES DRAWN AEROBIC ONLY 10CC   Final   Culture  Setup Time     Final   Value: 04/04/2013 16:48     Performed at [facility]   Culture     Final   Value:         BLOOD CULTURE RECEIVED NO GROWTH TO DATE CULTURE WILL BE HELD FOR 5 DAYS BEFORE ISSUING A FINAL NEGATIVE REPORT     Performed at [facility]   Report Status PENDING   Incomplete  CULTURE, BLOOD (ROUTINE X 2)     Status: None   Collection Time    04/04/13  9:50 AM      Result Value Range Status   Specimen Description BLOOD HAND RIGHT   Final   Special Requests BOTTLES DRAWN AEROBIC ONLY 10CC   Final   Culture  Setup Time     Final   Value: 04/04/2013 16:48     Performed at [facility]   Culture     Final   Value:        BLOOD CULTURE RECEIVED NO GROWTH TO DATE CULTURE WILL BE HELD FOR 5 DAYS BEFORE ISSUING A FINAL NEGATIVE REPORT     Performed at [facility]   Report Status PENDING   Incomplete     Studies: No results found.  Scheduled Meds: . antiseptic oral rinse  15 mL Mouth Rinse BID  . cefTRIAXone (ROCEPHIN)  IV  2 g Intravenous Q24H  . docusate sodium  100 mg Oral BID  . feeding supplement  30 mL Oral BID WC  . feeding supplement  1 Container Oral TID BM  . furosemide  40 mg Intravenous Once  . [START ON 04/07/2013] glipiZIDE  2.5 mg Oral Q breakfast  . insulin aspart  0-9 Units Subcutaneous TID WC   Continuous Infusions:  Christiane Ha, MD [facility] Pager [phone]  If 7PM-7AM, please contact night-coverage at [email or web address], password [facility] 04/06/2013, 9:26 PM  LOS: 8 days

## 2013-04-06 NOTE — Clinical Social Work Placement (Signed)
CSW attempted to contact daughter Alona Bene by phone to notify her that adult caregiver resources have been left in patient's room. Daughter also notified of different CSW covering for Thursday 04/07/13. Message was left. CSW will continue to follow.  Roddie Mc, [place], [place], [phone]
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
## 2013-04-06 NOTE — Progress Notes (Addendum)
Subjective: Patient comfortable.     Objective: Vital signs in last 24 hours: Temp:  [97.6 F (36.4 C)-99 F (37.2 C)] 97.6 F (36.4 C) (08/20 0500) Pulse Rate:  [61-92] 61 (08/20 0500) Resp:  [18-20] 20 (08/20 0500) BP: (114-137)/(49-63) 118/51 mmHg (08/20 0500) SpO2:  [96 %-98 %] 98 % (08/20 0500)  Intake/Output from previous day: 08/19 0701 - 08/20 0700 In: 739.6 [P.O.:60; Blood:579.6; IV Piggyback:100] Out: 3100 [Urine:3100] Intake/Output this shift:     Recent Labs  04/04/13 0920 04/05/13 0608 04/06/13 0550  HGB 7.2* 6.2* 8.5*    Recent Labs  04/05/13 0608 04/06/13 0550  WBC 6.9 7.7  RBC 2.08* 2.85*  HCT 18.6* 25.1*  PLT 73* 101*    Recent Labs  04/05/13 0608 04/06/13 0550  NA 143 147*  K 3.1* 2.8*  CL 110 111  CO2 25 27  BUN 26* 26*  CREATININE 0.76 0.66  GLUCOSE 127* 157*  CALCIUM 9.1 8.5   No results found for this basename: LABPT, INR,  in the last 72 hours   Neurovascular intact Dorsiflexion/Plantar flexion intact Back wound looks good.  Sutures intact. No drainage or gross signs of infection.   Assessment/Plan: Continue present care. Transfer to snf when cleared by medical service.    OWENS,JAMES M 04/06/2013, 11:43 AM    Agree with above HCT/PLT count improved (25.1/101)  s/p transfusion for acute blood loss anemia secondary to surgery and sepsis Oral intake improving Wound healing - no signs of active infection/drainage ID signed off - recommendations noted SW/CM to begin placement for SNF
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
## 2013-04-07 DIAGNOSIS — E43 Unspecified severe protein-calorie malnutrition: Secondary | ICD-10-CM

## 2013-04-07 LAB — CBC
HCT: 22.6 % — ABNORMAL LOW (ref 39.0–52.0)
MCH: 30.6 pg (ref 26.0–34.0)
MCHC: 34.5 g/dL (ref 30.0–36.0)
RDW: 17.3 % — ABNORMAL HIGH (ref 11.5–15.5)

## 2013-04-07 LAB — BASIC METABOLIC PANEL
BUN: 28 mg/dL — ABNORMAL HIGH (ref 6–23)
CO2: 28 mEq/L (ref 19–32)
Calcium: 8.8 mg/dL (ref 8.4–10.5)
Creatinine, Ser: 0.73 mg/dL (ref 0.50–1.35)

## 2013-04-07 LAB — MAGNESIUM: Magnesium: 1.7 mg/dL (ref 1.5–2.5)

## 2013-04-07 MED ORDER — POTASSIUM CHLORIDE CRYS ER 20 MEQ PO TBCR
40.0000 meq | EXTENDED_RELEASE_TABLET | ORAL | Status: AC
  Administered 2013-04-07 (×2): 40 meq via ORAL
  Filled 2013-04-07 (×2): qty 2

## 2013-04-07 MED ORDER — DEXTROSE 5 % IV SOLN: 2.0000 g | INTRAVENOUS | Status: DC

## 2013-04-07 MED ORDER — HEPARIN SOD (PORK) LOCK FLUSH 100 UNIT/ML IV SOLN
250.0000 [IU] | INTRAVENOUS | Status: AC | PRN
  Administered 2013-04-07: 250 [IU]

## 2013-04-07 NOTE — Discharge Summary (Signed)
Physician Discharge Summary  Patient ID: [name] [name] MRN: [id number] DOB/AGE: Mar 30, 1934 79 y.o.  Admit date: 03/29/2013 Discharge date: 04/07/2013  Admission Diagnoses:  Discharge Diagnoses:  Active Problems:   CAD (coronary artery disease)   DM2 (diabetes mellitus, type 2)   HTN (hypertension)   Osteomyelitis of spine   Protein-calorie malnutrition, severe   Acute respiratory failure   Hypokalemia   Anemia   Discharged Condition: stable  Hospital Course: 79 yo wm was taken to the OR by Dr Shon Baton 30 Mar 2013 for hardware removal wound debridement for postop infection.  Previous surgery a few weeks ago for multilevel thoracolumbar fusion.  Tolerated surgery well.  CCM, ID and medicine service asked to follow patient due to multiple medical issues.  Wound vac was applied at the time of procedure and removed Tuesday.  Would has been healing well.  Minimal serosang drainage.  No gross signs of infection.  He has been NVI.  Progressing with therapy.  Currently on IV rocephin and ID recommending at least 6 more weeks.  Patient is doing well and ready for transfer to SNF today.    Consults: pulmonary/intensive care and ID , hosptitalist   Discharge Exam: Blood pressure 137/74, pulse 65, temperature 97.6 F (36.4 C), temperature source Oral, resp. rate 18, height 5\' 8"  (1.727 m), weight 92.9 kg (204 lb 12.9 oz), SpO2 97.00%. Incision/Wound: back wound looks good.  Sutures intact.  Minimal serosang drainage.  No gross signs of infection.  bilat calves nontender.  Neurovascularly intact.    Disposition: snf  Discharge Orders   Future Appointments Provider Department Dept Phone   05/10/2013 2:00 PM Randall Hiss, MD [facility] [phone]   Future Orders Complete By Expires   Call MD / Call 911  As directed    Comments:     If you experience chest pain or shortness of breath, CALL 911 and be transported to the hospital emergency room.  If  you develope a fever above 101 F, pus (white drainage) or increased drainage or redness at the wound, or calf pain, call your surgeon's office.   Constipation Prevention  As directed    Comments:     Drink plenty of fluids.  Prune juice may be helpful.  You may use a stool softener, such as Colace (over the counter) 100 mg twice a day.  Use MiraLax (over the counter) for constipation as needed.   Diet - low sodium heart healthy  As directed    Discharge diet:  As directed    Scheduling Instructions:     Dysphagia 2   Discharge instructions  As directed    Scheduling Instructions:     Check CBC and BMET in one week   Discharge instructions  As directed    Comments:     Daily dressing changes for wound with mepilex.  Ok to shower but no tub soaking.   Increase activity slowly as tolerated  As directed    Scheduling Instructions:     Continue working with Physical Therapy per hospital instructions.   Lifting restrictions  As directed    Comments:     No lifting       Medication List    STOP taking these medications       fish oil-omega-3 fatty acids 1000 MG capsule     glucosamine-chondroitin 500-400 MG tablet      TAKE these medications       brimonidine 0.2 % ophthalmic  solution  Commonly known as:  ALPHAGAN  Place 1 drop into both eyes 2 (two) times daily.     calcium-vitamin D 250-125 MG-UNIT per tablet  Commonly known as:  OSCAL WITH D  Take 2 tablets by mouth 2 (two) times daily.     cholecalciferol 1000 UNITS tablet  Commonly known as:  VITAMIN D  Take 1,000 Units by mouth daily.     CoQ-10 100 MG Caps  Take 200 mg by mouth daily.     dextrose 5 % SOLN 50 mL with cefTRIAXone 2 G SOLR 2 g  Inject 2 g into the vein daily.     docusate sodium 100 MG capsule  Commonly known as:  COLACE  Take 1 capsule (100 mg total) by mouth 3 (three) times daily as needed for constipation.     glipiZIDE 5 MG tablet  Commonly known as:  GLUCOTROL  Take 2.5 mg by mouth daily.      HYDROcodone-acetaminophen 5-325 MG per tablet  Commonly known as:  NORCO/VICODIN  Take 1 tablet by mouth every 6 (six) hours as needed for pain.     losartan 100 MG tablet  Commonly known as:  COZAAR  Take 100 mg by mouth daily.     metFORMIN 1000 MG tablet  Commonly known as:  GLUCOPHAGE  Take 1,000 mg by mouth daily.     methocarbamol 500 MG tablet  Commonly known as:  ROBAXIN  Take 1 tablet (500 mg total) by mouth 3 (three) times daily as needed.     multivitamin with minerals tablet  Take 1 tablet by mouth daily.     ondansetron 4 MG tablet  Commonly known as:  ZOFRAN  Take 1 tablet (4 mg total) by mouth every 8 (eight) hours as needed for nausea.     polyethylene glycol powder powder  Commonly known as:  GLYCOLAX  Take 17 g by mouth daily.     simvastatin 20 MG tablet  Commonly known as:  ZOCOR  Take 10 mg by mouth every evening.     tamsulosin 0.4 MG Caps capsule  Commonly known as:  FLOMAX  Take 0.4 mg by mouth daily.     vitamin B-6 500 MG tablet  Take 500 mg by mouth daily.           Follow-up Information   Schedule an appointment as soon as possible for a visit with Alvy Beal, MD. (follow up in 2 weeks)    Specialty:  Orthopedic Surgery   Contact information:   [address] [place] [address] [phone]       Signed: Naida Sleight 04/07/2013, 1:14 PM

## 2013-04-07 NOTE — Progress Notes (Signed)
[name] to be D/C'd Skilled nursing facility per MD order.  Discussed with the patient and all questions fully answered.    Medication List    STOP taking these medications       fish oil-omega-3 fatty acids 1000 MG capsule     glucosamine-chondroitin 500-400 MG tablet      TAKE these medications       brimonidine 0.2 % ophthalmic solution  Commonly known as:  ALPHAGAN  Place 1 drop into both eyes 2 (two) times daily.     calcium-vitamin D 250-125 MG-UNIT per tablet  Commonly known as:  OSCAL WITH D  Take 2 tablets by mouth 2 (two) times daily.     cholecalciferol 1000 UNITS tablet  Commonly known as:  VITAMIN D  Take 1,000 Units by mouth daily.     CoQ-10 100 MG Caps  Take 200 mg by mouth daily.     dextrose 5 % SOLN 50 mL with cefTRIAXone 2 G SOLR 2 g  Inject 2 g into the vein daily.     docusate sodium 100 MG capsule  Commonly known as:  COLACE  Take 1 capsule (100 mg total) by mouth 3 (three) times daily as needed for constipation.     glipiZIDE 5 MG tablet  Commonly known as:  GLUCOTROL  Take 2.5 mg by mouth daily.     HYDROcodone-acetaminophen 5-325 MG per tablet  Commonly known as:  NORCO/VICODIN  Take 1 tablet by mouth every 6 (six) hours as needed for pain.     losartan 100 MG tablet  Commonly known as:  COZAAR  Take 100 mg by mouth daily.     metFORMIN 1000 MG tablet  Commonly known as:  GLUCOPHAGE  Take 1,000 mg by mouth daily.     methocarbamol 500 MG tablet  Commonly known as:  ROBAXIN  Take 1 tablet (500 mg total) by mouth 3 (three) times daily as needed.     multivitamin with minerals tablet  Take 1 tablet by mouth daily.     ondansetron 4 MG tablet  Commonly known as:  ZOFRAN  Take 1 tablet (4 mg total) by mouth every 8 (eight) hours as needed for nausea.     polyethylene glycol powder powder  Commonly known as:  GLYCOLAX  Take 17 g by mouth daily.     simvastatin 20 MG tablet  Commonly known as:  ZOCOR  Take 10 mg by mouth  every evening.     tamsulosin 0.4 MG Caps capsule  Commonly known as:  FLOMAX  Take 0.4 mg by mouth daily.     vitamin B-6 500 MG tablet  Take 500 mg by mouth daily.        Pt went to [facility] with wound vac foam and tubing on incision on middle of back. PICC line was capped by IV team.  An After Visit Summary was printed and given to PTAR to give to SNF.Gave nurse at [facility] report on patient and PTAR took pt by stretcher  Cindra Eves, RN 04/07/2013 7:00 PM

## 2013-04-07 NOTE — Progress Notes (Addendum)
    Subjective: Procedure(s) (LRB): HARDWARE REMOVAL/IRRIGATION & DEBRIDEMENT OF WOUND ON BACK (N/A) 8 Days Post-Op  Patient reports pain as 2 on 0-10 scale.  Reports decreased leg pain Minimal incisional back pain   Positive void Positive bowel movement Positive flatus Negative chest pain or shortness of breath  Objective: Vital signs in last 24 hours: Temp:  [97.6 F (36.4 C)-100.8 F (38.2 C)] 97.6 F (36.4 C) (08/21 4540) Pulse Rate:  [65-101] 65 (08/21 0652) Resp:  [18-20] 18 (08/21 0652) BP: (122-149)/(60-78) 137/74 mmHg (08/21 0652) SpO2:  [94 %-97 %] 97 % (08/21 0652)  Intake/Output from previous day: 08/20 0701 - 08/21 0700 In: -  Out: 1850 [Urine:1850]  Labs:  Recent Labs  04/06/13 0550 04/07/13 0625  WBC 7.7 6.3  RBC 2.85* 2.55*  HCT 25.1* 22.6*  PLT 101* 86*    Recent Labs  04/06/13 0550 04/07/13 0625  NA 147* 144  K 2.8* 3.0*  CL 111 110  CO2 27 28  BUN 26* 28*  CREATININE 0.66 0.73  GLUCOSE 157* 141*  CALCIUM 8.5 8.8   No results found for this basename: LABPT, INR,  in the last 72 hours  Physical Exam: Neurologically intact ABD soft Intact pulses distally Incision: dressing C/D/I and no drainage Compartment soft  Assessment/Plan: Patient stable  xrays n/a Continue mobilization with physical therapy Continue care  Advance diet Up with therapy Discharge to SNF Per medical team if labs ok then will d/c to SNF today  Venita Lick, MD [facility] [phone]  Add:  HCT dec. To 22.6  K inc to 3.   Will discuss with medical team If patient unable to d/c to SNF due to labs - ? Transfer to medical team for ongoing care.  Ortho issue are stable - wound healing.  No signs of active infection.

## 2013-04-07 NOTE — Discharge Summary (Signed)
Agree with above 

## 2013-04-07 NOTE — Consult Note (Signed)
WOC consult Note Reason for Consult: Requested by Dr Shon Baton of ortho service to re-apply vac to back wound.  Refer to previous WOC notes for previous events. Pt has post-op incision with suture line closed and well approximated.  No open wound, minimal tan drainage, no odor.  Incision line dark red and bruised. Applied Mepitel contact layer to protect skin and one piece black foam.  Bridged to axilla area for track pad to reduce pressure to site.  Intermittent suction applied to 75mm as requested with good seal.  Pt is preparing to transfer to SNF and the care manager has left a progress note that the facility can accommodate the vac upon admission. Please re-consult if further assistance is needed.  Mardee Postin MSN, RN, [place], [place], CNS [phone] W

## 2013-04-07 NOTE — Progress Notes (Signed)
Speech Language Pathology Dysphagia Treatment Patient Details Name: [name] [name] MRN: [id number] DOB: 07-Aug-1934 Today's Date: 04/07/2013 Time: 7829-5621 SLP Time Calculation (min): 8 min  Assessment / Plan / Recommendation Clinical Impression  Resting in bed without evidence of labored respirations.  Oral hygiene is much improved without mucous residue although saliva production appears decreased.  He consumed straw sips thin water without cough, throat clear or wet vocal quality, however increase in respiratory effort present.  He needed mild tactile assist to pull straw away from oral cavity to decrease intake.  Pt. did not want to donn lower partial to attempt higher food texture.  Given current decreased endurance, feel pt. should remain on Dys 2 diet while in hospital and can attempt to upgrade at next venue of care.  ST will sign off.  Please reconsult if needed.    Diet Recommendation  Continue with Current Diet: Dysphagia 2 (fine chop);Thin liquid    SLP Plan Discharge SLP treatment due to (comment) (pt. is stable for acute care )   Pertinent Vitals/Pain none   Swallowing Goals  SLP Swallowing Goals Patient will consume recommended diet without observed clinical signs of aspiration with: Total assistance Swallow Study Goal #1 - Progress: Met Patient will utilize recommended strategies during swallow to increase swallowing safety with: Total assistance Swallow Study Goal #2 - Progress: Met  General Temperature Spikes Noted: No Respiratory Status: Supplemental O2 delivered via (comment) Behavior/Cognition: Lethargic;Requires cueing Oral Cavity - Dentition:  (lower partial not in) Patient Positioning: Upright in bed  Oral Cavity - Oral Hygiene Does patient have any of the following "at risk" factors?: Oxygen therapy - cannula, mask, simple oxygen devices Brush patient's teeth BID with toothbrush (using toothpaste with fluoride): Yes Patient is HIGH RISK - Oral Care Protocol  followed (see row info): Yes   Dysphagia Treatment Treatment focused on: Skilled observation of diet tolerance Treatment Methods/Modalities: Skilled observation Patient observed directly with PO's: Yes Type of PO's observed: Thin liquids Liquids provided via: Straw Type of cueing: Verbal Amount of cueing: Minimal   GO     Royce Macadamia M.Ed [employer] [phone]  04/07/2013

## 2013-04-07 NOTE — Progress Notes (Signed)
Covering Clinical Child psychotherapist (CSW) informed that pt is medically ready for dc to [facility] and is now needing a wound vac. CSW confirmed with Janie at the facility that they will be able to accommodate wound vac and that pt daughter is actually at the facility signing pt in now. CSW prepared dc packet and contacted PTAR for a 15:30 per RN request. No additional needs, pt aware, CSW signing off.  Theresia Bough, MSW, Theresia Majors [phone]

## 2013-04-07 NOTE — Progress Notes (Signed)
[facility] PROGRESS NOTE [name] [name] JXB:[id number] DOB: [birth year] DOA: 03/29/2013 PCP: Provider Not In System  Assessment/Plan:  Potassium better, but still slightly low. Will give 2 more doses. Stable for discharge to SNF. Recommend BMET, hgb, oral intake be monitored at SNF.  May wean oxygen at SNF.  Will discuss with Dr. Shon Baton  Hardware-Associated T-L Osteomyelitis -Cx with Serratia. -Continue ceftriaxone. -Per ID will need 6-8 weeks of IV antibiotics, followed possibly by a protracted course of oral antibiotics. -Wound vac removed 8/19  Acute Hypoxemic Respiratory Failure -restarted on oxygen 8/18.   -CXR appears to show some pulm vasc congestion.  8/18.  Received IV lasix with improvement. -CT angio without PE. 8/15 Appears stable  Anemia improved.  Hypokaleima: replete by mouth  DM Resume glipizide  Thrombocytopenia improving  Code Status: Full Family Communication: Patient only.  Disposition Plan: SNF   Antibiotics:  Rocephin   Subjective: No complaints  Objective: Filed Vitals:   04/06/13 0500 04/06/13 1440 04/06/13 2207 04/07/13 0652  BP: 118/51 122/60 149/78 137/74  Pulse: 61 72 101 65  Temp: 97.6 F (36.4 C) 97.9 F (36.6 C) 100.8 F (38.2 C) 97.6 F (36.4 C)  TempSrc: Oral Oral Oral Oral  Resp: 20 18 20 18   Height:      Weight:      SpO2: 98% 97% 94% 97%    Intake/Output Summary (Last 24 hours) at 04/07/13 0810 Last data filed at 04/07/13 0408  Gross per 24 hour  Intake      0 ml  Output   1850 ml  Net  -1850 ml   Filed Weights   03/30/13 1946 04/01/13 2004  Weight: 86.7 kg (191 lb 2.2 oz) 92.9 kg (204 lb 12.9 oz)    Exam:   General:  Asleep. Arousable. comfortable  Cardiovascular: RRR, no m/r/g  Respiratory: CTA B,   Abdomen: S/NT/+BS/no masses  Extremities: 1+ edeam  Neurologic:  Non-focal  Data Reviewed: Basic Metabolic Panel:  Recent Labs Lab 04/03/13 0535 04/04/13 0920 04/05/13 0608  04/06/13 0550 04/07/13 0625  NA 138 140 143 147* 144  K 3.3* 3.6 3.1* 2.8* 3.0*  CL 107 109 110 111 110  CO2 21 22 25 27 28   GLUCOSE 137* 127* 127* 157* 141*  BUN 33* 25* 26* 26* 28*  CREATININE 0.76 0.84 0.76 0.66 0.73  CALCIUM 8.9 9.3 9.1 8.5 8.8  MG  --   --   --   --  1.7   Liver Function Tests:  Recent Labs Lab 04/01/13 1100 04/02/13 0755  AST 27 25  ALT 13 12  ALKPHOS 62 59  BILITOT 2.7* 2.4*  PROT 4.4* 4.1*  ALBUMIN 1.5* 1.4*   CBC:  Recent Labs Lab 03/31/13 2200 04/01/13 1100 04/02/13 0755 04/03/13 0535 04/04/13 0920 04/05/13 0608 04/06/13 0550 04/07/13 0625  WBC 9.4 12.6* 9.9 9.7 10.7* 6.9 7.7 6.3  NEUTROABS  --  11.8* 9.0*  --   --   --   --   --   HGB 7.6* 7.4* 7.1* 7.1* 7.2* 6.2* 8.5* 7.8*  HCT 21.5* 21.0* 21.3* 20.1* 20.6* 18.6* 25.1* 22.6*  MCV 88.8 86.1 85.5 87.0 88.8 89.4 88.1 88.6  PLT 41* 58* 46* 62* 68* 73* 101* 86*   Cardiac Enzymes:  Recent Labs Lab 03/31/13 1758 04/01/13 0030 04/01/13 1100 04/04/13 0941  TROPONINI <0.30 <0.30 <0.30 <0.30  CBG:  Recent Labs Lab 04/05/13 2205 04/06/13 0741 04/06/13 1215 04/06/13 1715 04/06/13 2201  GLUCAP 139* 138* 204*  242* 115*    Recent Results (from the past 240 hour(s))  ANAEROBIC CULTURE     Status: None   Collection Time    03/30/13  3:50 PM      Result Value Range Status   Specimen Description WOUND BACK   Final   Special Requests NONE   Final   Gram Stain     Final   Value: MODERATE WBC PRESENT,BOTH PMN AND MONONUCLEAR     NO SQUAMOUS EPITHELIAL CELLS SEEN     NO ORGANISMS SEEN     Performed at [facility]     Performed at [facility]   Culture     Final   Value: NO ANAEROBES ISOLATED     Performed at [facility]   Report Status 04/04/2013 FINAL   Final  WOUND CULTURE     Status: None   Collection Time    03/30/13  3:50 PM      Result Value Range Status   Specimen Description WOUND BACK   Final   Special Requests NONE   Final   Gram Stain      Final   Value: MODERATE WBC PRESENT,BOTH PMN AND MONONUCLEAR     NO SQUAMOUS EPITHELIAL CELLS SEEN     NO ORGANISMS SEEN     Performed at [facility]     Performed at [facility]   Culture     Final   Value: FEW SERRATIA MARCESCENS     Performed at [facility]   Report Status 04/02/2013 FINAL   Final   Organism ID, Bacteria SERRATIA MARCESCENS   Final  GRAM STAIN     Status: None   Collection Time    03/30/13  3:50 PM      Result Value Range Status   Specimen Description WOUND BACK   Final   Special Requests NONE   Final   Gram Stain     Final   Value: MODERATE WBC PRESENT,BOTH PMN AND MONONUCLEAR     NO ORGANISMS SEEN   Report Status 03/30/2013 FINAL   Final  MRSA PCR SCREENING     Status: None   Collection Time    03/30/13 10:34 PM      Result Value Range Status   MRSA by PCR NEGATIVE  NEGATIVE Final   Comment:            The GeneXpert MRSA Assay (FDA     approved for NASAL specimens     only), is one component of a     comprehensive MRSA colonization     surveillance program. It is not     intended to diagnose MRSA     infection nor to guide or     monitor treatment for     MRSA infections.  URINE CULTURE     Status: None   Collection Time    03/31/13 11:17 PM      Result Value Range Status   Specimen Description URINE, CATHETERIZED   Final   Special Requests CX ADDED AT [id number]   Final   Culture  Setup Time     Final   Value: 04/01/2013 00:58     Performed at [facility]   Colony Count     Final   Value: NO GROWTH     Performed at [facility]   Culture     Final   Value: NO GROWTH     Performed at  [facility]   Report Status 04/02/2013 FINAL   Final  CULTURE, BLOOD (ROUTINE X 2)     Status: None   Collection Time    04/04/13  9:20 AM      Result Value Range Status   Specimen Description BLOOD RIGHT ANTECUBITAL   Final   Special Requests BOTTLES DRAWN AEROBIC ONLY 10CC   Final   Culture  Setup Time      Final   Value: 04/04/2013 16:48     Performed at [facility]   Culture     Final   Value:        BLOOD CULTURE RECEIVED NO GROWTH TO DATE CULTURE WILL BE HELD FOR 5 DAYS BEFORE ISSUING A FINAL NEGATIVE REPORT     Performed at [facility]   Report Status PENDING   Incomplete  CULTURE, BLOOD (ROUTINE X 2)     Status: None   Collection Time    04/04/13  9:50 AM      Result Value Range Status   Specimen Description BLOOD HAND RIGHT   Final   Special Requests BOTTLES DRAWN AEROBIC ONLY 10CC   Final   Culture  Setup Time     Final   Value: 04/04/2013 16:48     Performed at [facility]   Culture     Final   Value:        BLOOD CULTURE RECEIVED NO GROWTH TO DATE CULTURE WILL BE HELD FOR 5 DAYS BEFORE ISSUING A FINAL NEGATIVE REPORT     Performed at [facility]   Report Status PENDING   Incomplete     Studies: No results found.  Scheduled Meds: . antiseptic oral rinse  15 mL Mouth Rinse BID  . cefTRIAXone (ROCEPHIN)  IV  2 g Intravenous Q24H  . docusate sodium  100 mg Oral BID  . feeding supplement  30 mL Oral BID WC  . feeding supplement  1 Container Oral TID BM  . glipiZIDE  2.5 mg Oral Q breakfast  . insulin aspart  0-9 Units Subcutaneous TID WC  . potassium chloride  40 mEq Oral Q2H   Continuous Infusions:  Christiane Ha, MD [facility] Pager [phone]  If 7PM-7AM, please contact night-coverage at [email or web address], password [facility] 04/07/2013, 8:10 AM  LOS: 9 days

## 2013-04-10 LAB — CULTURE, BLOOD (ROUTINE X 2): Culture: NO GROWTH

## 2013-04-11 ENCOUNTER — Emergency Department (HOSPITAL_COMMUNITY): Payer: Medicare Other

## 2013-04-11 ENCOUNTER — Encounter (HOSPITAL_COMMUNITY): Payer: Self-pay

## 2013-04-11 ENCOUNTER — Inpatient Hospital Stay (HOSPITAL_COMMUNITY): Payer: Medicare Other

## 2013-04-11 ENCOUNTER — Inpatient Hospital Stay (HOSPITAL_COMMUNITY)
Admission: EM | Admit: 2013-04-11 | Discharge: 2013-04-15 | DRG: 856 | Disposition: A | Discharge status: Other Acute Inpatient Hospital | Payer: Medicare Other | Attending: Pulmonary Disease | Admitting: Pulmonary Disease

## 2013-04-11 DIAGNOSIS — I824Z9 Acute embolism and thrombosis of unspecified deep veins of unspecified distal lower extremity: Secondary | ICD-10-CM | POA: Diagnosis present

## 2013-04-11 DIAGNOSIS — Z87891 Personal history of nicotine dependence: Secondary | ICD-10-CM

## 2013-04-11 DIAGNOSIS — Z981 Arthrodesis status: Secondary | ICD-10-CM

## 2013-04-11 DIAGNOSIS — R791 Abnormal coagulation profile: Secondary | ICD-10-CM | POA: Diagnosis present

## 2013-04-11 DIAGNOSIS — I252 Old myocardial infarction: Secondary | ICD-10-CM

## 2013-04-11 DIAGNOSIS — N400 Enlarged prostate without lower urinary tract symptoms: Secondary | ICD-10-CM | POA: Diagnosis present

## 2013-04-11 DIAGNOSIS — A419 Sepsis, unspecified organism: Secondary | ICD-10-CM

## 2013-04-11 DIAGNOSIS — E876 Hypokalemia: Secondary | ICD-10-CM

## 2013-04-11 DIAGNOSIS — L0390 Cellulitis, unspecified: Secondary | ICD-10-CM

## 2013-04-11 DIAGNOSIS — Y838 Other surgical procedures as the cause of abnormal reaction of the patient, or of later complication, without mention of misadventure at the time of the procedure: Secondary | ICD-10-CM | POA: Diagnosis present

## 2013-04-11 DIAGNOSIS — E2749 Other adrenocortical insufficiency: Secondary | ICD-10-CM | POA: Diagnosis present

## 2013-04-11 DIAGNOSIS — J9600 Acute respiratory failure, unspecified whether with hypoxia or hypercapnia: Secondary | ICD-10-CM | POA: Diagnosis not present

## 2013-04-11 DIAGNOSIS — E872 Acidosis, unspecified: Secondary | ICD-10-CM | POA: Diagnosis present

## 2013-04-11 DIAGNOSIS — L02219 Cutaneous abscess of trunk, unspecified: Secondary | ICD-10-CM | POA: Diagnosis present

## 2013-04-11 DIAGNOSIS — D638 Anemia in other chronic diseases classified elsewhere: Secondary | ICD-10-CM | POA: Diagnosis present

## 2013-04-11 DIAGNOSIS — I82629 Acute embolism and thrombosis of deep veins of unspecified upper extremity: Secondary | ICD-10-CM | POA: Diagnosis present

## 2013-04-11 DIAGNOSIS — M4620 Osteomyelitis of vertebra, site unspecified: Secondary | ICD-10-CM

## 2013-04-11 DIAGNOSIS — R627 Adult failure to thrive: Secondary | ICD-10-CM | POA: Diagnosis present

## 2013-04-11 DIAGNOSIS — I10 Essential (primary) hypertension: Secondary | ICD-10-CM

## 2013-04-11 DIAGNOSIS — D62 Acute posthemorrhagic anemia: Secondary | ICD-10-CM | POA: Diagnosis present

## 2013-04-11 DIAGNOSIS — I129 Hypertensive chronic kidney disease with stage 1 through stage 4 chronic kidney disease, or unspecified chronic kidney disease: Secondary | ICD-10-CM | POA: Diagnosis present

## 2013-04-11 DIAGNOSIS — L0291 Cutaneous abscess, unspecified: Secondary | ICD-10-CM

## 2013-04-11 DIAGNOSIS — E785 Hyperlipidemia, unspecified: Secondary | ICD-10-CM | POA: Diagnosis present

## 2013-04-11 DIAGNOSIS — E119 Type 2 diabetes mellitus without complications: Secondary | ICD-10-CM | POA: Diagnosis present

## 2013-04-11 DIAGNOSIS — D649 Anemia, unspecified: Secondary | ICD-10-CM

## 2013-04-11 DIAGNOSIS — T8140XA Infection following a procedure, unspecified, initial encounter: Principal | ICD-10-CM | POA: Diagnosis present

## 2013-04-11 DIAGNOSIS — L8992 Pressure ulcer of unspecified site, stage 2: Secondary | ICD-10-CM | POA: Diagnosis present

## 2013-04-11 DIAGNOSIS — I2510 Atherosclerotic heart disease of native coronary artery without angina pectoris: Secondary | ICD-10-CM | POA: Diagnosis present

## 2013-04-11 DIAGNOSIS — G9341 Metabolic encephalopathy: Secondary | ICD-10-CM | POA: Diagnosis present

## 2013-04-11 DIAGNOSIS — L8990 Pressure ulcer of unspecified site, unspecified stage: Secondary | ICD-10-CM | POA: Diagnosis present

## 2013-04-11 DIAGNOSIS — M129 Arthropathy, unspecified: Secondary | ICD-10-CM | POA: Diagnosis present

## 2013-04-11 DIAGNOSIS — E43 Unspecified severe protein-calorie malnutrition: Secondary | ICD-10-CM

## 2013-04-11 DIAGNOSIS — Z951 Presence of aortocoronary bypass graft: Secondary | ICD-10-CM

## 2013-04-11 DIAGNOSIS — D500 Iron deficiency anemia secondary to blood loss (chronic): Secondary | ICD-10-CM | POA: Diagnosis present

## 2013-04-11 DIAGNOSIS — N189 Chronic kidney disease, unspecified: Secondary | ICD-10-CM | POA: Diagnosis present

## 2013-04-11 DIAGNOSIS — M869 Osteomyelitis, unspecified: Secondary | ICD-10-CM

## 2013-04-11 LAB — CBC
Hemoglobin: 7.3 g/dL — ABNORMAL LOW (ref 13.0–17.0)
MCHC: 33.3 g/dL (ref 30.0–36.0)
Platelets: 84 10*3/uL — ABNORMAL LOW (ref 150–400)
RBC: 2.38 MIL/uL — ABNORMAL LOW (ref 4.22–5.81)

## 2013-04-11 LAB — COMPREHENSIVE METABOLIC PANEL
ALT: 23 U/L (ref 0–53)
AST: 46 U/L — ABNORMAL HIGH (ref 0–37)
AST: 37 U/L (ref 0–37)
Albumin: 1.3 g/dL — ABNORMAL LOW (ref 3.5–5.2)
Alkaline Phosphatase: 72 U/L (ref 39–117)
BUN: 43 mg/dL — ABNORMAL HIGH (ref 6–23)
Creatinine, Ser: 1.04 mg/dL (ref 0.50–1.35)
GFR calc Af Amer: >90 mL/min (ref >90)
Glucose, Bld: 87 mg/dL (ref 70–99)
Potassium: 3.6 mEq/L (ref 3.5–5.1)
Potassium: 3.3 mEq/L — ABNORMAL LOW (ref 3.5–5.1)
Sodium: 143 mEq/L (ref 135–145)
Total Protein: 4.7 g/dL — ABNORMAL LOW (ref 6.0–8.3)
Total Protein: 4.5 g/dL — ABNORMAL LOW (ref 6.0–8.3)

## 2013-04-11 LAB — URINALYSIS, ROUTINE W REFLEX MICROSCOPIC
Nitrite: POSITIVE — AB
Specific Gravity, Urine: 1.021 (ref 1.005–1.030)
Urobilinogen, UA: 1 mg/dL (ref 0.0–1.0)
pH: 5 (ref 5.0–8.0)

## 2013-04-11 LAB — CBC WITH DIFFERENTIAL/PLATELET
Basophils Absolute: 0 10*3/uL (ref 0.0–0.1)
Eosinophils Absolute: 0 10*3/uL (ref 0.0–0.7)
HCT: 24.2 % — ABNORMAL LOW (ref 39.0–52.0)
Lymphocytes Relative: 5 % — ABNORMAL LOW (ref 12–46)
Lymphs Abs: 0.4 10*3/uL — ABNORMAL LOW (ref 0.7–4.0)
MCHC: 33.1 g/dL (ref 30.0–36.0)
Monocytes Relative: 4 % (ref 3–12)
Neutro Abs: 7.2 10*3/uL (ref 1.7–7.7)
Platelets: 91 10*3/uL — ABNORMAL LOW (ref 150–400)
RDW: 19.6 % — ABNORMAL HIGH (ref 11.5–15.5)
WBC: 7.9 10*3/uL (ref 4.0–10.5)

## 2013-04-11 LAB — FIBRINOGEN: Fibrinogen: 211 mg/dL (ref 204–475)

## 2013-04-11 LAB — POCT I-STAT, CHEM 8
BUN: 37 mg/dL — ABNORMAL HIGH (ref 6–23)
Potassium: 3.5 mEq/L (ref 3.5–5.1)
Sodium: 143 mEq/L (ref 135–145)
TCO2: 25 mmol/L (ref 0–100)

## 2013-04-11 LAB — APTT: aPTT: 33 seconds (ref 24–37)

## 2013-04-11 LAB — URINE MICROSCOPIC-ADD ON

## 2013-04-11 LAB — PROTIME-INR
INR: 1.87 — ABNORMAL HIGH (ref 0.00–1.49)
INR: 1.9 — ABNORMAL HIGH (ref 0.00–1.49)
Prothrombin Time: 21 seconds — ABNORMAL HIGH (ref 11.6–15.2)
Prothrombin Time: 21.2 seconds — ABNORMAL HIGH (ref 11.6–15.2)

## 2013-04-11 LAB — CG4 I-STAT (LACTIC ACID)
Lactic Acid, Venous: 3.9 mmol/L — ABNORMAL HIGH (ref 0.5–2.2)
Lactic Acid, Venous: 3.22 mmol/L — ABNORMAL HIGH (ref 0.5–2.2)

## 2013-04-11 MED ORDER — INSULIN ASPART 100 UNIT/ML ~~LOC~~ SOLN
2.0000 [IU] | SUBCUTANEOUS | Status: DC
  Administered 2013-04-12 (×4): 2 [IU] via SUBCUTANEOUS
  Administered 2013-04-13: 4 [IU] via SUBCUTANEOUS
  Administered 2013-04-13 – 2013-04-14 (×2): 2 [IU] via SUBCUTANEOUS

## 2013-04-11 MED ORDER — VANCOMYCIN HCL 10 G IV SOLR
1500.0000 mg | Freq: Once | INTRAVENOUS | Status: AC
  Administered 2013-04-11: 1500 mg via INTRAVENOUS
  Filled 2013-04-11: qty 1500

## 2013-04-11 MED ORDER — POTASSIUM CHLORIDE 10 MEQ/100ML IV SOLN
10.0000 meq | INTRAVENOUS | Status: AC
  Administered 2013-04-11 – 2013-04-12 (×3): 10 meq via INTRAVENOUS
  Filled 2013-04-11: qty 300

## 2013-04-11 MED ORDER — PIPERACILLIN-TAZOBACTAM 3.375 G IVPB
3.3750 g | Freq: Once | INTRAVENOUS | Status: DC
  Filled 2013-04-11: qty 50

## 2013-04-11 MED ORDER — PIPERACILLIN-TAZOBACTAM 3.375 G IVPB 30 MIN
3.3750 g | Freq: Three times a day (TID) | INTRAVENOUS | Status: DC
  Filled 2013-04-11 (×2): qty 50

## 2013-04-11 MED ORDER — NOREPINEPHRINE BITARTRATE 1 MG/ML IJ SOLN
2.0000 ug/min | INTRAVENOUS | Status: DC
  Filled 2013-04-11: qty 4

## 2013-04-11 MED ORDER — SODIUM CHLORIDE 0.9 % IV BOLUS (SEPSIS)
1000.0000 mL | Freq: Once | INTRAVENOUS | Status: AC
  Administered 2013-04-11: 1000 mL via INTRAVENOUS

## 2013-04-11 MED ORDER — SODIUM CHLORIDE 0.9 % IV BOLUS (SEPSIS): 1000.0000 mL | INTRAVENOUS | Status: DC | PRN

## 2013-04-11 MED ORDER — HEPARIN SODIUM (PORCINE) 5000 UNIT/ML IJ SOLN
5000.0000 [IU] | Freq: Three times a day (TID) | INTRAMUSCULAR | Status: DC
  Administered 2013-04-11 – 2013-04-13 (×5): 5000 [IU] via SUBCUTANEOUS
  Filled 2013-04-11 (×8): qty 1

## 2013-04-11 MED ORDER — SODIUM CHLORIDE 0.9 % IV SOLN
250.0000 mL | INTRAVENOUS | Status: DC | PRN
  Administered 2013-04-13: 14:00:00 via INTRAVENOUS

## 2013-04-11 MED ORDER — PIPERACILLIN-TAZOBACTAM 3.375 G IVPB
3.3750 g | Freq: Three times a day (TID) | INTRAVENOUS | Status: DC
  Administered 2013-04-12 – 2013-04-13 (×5): 3.375 g via INTRAVENOUS
  Filled 2013-04-11 (×8): qty 50

## 2013-04-11 MED ORDER — POTASSIUM CHLORIDE CRYS ER 20 MEQ PO TBCR: 40.0000 meq | EXTENDED_RELEASE_TABLET | Freq: Three times a day (TID) | ORAL | Status: DC

## 2013-04-11 MED ORDER — SODIUM CHLORIDE 0.9 % IV SOLN
INTRAVENOUS | Status: DC
  Administered 2013-04-11: 1000 mL via INTRAVENOUS
  Administered 2013-04-12 (×2): via INTRAVENOUS
  Administered 2013-04-13: 75 mL/h via INTRAVENOUS

## 2013-04-11 MED ORDER — PIPERACILLIN-TAZOBACTAM 3.375 G IVPB 30 MIN
3.3750 g | Freq: Once | INTRAVENOUS | Status: AC
  Administered 2013-04-11: 3.375 g via INTRAVENOUS

## 2013-04-11 MED ORDER — DEXTROSE 50 % IV SOLN
INTRAVENOUS | Status: AC
  Filled 2013-04-11: qty 50

## 2013-04-11 MED ORDER — VANCOMYCIN HCL IN DEXTROSE 1-5 GM/200ML-% IV SOLN
1000.0000 mg | Freq: Two times a day (BID) | INTRAVENOUS | Status: DC
  Administered 2013-04-12 – 2013-04-13 (×3): 1000 mg via INTRAVENOUS
  Filled 2013-04-11 (×4): qty 200

## 2013-04-11 MED ORDER — DEXTROSE 50 % IV SOLN
1.0000 | INTRAVENOUS | Status: AC
  Administered 2013-04-11: 50 mL via INTRAVENOUS

## 2013-04-11 MED ORDER — HYDROCORTISONE SOD SUCCINATE 100 MG IJ SOLR
50.0000 mg | Freq: Four times a day (QID) | INTRAMUSCULAR | Status: DC
  Administered 2013-04-12 – 2013-04-13 (×6): 50 mg via INTRAVENOUS
  Filled 2013-04-11 (×10): qty 1

## 2013-04-11 NOTE — ED Notes (Signed)
IV Team called to have PICC line removed.

## 2013-04-11 NOTE — Consult Note (Signed)
Reason for Consult:Sepsis post-spine surgery Referring Physician: Dr Alfonse Alpers Muellner is an 79 y.o. male.  HPI: [name] [name] is a 79 yo male with complex history in regards to his back. He had a T9-L3 fusion on 7/30 due to a burst fracture. He returned approximately 2 weeks later with wound drainage and migration of his hardware. Dr. Shon Baton took him back to surgery for hardware removal and Irrigation and debridement. He was discharged to [facility] approximately 4 days ago and noted to have increased wound drainage and fever today. He was taken to the [facility] ED and found to be hypotensive, febrile and the septic shock protocol was initiated. We were consulted to evaluate his back. He reports no increase in back pain and denies any new leg symptoms.  Past Medical History  Diagnosis Date  . Hypertension   . Diabetes mellitus without complication   . Chronic kidney disease     bph  . Arthritis   . Coronary artery disease   . Myocardial infarction     Past Surgical History  Procedure Laterality Date  . Appendectomy    . Coronary artery bypass graft  11/09/2000    LIMA to LAD, SVG to RPDA, seq SVG to OM2 and D1 by [name] [facility]  . Lumbar fusion  03/15/2013    Dr Shon Baton  . Posterior lumbar fusion 4 level N/A 03/16/2013    Procedure: T9 - L3 POSTERIOR POSTERIOR SPINAL FUSION ;  Surgeon: Venita Lick, MD;  Location: [facility] OR;  Service: Orthopedics;  Laterality: N/A;  . Hardware removal N/A 03/30/2013    Procedure: HARDWARE REMOVAL/IRRIGATION & DEBRIDEMENT OF WOUND ON BACK;  Surgeon: Venita Lick, MD;  Location: [facility] OR;  Service: Orthopedics;  Laterality: N/A;    Family History  Problem Relation Age of Onset  . Diabetes Mother   . Stroke Brother     x2    Social History:  reports that he has quit smoking. He has never used smokeless tobacco. He reports that he does not drink alcohol or use illicit drugs.  Allergies: No Known Allergies  Medications: I have reviewed the  patient's current medications.  Results for orders placed during the hospital encounter of 04/11/13 (from the past 48 hour(s))  CBC WITH DIFFERENTIAL     Status: Abnormal   Collection Time    04/11/13  3:12 PM      Result Value Range   WBC 7.9  4.0 - 10.5 K/uL   RBC 2.64 (*) 4.22 - 5.81 MIL/uL   Hemoglobin 8.0 (*) 13.0 - 17.0 g/dL   HCT 96.0 (*) 45.4 - 09.8 %   MCV 91.7  78.0 - 100.0 fL   MCH 30.3  26.0 - 34.0 pg   MCHC 33.1  30.0 - 36.0 g/dL   RDW 11.9 (*) 14.7 - 82.9 %   Platelets 91 (*) 150 - 400 K/uL   Comment: CONSISTENT WITH PREVIOUS RESULT   Neutrophils Relative % 91 (*) 43 - 77 %   Lymphocytes Relative 5 (*) 12 - 46 %   Monocytes Relative 4  3 - 12 %   Eosinophils Relative 0  0 - 5 %   Basophils Relative 0  0 - 1 %   Neutro Abs 7.2  1.7 - 7.7 K/uL   Lymphs Abs 0.4 (*) 0.7 - 4.0 K/uL   Monocytes Absolute 0.3  0.1 - 1.0 K/uL   Eosinophils Absolute 0.0  0.0 - 0.7 K/uL   Basophils Absolute 0.0  0.0 -  0.1 K/uL   WBC Morphology MILD LEFT SHIFT (1-5% METAS, OCC MYELO, OCC BANDS)    COMPREHENSIVE METABOLIC PANEL     Status: Abnormal   Collection Time    04/11/13  3:12 PM      Result Value Range   Sodium 141  135 - 145 mEq/L   Potassium 3.6  3.5 - 5.1 mEq/L   Chloride 108  96 - 112 mEq/L   CO2 22  19 - 32 mEq/L   Glucose, Bld 66 (*) 70 - 99 mg/dL   BUN 43 (*) 6 - 23 mg/dL   Creatinine, Ser 1.32  0.50 - 1.35 mg/dL   Calcium 9.5  8.4 - 44.0 mg/dL   Total Protein 4.7 (*) 6.0 - 8.3 g/dL   Albumin 1.3 (*) 3.5 - 5.2 g/dL   AST 46 (*) 0 - 37 U/L   ALT 25  0 - 53 U/L   Alkaline Phosphatase 83  39 - 117 U/L   Total Bilirubin 2.9 (*) 0.3 - 1.2 mg/dL   GFR calc non Af Amer 68 (*) >90 mL/min   GFR calc Af Amer 78 (*) >90 mL/min   Comment: (NOTE)     The eGFR has been calculated using the CKD EPI equation.     This calculation has not been validated in all clinical situations.     eGFR's persistently <90 mL/min signify possible Chronic Kidney     Disease.  PROTIME-INR      Status: Abnormal   Collection Time    04/11/13  3:12 PM      Result Value Range   Prothrombin Time 21.0 (*) 11.6 - 15.2 seconds   INR 1.87 (*) 0.00 - 1.49  GLUCOSE, CAPILLARY     Status: Abnormal   Collection Time    04/11/13  3:33 PM      Result Value Range   Glucose-Capillary 69 (*) 70 - 99 mg/dL  CG4 I-STAT (LACTIC ACID)     Status: Abnormal   Collection Time    04/11/13  3:44 PM      Result Value Range   Lactic Acid, Venous 3.90 (*) 0.5 - 2.2 mmol/L  POCT I-STAT, CHEM 8     Status: Abnormal   Collection Time    04/11/13  3:45 PM      Result Value Range   Sodium 143  135 - 145 mEq/L   Potassium 3.5  3.5 - 5.1 mEq/L   Chloride 107  96 - 112 mEq/L   BUN 37 (*) 6 - 23 mg/dL   Creatinine, Ser 1.02  0.50 - 1.35 mg/dL   Glucose, Bld 66 (*) 70 - 99 mg/dL   Calcium, Ion 7.25 (*) 1.13 - 1.30 mmol/L   TCO2 25  0 - 100 mmol/L   Hemoglobin 7.8 (*) 13.0 - 17.0 g/dL   HCT 36.6 (*) 44.0 - 34.7 %  URINALYSIS, ROUTINE W REFLEX MICROSCOPIC     Status: Abnormal   Collection Time    04/11/13  3:51 PM      Result Value Range   Color, Urine RED (*) YELLOW   Comment: BIOCHEMICALS MAY BE AFFECTED BY COLOR   APPearance TURBID (*) CLEAR   Specific Gravity, Urine 1.021  1.005 - 1.030   pH 5.0  5.0 - 8.0   Glucose, UA NEGATIVE  NEGATIVE mg/dL   Hgb urine dipstick NEGATIVE  NEGATIVE   Bilirubin Urine MODERATE (*) NEGATIVE   Ketones, ur 15 (*) NEGATIVE mg/dL   Protein, ur  NEGATIVE  NEGATIVE mg/dL   Urobilinogen, UA 1.0  0.0 - 1.0 mg/dL   Nitrite POSITIVE (*) NEGATIVE   Leukocytes, UA SMALL (*) NEGATIVE  URINE MICROSCOPIC-ADD ON     Status: Abnormal   Collection Time    04/11/13  3:51 PM      Result Value Range   Squamous Epithelial / LPF FEW (*) RARE   WBC, UA 0-2  <3 WBC/hpf   RBC / HPF 0-2  <3 RBC/hpf   Bacteria, UA RARE  RARE   Urine-Other AMORPHOUS URATES/PHOSPHATES      Dg Chest Portable 1 View  04/11/2013   *RADIOLOGY REPORT*  Clinical Data: Short of breath, lung infection   PORTABLE CHEST - 1 VIEW  Comparison: Prior chest x-ray 04/04/2013  Findings: Right upper extremity PICC.  The catheter tip projects over the superior cavoatrial junction.  Stable cardiac and mediastinal contours.  Continued left retrocardiac opacity and probable small left pleural effusion.  Stable pulmonary vascular congestion without overt edema and linear right basilar atelectasis.  IMPRESSION: No significant interval change in the appearance of the chest compared to 04/04/2013.  Persistent left greater than right basilar atelectasis versus consolidation.  The tip of the right upper extremity PICC projects over the superior cavoatrial junction.   Original Report Authenticated By: Malachy Moan, [name].D.    ROS Blood pressure 89/33, pulse 88, temperature 99.8 F (37.7 C), temperature source Rectal, resp. rate 37, SpO2 97.00%. Physical Exam Physical Examination: General appearance - drowsy, ill appearing in NAD Mental status - drowsy but easily aroused. Oriented to person, place ThoracoLumbar spine incision with copious amounts of serous drainage and with necrotic skin edges. Has mild surrounding erythema.   Assessment/Plan: Sepsis with spine wound infection- Stabilized by ED physician. BP now stable. Was febrile upon presentation with hypotension. Wound does not appear grossly infected and will not require urgent debridement but will probably require further I & D once stabilized. I will notify Dr. Shon Baton and he will see the patient tomorrow with further plans per his assessment.  Loanne Drilling 04/11/2013, 5:28 PM

## 2013-04-11 NOTE — Progress Notes (Signed)
Pharmacy response to Code Sepsis Level 2 called at 3:22pm. Zosyn 3.375g and Vancomycin 1.5g ordered. I pulled the Zosyn from pyxis and handed it to the nurse at 3:32pm. Will follow up admission orders for further antibiotic dosing.  Louie Casa, PharmD, BCPS 04/11/13, 3:33 PM

## 2013-04-11 NOTE — ED Notes (Signed)
CBG 86. 

## 2013-04-11 NOTE — ED Notes (Signed)
Foley catheter 14Fr inserted with sterile technique.

## 2013-04-11 NOTE — ED Notes (Signed)
Checked patient blood sugar it was 69 notified RN Sarah of blood sugar

## 2013-04-11 NOTE — Progress Notes (Signed)
ANTIBIOTIC CONSULT NOTE - INITIAL  Pharmacy Consult for Vancomycin/zosyn Indication: cepsis/cellulitis  No Known Allergies  Patient Measurements: Height: 5\' 10"  (177.8 cm) Weight: 207 lb 3.7 oz (94 kg) IBW/kg (Calculated) : 73  Vital Signs: Temp: 98.4 F (36.9 C) (08/25 1851) Temp src: Oral (08/25 1851) BP: 118/52 mmHg (08/25 1851) Pulse Rate: 83 (08/25 1815) Intake/Output from previous day:   Intake/Output from this shift:    Labs:  Recent Labs  04/11/13 1512 04/11/13 1545  WBC 7.9  --   HGB 8.0* 7.8*  PLT 91*  --   CREATININE 1.04 1.20   Estimated Creatinine Clearance: 60.3 ml/min (by C-G formula based on Cr of 1.2). No results found for this basename: VANCOTROUGH, VANCOPEAK, VANCORANDOM, GENTTROUGH, GENTPEAK, GENTRANDOM, TOBRATROUGH, TOBRAPEAK, TOBRARND, AMIKACINPEAK, AMIKACINTROU, AMIKACIN,  in the last 72 hours   Microbiology: Recent Results (from the past 720 hour(s))  ANAEROBIC CULTURE     Status: None   Collection Time    03/30/13  3:50 PM      Result Value Range Status   Specimen Description WOUND BACK   Final   Special Requests NONE   Final   Gram Stain     Final   Value: MODERATE WBC PRESENT,BOTH PMN AND MONONUCLEAR     NO SQUAMOUS EPITHELIAL CELLS SEEN     NO ORGANISMS SEEN     Performed at [facility]     Performed at [facility]   Culture     Final   Value: NO ANAEROBES ISOLATED     Performed at [facility]   Report Status 04/04/2013 FINAL   Final  WOUND CULTURE     Status: None   Collection Time    03/30/13  3:50 PM      Result Value Range Status   Specimen Description WOUND BACK   Final   Special Requests NONE   Final   Gram Stain     Final   Value: MODERATE WBC PRESENT,BOTH PMN AND MONONUCLEAR     NO SQUAMOUS EPITHELIAL CELLS SEEN     NO ORGANISMS SEEN     Performed at [facility]     Performed at [facility]   Culture     Final   Value: FEW SERRATIA MARCESCENS     Performed at [facility]   Report Status 04/02/2013 FINAL   Final   Organism ID, Bacteria SERRATIA MARCESCENS   Final  GRAM STAIN     Status: None   Collection Time    03/30/13  3:50 PM      Result Value Range Status   Specimen Description WOUND BACK   Final   Special Requests NONE   Final   Gram Stain     Final   Value: MODERATE WBC PRESENT,BOTH PMN AND MONONUCLEAR     NO ORGANISMS SEEN   Report Status 03/30/2013 FINAL   Final  MRSA PCR SCREENING     Status: None   Collection Time    03/30/13 10:34 PM      Result Value Range Status   MRSA by PCR NEGATIVE  NEGATIVE Final   Comment:            The GeneXpert MRSA Assay (FDA     approved for NASAL specimens     only), is one component of a     comprehensive MRSA colonization     surveillance program. It is not     intended to diagnose  MRSA     infection nor to guide or     monitor treatment for     MRSA infections.  URINE CULTURE     Status: None   Collection Time    03/31/13 11:17 PM      Result Value Range Status   Specimen Description URINE, CATHETERIZED   Final   Special Requests CX ADDED AT [id number]   Final   Culture  Setup Time     Final   Value: 04/01/2013 00:58     Performed at [facility]   Colony Count     Final   Value: NO GROWTH     Performed at [facility]   Culture     Final   Value: NO GROWTH     Performed at [facility]   Report Status 04/02/2013 FINAL   Final  CULTURE, BLOOD (ROUTINE X 2)     Status: None   Collection Time    04/04/13  9:20 AM      Result Value Range Status   Specimen Description BLOOD RIGHT ANTECUBITAL   Final   Special Requests BOTTLES DRAWN AEROBIC ONLY 10CC   Final   Culture  Setup Time     Final   Value: 04/04/2013 16:48     Performed at [facility]   Culture     Final   Value: NO GROWTH 5 DAYS     Performed at [facility]   Report Status 04/10/2013 FINAL   Final  CULTURE, BLOOD (ROUTINE X 2)     Status: None   Collection Time    04/04/13   9:50 AM      Result Value Range Status   Specimen Description BLOOD HAND RIGHT   Final   Special Requests BOTTLES DRAWN AEROBIC ONLY 10CC   Final   Culture  Setup Time     Final   Value: 04/04/2013 16:48     Performed at [facility]   Culture     Final   Value: NO GROWTH 5 DAYS     Performed at [facility]   Report Status 04/10/2013 FINAL   Final    Medical History: Past Medical History  Diagnosis Date  . Hypertension   . Diabetes mellitus without complication   . Chronic kidney disease     bph  . Arthritis   . Coronary artery disease   . Myocardial infarction     Assessment: 61 YOM s/p recent back surgery on 7/30, presented with hypotension, lactic acidosis, code sepsis was initiated, Pt. Received zosyn 3.375g at 1600 and vancomycin 1500 ng at 1639 while in the ED. Pharmacy is consulted to dose vancomycin/zosyn for sepsis d/t cellulitis. Scr 1.04, est. crcl ~ 60 ml/min  Goal of Therapy:  Vancomycin trough level 15-20 mcg/ml  Plan:  - Vancomycin 1g IV Q 12hrs, next dose 0500 - zosyn 3.375g IV Q 8 hrs, next dose at midnight - f/u renal fx and cultures - vancomycin trough at steady state.  Bayard Hugger, PharmD, BCPS  Clinical Pharmacist  Pager: [phone]   04/11/2013,7:17 PM

## 2013-04-11 NOTE — ED Notes (Signed)
Patient was given 650 mg of tylenol per SNF.

## 2013-04-11 NOTE — ED Provider Notes (Signed)
CSN: [id number]     Arrival date & time 04/11/13  1456 History     First MD Initiated Contact with Patient 04/11/13 1500     Chief Complaint  Patient presents with  . Wound Infection   (Consider location/radiation/quality/duration/timing/severity/associated sxs/prior Treatment) HPI Comments: 79 yo male with recent surgery on his back for and infected hardware now presents with change in his mental status, fever and drainage from his wound site on his back. His temp was 100.6 at the rehab facility and his mental status was declining. They also noticed the picc line he's been receiving ceftin in has become more swollen and erythematous. They noticed he was also breathing harder and had pulse ox at about 92%. They sent him to the ER for further evaluation.  The history is provided by the EMS personnel and the nursing home.    Past Medical History  Diagnosis Date  . Hypertension   . Diabetes mellitus without complication   . Chronic kidney disease     bph  . Arthritis   . Coronary artery disease   . Myocardial infarction    Past Surgical History  Procedure Laterality Date  . Appendectomy    . Coronary artery bypass graft  11/09/2000    LIMA to LAD, SVG to RPDA, seq SVG to OM2 and D1 by [name] [facility]  . Lumbar fusion  03/15/2013    Dr Shon Baton  . Posterior lumbar fusion 4 level N/A 03/16/2013    Procedure: T9 - L3 POSTERIOR POSTERIOR SPINAL FUSION ;  Surgeon: Venita Lick, MD;  Location: [facility] OR;  Service: Orthopedics;  Laterality: N/A;  . Hardware removal N/A 03/30/2013    Procedure: HARDWARE REMOVAL/IRRIGATION & DEBRIDEMENT OF WOUND ON BACK;  Surgeon: Venita Lick, MD;  Location: [facility] OR;  Service: Orthopedics;  Laterality: N/A;   Family History  Problem Relation Age of Onset  . Diabetes Mother   . Stroke Brother     x2   History  Substance Use Topics  . Smoking status: Former Games developer  . Smokeless tobacco: Never Used  . Alcohol Use: No     Comment: x 10 yrs     Review of Systems  Unable to perform ROS: Mental status change    Allergies  Review of patient's allergies indicates no known allergies.  Home Medications   Current Outpatient Rx  Name  Route  Sig  Dispense  Refill  . brimonidine (ALPHAGAN) 0.2 % ophthalmic solution   Both Eyes   Place 1 drop into both eyes 2 (two) times daily.         . calcium-vitamin D (OSCAL WITH D) 250-125 MG-UNIT per tablet   Oral   Take 2 tablets by mouth 2 (two) times daily.         . cholecalciferol (VITAMIN D) 1000 UNITS tablet   Oral   Take 1,000 Units by mouth daily.         . Coenzyme Q10 (COQ-10) 100 MG CAPS   Oral   Take 200 mg by mouth daily.         [name] dextrose 5 % SOLN 50 mL with cefTRIAXone 2 G SOLR 2 g   Intravenous   Inject 2 g into the vein daily.   2 g   0     Will need ceftriaxone x 6 weeks per ID   . docusate sodium (COLACE) 100 MG capsule   Oral   Take 1 capsule (100 mg total) by mouth 3 (three)  times daily as needed for constipation.   30 capsule   0   . glipiZIDE (GLUCOTROL) 5 MG tablet   Oral   Take 2.5 mg by mouth daily.          [name] HYDROcodone-acetaminophen (NORCO/VICODIN) 5-325 MG per tablet   Oral   Take 1 tablet by mouth every 6 (six) hours as needed for pain.   45 tablet   0   . losartan (COZAAR) 100 MG tablet   Oral   Take 100 mg by mouth daily.         . metFORMIN (GLUCOPHAGE) 1000 MG tablet   Oral   Take 1,000 mg by mouth daily.         . methocarbamol (ROBAXIN) 500 MG tablet   Oral   Take 1 tablet (500 mg total) by mouth 3 (three) times daily as needed.   60 tablet   0   . Multiple Vitamins-Minerals (MULTIVITAMIN WITH MINERALS) tablet   Oral   Take 1 tablet by mouth daily.         . ondansetron (ZOFRAN) 4 MG tablet   Oral   Take 1 tablet (4 mg total) by mouth every 8 (eight) hours as needed for nausea.   20 tablet   0   . polyethylene glycol powder (GLYCOLAX) powder   Oral   Take 17 g by mouth daily.   255 g   1    . Pyridoxine HCl (VITAMIN B-6) 500 MG tablet   Oral   Take 500 mg by mouth daily.         . simvastatin (ZOCOR) 20 MG tablet   Oral   Take 10 mg by mouth every evening.         . tamsulosin (FLOMAX) 0.4 MG CAPS   Oral   Take 0.4 mg by mouth daily.          BP 69/26  Pulse 101  Resp 20  SpO2 94% Physical Exam  Nursing note and vitals reviewed. Constitutional: He appears well-developed and well-nourished.  HENT:  Head: Normocephalic and atraumatic.  Right Ear: External ear normal.  Left Ear: External ear normal.  Nose: Nose normal.  Eyes: Right eye exhibits no discharge. Left eye exhibits no discharge.  Neck: Neck supple.  Cardiovascular: Regular rhythm and normal heart sounds.  Tachycardia present.  Exam reveals decreased pulses.   Pulmonary/Chest: Tachypnea noted.  Abdominal: Soft. There is no tenderness.  Musculoskeletal: He exhibits edema (pitting edema to lower and upper extremities).       Back:  Erythema distally to right PICC  Neurological: He is alert. He is disoriented.  Skin: Skin is warm and dry.    ED Course   Procedures (including critical care time)  CRITICAL CARE Performed by: Pricilla Loveless T   Total critical care time: 45 minutes  Critical care time was exclusive of separately billable procedures and treating other patients.  Critical care was necessary to treat or prevent imminent or life-threatening deterioration.  Critical care was time spent personally by me on the following activities: development of treatment plan with patient and/or surrogate as well as nursing, discussions with consultants, evaluation of patient's response to treatment, examination of patient, obtaining history from patient or surrogate, ordering and performing treatments and interventions, ordering and review of laboratory studies, ordering and review of radiographic studies, pulse oximetry and re-evaluation of patient's condition.   Labs Reviewed  CBC WITH  DIFFERENTIAL - Abnormal; Notable for the following:  RBC 2.64 (*)    Hemoglobin 8.0 (*)    HCT 24.2 (*)    RDW 19.6 (*)    Platelets 91 (*)    Neutrophils Relative % 91 (*)    Lymphocytes Relative 5 (*)    Lymphs Abs 0.4 (*)    All other components within normal limits  COMPREHENSIVE METABOLIC PANEL - Abnormal; Notable for the following:    Glucose, Bld 66 (*)    BUN 43 (*)    Total Protein 4.7 (*)    Albumin 1.3 (*)    AST 46 (*)    Total Bilirubin 2.9 (*)    GFR calc non Af Amer 68 (*)    GFR calc Af Amer 78 (*)    All other components within normal limits  PROTIME-INR - Abnormal; Notable for the following:    Prothrombin Time 21.0 (*)    INR 1.87 (*)    All other components within normal limits  URINALYSIS, ROUTINE W REFLEX MICROSCOPIC - Abnormal; Notable for the following:    Color, Urine RED (*)    APPearance TURBID (*)    Bilirubin Urine MODERATE (*)    Ketones, ur 15 (*)    Nitrite POSITIVE (*)    Leukocytes, UA SMALL (*)    All other components within normal limits  GLUCOSE, CAPILLARY - Abnormal; Notable for the following:    Glucose-Capillary 69 (*)    All other components within normal limits  URINE MICROSCOPIC-ADD ON - Abnormal; Notable for the following:    Squamous Epithelial / LPF FEW (*)    All other components within normal limits  POCT I-STAT, CHEM 8 - Abnormal; Notable for the following:    BUN 37 (*)    Glucose, Bld 66 (*)    Calcium, Ion 1.37 (*)    Hemoglobin 7.8 (*)    HCT 23.0 (*)    All other components within normal limits  CG4 I-STAT (LACTIC ACID) - Abnormal; Notable for the following:    Lactic Acid, Venous 3.90 (*)    All other components within normal limits  CULTURE, BLOOD (ROUTINE X 2)  CULTURE, BLOOD (ROUTINE X 2)  URINE CULTURE    Date: 04/11/2013  Rate: 94  Rhythm: normal sinus rhythm  QRS Axis: left  Intervals: normal  ST/T Wave abnormalities: nonspecific ST/T changes  Conduction Disutrbances:none  Narrative  Interpretation: NSR, no acute ischemia  Old EKG Reviewed: unchanged   Dg Chest Portable 1 View  04/11/2013   *RADIOLOGY REPORT*  Clinical Data: Short of breath, lung infection  PORTABLE CHEST - 1 VIEW  Comparison: Prior chest x-ray 04/04/2013  Findings: Right upper extremity PICC.  The catheter tip projects over the superior cavoatrial junction.  Stable cardiac and mediastinal contours.  Continued left retrocardiac opacity and probable small left pleural effusion.  Stable pulmonary vascular congestion without overt edema and linear right basilar atelectasis.  IMPRESSION: No significant interval change in the appearance of the chest compared to 04/04/2013.  Persistent left greater than right basilar atelectasis versus consolidation.  The tip of the right upper extremity PICC projects over the superior cavoatrial junction.   Original Report Authenticated By: Malachy Moan, M.D.   1. Severe sepsis   2. Lactic acidosis     MDM  CODE sepsis called on arrival. Patient given fluids for his hypotension. Blood cultures obtained and will give flank and Zosyn for sepsis is likely due to his PICC line or wound infection. Orthopedics evaluated in emergency department and felt that  he will need his wound explored in the morning. His blood pressure was labile initially in the ED with several hypotensive measurements were minimally responsive to fluids. ICU was consulted for his severe sepsis and will admit. His blood pressure improved and normalized after his third liter. Hold off on central line and pressors at this time.      Audree Camel, MD 04/11/13 [phone]

## 2013-04-11 NOTE — ED Notes (Signed)
Here from [facility] (there for rehab post surgery) Recent back surgery and has copious amounts of drainage from back and had hardware removed due to infection. Discharged on Friday and now continues with fever and increased drainage. hgb 7.7

## 2013-04-11 NOTE — H&P (Addendum)
PULMONARY  / CRITICAL CARE MEDICINE  Name: Davidlee Jeanbaptiste MRN: [id number] DOB: 28-Jun-1934    ADMISSION DATE:  04/11/2013 CONSULTATION DATE:  04/11/2013  REFERRING MD :  EDP PRIMARY SERVICE: PCCM  CHIEF COMPLAINT:  AMS and hypotension.  BRIEF PATIENT DESCRIPTION: 79 year old male s/p back surgery with T9 to L3 fusion on 7/30 who was discharged to SNF to return on 8/25 with sepsis, hypotension and lactic acidosis.  Patient was fluid resuscitated in the ED with marginal stabilization of his BP.  PCCM was called to consult for cellulitis induced septic shock and code sepsis.  SIGNIFICANT EVENTS / STUDIES:  8/25 admission for hypotension  LINES / TUBES: PIV  CULTURES: Blood 8/25>>> U/A 8/25>>> Sputum 8/25>>>  ANTIBIOTICS: Vanc 8/25>>> Zosyn 8/25>>>  PAST MEDICAL HISTORY :  Past Medical History  Diagnosis Date  . Hypertension   . Diabetes mellitus without complication   . Chronic kidney disease     bph  . Arthritis   . Coronary artery disease   . Myocardial infarction    Past Surgical History  Procedure Laterality Date  . Appendectomy    . Coronary artery bypass graft  11/09/2000    LIMA to LAD, SVG to RPDA, seq SVG to OM2 and D1 by [name] [facility]  . Lumbar fusion  03/15/2013    Dr Shon Baton  . Posterior lumbar fusion 4 level N/A 03/16/2013    Procedure: T9 - L3 POSTERIOR POSTERIOR SPINAL FUSION ;  Surgeon: Venita Lick, MD;  Location: [facility] OR;  Service: Orthopedics;  Laterality: N/A;  . Hardware removal N/A 03/30/2013    Procedure: HARDWARE REMOVAL/IRRIGATION & DEBRIDEMENT OF WOUND ON BACK;  Surgeon: Venita Lick, MD;  Location: [facility] OR;  Service: Orthopedics;  Laterality: N/A;   Prior to Admission medications   Medication Sig Start Date End Date Taking? Authorizing Provider  acetaminophen (TYLENOL) 325 MG tablet Take 650 mg by mouth every 6 (six) hours as needed for pain.   Yes Historical Provider, MD  brimonidine (ALPHAGAN) 0.2 % ophthalmic solution Place 1  drop into both eyes 2 (two) times daily.   Yes Historical Provider, MD  calcium-vitamin D (OSCAL WITH D) 250-125 MG-UNIT per tablet Take 2 tablets by mouth 2 (two) times daily.   Yes Historical Provider, MD  cholecalciferol (VITAMIN D) 1000 UNITS tablet Take 1,000 Units by mouth daily.   Yes Historical Provider, MD  Coenzyme Q10 (COQ-10) 100 MG CAPS Take 200 mg by mouth daily.   Yes Historical Provider, MD  dextrose 5 % SOLN 50 mL with cefTRIAXone 2 G SOLR 2 g Inject 2 g into the vein daily. 04/07/13  Yes Zonia Kief, PA-C  docusate sodium (COLACE) 100 MG capsule Take 1 capsule (100 mg total) by mouth 3 (three) times daily as needed for constipation. 03/19/13  Yes Venita Lick, MD  glipiZIDE (GLUCOTROL) 5 MG tablet Take 2.5 mg by mouth daily.    Yes Historical Provider, MD  HYDROcodone-acetaminophen (NORCO/VICODIN) 5-325 MG per tablet Take 1 tablet by mouth every 6 (six) hours as needed for pain. 03/19/13  Yes Venita Lick, MD  losartan (COZAAR) 100 MG tablet Take 100 mg by mouth daily.   Yes Historical Provider, MD  metFORMIN (GLUCOPHAGE) 1000 MG tablet Take 1,000 mg by mouth daily.   Yes Historical Provider, MD  methocarbamol (ROBAXIN) 500 MG tablet Take 1 tablet (500 mg total) by mouth 3 (three) times daily as needed. 03/19/13  Yes Venita Lick, MD  Multiple Vitamins-Minerals (MULTIVITAMIN WITH MINERALS) tablet  Take 1 tablet by mouth daily.   Yes Historical Provider, MD  ondansetron (ZOFRAN) 4 MG tablet Take 1 tablet (4 mg total) by mouth every 8 (eight) hours as needed for nausea. 03/19/13  Yes Venita Lick, MD  polyethylene glycol powder (GLYCOLAX) powder Take 17 g by mouth daily. 03/19/13  Yes Venita Lick, MD  Pyridoxine HCl (VITAMIN B-6) 500 MG tablet Take 500 mg by mouth daily.   Yes Historical Provider, MD  simvastatin (ZOCOR) 20 MG tablet Take 10 mg by mouth every evening.   Yes Historical Provider, MD  tamsulosin (FLOMAX) 0.4 MG CAPS Take 0.4 mg by mouth daily.   Yes Historical Provider, MD    No Known Allergies  FAMILY HISTORY:  Family History  Problem Relation Age of Onset  . Diabetes Mother   . Stroke Brother     x2   SOCIAL HISTORY:  reports that he has quit smoking. He has never used smokeless tobacco. He reports that he does not drink alcohol or use illicit drugs.  REVIEW OF SYSTEMS:  Unattainable, patient is encephalopathic.  SUBJECTIVE: Confused, no complaints.  VITAL SIGNS: Temp:  [99.8 F (37.7 C)] 99.8 F (37.7 C) (08/25 1525) Pulse Rate:  [83-101] 95 (08/25 1800) Resp:  [17-37] 24 (08/25 1800) BP: (69-116)/(26-79) 94/58 mmHg (08/25 1800) SpO2:  [92 %-99 %] 93 % (08/25 1800) HEMODYNAMICS:   VENTILATOR SETTINGS:   INTAKE / OUTPUT: Intake/Output   None     PHYSICAL EXAMINATION: General:  Chronically ill appearing male. Neuro:  Awake but confused, follows simple commands, moves all ext to command. HEENT:  Man/AT, PERRL, EOM-I and DMM. Cardiovascular:  RRR, Nl S1/S2, -M/R/G. Lungs:  CTA bilaterally. Abdomen:  Soft, NT, ND and +BS. Musculoskeletal:  2+ edema on both legs and 1+ edema on the right arm, normal left arm. Skin:  Multiple bruises, back with erythema, hot and tender around wound site.  LABS:  CBC Recent Labs     04/11/13  1512  04/11/13  1545  WBC  7.9   --   HGB  8.0*  7.8*  HCT  24.2*  23.0*  PLT  91*   --    Coag's Recent Labs     04/11/13  1512  INR  1.87*   BMET Recent Labs     04/11/13  1512  04/11/13  1545  NA  141  143  K  3.6  3.5  CL  108  107  CO2  22   --   BUN  43*  37*  CREATININE  1.04  1.20  GLUCOSE  66*  66*   Electrolytes Recent Labs     04/11/13  1512  CALCIUM  9.5   Sepsis Markers No results found for this basename: LACTICACIDVEN, PROCALCITON, O2SATVEN,  in the last 72 hours ABG No results found for this basename: PHART, PCO2ART, PO2ART,  in the last 72 hours Liver Enzymes Recent Labs     04/11/13  1512  AST  46*  ALT  25  ALKPHOS  83  BILITOT  2.9*  ALBUMIN  1.3*   Cardiac  Enzymes No results found for this basename: TROPONINI, PROBNP,  in the last 72 hours Glucose Recent Labs     04/11/13  1533  GLUCAP  69*    Imaging Dg Chest Portable 1 View  04/11/2013   *RADIOLOGY REPORT*  Clinical Data: Short of breath, lung infection  PORTABLE CHEST - 1 VIEW  Comparison: Prior chest x-ray 04/04/2013  Findings: Right  upper extremity PICC.  The catheter tip projects over the superior cavoatrial junction.  Stable cardiac and mediastinal contours.  Continued left retrocardiac opacity and probable small left pleural effusion.  Stable pulmonary vascular congestion without overt edema and linear right basilar atelectasis.  IMPRESSION: No significant interval change in the appearance of the chest compared to 04/04/2013.  Persistent left greater than right basilar atelectasis versus consolidation.  The tip of the right upper extremity PICC projects over the superior cavoatrial junction.   Original Report Authenticated By: Malachy Moan, M.D.     CXR: Atelectasis R>L.  ASSESSMENT / PLAN:  PULMONARY A: No evidence of respiratory failure at this point but bibasilar edema noted. P:   - Titrate o2 for sats. - IS per RT protocol. - Careful with fluid due to history of CHF.  CARDIOVASCULAR A: Septic shock due to wound. P:  - Sepsis protocol. - IVF. - Place TLC. - Pressors as needed.  RENAL A:  Hypokalemia. P:   - Replace electrolytes. - Volume resuscitation. - BMET in AM.  GASTROINTESTINAL A:  No active issues.  P:   - Heart healthy diet when more stable.  HEMATOLOGIC A:  No active issues.  Hg of 7.8. P:  - Transfuse for Hg of 7.  INFECTIOUS A:  Cellulitis at the surgical site on the back.  Pan sensitive Serratia in the past.  Osteo. P:   - Sepsis protocol. - F/U on cultures. - Vanc/zosyn. - Ortho called to address. - PICC line removed.  ENDOCRINE A:  DM.   P:   - CBGs. - ISS. - Check cortisol. - Hydrocortisone for stress dose.  NEUROLOGIC A:   Confusion likely due to sepsis.  Easily re-oriented. P:   - Minimize sedation.  TODAY'S SUMMARY: 79 year old male with cellulitis and osteo induced septic shock.  No airway or respiratory concerns.  Admit to the ICU, sepsis protocol, broad spectrum abx.  I have personally obtained a history, examined the patient, evaluated laboratory and imaging results, formulated the assessment and plan and placed orders.  CRITICAL CARE: The patient is critically ill with multiple organ systems failure and requires high complexity decision making for assessment and support, frequent evaluation and titration of therapies, application of advanced monitoring technologies and extensive interpretation of multiple databases. Critical Care Time devoted to patient care services described in this note is 40 minutes.   Alyson Reedy, M.D. Pulmonary and Critical Care Medicine [facility] Pager: [phone]  04/11/2013, 6:14 PM

## 2013-04-12 ENCOUNTER — Inpatient Hospital Stay (HOSPITAL_COMMUNITY): Payer: Medicare Other

## 2013-04-12 DIAGNOSIS — R609 Edema, unspecified: Secondary | ICD-10-CM

## 2013-04-12 DIAGNOSIS — I2510 Atherosclerotic heart disease of native coronary artery without angina pectoris: Secondary | ICD-10-CM

## 2013-04-12 DIAGNOSIS — J9600 Acute respiratory failure, unspecified whether with hypoxia or hypercapnia: Secondary | ICD-10-CM

## 2013-04-12 LAB — BLOOD GAS, ARTERIAL
Bicarbonate: 21.7 mEq/L (ref 20.0–24.0)
Patient temperature: 98.6
TCO2: 22.7 mmol/L (ref 0–100)
pCO2 arterial: 31 mmHg — ABNORMAL LOW (ref 35.0–45.0)
pH, Arterial: 7.46 — ABNORMAL HIGH (ref 7.350–7.450)

## 2013-04-12 LAB — URINE CULTURE: Colony Count: NO GROWTH

## 2013-04-12 LAB — BASIC METABOLIC PANEL
CO2: 24 mEq/L (ref 19–32)
Calcium: 8.7 mg/dL (ref 8.4–10.5)
Chloride: 112 mEq/L (ref 96–112)
Sodium: 142 mEq/L (ref 135–145)

## 2013-04-12 LAB — PROCALCITONIN: Procalcitonin: 1.6 ng/mL

## 2013-04-12 LAB — CBC
Platelets: 90 10*3/uL — ABNORMAL LOW (ref 150–400)
RBC: 2.49 MIL/uL — ABNORMAL LOW (ref 4.22–5.81)
WBC: 5.7 10*3/uL (ref 4.0–10.5)

## 2013-04-12 LAB — GLUCOSE, CAPILLARY: Glucose-Capillary: 106 mg/dL — ABNORMAL HIGH (ref 70–99)

## 2013-04-12 LAB — MAGNESIUM: Magnesium: 1.6 mg/dL (ref 1.5–2.5)

## 2013-04-12 LAB — PHOSPHORUS: Phosphorus: 3.2 mg/dL (ref 2.3–4.6)

## 2013-04-12 MED ORDER — BIOTENE DRY MOUTH MT LIQD
15.0000 mL | Freq: Two times a day (BID) | OROMUCOSAL | Status: DC
  Administered 2013-04-13: 15 mL via OROMUCOSAL

## 2013-04-12 MED ORDER — CHLORHEXIDINE GLUCONATE 0.12 % MT SOLN
15.0000 mL | Freq: Two times a day (BID) | OROMUCOSAL | Status: DC
  Administered 2013-04-12 – 2013-04-14 (×5): 15 mL via OROMUCOSAL
  Filled 2013-04-12 (×5): qty 15

## 2013-04-12 NOTE — Care Management Note (Addendum)
    Page 1 of 2   04/15/2013     1:27:58 PM   CARE MANAGEMENT NOTE 04/15/2013  Patient:  [name] [name],[name] [name]   Account Number:  [id number]  Date Initiated:  04/12/2013  Documentation initiated by:  Junius Creamer  Subjective/Objective Assessment:   adm w sepsis     Action/Plan:   lives w wife   Anticipated DC Date:  04/18/2013   Anticipated DC Plan:  LONG TERM ACUTE CARE (LTAC)  In-house referral  Clinical Social Worker      DC Planning Services  CM consult      Choice offered to / List presented to:             Status of service:  Completed, signed off Medicare Important Message given?   (If response is "NO", the following Medicare IM given date fields will be blank) Date Medicare IM given:   Date Additional Medicare IM given:    Discharge Disposition:  LONG TERM ACUTE CARE (LTAC)  Per UR Regulation:  Reviewed for med. necessity/level of care/duration of stay  If discussed at Long Length of Stay Meetings, dates discussed:    Comments:  04-15-13 12 noon Avie Arenas, RNBSN [phone] Talked with patient and son in room and daughter about [facility]ach options as physician ok with discharge to [facility] today.  [facility] given - [facility]  [facility]. Plan for [facility] today.  04-14-13 10am Avie Arenas, RNBSN  [phone] I&D and wound VAC placement on 04-13-13.  ?? [facility]ach candidate - .  Patient does live with wife prior to admission but he is the caregiver as spouse has dementia.  Daughter is now with mother.  Son is here from [place].  After consult with physician - Ltach would be a good option for patient, but is not ready now as plan for IVC filter for R arm SVT and B calf DVT.  CM will continue to follow.  8/26 0845 debbie dowell rn,bsn per recent adm pt went to snf on 8/21. hx w [facility]. lived w wife at [facility] prior to last adm.

## 2013-04-12 NOTE — Significant Event (Signed)
Spoke with pt's daughter. Explained current status of his illness, and current treatment plan.  Coralyn Helling, MD [facility] Pulmonary/Critical Care 04/12/2013, 1:07 PM Pager:  [phone] After 3pm call: [phone]

## 2013-04-12 NOTE — Clinical Social Work Placement (Signed)
Clinical Social Work Department CLINICAL SOCIAL WORK PLACEMENT NOTE 04/12/2013  Patient:  [name] [name], [name] [name]  Account Number:  [id number] Admit date:  04/11/2013  Clinical Social Worker:  Salomon Fick, LCSW  Date/time:  04/12/2013 04:00 PM  Clinical Social Work is seeking post-discharge placement for this patient at the following level of care:   SKILLED NURSING   (*CSW will update this form in Epic as items are completed)   04/12/2013  Patient/family provided with [facility] Department of Clinical Social Work's list of facilities offering this level of care within the geographic area requested by the patient (or if unable, by the patient's family).  04/12/2013  Patient/family informed of their freedom to choose among providers that offer the needed level of care, that participate in Medicare, Medicaid or managed care program needed by the patient, have an available bed and are willing to accept the patient.  04/12/2013  Patient/family informed of [facility]' ownership interest in [facility], as well as of the fact that they are under no obligation to receive care at this facility.  PASARR submitted to EDS on  PASARR number received from EDS on   FL2 transmitted to all facilities in geographic area requested by pt/family on  04/12/2013 FL2 transmitted to all facilities within larger geographic area on 04/12/2013  Patient informed that his/her managed care company has contracts with or will negotiate with  certain facilities, including the following:     Patient/family informed of bed offers received:   Patient chooses bed at  Physician recommends and patient chooses bed at    Patient to be transferred to  on   Patient to be transferred to facility by   The following physician request were entered in Epic:   Additional Comments:

## 2013-04-12 NOTE — Clinical Social Work Psychosocial (Signed)
Clinical Social Work Department BRIEF PSYCHOSOCIAL ASSESSMENT 04/12/2013  Patient:  Anzalone,Vinod     Account Number:  [id number]     Admit date:  04/11/2013  Clinical Social Worker:  Madaline Guthrie  Date/Time:  04/12/2013 04:00 PM  Referred by:  Care Management  Date Referred:  04/12/2013 Referred for  SNF Placement   Other Referral:   Interview type:  Family Other interview type:    PSYCHOSOCIAL DATA Living Status:  FACILITY Admitted from facility:  [facility] Level of care:  Skilled Nursing Facility Primary support name:  Alona Bene [phone] Primary support relationship to patient:  CHILD, ADULT Degree of support available:   good    CURRENT CONCERNS Current Concerns  Post-Acute Placement   Other Concerns:    SOCIAL WORK ASSESSMENT / PLAN Pt was admitted from [facility].  CSW talked to pt's daughter in [place] who talked about being very upset about the course of pt's health status since he elected to have back surgery a month ago.  Pt was sole caregiver of his wife who has dementia.  Pt's wife is now living with daughter who has limited space and children at home.  The family has a great deal of stress and daughter is very worried about pt.  She did not secure a bed hold at [facility] and is not sure where she would like pt to rehab so CSW will fax FL2 to area SNFs.  Daughter voiced a need for support and plans to contact [facility] in the area to assit her.    CSW provided support during conversation and will follow to address additional needs.   Assessment/plan status:  Psychosocial Support/Ongoing Assessment of Needs Other assessment/ plan:   Information/referral to community resources:    PATIENT'S/FAMILY'S RESPONSE TO PLAN OF CARE: Family was appreciative of CSW assistance and support.

## 2013-04-12 NOTE — Progress Notes (Signed)
PULMONARY  / CRITICAL CARE MEDICINE  Name: [name] [name] MRN: [id number] DOB: Oct 05, 1933    ADMISSION DATE:  04/11/2013 CONSULTATION DATE:  04/11/2013  REFERRING MD :  EDP PRIMARY SERVICE: PCCM  CHIEF COMPLAINT:  AMS and hypotension.  BRIEF PATIENT DESCRIPTION:  79 yo male s/p T9 to L3 fusion on 7/30 complicated by wound drainage and hardware migration.  Had hardware removal and I&D, and transferred to SNF.  Developed recurrent fever and drainage with hypotension and sent to ER 8/25.  PCCM asked to admit to ICU with severe sepsis.  SIGNIFICANT EVENTS: 8/25 Admit to ICU with severe sepsis, ortho consulted  STUDIES:  8/25 Rt arm doppler >>   LINES / TUBES: Rt arm PICC >> 8/25 PIV  CULTURES: Back wound 8/13 >> Serratia Blood 8/25>>> Sputum 8/25>>> Urine 8/25>>>  ANTIBIOTICS: Vanc 8/25>>> Zosyn 8/25>>>  SUBJECTIVE:  Obtunded.  VITAL SIGNS: Temp:  [98 F (36.7 C)-99.8 F (37.7 C)] 98 F (36.7 C) (08/26 0829) Pulse Rate:  [35-125] 72 (08/26 0900) Resp:  [17-37] 22 (08/26 0900) BP: (69-143)/(26-79) 93/53 mmHg (08/26 0900) SpO2:  [92 %-100 %] 100 % (08/26 0900) Weight:  [207 lb 3.7 oz (94 kg)-207 lb 10.8 oz (94.2 kg)] 207 lb 10.8 oz (94.2 kg) (08/26 0500) 2 liters nasal cannula  INTAKE / OUTPUT: Intake/Output     08/25 0701 - 08/26 0700 08/26 0701 - 08/27 0700   I.V. (mL/kg) 750 (8) 300 (3.2)   IV Piggyback 550 25   Total Intake(mL/kg) 1300 (13.8) 325 (3.5)   Urine (mL/kg/hr) 1095 100 (0.5)   Total Output 1095 100   Net +205 +225          PHYSICAL EXAMINATION: General: Ill appearing Neuro: Obtunded, moans with stimulation HEENT: Pale sclera, dry oral mucosa Cardiovascular: regular, no murmur Lungs: decreased breath sounds, scattered rhonchi Abdomen: soft, non tender, decreased bowel sounds Musculoskeletal:  1+ Rt arm edema, 1+ b/l lower extremity edema Skin:  Multiple bruises, back with erythema, hot and tender around wound site.  LABS:  CBC Recent  Labs     04/11/13  1512  04/11/13  1545  04/11/13  2022  04/12/13  0410  WBC  7.9   --   7.0  5.7  HGB  8.0*  7.8*  7.3*  7.5*  HCT  24.2*  23.0*  21.9*  23.3*  PLT  91*   --   84*  90*   Coag's Recent Labs     04/11/13  1512  04/11/13  2022  APTT   --   33  INR  1.87*  1.90*   BMET Recent Labs     04/11/13  1512  04/11/13  1545  04/11/13  2022  04/12/13  0410  NA  141  143  143  142  K  3.6  3.5  3.3*  4.0  CL  108  107  112  112  CO2  22   --   22  24  BUN  43*  37*  38*  36*  CREATININE  1.04  1.20  0.98  0.94  GLUCOSE  66*  66*  87  102*   Electrolytes Recent Labs     04/11/13  1512  04/11/13  2022  04/12/13  0410  CALCIUM  9.5  8.7  8.7  MG   --    --   1.6  PHOS   --    --   3.2   Sepsis Markers No results  found for this basename: LACTICACIDVEN, PROCALCITON, O2SATVEN,  in the last 72 hours  ABG No results found for this basename: PHART, PCO2ART, PO2ART,  in the last 72 hours  Liver Enzymes Recent Labs     04/11/13  1512  04/11/13  2022  AST  46*  37  ALT  25  23  ALKPHOS  83  72  BILITOT  2.9*  2.4*  ALBUMIN  1.3*  1.2*   Cardiac Enzymes Recent Labs     04/11/13  2022  TROPONINI  <0.30   Glucose Recent Labs     04/11/13  1824  04/11/13  1853  04/11/13  2002  04/11/13  2329  04/12/13  0439  04/12/13  0759  GLUCAP  86  77  87  91  106*  119*    Imaging Dg Chest Port 1 View  04/12/2013   *RADIOLOGY REPORT*  Clinical Data: Respiratory failure  PORTABLE CHEST - 1 VIEW  Comparison: Prior chest x-ray 04/11/2013  Findings: Persistent very low lung volumes with slightly increased pulmonary vascular congestion and pulmonary edema.  Probable small left greater than right layering pleural effusions.  Cardiomegaly is similar compared to prior.  Atherosclerotic calcifications noted in the transverse and descending thoracic aorta.  The patient is status post median sternotomy.  Linear calcification in the left upper quadrant is likely peri  splenic after correlation with prior CT imaging.  No pneumothorax or acute osseous abnormality.  IMPRESSION:  1.  Worsening pulmonary edema. 2.  Small bilateral layering pleural effusions with associated atelectasis versus infiltrate. 3.  Inspiratory volumes remain very low.   Original Report Authenticated By: Malachy Moan, M.D.   Dg Chest Portable 1 View  04/11/2013   *RADIOLOGY REPORT*  Clinical Data: Sepsis  PORTABLE CHEST - 1 VIEW  Comparison: 04/11/2013; 04/04/2013  Findings: Grossly unchanged enlarged cardiac silhouette and mediastinal contours post median sternotomy given persistently reduced lung volumes.  Atherosclerotic calcifications within the thoracic aorta. Interval removal of right upper extremity approach PICC line.  The pulmonary vasculature is less distinct on the present examination.  Worsening bibasilar heterogeneous and consolidative opacities, left greater than right.  Trace bilateral effusions are not excluded.  No pneumothorax.  Unchanged bones.  IMPRESSION: 1.  Decreased lung volumes with findings suggestive of pulmonary edema and worsening bibasilar opacities, left greater than right, atelectasis versus infiltrate. 2.  Interval removal of PICC line.   Original Report Authenticated By: Tacey Ruiz, MD   Dg Chest Portable 1 View  04/11/2013   *RADIOLOGY REPORT*  Clinical Data: Short of breath, lung infection  PORTABLE CHEST - 1 VIEW  Comparison: Prior chest x-ray 04/04/2013  Findings: Right upper extremity PICC.  The catheter tip projects over the superior cavoatrial junction.  Stable cardiac and mediastinal contours.  Continued left retrocardiac opacity and probable small left pleural effusion.  Stable pulmonary vascular congestion without overt edema and linear right basilar atelectasis.  IMPRESSION: No significant interval change in the appearance of the chest compared to 04/04/2013.  Persistent left greater than right basilar atelectasis versus consolidation.  The tip of the  right upper extremity PICC projects over the superior cavoatrial junction.   Original Report Authenticated By: Malachy Moan, M.D.    ASSESSMENT / PLAN:  PULMONARY A: Acute hypoxic respiratory failure 2nd to pulmonary edema versus ALI and atelectasis. P:   -oxygen to keep SpO2 > 92%   -check ABG -f/u CXR  CARDIOVASCULAR A: Severe sepsis. Hx of HTN, CAD, hyperlipidemia. Rt arm swelling.  P:  -continue IV fluids >> decrease to 75 ml/hr -may need CVL to assist with fluid management, and if he needs pressors -hold outpt cozaar, zocor -f/u Rt arm doppler  RENAL A: Hypokalemia. Hx of BPH. P:   -monitor renal fx, urine outpt, electrolytes -hold outpt flomax -keep foley in for now  GASTROINTESTINAL A:  Nutrition. P:   -NPO due to mental status  HEMATOLOGIC A: Anemia of critical illness and chronic disease. P:  -f/u CBC -transfuse for Hb < 7 -SQ heparin for DVT prevention  INFECTIOUS A: Thoracic/lumbar spine wound infection.  Cx from 8/13 with Serratia, and was on rocephin as outpt. P:   -D2/x vancomycin, zosyn -ortho planning for repeat I&D in OR -f/u Cx results  ENDOCRINE A:  Hx of DM type II.   Relative adrenal insufficiency >> 8/25 cortisol >> 16.1 P:   -continue stress dose solu-cortef -SSI -hold outpt glucotrol, glucophage  NEUROLOGIC A: Acute encephalopathy 2nd to sepsis. P:   -minimize sedatives, narcotics -monitor mental status  CC time 40 minutes  Coralyn Helling, MD [facility] Pulmonary/Critical Care 04/12/2013, 9:44 AM Pager:  [phone] After 3pm call: [phone]

## 2013-04-12 NOTE — Progress Notes (Signed)
Patient ID: [name] [name], male   DOB: Jan 01, 1934, 79 y.o.   MRN: [id number]   I just had a phone conversation with Dr Sherene Sires Pulm/Critical care and he stated that [name] is cleared to have thoracolumbar I&D and wound VAC placement tomorrow.  I discussed with Dr Shon Baton.

## 2013-04-12 NOTE — Progress Notes (Signed)
    Subjective:      Patient reports pain as 3 on 0-10 scale.  Reports deferred at this time leg pain reports incisional back pain   N/A void Negative bowel movement Positive flatus Negative chest pain or shortness of breath  Objective: Vital signs in last 24 hours: Temp:  [98.4 F (36.9 C)-99.8 F (37.7 C)] 98.8 F (37.1 C) (08/26 0400) Pulse Rate:  [35-125] 74 (08/26 0700) Resp:  [17-37] 23 (08/26 0700) BP: (69-128)/(26-79) 126/56 mmHg (08/26 0700) SpO2:  [92 %-100 %] 100 % (08/26 0700) Weight:  [94 kg (207 lb 3.7 oz)-94.2 kg (207 lb 10.8 oz)] 94.2 kg (207 lb 10.8 oz) (08/26 0500)  Intake/Output from previous day: 08/25 0701 - 08/26 0700 In: 1300 [I.V.:750; IV Piggyback:550] Out: 1095 [Urine:1095]  Labs:  Recent Labs  04/11/13 2022 04/12/13 0410  WBC 7.0 5.7  RBC 2.38* 2.49*  HCT 21.9* 23.3*  PLT 84* 90*    Recent Labs  04/11/13 2022 04/12/13 0410  NA 143 142  K 3.3* 4.0  CL 112 112  CO2 22 24  BUN 38* 36*  CREATININE 0.98 0.94  GLUCOSE 87 102*  CALCIUM 8.7 8.7    Recent Labs  04/11/13 1512 04/11/13 2022  INR 1.87* 1.90*    Physical Exam: Neurologically intact Intact pulses distally Incision: moderate drainage and necrotic areas at wound edges Compartment soft  Assessment/Plan: Patient stable  xrays n/a  Continue mobilization with physical therapy Continue care  Wound has worsened since discharge on 8/21. Increase in drainage  Recommend return to OR for formal I+D.  Will most likely leave caudal portion of the wound open and packed with a wound vac.  Plan on return to OR Wednesday if medically stable.  HCT/PLT count low, INR is elevated.   Will discuss with CCM team.  Venita Lick, MD [facility] [phone]

## 2013-04-12 NOTE — Progress Notes (Signed)
*  PRELIMINARY RESULTS* Vascular Ultrasound Right upper extremity venous duplex has been completed.  Preliminary findings: Right =  Positive for DVT involving a single brachial vein at the proximal to mid upper arm level. Superficial thrombosis noted in the basilic vein.   Farrel Demark, RDMS, RVT  04/12/2013, 10:42 AM

## 2013-04-13 ENCOUNTER — Inpatient Hospital Stay (HOSPITAL_COMMUNITY): Payer: Medicare Other | Admitting: Anesthesiology

## 2013-04-13 ENCOUNTER — Inpatient Hospital Stay (HOSPITAL_COMMUNITY): Payer: Medicare Other

## 2013-04-13 ENCOUNTER — Encounter (HOSPITAL_COMMUNITY): Payer: Self-pay | Admitting: Anesthesiology

## 2013-04-13 ENCOUNTER — Encounter (HOSPITAL_COMMUNITY)
Admission: EM | Disposition: A | Discharge status: Other Acute Inpatient Hospital | Payer: Self-pay | Source: Home / Self Care | Attending: Pulmonary Disease

## 2013-04-13 HISTORY — PX: INCISION AND DRAINAGE OF WOUND: SHX1803

## 2013-04-13 LAB — CBC
HCT: 24.8 % — ABNORMAL LOW (ref 39.0–52.0)
HCT: 21.1 % — ABNORMAL LOW (ref 39.0–52.0)
Hemoglobin: 8 g/dL — ABNORMAL LOW (ref 13.0–17.0)
Hemoglobin: 7 g/dL — ABNORMAL LOW (ref 13.0–17.0)
MCH: 30.9 pg (ref 26.0–34.0)
MCH: 30.1 pg (ref 26.0–34.0)
MCH: 30.3 pg (ref 26.0–34.0)
MCHC: 32.3 g/dL (ref 30.0–36.0)
MCHC: 33.2 g/dL (ref 30.0–36.0)
MCV: 94.8 fL (ref 78.0–100.0)
MCV: 93.2 fL (ref 78.0–100.0)
MCV: 91.3 fL (ref 78.0–100.0)
Platelets: 106 10*3/uL — ABNORMAL LOW (ref 150–400)
RBC: 2.31 MIL/uL — ABNORMAL LOW (ref 4.22–5.81)
RDW: 15.1 % (ref 11.5–15.5)
WBC: 4.5 10*3/uL (ref 4.0–10.5)

## 2013-04-13 LAB — PROTIME-INR
INR: 1.23 (ref 0.00–1.49)
Prothrombin Time: 15.2 seconds (ref 11.6–15.2)

## 2013-04-13 LAB — BASIC METABOLIC PANEL
BUN: 37 mg/dL — ABNORMAL HIGH (ref 6–23)
Calcium: 8.2 mg/dL — ABNORMAL LOW (ref 8.4–10.5)
Chloride: 114 mEq/L — ABNORMAL HIGH (ref 96–112)
Creatinine, Ser: 1.71 mg/dL — ABNORMAL HIGH (ref 0.50–1.35)
GFR calc Af Amer: 43 mL/min — ABNORMAL LOW (ref >90)
Glucose, Bld: 149 mg/dL — ABNORMAL HIGH (ref 70–99)
Potassium: 3.1 mEq/L — ABNORMAL LOW (ref 3.5–5.1)
Sodium: 143 mEq/L (ref 135–145)

## 2013-04-13 LAB — GLUCOSE, CAPILLARY
Glucose-Capillary: 159 mg/dL — ABNORMAL HIGH (ref 70–99)
Glucose-Capillary: 119 mg/dL — ABNORMAL HIGH (ref 70–99)

## 2013-04-13 SURGERY — IRRIGATION AND DEBRIDEMENT WOUND
Anesthesia: General | Site: Back | Wound class: Dirty or Infected

## 2013-04-13 MED ORDER — VANCOMYCIN HCL IN DEXTROSE 750-5 MG/150ML-% IV SOLN
750.0000 mg | Freq: Two times a day (BID) | INTRAVENOUS | Status: DC
  Filled 2013-04-13: qty 150

## 2013-04-13 MED ORDER — ONDANSETRON HCL 4 MG/2ML IJ SOLN: 4.0000 mg | INTRAMUSCULAR | Status: DC | PRN

## 2013-04-13 MED ORDER — ALBUMIN HUMAN 5 % IV SOLN
INTRAVENOUS | Status: DC | PRN
  Administered 2013-04-13 (×2): via INTRAVENOUS

## 2013-04-13 MED ORDER — SUCCINYLCHOLINE CHLORIDE 20 MG/ML IJ SOLN
INTRAMUSCULAR | Status: DC | PRN
  Administered 2013-04-13: 100 mg via INTRAVENOUS

## 2013-04-13 MED ORDER — MORPHINE SULFATE 2 MG/ML IJ SOLN: 1.0000 mg | INTRAMUSCULAR | Status: DC | PRN

## 2013-04-13 MED ORDER — METHOCARBAMOL 500 MG PO TABS
500.0000 mg | ORAL_TABLET | Freq: Four times a day (QID) | ORAL | Status: DC | PRN
  Filled 2013-04-13: qty 1

## 2013-04-13 MED ORDER — SODIUM CHLORIDE 0.9 % IV SOLN
250.0000 mL | INTRAVENOUS | Status: DC
  Administered 2013-04-13: 250 mL via INTRAVENOUS

## 2013-04-13 MED ORDER — PHENOL 1.4 % MT LIQD: 1.0000 | OROMUCOSAL | Status: DC | PRN

## 2013-04-13 MED ORDER — ACETAMINOPHEN 650 MG RE SUPP: 650.0000 mg | RECTAL | Status: DC | PRN

## 2013-04-13 MED ORDER — HYDROCORTISONE SOD SUCCINATE 100 MG IJ SOLR
50.0000 mg | Freq: Two times a day (BID) | INTRAMUSCULAR | Status: DC
  Filled 2013-04-13 (×2): qty 1

## 2013-04-13 MED ORDER — TAMSULOSIN HCL 0.4 MG PO CAPS
0.4000 mg | ORAL_CAPSULE | Freq: Every day | ORAL | Status: DC
  Administered 2013-04-13 – 2013-04-15 (×3): 0.4 mg via ORAL
  Filled 2013-04-13 (×3): qty 1

## 2013-04-13 MED ORDER — OXYCODONE HCL 5 MG PO TABS: 10.0000 mg | ORAL_TABLET | ORAL | Status: DC | PRN

## 2013-04-13 MED ORDER — ENOXAPARIN SODIUM 30 MG/0.3ML ~~LOC~~ SOLN
30.0000 mg | SUBCUTANEOUS | Status: DC
  Administered 2013-04-14: 30 mg via SUBCUTANEOUS
  Filled 2013-04-13 (×2): qty 0.3

## 2013-04-13 MED ORDER — MENTHOL 3 MG MT LOZG
1.0000 | LOZENGE | OROMUCOSAL | Status: DC | PRN
  Filled 2013-04-13: qty 9

## 2013-04-13 MED ORDER — METHOCARBAMOL 100 MG/ML IJ SOLN
500.0000 mg | Freq: Four times a day (QID) | INTRAVENOUS | Status: DC | PRN
  Filled 2013-04-13: qty 5

## 2013-04-13 MED ORDER — ONDANSETRON HCL 4 MG/2ML IJ SOLN
INTRAMUSCULAR | Status: DC | PRN
  Administered 2013-04-13: 4 mg via INTRAVENOUS

## 2013-04-13 MED ORDER — POTASSIUM CHLORIDE 10 MEQ/100ML IV SOLN: 10.0000 meq | INTRAVENOUS | Status: DC

## 2013-04-13 MED ORDER — FLEET ENEMA 7-19 GM/118ML RE ENEM
1.0000 | ENEMA | Freq: Once | RECTAL | Status: AC | PRN
  Filled 2013-04-13: qty 1

## 2013-04-13 MED ORDER — DOCUSATE SODIUM 100 MG PO CAPS
100.0000 mg | ORAL_CAPSULE | Freq: Two times a day (BID) | ORAL | Status: DC
  Administered 2013-04-13 – 2013-04-15 (×4): 100 mg via ORAL
  Filled 2013-04-13 (×5): qty 1

## 2013-04-13 MED ORDER — 0.9 % SODIUM CHLORIDE (POUR BTL) OPTIME
TOPICAL | Status: DC | PRN
  Administered 2013-04-13: 1000 mL

## 2013-04-13 MED ORDER — SODIUM CHLORIDE 0.9 % IJ SOLN
10.0000 mL | Freq: Two times a day (BID) | INTRAMUSCULAR | Status: DC
  Administered 2013-04-13: 10 mL

## 2013-04-13 MED ORDER — ACETAMINOPHEN 325 MG PO TABS: 650.0000 mg | ORAL_TABLET | ORAL | Status: DC | PRN

## 2013-04-13 MED ORDER — SODIUM CHLORIDE 0.9 % IJ SOLN: 3.0000 mL | INTRAMUSCULAR | Status: DC | PRN

## 2013-04-13 MED ORDER — LACTATED RINGERS IV SOLN
INTRAVENOUS | Status: DC
  Administered 2013-04-13: 85 mL/h via INTRAVENOUS
  Administered 2013-04-15: 02:00:00 via INTRAVENOUS

## 2013-04-13 MED ORDER — POLYETHYLENE GLYCOL 3350 17 GM/SCOOP PO POWD
17.0000 g | Freq: Every day | ORAL | Status: DC
  Filled 2013-04-13: qty 255

## 2013-04-13 MED ORDER — METFORMIN HCL 500 MG PO TABS
1000.0000 mg | ORAL_TABLET | Freq: Every day | ORAL | Status: DC
Start: 2013-04-14 — End: 2013-04-14
  Administered 2013-04-14: 1000 mg via ORAL
  Filled 2013-04-13 (×2): qty 2

## 2013-04-13 MED ORDER — SODIUM CHLORIDE 0.9 % IJ SOLN: 10.0000 mL | INTRAMUSCULAR | Status: DC | PRN

## 2013-04-13 MED ORDER — POTASSIUM CHLORIDE 10 MEQ/50ML IV SOLN: 10.0000 meq | INTRAVENOUS | Status: DC

## 2013-04-13 MED ORDER — BACITRACIN ZINC 500 UNIT/GM EX OINT
TOPICAL_OINTMENT | CUTANEOUS | Status: AC
  Filled 2013-04-13: qty 15

## 2013-04-13 MED ORDER — PHENYLEPHRINE HCL 10 MG/ML IJ SOLN
10.0000 mg | INTRAVENOUS | Status: DC | PRN
  Administered 2013-04-13: 100 ug/min via INTRAVENOUS

## 2013-04-13 MED ORDER — LIDOCAINE HCL (CARDIAC) 20 MG/ML IV SOLN
INTRAVENOUS | Status: DC | PRN
  Administered 2013-04-13: 100 mg via INTRAVENOUS

## 2013-04-13 MED ORDER — GLIPIZIDE 2.5 MG HALF TABLET
2.5000 mg | ORAL_TABLET | Freq: Every day | ORAL | Status: DC
  Administered 2013-04-13 – 2013-04-14 (×2): 2.5 mg via ORAL
  Filled 2013-04-13 (×2): qty 1

## 2013-04-13 MED ORDER — POLYETHYLENE GLYCOL 3350 17 G PO PACK
17.0000 g | PACK | Freq: Every day | ORAL | Status: DC
  Administered 2013-04-13 – 2013-04-15 (×3): 17 g via ORAL
  Filled 2013-04-13 (×3): qty 1

## 2013-04-13 MED ORDER — SODIUM CHLORIDE 0.9 % IJ SOLN
3.0000 mL | Freq: Two times a day (BID) | INTRAMUSCULAR | Status: DC
  Administered 2013-04-13 – 2013-04-14 (×3): 3 mL via INTRAVENOUS

## 2013-04-13 MED ORDER — SODIUM CHLORIDE 0.9 % IV SOLN: 250.0000 mL | INTRAVENOUS | Status: DC

## 2013-04-13 MED ORDER — FENTANYL CITRATE 0.05 MG/ML IJ SOLN
INTRAMUSCULAR | Status: DC | PRN
  Administered 2013-04-13 (×2): 100 ug via INTRAVENOUS

## 2013-04-13 MED ORDER — PHENYLEPHRINE HCL 10 MG/ML IJ SOLN
INTRAMUSCULAR | Status: DC | PRN
  Administered 2013-04-13 (×2): 80 ug via INTRAVENOUS
  Administered 2013-04-13: 120 ug via INTRAVENOUS
  Administered 2013-04-13: 80 ug via INTRAVENOUS

## 2013-04-13 MED ORDER — PIPERACILLIN-TAZOBACTAM 3.375 G IVPB
3.3750 g | Freq: Three times a day (TID) | INTRAVENOUS | Status: DC
  Administered 2013-04-13 – 2013-04-15 (×6): 3.375 g via INTRAVENOUS
  Filled 2013-04-13 (×8): qty 50

## 2013-04-13 MED ORDER — BRIMONIDINE TARTRATE 0.2 % OP SOLN
1.0000 [drp] | Freq: Two times a day (BID) | OPHTHALMIC | Status: DC
  Administered 2013-04-13 – 2013-04-15 (×4): 1 [drp] via OPHTHALMIC
  Filled 2013-04-13: qty 5

## 2013-04-13 MED ORDER — PROPOFOL 10 MG/ML IV BOLUS
INTRAVENOUS | Status: DC | PRN
  Administered 2013-04-13: 70 mg via INTRAVENOUS

## 2013-04-13 SURGICAL SUPPLY — 63 items
BLADE SURG 10 STRL SS (BLADE) ×2 IMPLANT
BUR EGG ELITE 4.0 (BURR) IMPLANT
CANISTER SUCTION 2500CC (MISCELLANEOUS) IMPLANT
CLOTH BEACON ORANGE TIMEOUT ST (SAFETY) ×2 IMPLANT
CONNECTOR Y ATS VAC SYSTEM (MISCELLANEOUS) ×2 IMPLANT
CORDS BIPOLAR (ELECTRODE) ×2 IMPLANT
COVER SURGICAL LIGHT HANDLE (MISCELLANEOUS) ×2 IMPLANT
DRAPE POUCH INSTRU U-SHP 10X18 (DRAPES) ×2 IMPLANT
DRAPE PROXIMA HALF (DRAPES) ×2 IMPLANT
DRAPE SURG 17X23 STRL (DRAPES) ×2 IMPLANT
DRAPE U-SHAPE 47X51 STRL (DRAPES) ×2 IMPLANT
DRESSING ALLEVYN LIFE SACRUM (GAUZE/BANDAGES/DRESSINGS) ×2 IMPLANT
DRSG MEPILEX BORDER 4X8 (GAUZE/BANDAGES/DRESSINGS) IMPLANT
DRSG VAC ATS MED SENSATRAC (GAUZE/BANDAGES/DRESSINGS) ×2 IMPLANT
DURAPREP 26ML APPLICATOR (WOUND CARE) IMPLANT
ELECT BLADE 4.0 EZ CLEAN MEGAD (MISCELLANEOUS)
ELECT CAUTERY BLADE 6.4 (BLADE) ×2 IMPLANT
ELECT REM PT RETURN 9FT ADLT (ELECTROSURGICAL) ×2
ELECTRODE BLDE 4.0 EZ CLN MEGD (MISCELLANEOUS) IMPLANT
ELECTRODE REM PT RTRN 9FT ADLT (ELECTROSURGICAL) ×1 IMPLANT
EVACUATOR 1/8 PVC DRAIN (DRAIN) IMPLANT
GLOVE BIOGEL PI IND STRL 6.5 (GLOVE) ×1 IMPLANT
GLOVE BIOGEL PI IND STRL 8.5 (GLOVE) ×2 IMPLANT
GLOVE BIOGEL PI INDICATOR 6.5 (GLOVE) ×1
GLOVE BIOGEL PI INDICATOR 8.5 (GLOVE) ×2
GLOVE ECLIPSE 6.0 STRL STRAW (GLOVE) IMPLANT
GLOVE ECLIPSE 8.5 STRL (GLOVE) ×2 IMPLANT
GOWN PREVENTION PLUS XXLARGE (GOWN DISPOSABLE) ×4 IMPLANT
GOWN STRL NON-REIN LRG LVL3 (GOWN DISPOSABLE) ×4 IMPLANT
HANDPIECE INTERPULSE COAX TIP (DISPOSABLE) ×1
IV NS IRRIG 3000ML ARTHROMATIC (IV SOLUTION) ×2 IMPLANT
KIT BASIN OR (CUSTOM PROCEDURE TRAY) ×2 IMPLANT
KIT ROOM TURNOVER OR (KITS) ×2 IMPLANT
LOOP VESSEL MAXI BLUE (MISCELLANEOUS) ×2 IMPLANT
MANIFOLD NEPTUNE WASTE (CANNULA) ×2 IMPLANT
NEEDLE 22X1 1/2 (OR ONLY) (NEEDLE) ×2 IMPLANT
NEEDLE SPNL 18GX3.5 QUINCKE PK (NEEDLE) ×4 IMPLANT
NS IRRIG 1000ML POUR BTL (IV SOLUTION) ×2 IMPLANT
PACK LAMINECTOMY ORTHO (CUSTOM PROCEDURE TRAY) ×2 IMPLANT
PACK UNIVERSAL I (CUSTOM PROCEDURE TRAY) ×2 IMPLANT
PAD ARMBOARD 7.5X6 YLW CONV (MISCELLANEOUS) ×4 IMPLANT
PAD NEG PRESSURE SENSATRAC (MISCELLANEOUS) ×2 IMPLANT
PATTIES SURGICAL .5 X.5 (GAUZE/BANDAGES/DRESSINGS) IMPLANT
PATTIES SURGICAL .5 X1 (DISPOSABLE) IMPLANT
SET HNDPC FAN SPRY TIP SCT (DISPOSABLE) ×1 IMPLANT
SPONGE LAP 18X18 X RAY DECT (DISPOSABLE) ×6 IMPLANT
SPONGE SURGIFOAM ABS GEL 100 (HEMOSTASIS) IMPLANT
STAPLER VISISTAT 35W (STAPLE) ×4 IMPLANT
STRIP CLOSURE SKIN 1/2X4 (GAUZE/BANDAGES/DRESSINGS) IMPLANT
SURGIFLO TRUKIT (HEMOSTASIS) IMPLANT
SUT MNCRL AB 3-0 PS2 18 (SUTURE) ×2 IMPLANT
SUT PROLENE 1 CT (SUTURE) ×12 IMPLANT
SUT VIC AB 0 CT1 27 (SUTURE) ×1
SUT VIC AB 0 CT1 27XBRD ANBCTR (SUTURE) ×1 IMPLANT
SUT VIC AB 1 CTX 36 (SUTURE) ×2
SUT VIC AB 1 CTX36XBRD ANBCTR (SUTURE) ×2 IMPLANT
SUT VIC AB 2-0 CT1 18 (SUTURE) IMPLANT
SYR BULB IRRIGATION 50ML (SYRINGE) ×2 IMPLANT
SYR CONTROL 10ML LL (SYRINGE) ×2 IMPLANT
TOWEL OR 17X24 6PK STRL BLUE (TOWEL DISPOSABLE) ×4 IMPLANT
TOWEL OR 17X26 10 PK STRL BLUE (TOWEL DISPOSABLE) ×2 IMPLANT
WATER STERILE IRR 1000ML POUR (IV SOLUTION) IMPLANT
YANKAUER SUCT BULB TIP NO VENT (SUCTIONS) ×2 IMPLANT

## 2013-04-13 NOTE — Preoperative (Signed)
Beta Blockers   Reason not to administer Beta Blockers:Not Applicable 

## 2013-04-13 NOTE — Progress Notes (Addendum)
ANTIBIOTIC CONSULT NOTE - Follow-Up  Pharmacy Consult for Vancomycin/zosyn Indication: sepsis/cellulitis  No Known Allergies  Patient Measurements: Height: 5\' 10"  (177.8 cm) Weight: 211 lb 6.7 oz (95.9 kg) IBW/kg (Calculated) : 73  Vital Signs: Temp: 97.4 F (36.3 C) (08/27 0819) Temp src: Oral (08/27 0819) BP: 117/57 mmHg (08/27 0700) Pulse Rate: 78 (08/27 0700) Intake/Output from previous day: 08/26 0701 - 08/27 0700 In: 2275 [I.V.:1950; IV Piggyback:325] Out: 935 [Urine:935] Intake/Output from this shift:    Labs:  Recent Labs  04/11/13 2022 04/12/13 0410 04/13/13 0400  WBC 7.0 5.7 4.5  HGB 7.3* 7.5* 9.0*  PLT 84* 90* 106*  CREATININE 0.98 0.94 1.71*   Estimated Creatinine Clearance: 42.7 ml/min (by C-G formula based on Cr of 1.71). No results found for this basename: VANCOTROUGH, VANCOPEAK, VANCORANDOM, GENTTROUGH, GENTPEAK, GENTRANDOM, TOBRATROUGH, TOBRAPEAK, TOBRARND, AMIKACINPEAK, AMIKACINTROU, AMIKACIN,  in the last 72 hours   Microbiology: Recent Results (from the past 720 hour(s))  ANAEROBIC CULTURE     Status: None   Collection Time    03/30/13  3:50 PM      Result Value Range Status   Specimen Description WOUND BACK   Final   Special Requests NONE   Final   Gram Stain     Final   Value: MODERATE WBC PRESENT,BOTH PMN AND MONONUCLEAR     NO SQUAMOUS EPITHELIAL CELLS SEEN     NO ORGANISMS SEEN     Performed at [facility]     Performed at [facility]   Culture     Final   Value: NO ANAEROBES ISOLATED     Performed at [facility]   Report Status 04/04/2013 FINAL   Final  WOUND CULTURE     Status: None   Collection Time    03/30/13  3:50 PM      Result Value Range Status   Specimen Description WOUND BACK   Final   Special Requests NONE   Final   Gram Stain     Final   Value: MODERATE WBC PRESENT,BOTH PMN AND MONONUCLEAR     NO SQUAMOUS EPITHELIAL CELLS SEEN     NO ORGANISMS SEEN     Performed at [facility]      Performed at [facility]   Culture     Final   Value: FEW SERRATIA MARCESCENS     Performed at [facility]   Report Status 04/02/2013 FINAL   Final   Organism ID, Bacteria SERRATIA MARCESCENS   Final  GRAM STAIN     Status: None   Collection Time    03/30/13  3:50 PM      Result Value Range Status   Specimen Description WOUND BACK   Final   Special Requests NONE   Final   Gram Stain     Final   Value: MODERATE WBC PRESENT,BOTH PMN AND MONONUCLEAR     NO ORGANISMS SEEN   Report Status 03/30/2013 FINAL   Final  MRSA PCR SCREENING     Status: None   Collection Time    03/30/13 10:34 PM      Result Value Range Status   MRSA by PCR NEGATIVE  NEGATIVE Final   Comment:            The GeneXpert MRSA Assay (FDA     approved for NASAL specimens     only), is one component of a     comprehensive MRSA colonization  surveillance program. It is not     intended to diagnose MRSA     infection nor to guide or     monitor treatment for     MRSA infections.  URINE CULTURE     Status: None   Collection Time    03/31/13 11:17 PM      Result Value Range Status   Specimen Description URINE, CATHETERIZED   Final   Special Requests CX ADDED AT [id number]   Final   Culture  Setup Time     Final   Value: 04/01/2013 00:58     Performed at [facility]   Colony Count     Final   Value: NO GROWTH     Performed at [facility]   Culture     Final   Value: NO GROWTH     Performed at [facility]   Report Status 04/02/2013 FINAL   Final  CULTURE, BLOOD (ROUTINE X 2)     Status: None   Collection Time    04/04/13  9:20 AM      Result Value Range Status   Specimen Description BLOOD RIGHT ANTECUBITAL   Final   Special Requests BOTTLES DRAWN AEROBIC ONLY 10CC   Final   Culture  Setup Time     Final   Value: 04/04/2013 16:48     Performed at [facility]   Culture     Final   Value: NO GROWTH 5 DAYS     Performed at [facility]    Report Status 04/10/2013 FINAL   Final  CULTURE, BLOOD (ROUTINE X 2)     Status: None   Collection Time    04/04/13  9:50 AM      Result Value Range Status   Specimen Description BLOOD HAND RIGHT   Final   Special Requests BOTTLES DRAWN AEROBIC ONLY 10CC   Final   Culture  Setup Time     Final   Value: 04/04/2013 16:48     Performed at [facility]   Culture     Final   Value: NO GROWTH 5 DAYS     Performed at [facility]   Report Status 04/10/2013 FINAL   Final  CULTURE, BLOOD (ROUTINE X 2)     Status: None   Collection Time    04/11/13  3:18 PM      Result Value Range Status   Specimen Description BLOOD HAND LEFT   Final   Special Requests BOTTLES DRAWN AEROBIC ONLY 10CC   Final   Culture  Setup Time     Final   Value: 04/11/2013 23:11     Performed at [facility]   Culture     Final   Value:        BLOOD CULTURE RECEIVED NO GROWTH TO DATE CULTURE WILL BE HELD FOR 5 DAYS BEFORE ISSUING A FINAL NEGATIVE REPORT     Performed at [facility]   Report Status PENDING   Incomplete  CULTURE, BLOOD (ROUTINE X 2)     Status: None   Collection Time    04/11/13  3:40 PM      Result Value Range Status   Specimen Description BLOOD HAND LEFT   Final   Special Requests BOTTLES DRAWN AEROBIC AND ANAEROBIC 10CC   Final   Culture  Setup Time     Final   Value: 04/11/2013 23:12     Performed at [facility]   Culture     Final   Value:        BLOOD CULTURE RECEIVED NO GROWTH TO DATE CULTURE WILL BE HELD FOR 5 DAYS BEFORE ISSUING A FINAL NEGATIVE REPORT     Performed at [facility]   Report Status PENDING   Incomplete  URINE CULTURE     Status: None   Collection Time    04/11/13  3:51 PM      Result Value Range Status   Specimen Description URINE, CATHETERIZED   Final   Special Requests NONE   Final   Culture  Setup Time     Final   Value: 04/11/2013 16:43     Performed at [facility] Count     Final   Value: NO  GROWTH     Performed at [facility]   Culture     Final   Value: NO GROWTH     Performed at [facility]   Report Status 04/12/2013 FINAL   Final    Medical History: Past Medical History  Diagnosis Date  . Hypertension   . Diabetes mellitus without complication   . Chronic kidney disease     bph  . Arthritis   . Coronary artery disease   . Myocardial infarction     Assessment: 16 YOM s/p recent back surgery on 7/30, presented with hypotension, lactic acidosis, code sepsis was initiated. Patient continues on zosyn and vancomycin. Scr increased significantly 0.94 > 1.71, est. crcl ~ 42 ml/min. Due to expected I&D today (exact planned time unknown), scheduling vanc level difficult and level would likely be unreliable. Patient received 1g vancomycin at 0500 this morning.   Blood cultures 8/25 >> NGTD Urine 8/25 >> no growth  Goal of Therapy:  Vancomycin trough level 15-20 mcg/ml  Plan:  - Hold PM dose of vancomycin today due to significant increase in Scr; check random level at 0500 tomorrow and re-dose as appropriate based on level and renal function. - Continue zosyn 3.375g IV Q 8 hrs - f/u renal fx, vancomycin level, and cultures   Sebastiano Luecke C. Yousuf Ager, PharmD Clinical Pharmacist-Resident Pager: [phone] Pharmacy: [phone] 04/13/2013 8:39 AM

## 2013-04-13 NOTE — Transfer of Care (Signed)
Immediate Anesthesia Transfer of Care Note  Patient: [name] [name]  Procedure(s) Performed: Procedure(s): IRRIGATION AND DEBRIDEMENT WOUND AND VAC DRESSING (N/A)  Patient Location: PACU  Anesthesia Type:General  Level of Consciousness: awake, alert , sedated, patient cooperative and responds to stimulation  Airway & Oxygen Therapy: Patient Spontanous Breathing and Patient connected to face mask oxygen  Post-op Assessment: Report given to PACU RN, Post -op Vital signs reviewed and stable and Patient moving all extremities  Post vital signs: Reviewed and stable  Complications: No apparent anesthesia complications

## 2013-04-13 NOTE — Anesthesia Postprocedure Evaluation (Signed)
Anesthesia Post Note  Patient: [name] [name]  Procedure(s) Performed: Procedure(s) (LRB): IRRIGATION AND DEBRIDEMENT WOUND AND VAC DRESSING (N/A)  Anesthesia type: General  Patient location: PACU  Post pain: Pain level controlled and Adequate analgesia  Post assessment: Post-op Vital signs reviewed, Patient's Cardiovascular Status Stable, Respiratory Function Stable, Patent Airway and Pain level controlled  Last Vitals:  Filed Vitals:   04/13/13 1722  BP: 108/49  Pulse: 75  Temp:   Resp: 22    Post vital signs: Reviewed and stable  Level of consciousness: awake, alert  and oriented  Complications: No apparent anesthesia complications

## 2013-04-13 NOTE — Progress Notes (Signed)
PULMONARY  / CRITICAL CARE MEDICINE  Name: [name] [name] MRN: [id number] DOB: 09/20/1933    ADMISSION DATE:  04/11/2013 CONSULTATION DATE:  04/11/2013  REFERRING MD :  EDP PRIMARY SERVICE: PCCM  CHIEF COMPLAINT:  AMS and hypotension.  BRIEF PATIENT DESCRIPTION:  79 yo male s/p T9 to L3 fusion on 7/30 complicated by wound drainage and hardware migration.  Had hardware removal and I&D, and transferred to SNF.  Developed recurrent fever and drainage with hypotension and sent to ER 8/25.  PCCM asked to admit to ICU with severe sepsis.  SIGNIFICANT EVENTS: 8/25 Admit to ICU with severe sepsis, ortho consulted  STUDIES:  8/25 Rt arm doppler >> Positive DVT brachial vein, superficial thrombosis basilic vein  LINES / TUBES: Rt arm PICC >> 8/25 LT [place] CVL 8/27 >>  CULTURES: Back wound 8/13 >> Serratia Blood 8/25>>> Urine 8/25>>>negative  ANTIBIOTICS: Vanc 8/25>>> Zosyn 8/25>>>  SUBJECTIVE:  More alert.  C/o soreness in back.  Denies leg pain.  VITAL SIGNS: Temp:  [97.3 F (36.3 C)-98 F (36.7 C)] 97.4 F (36.3 C) (08/27 0819) Pulse Rate:  [60-84] 68 (08/27 0800) Resp:  [7-24] 18 (08/27 0800) BP: (90-141)/(40-74) 131/73 mmHg (08/27 0800) SpO2:  [90 %-100 %] 99 % (08/27 0800) Weight:  [211 lb 6.7 oz (95.9 kg)] 211 lb 6.7 oz (95.9 kg) (08/27 0428) Room air  INTAKE / OUTPUT: Intake/Output     08/26 0701 - 08/27 0700 08/27 0701 - 08/28 0700   I.V. (mL/kg) 1950 (20.3) 75 (0.8)   IV Piggyback 525 100   Total Intake(mL/kg) 2475 (25.8) 175 (1.8)   Urine (mL/kg/hr) 935 (0.4)    Total Output 935     Net +1540 +175          PHYSICAL EXAMINATION: General: No distress Neuro: alert, follows commands, moves lower extremities, and normal sensation lower extremities HEENT: Pale sclera Cardiovascular: regular, no murmur Lungs: decreased breath sounds, no wheeze Abdomen: soft, non tender, decreased bowel sounds Musculoskeletal:  1+ Rt arm edema, 1+ b/l lower extremity  edema Skin: no rashes  LABS:  CBC Recent Labs     04/11/13  2022  04/12/13  0410  04/13/13  0400  WBC  7.0  5.7  4.5  HGB  7.3*  7.5*  9.0*  HCT  21.9*  23.3*  27.6*  PLT  84*  90*  106*   Coag's Recent Labs     04/11/13  1512  04/11/13  2022  04/13/13  0400  APTT   --   33  34  INR  1.87*  1.90*  1.23   BMET Recent Labs     04/11/13  2022  04/12/13  0410  04/13/13  0400  NA  143  142  143  K  3.3*  4.0  3.3*  CL  112  112  107  CO2  22  24  24   BUN  38*  36*  39*  CREATININE  0.98  0.94  1.71*  GLUCOSE  87  102*  171*   Electrolytes Recent Labs     04/11/13  2022  04/12/13  0410  04/13/13  0400  CALCIUM  8.7  8.7  8.2*  MG   --   1.6   --   PHOS   --   3.2   --    Sepsis Markers Recent Labs     04/12/13  0931  04/13/13  0400  PROCALCITON  1.60  <0.10    ABG  Recent Labs     04/12/13  1000  PHART  7.460*  PCO2ART  31.0*  PO2ART  97.2    Liver Enzymes Recent Labs     04/11/13  1512  04/11/13  2022  AST  46*  37  ALT  25  23  ALKPHOS  83  72  BILITOT  2.9*  2.4*  ALBUMIN  1.3*  1.2*   Cardiac Enzymes Recent Labs     04/11/13  2022  TROPONINI  <0.30   Glucose Recent Labs     04/12/13  1143  04/12/13  1611  04/12/13  2056  04/12/13  2336  04/13/13  0351  04/13/13  0758  GLUCAP  122*  122*  137*  128*  111*  159*    Imaging Dg Chest Port 1 View  04/13/2013   *RADIOLOGY REPORT*  Clinical Data: Post central line placement.  PORTABLE CHEST - 1 VIEW  Comparison: 04/13/2013 at 0426 hours and CT chest 03/31/2013.  Findings: Trachea is midline.  Heart size stable.  Left subclavian central line tip projects over the SVC.  No definite pneumothorax. Heart size stable.  Lungs are low in volume with slight interval improvement in basilar dependent interstitial prominence and indistinctness. Left lower lobe air space disease persists.  Small left pleural effusion.  IMPRESSION:  1.  Left subclavian central line placement without definite  pneumothorax. 2.  Slight improvement in bibasilar dependent edema. 3.  Persistent left lower lobe air space disease. 4.  Small left pleural effusion.   Original Report Authenticated By: Leanna Battles, M.D.   Dg Chest Port 1 View  04/13/2013   *RADIOLOGY REPORT*  Clinical Data: Follow up pulmonary infiltrates  PORTABLE CHEST - 1 VIEW  Comparison: Prior chest x-ray 08/26 1014  Findings: Slightly improved inspiratory volumes and decreasing pulmonary edema.  Persistent layering pleural effusions and associated bibasilar atelectasis versus infiltrate. Prior median sternotomy.  Stable aortic atherosclerosis and cardiomegaly.  No pneumothorax.  IMPRESSION:  1.  Slightly improved inspiratory volumes and decreased pulmonary edema. 2.  Persistent small bilateral layering pleural effusions and associated bibasilar atelectasis versus infiltrate.   Original Report Authenticated By: Malachy Moan, M.D.   Dg Chest Port 1 View  04/12/2013   *RADIOLOGY REPORT*  Clinical Data: Respiratory failure  PORTABLE CHEST - 1 VIEW  Comparison: Prior chest x-ray 04/11/2013  Findings: Persistent very low lung volumes with slightly increased pulmonary vascular congestion and pulmonary edema.  Probable small left greater than right layering pleural effusions.  Cardiomegaly is similar compared to prior.  Atherosclerotic calcifications noted in the transverse and descending thoracic aorta.  The patient is status post median sternotomy.  Linear calcification in the left upper quadrant is likely peri splenic after correlation with prior CT imaging.  No pneumothorax or acute osseous abnormality.  IMPRESSION:  1.  Worsening pulmonary edema. 2.  Small bilateral layering pleural effusions with associated atelectasis versus infiltrate. 3.  Inspiratory volumes remain very low.   Original Report Authenticated By: Malachy Moan, M.D.   Dg Chest Portable 1 View  04/11/2013   *RADIOLOGY REPORT*  Clinical Data: Sepsis  PORTABLE CHEST - 1 VIEW   Comparison: 04/11/2013; 04/04/2013  Findings: Grossly unchanged enlarged cardiac silhouette and mediastinal contours post median sternotomy given persistently reduced lung volumes.  Atherosclerotic calcifications within the thoracic aorta. Interval removal of right upper extremity approach PICC line.  The pulmonary vasculature is less distinct on the present examination.  Worsening bibasilar heterogeneous and consolidative opacities, left greater than right.  Trace bilateral effusions are not excluded.  No pneumothorax.  Unchanged bones.  IMPRESSION: 1.  Decreased lung volumes with findings suggestive of pulmonary edema and worsening bibasilar opacities, left greater than right, atelectasis versus infiltrate. 2.  Interval removal of PICC line.   Original Report Authenticated By: Tacey Ruiz, MD   Dg Chest Portable 1 View  04/11/2013   *RADIOLOGY REPORT*  Clinical Data: Short of breath, lung infection  PORTABLE CHEST - 1 VIEW  Comparison: Prior chest x-ray 04/04/2013  Findings: Right upper extremity PICC.  The catheter tip projects over the superior cavoatrial junction.  Stable cardiac and mediastinal contours.  Continued left retrocardiac opacity and probable small left pleural effusion.  Stable pulmonary vascular congestion without overt edema and linear right basilar atelectasis.  IMPRESSION: No significant interval change in the appearance of the chest compared to 04/04/2013.  Persistent left greater than right basilar atelectasis versus consolidation.  The tip of the right upper extremity PICC projects over the superior cavoatrial junction.   Original Report Authenticated By: Malachy Moan, M.D.    ASSESSMENT / PLAN:  PULMONARY A: Acute hypoxic respiratory failure 2nd to pulmonary edema versus ALI and atelectasis >> improved 8/27. P:   -oxygen to keep SpO2 > 92%   -f/u CXR 8/28  CARDIOVASCULAR A: Severe sepsis >> hemodynamics improved 8/27. Hx of HTN, CAD, hyperlipidemia. Rt arm DVT 2nd to  PICC line. P:  -continue IV fluids at 75 ml/hr -hold outpt cozaar, zocor -defer anti-coagulation for now with plan for surgery  RENAL A: Hypokalemia. Hx of BPH. P:   -monitor renal fx, urine outpt, electrolytes -hold outpt flomax -keep foley in for now  GASTROINTESTINAL A:  Nutrition. P:   -NPO for now >> re-assess swallowing after surgery  HEMATOLOGIC A: Anemia of critical illness and chronic disease. P:  -f/u CBC -transfuse for Hb < 7 -SQ heparin for now >> consider transition to full dose anti-coagulation after surgery for RUE DVT  INFECTIOUS A: Thoracic/lumbar spine wound infection.  Cx from 8/13 with Serratia, and was on rocephin as outpt. P:   -D2/x vancomycin, zosyn -ortho planning for repeat I&D in OR 8/27 -f/u Cx results  ENDOCRINE A:  Hx of DM type II.   Relative adrenal insufficiency >> 8/25 cortisol >> 16.1 P:   -start to wean off solu cortef 8/27 -SSI -hold outpt glucotrol, glucophage  NEUROLOGIC A: Acute encephalopathy 2nd to sepsis >> improved 8/27. P:   -minimize sedatives, narcotics -monitor mental status  Summary: Medical status optimized for repeat I&D.  Clearly he needs to have procedure done to improve recovery process from infection.  CC time 35 minutes  Coralyn Helling, MD [facility] Pulmonary/Critical Care 04/13/2013, 9:54 AM Pager:  [phone] After 3pm call: [phone]

## 2013-04-13 NOTE — Progress Notes (Addendum)
Subjective: Patient doing well this morning.  Resting comfortably.     Objective: Vital signs in last 24 hours: Temp:  [97.3 F (36.3 C)-98 F (36.7 C)] 97.3 F (36.3 C) (08/27 0400) Pulse Rate:  [60-84] 78 (08/27 0700) Resp:  [7-24] 21 (08/27 0700) BP: (90-143)/(40-74) 117/57 mmHg (08/27 0700) SpO2:  [90 %-100 %] 97 % (08/27 0700) Weight:  [95.9 kg (211 lb 6.7 oz)] 95.9 kg (211 lb 6.7 oz) (08/27 0428)  Intake/Output from previous day: 08/26 0701 - 08/27 0700 In: 2275 [I.V.:1950; IV Piggyback:325] Out: 935 [Urine:935] Intake/Output this shift:     Recent Labs  04/11/13 1512 04/11/13 1545 04/11/13 2022 04/12/13 0410 04/13/13 0400  HGB 8.0* 7.8* 7.3* 7.5* 9.0*    Recent Labs  04/12/13 0410 04/13/13 0400  WBC 5.7 4.5  RBC 2.49* 2.91*  HCT 23.3* 27.6*  PLT 90* 106*    Recent Labs  04/12/13 0410 04/13/13 0400  NA 142 143  K 4.0 3.3*  CL 112 107  CO2 24 24  BUN 36* 39*  CREATININE 0.94 1.71*  GLUCOSE 102* 171*  CALCIUM 8.7 8.2*    Recent Labs  04/11/13 2022 04/13/13 0400  INR 1.90* 1.23    Exam:  Neurologically intact bilat le.  Alert and oriented.    Assessment/Plan: OR this morning for wound I&D and VAC placement.  Per Dr Sherene Sires, he has been cleared for surgery.     OWENS,JAMES M 04/13/2013, 7:44 AM     Agree with above Will I+D the wound.  Will leave the caudal portion open and packed with VAC Continue Abx regimen

## 2013-04-13 NOTE — Progress Notes (Deleted)
eLink Physician-Brief Progress Note Patient Name: [name] [name] DOB: [birth year] MRN: [id number]  Date of Service  04/13/2013   HPI/Events of Note  Hypokalemia   eICU Interventions  Potassium replaced   Intervention Category Minor Interventions: Electrolytes abnormality - evaluation and management  DETERDING,ELIZABETH 04/13/2013, 6:28 AM

## 2013-04-13 NOTE — Anesthesia Procedure Notes (Signed)
Procedure Name: Intubation Date/Time: 04/13/2013 2:56 PM Performed by: Arlice Colt B Pre-anesthesia Checklist: Patient identified, Emergency Drugs available, Suction available, Patient being monitored and Timeout performed Patient Re-evaluated:Patient Re-evaluated prior to inductionOxygen Delivery Method: Circle system utilized Preoxygenation: Pre-oxygenation with 100% oxygen Intubation Type: IV induction and Rapid sequence Laryngoscope Size: Mac and 3 Grade View: Grade I Tube type: Oral Tube size: 7.5 mm Number of attempts: 1 Airway Equipment and Method: Stylet Placement Confirmation: ETT inserted through vocal cords under direct vision,  positive ETCO2 and breath sounds checked- equal and bilateral Secured at: 22 cm Tube secured with: Tape Dental Injury: Teeth and Oropharynx as per pre-operative assessment

## 2013-04-13 NOTE — Procedures (Signed)
Central Venous Catheter Insertion Procedure Note Valeria Krisko [id number] [birth year]  Procedure: Insertion of Central Venous Catheter Indications: Assessment of intravascular volume and Drug and/or fluid administration  Procedure Details Consent: Risks of procedure as well as the alternatives and risks of each were explained to the (patient/caregiver).  Consent for procedure obtained. Time Out: Verified patient identification, verified procedure, site/side was marked, verified correct patient position, special equipment/implants available, medications/allergies/relevent history reviewed, required imaging and test results available.  Performed  Maximum sterile technique was used including antiseptics, cap, gloves, gown, hand hygiene, mask and sheet. Skin prep: Chlorhexidine; local anesthetic administered A antimicrobial bonded/coated triple lumen catheter was placed in the left subclavian vein using the Seldinger technique.  Evaluation Blood flow good Complications: No apparent complications Patient did tolerate procedure well. Chest X-ray ordered to verify placement.  CXR: pending.  BABCOCK,PETE 04/13/2013, 9:33 AM  I was present for procedure.  Coralyn Helling, MD [facility] Pulmonary/Critical Care 04/13/2013, 9:44 AM Pager:  [phone] After 3pm call: [phone]

## 2013-04-13 NOTE — Brief Op Note (Signed)
04/11/2013 - 04/13/2013  4:18 PM  PATIENT:  [name] [name]  79 y.o. male  PRE-OPERATIVE DIAGNOSIS:  post op wound infection lumbar spine  POST-OPERATIVE DIAGNOSIS:  post op wound infection lumbar spine  PROCEDURE:  Procedure(s): IRRIGATION AND DEBRIDEMENT WOUND AND VAC DRESSING (N/A)  SURGEON:  Surgeon(s) and Role:    * Venita Lick, MD - Primary  PHYSICIAN ASSISTANT:   ASSISTANTS: none   ANESTHESIA:   general  EBL:  Total I/O In: 975 [I.V.:375; IV Piggyback:600] Out: 180 [Urine:180]  BLOOD ADMINISTERED:none  DRAINS: none   LOCAL MEDICATIONS USED:  NONE  SPECIMEN:  No Specimen  DISPOSITION OF SPECIMEN:  N/A  COUNTS:  YES  TOURNIQUET:  * No tourniquets in log *  DICTATION: .Other Dictation: Dictation Number [id number]  PLAN OF CARE: Admit to inpatient   PATIENT DISPOSITION:  ICU - extubated and stable.

## 2013-04-13 NOTE — Anesthesia Preprocedure Evaluation (Addendum)
Anesthesia Evaluation  Patient identified by MRN, date of birth, ID band Patient awake    Reviewed: Allergy & Precautions, H&P , NPO status , Patient's Chart, lab work & pertinent test results  History of Anesthesia Complications Negative for: history of anesthetic complications  Airway Mallampati: II TM Distance: >3 FB Neck ROM: Full    Dental  (+) Edentulous Upper and Partial Lower   Pulmonary          Cardiovascular hypertension, Pt. on medications + CAD, + Past MI and + CABG Rhythm:Regular Rate:Normal     Neuro/Psych    GI/Hepatic   Endo/Other  diabetes, Type 2, Oral Hypoglycemic Agents  Renal/GU Renal InsufficiencyRenal disease     Musculoskeletal   Abdominal   Peds  Hematology  (+) Blood dyscrasia, anemia ,   Anesthesia Other Findings   Reproductive/Obstetrics                          Anesthesia Physical Anesthesia Plan  ASA: III  Anesthesia Plan: General   Post-op Pain Management:    Induction: Intravenous  Airway Management Planned: Oral ETT  Additional Equipment:   Intra-op Plan:   Post-operative Plan: Extubation in OR and Possible Post-op intubation/ventilation  Informed Consent: I have reviewed the patients History and Physical, chart, labs and discussed the procedure including the risks, benefits and alternatives for the proposed anesthesia with the patient or authorized representative who has indicated his/her understanding and acceptance.   Dental advisory given  Plan Discussed with: Anesthesiologist and Surgeon  Anesthesia Plan Comments:         Anesthesia Quick Evaluation

## 2013-04-14 ENCOUNTER — Encounter (HOSPITAL_COMMUNITY)
Admission: EM | Disposition: A | Discharge status: Other Acute Inpatient Hospital | Payer: Self-pay | Source: Home / Self Care | Attending: Pulmonary Disease

## 2013-04-14 DIAGNOSIS — Z48812 Encounter for surgical aftercare following surgery on the circulatory system: Secondary | ICD-10-CM

## 2013-04-14 DIAGNOSIS — I80299 Phlebitis and thrombophlebitis of other deep vessels of unspecified lower extremity: Secondary | ICD-10-CM

## 2013-04-14 DIAGNOSIS — M7989 Other specified soft tissue disorders: Secondary | ICD-10-CM

## 2013-04-14 HISTORY — PX: INSERTION OF VENA CAVA FILTER: SHX5513

## 2013-04-14 LAB — BASIC METABOLIC PANEL
BUN: 39 mg/dL — ABNORMAL HIGH (ref 6–23)
Creatinine, Ser: 0.97 mg/dL (ref 0.50–1.35)
GFR calc Af Amer: >90 mL/min (ref >90)
GFR calc non Af Amer: 78 mL/min — ABNORMAL LOW (ref >90)

## 2013-04-14 LAB — CBC
HCT: 20.8 % — ABNORMAL LOW (ref 39.0–52.0)
MCH: 29.8 pg (ref 26.0–34.0)
MCHC: 32.2 g/dL (ref 30.0–36.0)
Platelets: 120 10*3/uL — ABNORMAL LOW (ref 150–400)
Platelets: 75 10*3/uL — ABNORMAL LOW (ref 150–400)
RBC: 1.91 MIL/uL — ABNORMAL LOW (ref 4.22–5.81)
RDW: 19.5 % — ABNORMAL HIGH (ref 11.5–15.5)
WBC: 5 10*3/uL (ref 4.0–10.5)

## 2013-04-14 LAB — PREPARE RBC (CROSSMATCH)

## 2013-04-14 LAB — PROTIME-INR
INR: 1.76 — ABNORMAL HIGH (ref 0.00–1.49)
Prothrombin Time: 23.3 seconds — ABNORMAL HIGH (ref 11.6–15.2)

## 2013-04-14 LAB — GLUCOSE, CAPILLARY
Glucose-Capillary: 145 mg/dL — ABNORMAL HIGH (ref 70–99)
Glucose-Capillary: 95 mg/dL (ref 70–99)

## 2013-04-14 SURGERY — INSERTION OF VENA CAVA FILTER
Anesthesia: LOCAL

## 2013-04-14 MED ORDER — POTASSIUM CHLORIDE CRYS ER 20 MEQ PO TBCR
40.0000 meq | EXTENDED_RELEASE_TABLET | Freq: Once | ORAL | Status: AC
  Administered 2013-04-14: 40 meq via ORAL
  Filled 2013-04-14: qty 2

## 2013-04-14 MED ORDER — VANCOMYCIN HCL 10 G IV SOLR
1250.0000 mg | Freq: Two times a day (BID) | INTRAVENOUS | Status: DC
  Administered 2013-04-14: 1250 mg via INTRAVENOUS
  Filled 2013-04-14 (×2): qty 1250

## 2013-04-14 MED ORDER — SIMVASTATIN 10 MG PO TABS
10.0000 mg | ORAL_TABLET | Freq: Every evening | ORAL | Status: DC
  Administered 2013-04-14: 10 mg via ORAL
  Filled 2013-04-14 (×2): qty 1

## 2013-04-14 NOTE — Progress Notes (Signed)
ANTIBIOTIC CONSULT NOTE - Follow-Up  Pharmacy Consult for Vancomycin/zosyn Indication: sepsis/cellulitis  No Known Allergies  Patient Measurements: Height: 5\' 10"  (177.8 cm) Weight: 211 lb 6.7 oz (95.9 kg) IBW/kg (Calculated) : 73  Vital Signs: Temp: 97.2 F (36.2 C) (08/28 0637) Temp src: Oral (08/28 0637) BP: 117/47 mmHg (08/28 0645) Pulse Rate: 71 (08/28 0645) Intake/Output from previous day: 08/27 0701 - 08/28 0700 In: 3082.2 [P.O.:300; I.V.:2082.2; IV Piggyback:700] Out: 1212 [Urine:762; Drains:250; Blood:200] Intake/Output from this shift:    Labs:  Recent Labs  04/13/13 0400 04/13/13 0930 04/13/13 1816 04/14/13 0455  WBC 4.5 10.5 8.7 6.8  HGB 9.0* 8.0* 7.0* 5.7*  PLT 106* 140* 122* 120*  CREATININE 1.71* 0.83  --  0.97   Estimated Creatinine Clearance: 75.3 ml/min (by C-G formula based on Cr of 0.97). No results found for this basename: VANCOTROUGH, VANCOPEAK, VANCORANDOM, GENTTROUGH, GENTPEAK, GENTRANDOM, TOBRATROUGH, TOBRAPEAK, TOBRARND, AMIKACINPEAK, AMIKACINTROU, AMIKACIN,  in the last 72 hours   Microbiology: Recent Results (from the past 720 hour(s))  ANAEROBIC CULTURE     Status: None   Collection Time    03/30/13  3:50 PM      Result Value Range Status   Specimen Description WOUND BACK   Final   Special Requests NONE   Final   Gram Stain     Final   Value: MODERATE WBC PRESENT,BOTH PMN AND MONONUCLEAR     NO SQUAMOUS EPITHELIAL CELLS SEEN     NO ORGANISMS SEEN     Performed at [facility]     Performed at [facility]   Culture     Final   Value: NO ANAEROBES ISOLATED     Performed at [facility]   Report Status 04/04/2013 FINAL   Final  WOUND CULTURE     Status: None   Collection Time    03/30/13  3:50 PM      Result Value Range Status   Specimen Description WOUND BACK   Final   Special Requests NONE   Final   Gram Stain     Final   Value: MODERATE WBC PRESENT,BOTH PMN AND MONONUCLEAR     NO SQUAMOUS  EPITHELIAL CELLS SEEN     NO ORGANISMS SEEN     Performed at [facility]     Performed at [facility]   Culture     Final   Value: FEW SERRATIA MARCESCENS     Performed at [facility]   Report Status 04/02/2013 FINAL   Final   Organism ID, Bacteria SERRATIA MARCESCENS   Final  GRAM STAIN     Status: None   Collection Time    03/30/13  3:50 PM      Result Value Range Status   Specimen Description WOUND BACK   Final   Special Requests NONE   Final   Gram Stain     Final   Value: MODERATE WBC PRESENT,BOTH PMN AND MONONUCLEAR     NO ORGANISMS SEEN   Report Status 03/30/2013 FINAL   Final  MRSA PCR SCREENING     Status: None   Collection Time    03/30/13 10:34 PM      Result Value Range Status   MRSA by PCR NEGATIVE  NEGATIVE Final   Comment:            The GeneXpert MRSA Assay (FDA     approved for NASAL specimens     only), is one component of  a     comprehensive MRSA colonization     surveillance program. It is not     intended to diagnose MRSA     infection nor to guide or     monitor treatment for     MRSA infections.  URINE CULTURE     Status: None   Collection Time    03/31/13 11:17 PM      Result Value Range Status   Specimen Description URINE, CATHETERIZED   Final   Special Requests CX ADDED AT [id number]   Final   Culture  Setup Time     Final   Value: 04/01/2013 00:58     Performed at [facility]   Colony Count     Final   Value: NO GROWTH     Performed at [facility]   Culture     Final   Value: NO GROWTH     Performed at [facility]   Report Status 04/02/2013 FINAL   Final  CULTURE, BLOOD (ROUTINE X 2)     Status: None   Collection Time    04/04/13  9:20 AM      Result Value Range Status   Specimen Description BLOOD RIGHT ANTECUBITAL   Final   Special Requests BOTTLES DRAWN AEROBIC ONLY 10CC   Final   Culture  Setup Time     Final   Value: 04/04/2013 16:48     Performed at [facility]    Culture     Final   Value: NO GROWTH 5 DAYS     Performed at [facility]   Report Status 04/10/2013 FINAL   Final  CULTURE, BLOOD (ROUTINE X 2)     Status: None   Collection Time    04/04/13  9:50 AM      Result Value Range Status   Specimen Description BLOOD HAND RIGHT   Final   Special Requests BOTTLES DRAWN AEROBIC ONLY 10CC   Final   Culture  Setup Time     Final   Value: 04/04/2013 16:48     Performed at [facility]   Culture     Final   Value: NO GROWTH 5 DAYS     Performed at [facility]   Report Status 04/10/2013 FINAL   Final  CULTURE, BLOOD (ROUTINE X 2)     Status: None   Collection Time    04/11/13  3:18 PM      Result Value Range Status   Specimen Description BLOOD HAND LEFT   Final   Special Requests BOTTLES DRAWN AEROBIC ONLY 10CC   Final   Culture  Setup Time     Final   Value: 04/11/2013 23:11     Performed at [facility]   Culture     Final   Value:        BLOOD CULTURE RECEIVED NO GROWTH TO DATE CULTURE WILL BE HELD FOR 5 DAYS BEFORE ISSUING A FINAL NEGATIVE REPORT     Performed at [facility]   Report Status PENDING   Incomplete  CULTURE, BLOOD (ROUTINE X 2)     Status: None   Collection Time    04/11/13  3:40 PM      Result Value Range Status   Specimen Description BLOOD HAND LEFT   Final   Special Requests BOTTLES DRAWN AEROBIC AND ANAEROBIC 10CC   Final   Culture  Setup Time     Final  Value: 04/11/2013 23:12     Performed at [facility]   Culture     Final   Value:        BLOOD CULTURE RECEIVED NO GROWTH TO DATE CULTURE WILL BE HELD FOR 5 DAYS BEFORE ISSUING A FINAL NEGATIVE REPORT     Performed at [facility]   Report Status PENDING   Incomplete  URINE CULTURE     Status: None   Collection Time    04/11/13  3:51 PM      Result Value Range Status   Specimen Description URINE, CATHETERIZED   Final   Special Requests NONE   Final   Culture  Setup Time     Final   Value: 04/11/2013  16:43     Performed at [facility] Count     Final   Value: NO GROWTH     Performed at [facility]   Culture     Final   Value: NO GROWTH     Performed at [facility]   Report Status 04/12/2013 FINAL   Final    Medical History: Past Medical History  Diagnosis Date  . Hypertension   . Diabetes mellitus without complication   . Chronic kidney disease     bph  . Arthritis   . Coronary artery disease   . Myocardial infarction     Assessment: 5 YOM s/p recent back surgery on 7/30, presented with hypotension, lactic acidosis, code sepsis was initiated. Patient continues on zosyn and vancomycin. S/d I & D. Held vancomycin yesterday due to SCr of 1.71 and I&D scheduled; had planned to check vancomycin trough but SCr of 1.71 was later reported as incorrect and was corrected to 0.83. Trough canceled and vanc to be redosed.  SCr 0.97 CrCl ~75  UO 0.3 ml/kg/hr  Blood cultures 8/25 >> NGTD Urine 8/25 >> no growth  Goal of Therapy:  Vancomycin trough level 15-20 mcg/ml  Plan:  -Vancomycin 1250 mg IV q 12 hours (per nomogram based on weight and renal function and considering yesterday's PM dose yesterday held) - Consider vanc trough at steady state if therapy continues - Continue zosyn 3.375g IV Q 8 hrs - F/u renal fx, cultures, clinical progression  Toriana Sponsel C. Yardley Beltran, PharmD Clinical Pharmacist-Resident Pager: [phone] Pharmacy: [phone] 04/14/2013 7:18 AM

## 2013-04-14 NOTE — Progress Notes (Signed)
[facility]  Full consult to follow.  Pt has R arm SVT and B calf DVT.  I don't usually recommend anticoagulation for calf DVT, rather I recommend bilateral compression stockings and repeat venous duplex in one week.  However, this patient is likely to be immobile due to his back issues for an extended period, which would predispose him to extension of his calf DVT.  Due to the bleeding issues, I doubt this patient is likely to be an anticoagulation candidate for a time period, so I agree with retrievable IVC filter placement, with intent to remove the IVC filter in the future.  In regards to the R arm superficial venous thrombosis, I don't routinely anticoagulate for brachial and basilic vein thrombosis, but there are some patients with large brachial and basilic veins that can serve as a reservoir for thrombus and subsequently a clinically significant pulmonary embolism, thus the most conservative management for such is anticoagulation.  There are no indications for superior vena caval filters, as they are associated with 50% mortality.  Leonides Sake, MD [facility] [place] Office: [phone] Pager: [phone]  04/14/2013, 12:57 PM

## 2013-04-14 NOTE — Progress Notes (Signed)
[facility] Nursing ICU Electrolyte Replacement Protocol  Patient Name: [name] [name] DOB: [birth year] MRN: [id number]  Date of Service  04/14/2013   HPI/Events of Note    Recent Labs Lab 04/07/13 0625  04/11/13 2022 04/12/13 0410 04/13/13 0400 04/13/13 0930 04/14/13 0455  NA 144  < > 143 142 143 144 145  K 3.0*  < > 3.3* 4.0 3.3* 3.1* 3.0*  CL 110  < > 112 112 107 114* 115*  CO2 28  < > 22 24 24 20 20   GLUCOSE 141*  < > 87 102* 171* 149* 89  BUN 28*  < > 38* 36* 39* 37* 39*  CREATININE 0.73  < > 0.98 0.94 1.71* 0.83 0.97  CALCIUM 8.8  < > 8.7 8.7 8.2* 8.5 8.3*  MG 1.7  --   --  1.6  --   --   --   PHOS  --   --   --  3.2  --   --   --   < > = values in this interval not displayed.  Estimated Creatinine Clearance: 75.3 ml/min (by C-G formula based on Cr of 0.97).  Intake/Output     08/27 0701 - 08/28 0700   P.O. 200   I.V. (mL/kg) 1997.2 (20.8)   IV Piggyback 700   Total Intake(mL/kg) 2897.2 (30.2)   Urine (mL/kg/hr) 752 (0.3)   Drains 250 (0.1)   Blood 200 (0.1)   Total Output 1202   Net +1695.2        - I/O DETAILED x24h    Total I/O In: 1319.3 [P.O.:200; I.V.:1019.3; IV Piggyback:100] Out: 497 [Urine:247; Drains:250] - I/O THIS SHIFT    ASSESSMENT   eICURN Interventions  K+ 3.0 Electrolyte protocol criteria NOT met. Electrolytes replaced by MD.    ASSESSMENT: MAJOR ELECTROLYTE    Merita Norton 04/14/2013, 6:03 AM

## 2013-04-14 NOTE — Progress Notes (Signed)
PT Cancellation Note  Patient Details Name: [name] [name] MRN: [id number] DOB: 12/28/1933   Cancelled Treatment:    Reason Eval/Treat Not Completed: Medical issues which prohibited therapy (HgB 5.7)   Fabio Asa 04/14/2013, 8:02 AM

## 2013-04-14 NOTE — Progress Notes (Signed)
*  PRELIMINARY RESULTS* Vascular Ultrasound Lower extremity venous duplex has been completed.  Preliminary findings: Evidence of DVT involving bilateral mid calf gastroc veins.     Farrel Demark, RDMS, RVT  04/14/2013, 10:01 AM

## 2013-04-14 NOTE — Progress Notes (Signed)
CRITICAL VALUE ALERT  Critical value received:  Hgb 5.7  Date of notification:04-14-13  Time of notification:  32  Critical value read back:yes  Nurse who received alert:  Jamesetta So RN  MD notified (1st page):  Dr Seth Bake. Sood  Time of first page:  [phone]  Responding MD: Dr Seth Bake. Sood  Time MD responded:  [phone]

## 2013-04-14 NOTE — Progress Notes (Signed)
PULMONARY  / CRITICAL CARE MEDICINE  Name: [name] [name] MRN: [id number] DOB: 11/04/1933    ADMISSION DATE:  04/11/2013 CONSULTATION DATE:  04/11/2013  REFERRING MD :  EDP PRIMARY SERVICE: PCCM  CHIEF COMPLAINT:  Acute encephalopathy  BRIEF PATIENT DESCRIPTION: 79 yo s/p T9 to L3 fusion on 7/30 complicated by wound drainage and hardware migration requiring hardware removal.  Developed recurrent fever and drainage with hypotension requiring ICU admission.  SIGNIFICANT EVENTS / STUDIES: 8/25 R UE Doppler >>> DVT brachial vein, superficial thrombosis basilic vein 8/27 OR >>> I&D and VAC placement back wound 8/28 BL LE Doppler >>> Preliminary bilateral mid calf gastrocnemius vein DVT >>>  LINES / TUBES: R UA PICC chronic >>> 8/25 L [place] CVL 8/27 >>> Foley 8/27 >>>  CULTURES: 8/13 Back wound >>> Serratia 8/25 Blood 8/25 >>> 8/25 Urine 8/25 >>> neg  ANTIBIOTICS: Vanc 8/25 >>> Zosyn 8/25 >>> 8/28  SUBJECTIVE: Complains of back pain.  Per RN - significant amount of blood drainage from back wound, coagulopathic, anemic, ordered PRBCs.  VITAL SIGNS: Temp:  [96.2 F (35.7 C)-98 F (36.7 C)] 97.1 F (36.2 C) (08/28 1000) Pulse Rate:  [66-85] 79 (08/28 1045) Resp:  [13-38] 23 (08/28 1045) BP: (85-139)/(30-88) 106/57 mmHg (08/28 1045) SpO2:  [90 %-100 %] 100 % (08/28 1000)  INTAKE / OUTPUT: Intake/Output     08/27 0701 - 08/28 0700 08/28 0701 - 08/29 0700   P.O. 300 480   I.V. (mL/kg) 2082.2 (21.7) 170 (1.8)   Blood  150   IV Piggyback 700 250   Total Intake(mL/kg) 3082.2 (32.1) 1050 (10.9)   Urine (mL/kg/hr) 762 (0.3) 125 (0.3)   Drains 400 (0.2) 50 (0.1)   Blood 200 (0.1)    Total Output 1362 175   Net +1720.2 +875          PHYSICAL EXAMINATION: General: Comfortable, no distress Neuro: Awake, alert, follows commands HEENT: Pale sclera Cardiovascular: rrr, no m/r/g Lungs: Diminished bilateral air entry, no added sounds Abdomen: Soft, nontender, diminished bowel  sounds Musculoskeletal:  Mild anasarca Skin: Pale, no rash  LABS:  Recent Labs Lab 04/11/13 1512 04/11/13 1544 04/11/13 1545 04/11/13 1820 04/11/13 2022 04/11/13 2023 04/12/13 0410 04/12/13 1000 04/13/13 0400 04/13/13 0930 04/13/13 1816 04/14/13 0455  HGB 8.0*  --  7.8*  --  7.3*  --  7.5*  --  9.0* 8.0* 7.0* 5.7*  WBC 7.9  --   --   --  7.0  --  5.7  --  4.5 10.5 8.7 6.8  PLT 91*  --   --   --  84*  --  90*  --  106* 140* 122* 120*  NA 141  --  143  --  143  --  142  --  143 144  --  145  K 3.6  --  3.5  --  3.3*  --  4.0  --  3.3* 3.1*  --  3.0*  CL 108  --  107  --  112  --  112  --  107 114*  --  115*  CO2 22  --   --   --  22  --  24  --  24 20  --  20  GLUCOSE 66*  --  66*  --  87  --  102*  --  171* 149*  --  89  BUN 43*  --  37*  --  38*  --  36*  --  39* 37*  --  39*  CREATININE 1.04  --  1.20  --  0.98  --  0.94  --  1.71* 0.83  --  0.97  CALCIUM 9.5  --   --   --  8.7  --  8.7  --  8.2* 8.5  --  8.3*  MG  --   --   --   --   --   --  1.6  --   --   --   --   --   PHOS  --   --   --   --   --   --  3.2  --   --   --   --   --   AST 46*  --   --   --  37  --   --   --   --   --   --   --   ALT 25  --   --   --  23  --   --   --   --   --   --   --   ALKPHOS 83  --   --   --  72  --   --   --   --   --   --   --   BILITOT 2.9*  --   --   --  2.4*  --   --   --   --   --   --   --   PROT 4.7*  --   --   --  4.5*  --   --   --   --   --   --   --   ALBUMIN 1.3*  --   --   --  1.2*  --   --   --   --   --   --   --   INR 1.87*  --   --   --  1.90*  --   --   --  1.23 1.77*  --  2.15*  APTT  --   --   --   --  33  --   --   --  34 34  --   --   LATICACIDVEN  --  3.90*  --  3.22*  --  2.6*  --   --   --   --   --   --   TROPONINI  --   --   --   --  <0.30  --   --   --   --   --   --   --   PHART  --   --   --   --   --   --   --  7.460*  --   --   --   --   PCO2ART  --   --   --   --   --   --   --  31.0*  --   --   --   --   PO2ART  --   --   --   --   --   --   --   97.2  --   --   --   --   HCO3  --   --   --   --   --   --   --  21.7  --   --   --   --  O2SAT  --   --   --   --   --   --   --  100.0  --   --   --   --    ASSESSMENT / PLAN:  PULMONARY A: Acute hypoxic respiratory failure (pulmonary edema vs ALI) - resolving. P:   Goal SpO2>92 Supplemental oxygen PRN  CARDIOVASCULAR A: Severe sepsis (infected PICC vs back wounds infection). Baseline HTN, CAD, dyslipidemia. R UE DVT (likely PICC related).  Preliminary BL LE DVT. P:  Holding Cozaar as soft BP Restart Zocor No anticoagulation as active hemorrhage and acute anemia requiring transfusion Discussed with vascular - will evaluate for IVC filter placement  RENAL A: Hypokalemia. BPH. P:   Trend BMP Given K 40 x 1, will give additional 40 x 1 Flomax LR@85   GASTROINTESTINAL A:  Nutrition. P:   NPO for possible procedure Gi Px is not required  HEMATOLOGIC A: Acute (of blood loss) on chronic anemia.  Coagulopathy, etiology is not clear/ P:  D/c Heparin FFP x 2  INFECTIOUS A: Thoracic/lumbar spine wound infection s/p debridement. P:   Abx / cultures as above D/c Vancomycin  ENDOCRINE A:  DM2.  Adrenal insufficiency of critical illness. P:   Glycemic control protocol, Phase 1 Hold glucotrol, glucophage  NEUROLOGIC A: Septic encephalopathy - resolved.  Postop pain. P:   Morphine / Oxycodone PRN Robaxin  I have personally obtained a history, examined the patient, evaluated laboratory and imaging results, formulated the assessment and plan and placed orders.  Lonia Farber, MD Pulmonary and Critical Care Medicine [facility] Pager: [phone]  04/14/2013, 12:33 PM

## 2013-04-14 NOTE — Consult Note (Addendum)
[facility] NOTE   MRN : [id number]  Reason for Consult: multiple DVTs Referring Physician: Dr. Marin Shutter  History of Present Illness: 79 y.o,  S/p T9-L3 fusion 03/16/2013, presents cc: back drainage.  Pt represented to the hospital with apparent sepsis.  He had I&D and hardware removal 2 weeks after surgery.  He  returned to the hospital on 04/11/2013.  Dr. Shon Baton performed thoracolumbar I&D with wound vac placement 04/13/2013.  We have been asked to place an IVC filter secondary to multiple DVTs.  The patient denies any history prior blood clots or familial thrombophilia.   Past Medical History  Diagnosis Date  . Hypertension   . Diabetes mellitus without complication   . Chronic kidney disease     bph  . Arthritis   . Coronary artery disease   . Myocardial infarction     Past Surgical History  Procedure Laterality Date  . Appendectomy    . Coronary artery bypass graft  11/09/2000    LIMA to LAD, SVG to RPDA, seq SVG to OM2 and D1 by [name] [facility]  . Lumbar fusion  03/15/2013    Dr Shon Baton  . Posterior lumbar fusion 4 level N/A 03/16/2013    Procedure: T9 - L3 POSTERIOR POSTERIOR SPINAL FUSION ;  Surgeon: Venita Lick, MD;  Location: [facility] OR;  Service: Orthopedics;  Laterality: N/A;  . Hardware removal N/A 03/30/2013    Procedure: HARDWARE REMOVAL/IRRIGATION & DEBRIDEMENT OF WOUND ON BACK;  Surgeon: Venita Lick, MD;  Location: [facility] OR;  Service: Orthopedics;  Laterality: N/A;    History   Social History  . Marital Status: Married    Spouse Name: N/A    Number of Children: 2  . Years of Education: N/A   Occupational History  . Not on file.   Social History Main Topics  . Smoking status: Former Games developer  . Smokeless tobacco: Never Used  . Alcohol Use: No     Comment: x 10 yrs  . Drug Use: No  . Sexual Activity: Not on file   Other Topics Concern  . Not on file   Social History Narrative  . No narrative on file     Family History  Problem Relation Age of Onset  . Diabetes Mother   . Stroke Brother     x2    No current facility-administered medications on file prior to encounter.   Current Outpatient Prescriptions on File Prior to Encounter  Medication Sig Dispense Refill  . brimonidine (ALPHAGAN) 0.2 % ophthalmic solution Place 1 drop into both eyes 2 (two) times daily.      . calcium-vitamin D (OSCAL WITH D) 250-125 MG-UNIT per tablet Take 2 tablets by mouth 2 (two) times daily.      . cholecalciferol (VITAMIN D) 1000 UNITS tablet Take 1,000 Units by mouth daily.      . Coenzyme Q10 (COQ-10) 100 MG CAPS Take 200 mg by mouth daily.      [name] dextrose 5 % SOLN 50 mL with cefTRIAXone 2 G SOLR 2 g Inject 2 g into the vein daily.  2 g  0  . docusate sodium (COLACE) 100 MG capsule Take 1 capsule (100 mg total) by mouth 3 (three) times daily as needed for constipation.  30 capsule  0  . glipiZIDE (GLUCOTROL) 5 MG tablet Take 2.5 mg by mouth daily.       [name] HYDROcodone-acetaminophen (NORCO/VICODIN) 5-325 MG per tablet Take 1 tablet by mouth every  6 (six) hours as needed for pain.  45 tablet  0  . losartan (COZAAR) 100 MG tablet Take 100 mg by mouth daily.      . metFORMIN (GLUCOPHAGE) 1000 MG tablet Take 1,000 mg by mouth daily.      . methocarbamol (ROBAXIN) 500 MG tablet Take 1 tablet (500 mg total) by mouth 3 (three) times daily as needed.  60 tablet  0  . Multiple Vitamins-Minerals (MULTIVITAMIN WITH MINERALS) tablet Take 1 tablet by mouth daily.      . ondansetron (ZOFRAN) 4 MG tablet Take 1 tablet (4 mg total) by mouth every 8 (eight) hours as needed for nausea.  20 tablet  0  . polyethylene glycol powder (GLYCOLAX) powder Take 17 g by mouth daily.  255 g  1  . Pyridoxine HCl (VITAMIN B-6) 500 MG tablet Take 500 mg by mouth daily.      . simvastatin (ZOCOR) 20 MG tablet Take 10 mg by mouth every evening.      . tamsulosin (FLOMAX) 0.4 MG CAPS Take 0.4 mg by mouth daily.        No Known  Allergies  REVIEW OF SYSTEMS  General: [ ]  Weight loss, [ ]  Fever, [ ]  chills Neurologic: [ ]  Dizziness, [ ]  Blackouts, [ ]  Seizure, [ ]  Stroke, [ ]  "Mini stroke", [ ]  Slurred speech, [ ]  Temporary blindness; [ ]  weakness in arms or legs, [ ]  Hoarseness [ ]  Dysphagia Cardiac: [ ]  Chest pain/pressure, [X]  Shortness of breath at rest [ ]  Shortness of breath with exertion, [ ]  Atrial fibrillation or irregular heartbeat  Vascular: [ ]  Pain in legs with walking, [ ]  Pain in legs at rest, [ ]  Pain in legs at night,  [ ]  Non-healing ulcer, [X]  Blood clot in vein/DVT,   Pulmonary: [ ]  Home oxygen, [ ]  Productive cough, [ ]  Coughing up blood, [ ]  Asthma, [ ]  Wheezing [ ]  COPD Musculoskeletal:  [ ]  Arthritis, [X]  Low back pain, [ ]  Joint pain Hematologic: [ ]  Easy Bruising, [ ]  Anemia; [ ]  Hepatitis Gastrointestinal: [ ]  Blood in stool, [ ]  Gastroesophageal Reflux/heartburn, Urinary: [ ]  chronic Kidney disease, [ ]  on HD - [ ]  MWF or [ ]  TTHS, [ ]  Burning with urination, [ ]  Difficulty urinating Skin: [ ]  Rashes, [ ]  Wounds Psychological: [ ]  Anxiety, [ ]  Depression   Physical Examination Filed Vitals:   04/14/13 1015 04/14/13 1045 04/14/13 1100 04/14/13 1200  BP: 94/54 106/57 107/52 105/41  Pulse: 80 79 85 73  Temp:   97.1 F (36.2 C)   TempSrc:   Oral   Resp: 22 23 23 21   Height:      Weight:      SpO2:   100% 100%   Body mass index is 30.34 kg/(m^2).  General:   NAD, tired, bed bound, obese HENT: WNL Eyes: Pupils equal Pulmonary: normal non-labored breathing , without Rales, rhonchi,  wheezing Cardiac: RRR, without  Murmurs, rubs or gallops; No carotid bruits Abdomen: soft, NT, no masses Skin: no rashes, ulcers noted;  no gangrene , no cellulitis; no open wounds;  Vascular Exam/Pulses: palpable PT pulses bil., radial pulses bil.  Edema in both LE right hand and arm > left Musculoskeletal: no muscle wasting or atrophy;  Neurologic: A&O X 3; Appropriate Affect ; SENSATION: normal;  MOTOR FUNCTION: 4/5 Symmetric; Speech is fluent/normal Psych: judgment intact, mood and affect appropriate Lymph: no palpable cervical or inguinal LAD  Significant Diagnostic  Studies: CBC Lab Results  Component Value Date   WBC 6.8 04/14/2013   HGB 5.7* 04/14/2013   HCT 17.7* 04/14/2013   MCV 92.7 04/14/2013   PLT 120* 04/14/2013    BMET    Component Value Date/Time   NA 145 04/14/2013 0455   K 3.0* 04/14/2013 0455   CL 115* 04/14/2013 0455   CO2 20 04/14/2013 0455   GLUCOSE 89 04/14/2013 0455   BUN 39* 04/14/2013 0455   CREATININE 0.97 04/14/2013 0455   CREATININE 0.82 03/14/2013 0812   CALCIUM 8.3* 04/14/2013 0455   GFRNONAA 78* 04/14/2013 0455   GFRAA >90 04/14/2013 0455   Estimated Creatinine Clearance: 75.3 ml/min (by C-G formula based on Cr of 0.97).  COAG Lab Results  Component Value Date   INR 2.15* 04/14/2013   INR 1.77* 04/13/2013   INR 1.23 04/13/2013     Non-Invasive Vascular Imaging:  Right upper ext venous duplex:  Deep vein thrombosis involving a single right brachial vein.  Findings consistent with superficial thrombosis involving thebasilic vein.  Vascular Ultrasound  Bilateral lower leg venous duplex:  Evidence of DVT involving bilateral mid calf gastroc veins.   Study read and finalized by Dr. Imogene Burn  ASSESSMENT/PLAN:  1. Bilateral calf DVT 2. R arm Superifical venous thrombosis and DVT 3. Acute bleeding due to debridement and coagulopathy likely related to sepsis 4. Sepsis: resolving 5. Thoracolumbar spinal infection   We will plan on IVC filter placement today  We also recommend TED hoses-I will order theses today  NPO until after procedure  The plan will be discussed with Dr. Tommi Emery, EMMA [facility] 04/14/2013 12:29 PM  Addendum  I have independently interviewed and examined the patient, and I agree with the physician assistant's findings.  In regards to the bilateral calf DVT, there is no level I evidence mandating  anticoagulation use, as opposed to proximal DVT (i.e. Popliteal vein and proximal).  I normally place TED hose, ambulate the patient and repeat the venous duplex in one week.  However, I doubt this patient will be ambulating soon and with chronic bed ridden state, he will be predisposed to proximal DVT extension from the calf veins, so in this case, I would consider strongly anticoagulation in this patient.  Due to the post-surgical bleeding and anticoagulation likely due to some degree of coagulopathy due to sepsis, this is not an option so I agree that temporary IVC filter placement is indicated, with planned retrieval in the future once patient is ambulating and calf vein DVT have resolved.  In regards to the R arm SVT (brachial) and DVT (basilic), I don't routinely anticoagulate for either.  That being said, some patient's brachial and basilic veins are big enough to store thrombii large enough to become sx PE.  Subsequently, it is not unreasonable treat the R arm vein thrombosis with anticoagulation also.  There is no role for SVC filter placement.  Reported series of such demonstrated >50% mortality rates.  We will schedule the pt for IVC filter placement today and see the patient in follow up for possible retrieval in one month.  Leonides Sake, MD [facility] [place] Office: [phone] Pager: [phone]  04/14/2013, 2:36 PM

## 2013-04-14 NOTE — Significant Event (Signed)
Pt noted to have bloody outpt from wound vac.  AM labs show significant anemia.  CBC    Component Value Date/Time   WBC 6.8 04/14/2013 0455   RBC 1.91* 04/14/2013 0455   HGB 5.7* 04/14/2013 0455   HCT 17.7* 04/14/2013 0455   PLT 120* 04/14/2013 0455   MCV 92.7 04/14/2013 0455   MCH 29.8 04/14/2013 0455   MCHC 32.2 04/14/2013 0455   RDW 20.5* 04/14/2013 0455    Will transfuse 2 units PRBC, and f/u Hb.  Coralyn Helling, MD 04/14/2013, 5:45 AM

## 2013-04-14 NOTE — Op Note (Signed)
[name] [name], [name] [name]             ACCOUNT NO.:  [id number]  MEDICAL RECORD NO.:  [id number]  LOCATION:  2M08C                        FACILITY:  [facility]  PHYSICIAN:  Alvy Beal, MD    DATE OF BIRTH:  1934/01/10  DATE OF PROCEDURE:  04/13/2013 DATE OF DISCHARGE:                              OPERATIVE REPORT   PREOPERATIVE DIAGNOSIS:  Post-Op wound infection.  POSTOPERATIVE DIAGNOSIS:  Post-op wound infection.  PROCEDURE:  Repeat I and D with application of VAC dressing.  HISTORY:  This is a very pleasant gentleman s/p T9-L3 fusion who developed a wound infection and hardware failure.  He was taken back to the operating room 2 weeks post-op for hardware removal and I and D.  The patient subsequently discharged about a week later after recovering and stabilizing.  The patient was discharged to a nursing home on Thursday and then returned on Monday because of failure to thrive and wound drainage.  The patient was diagnosed withsepsis and was admitted.  The wound had significant drainage.  There were also necrotic wound edges.  The patient was admitted to the Critical Care Service.  All appropriate risks, benefits, and alternatives were discussed and consent was obtained.  OPERATIVE NOTE:  The patient was brought to the operating room, placed supine on the operating table.  After successful induction of general anesthesia and endotracheal intubation, TEDs, SCDs were applied.  He was turned prone on to the [place] frame.  His back was then prepped and draped in a standard fashion.  Time-out was taken to confirm the patient, procedure, and all other pertinent important data.  At this point, I then removed all the sutures from the previous surgical debridement.  There was significant serosanguineous drainage but did not appear as purulent as it did at the time of the initial I and D a week and half earlier. The wound wasirrigated copiously with pulse lavage with a total of 9 L. Any  remaining bone graft was removed.  I then debridedthe wound edges removing all the necrotic tissue until I had bleeding, healthy tissue.  Once the debridement and irrigation was done, the cranial portion of the wound was reapproximated with #1 Prolene.  I used interrupted vertical mattress suture on the skin to reapproximate.  The inferior portion of the wound was left open as this was where the bulk of the necrotic tissue was removed . I loosely reapproximated the deep fascia with 3 Prolene sutures and then placed a VAC dressing. The wound edges were brought closer together with vessel loop.  I then placed a wound sponge over the portion that was closed. This was all then secured and connected to the wound VAC which was functioning fine.  The patient was then extubated and transferred to the PACU without incident.  At the end of the case, all needle and sponge counts were correct.     Alvy Beal, MD     DDB/MEDQ  D:  04/13/2013  T:  04/14/2013  Job:  [id number]

## 2013-04-14 NOTE — Progress Notes (Signed)
OT Cancellation Note  Patient Details Name: [name] [name] MRN: [id number] DOB: 1934-05-01   Cancelled Treatment:    Reason Eval/Treat Not Completed: Medical issues which prohibited therapy (HGB 5.7)  Evern Bio 04/14/2013, 8:03 AM

## 2013-04-14 NOTE — Op Note (Signed)
NAME: [name] [name]   MRN: [id number] DOB: April 19, 1934    DATE OF OPERATION: 04/14/2013  PREOP DIAGNOSIS: DVT with contraindication to aggressive anticoagulation  POSTOP DIAGNOSIS: same  PROCEDURE:  1. IVC cavogram 2. Placement of Celect IVC filter  SURGEON: Di Kindle. Edilia Bo, MD, FACS  ANESTHESIA: local   EBL: min  FINDINGS: the inferior vena cava is patent and appeared to be less than 30 mm.  TECHNIQUE: The patient was brought to the peripheral vascular lab. Both groins were prepped and draped in the usual sterile fashion. After the skin was anesthetized, and under ultrasound guidance, the right common femoral vein was cannulated and a guidewire introduced into the inferior vena cava under fluoroscopic control. The dilator was then advanced over the wire and then the dilator removed and pressure held. The coaxial introducer sheath system had been assembled and had  been flushed with heparinized saline. This was advanced over the wire and positioned in the inferior vena cava below the renal veins. The wire was then removed. An inferior venacavogram was obtained by injecting half-strength contrast through the dilator. Position of the renal veins was identified. Once I had chosen the correct position for placement of the filter, the wire was reintroduced and then the coaxial introducer sheath advanced with a dilator until the top of the sheath was positioned just below the renal veins. The wire was then removed and the dilator removed. The filter was then advanced through the sheath until it was level with the tactile bump on the filter introducer. Incision was again confirmed and then the filter introducer was then pinned and the sheath retracted in order to unsheath the filter. The red safety button was then released and then the release button was pushed to fully release the filter. The sheath was then retracted and pressure held for hemostasis. No immediate complications were  noted.  [name], MD, FACS Beeper [phone] 04/14/2013

## 2013-04-14 NOTE — Progress Notes (Addendum)
Subjective: Patient comfortable this morning.  Bilat venous dopplers were positive for bilat DVT.  Also has Right UE DVT.  Getting transfusion of PRBC's.     Objective: Vital signs in last 24 hours: Temp:  [96.2 F (35.7 C)-98 F (36.7 C)] 97.1 F (36.2 C) (08/28 0858) Pulse Rate:  [62-85] 80 (08/28 0915) Resp:  [17-38] 25 (08/28 0915) BP: (85-139)/(30-88) 110/88 mmHg (08/28 0915) SpO2:  [90 %-100 %] 100 % (08/28 0900)  Intake/Output from previous day: 08/27 0701 - 08/28 0700 In: 3082.2 [P.O.:300; I.V.:2082.2; IV Piggyback:700] Out: 1362 [Urine:762; Drains:400; Blood:200] Intake/Output this shift: Total I/O In: 710 [P.O.:360; I.V.:85; Blood:15; IV Piggyback:250] Out: 110 [Urine:60; Drains:50]   Recent Labs  04/12/13 0410 04/13/13 0400 04/13/13 0930 04/13/13 1816 04/14/13 0455  HGB 7.5* 9.0* 8.0* 7.0* 5.7*    Recent Labs  04/13/13 1816 04/14/13 0455  WBC 8.7 6.8  RBC 2.31* 1.91*  HCT 21.1* 17.7*  PLT 122* 120*    Recent Labs  04/13/13 0930 04/14/13 0455  NA 144 145  K 3.1* 3.0*  CL 114* 115*  CO2 20 20  BUN 37* 39*  CREATININE 0.83 0.97  GLUCOSE 149* 89  CALCIUM 8.5 8.3*    Recent Labs  04/13/13 0930 04/14/13 0455  INR 1.77* 2.15*    Exam:  Right UE and Bilat LE swollen.  Neurologically intact.  Wound Vac with bloody drainage.   Assessment/Plan: 1)  Wound I&D.  Continue wound vac. Will change dressing tomorrow.   2)  Right UE and new bilat LE DVT.  Spoke with CCM and advised from our standpoint it is ok to anticoagulate.  Also discussed possible filter placement.   3) anemia.  Transfusing 2 units PRBC's.     OWENS,JAMES M 04/14/2013, 10:29 AM     Events of day noted Agree with IVC filter placement Will plan on changing [facility] Friday afternoon at bedside [facility] to be OOB to chair  Ok to anticoagulate if needed - currently on lovenox Continue ICU monitoring

## 2013-04-14 NOTE — Progress Notes (Signed)
eLink Physician-Brief Progress Note Patient Name: [name] [name] DOB: 09/29/1933 MRN: [id number]  Date of Service  04/14/2013   HPI/Events of Note   Hb 7  eICU Interventions  1 u PRBC   Intervention Category Intermediate Interventions: Bleeding - evaluation and treatment with blood products  ALVA,RAKESH V. 04/14/2013, 8:00 PM

## 2013-04-15 ENCOUNTER — Telehealth: Payer: Self-pay | Admitting: Vascular Surgery

## 2013-04-15 ENCOUNTER — Encounter (HOSPITAL_COMMUNITY): Payer: Self-pay | Admitting: Orthopedic Surgery

## 2013-04-15 ENCOUNTER — Inpatient Hospital Stay
Admission: AD | Admit: 2013-04-15 | Discharge: 2013-04-27 | Disposition: A | Discharge status: Nursing Home (Includes ICF) | Payer: Self-pay | Source: Intra-hospital | Attending: Internal Medicine | Admitting: Internal Medicine

## 2013-04-15 ENCOUNTER — Other Ambulatory Visit: Payer: Self-pay | Admitting: *Deleted

## 2013-04-15 DIAGNOSIS — M4620 Osteomyelitis of vertebra, site unspecified: Secondary | ICD-10-CM

## 2013-04-15 DIAGNOSIS — Z48812 Encounter for surgical aftercare following surgery on the circulatory system: Secondary | ICD-10-CM

## 2013-04-15 DIAGNOSIS — E43 Unspecified severe protein-calorie malnutrition: Secondary | ICD-10-CM

## 2013-04-15 DIAGNOSIS — I82403 Acute embolism and thrombosis of unspecified deep veins of lower extremity, bilateral: Secondary | ICD-10-CM

## 2013-04-15 LAB — PREPARE FRESH FROZEN PLASMA: Unit division: 0

## 2013-04-15 LAB — TYPE AND SCREEN
ABO/RH(D): A POS
ABO/RH(D): A POS
Antibody Screen: NEGATIVE
Unit division: 0
Unit division: 0

## 2013-04-15 LAB — BASIC METABOLIC PANEL
Calcium: 8.1 mg/dL — ABNORMAL LOW (ref 8.4–10.5)
GFR calc non Af Amer: 83 mL/min — ABNORMAL LOW (ref >90)
Sodium: 146 mEq/L — ABNORMAL HIGH (ref 135–145)

## 2013-04-15 LAB — CBC
MCH: 30.4 pg (ref 26.0–34.0)
MCHC: 34.5 g/dL (ref 30.0–36.0)
Platelets: 75 10*3/uL — ABNORMAL LOW (ref 150–400)
RBC: 2.63 MIL/uL — ABNORMAL LOW (ref 4.22–5.81)

## 2013-04-15 LAB — MAGNESIUM: Magnesium: 1.6 mg/dL (ref 1.5–2.5)

## 2013-04-15 LAB — PHOSPHORUS: Phosphorus: 2.3 mg/dL (ref 2.3–4.6)

## 2013-04-15 MED ORDER — MORPHINE SULFATE 2 MG/ML IJ SOLN: 1.0000 mg | INTRAMUSCULAR | Status: DC | PRN

## 2013-04-15 MED ORDER — BIOTENE DRY MOUTH MT LIQD: 15.0000 mL | Freq: Two times a day (BID) | OROMUCOSAL | Status: DC

## 2013-04-15 MED ORDER — MAGNESIUM SULFATE 40 MG/ML IJ SOLN
2.0000 g | Freq: Once | INTRAMUSCULAR | Status: AC
  Administered 2013-04-15: 2 g via INTRAVENOUS
  Filled 2013-04-15: qty 50

## 2013-04-15 MED ORDER — SODIUM CHLORIDE 0.9 % IV SOLN: 250.0000 mL | INTRAVENOUS | Status: DC

## 2013-04-15 MED ORDER — POTASSIUM CHLORIDE CRYS ER 20 MEQ PO TBCR
40.0000 meq | EXTENDED_RELEASE_TABLET | Freq: Once | ORAL | Status: AC
  Administered 2013-04-15: 40 meq via ORAL
  Filled 2013-04-15: qty 2

## 2013-04-15 MED ORDER — POLYETHYLENE GLYCOL 3350 17 G PO PACK: 17.0000 g | PACK | Freq: Every day | ORAL | Status: DC

## 2013-04-15 MED ORDER — PIPERACILLIN-TAZOBACTAM 3.375 G IVPB: 3.3750 g | Freq: Three times a day (TID) | INTRAVENOUS | Status: DC

## 2013-04-15 MED ORDER — INSULIN ASPART 100 UNIT/ML ~~LOC~~ SOLN: 2.0000 [IU] | SUBCUTANEOUS | Status: DC

## 2013-04-15 MED ORDER — OXYCODONE HCL 10 MG PO TABS: 10.0000 mg | ORAL_TABLET | ORAL | Status: DC | PRN

## 2013-04-15 MED ORDER — BIOTENE DRY MOUTH MT LIQD
15.0000 mL | Freq: Two times a day (BID) | OROMUCOSAL | Status: DC
  Administered 2013-04-15: 15 mL via OROMUCOSAL

## 2013-04-15 NOTE — Discharge Summary (Signed)
Physician Discharge Summary     Patient ID: [name] [name] MRN: [id number] DOB/AGE: 1933/11/14 79 y.o.  Admit date: 04/11/2013 Discharge date: 04/15/2013  Discharge Diagnoses:  Acute hypoxic respiratory failure (pulmonary edema vs ALI) - resolving.  Severe sepsis (infected PICC vs back wounds infection). Baseline HTN, CAD, dyslipidemia.  R UE DVT (likely PICC related). BL LE DVT.  S/p IVC filter on 8/28 Hypokalemia. BPH.  Acute (of blood loss) on chronic anemia. Coagulopathy, etiology is not clear/  Thoracic/lumbar spine wound infection s/p debridement.  DM2.  Adrenal insufficiency of critical illness.  Septic encephalopathy - resolved.  Postop pain.    Detailed Hospital Course:   79 year old male s/p back surgery with T9 to L3 fusion on 7/30 who was discharged to SNF to return on 8/25 with sepsis, hypotension and lactic acidosis. Patient was fluid resuscitated in the ED with marginal stabilization of his BP. PCCM was called to consult for cellulitis induced septic shock and code sepsis.  Admitted to the intensive care. Started on early goal directed therapy protocol, CVL placed, empiric antibiotics, and surgical consult called. He was stabilized hemo-dynamically and brought to the OR by Dr Shon Baton on 8/27 where he underwent irrigation and debridement of lumbar spine wound, with VAC dressing placed. He returned to the ICU post-op. During exam there was concern and confirmation of upper extremity and bilateral lower extremity DVTs. Upper extremity PICC was removed. Because of recent spinal surgery we consulted vascular surgery and he underwent IVC placement on 8/28. His shock physiology has since resolved. He is hemodynamically stable, he was cleared for activity from ortho w/ recommendations to have VAC dressing changes on M/W/F. He will be discharged to [facility] where he can receive IV antibiotics, PT/Ot services and Wound care. The rest of his care is outlined below:     Discharge Plan by  diagnoses   Acute hypoxic respiratory failure (pulmonary edema vs ALI) - resolving.  P:  Goal SpO2>92  Supplemental oxygen PRN  Severe sepsis (infected PICC vs back wounds infection, shock resolved) Baseline HTN, CAD, dyslipidemia.  R UE DVT (likely PICC related).  BL LE DVT s/p IVC on 8/28 P:  -Holding Cozaar as soft BP  -Restart Zocor  -No anticoagulation as active hemorrhage and acute anemia requiring transfusion  -Bilateral TED hose and repeat venous duplex in one week (from 8/29) to see if calf DVT have extended proximally. -Hopefully, both calf DVT and the R arm DVT and SVT resolve with time. Once this occurs, the IVC filter can be retrieved. Would consult vascular re: this.  -Vascular has recommended f/u w/ Edilia Bo in 4 weeks.  Acute (of blood loss) on chronic anemia. Coagulopathy, etiology is not clear/ but presumed sepsis S/p transfusion of FFP P: Trend CBC Has IVC filter  Hypokalemia. BPH.  P:  Trend BMP  Monitor and replace lytes as needed  Flomax   Thoracic/lumbar spine wound infection s/p debridement.  P:  abx as above Needs vac dressing change M/W/F Cleared for mobilization w/ brace  DM2.  Adrenal insufficiency of critical illness.  P:  ssi Steroid taper over 5d  Septic encephalopathy - resolved. Postop pain.  P:  Morphine / Oxycodone PRN  Robaxin  Significant Hospital tests/ studies/ interventions and procedures  8/25 R UE Doppler >>> DVT brachial vein, superficial thrombosis basilic vein  8/27 OR >>> I&D and VAC placement back wound  8/28 BL LE Doppler:  bilateral mid calf gastrocnemius vein DVT   LINES / TUBES:  R UA  PICC chronic >>> 8/25  L [place] CVL 8/27 >>>  Foley 8/27 >>>   CULTURES:  8/13 Back wound >>> Serratia  8/25 Blood 8/25 >>>  8/25 Urine 8/25 >>> neg   ANTIBIOTICS:  Vanc 8/25 >>> 8/28 Zosyn 8/25 >>>   Consults Ortho: [facility]  Discharge Exam: BP 127/48  Pulse 85  Temp(Src) 97.8 F (36.6 C) (Oral)  Resp 29   Ht 5\' 10"  (1.778 m)  Wt 95.9 kg (211 lb 6.7 oz)  BMI 30.34 kg/m2  SpO2 95%  General: Comfortable, no distress  Neuro: Awake, alert, follows commands  HEENT: Pale sclera  Cardiovascular: rrr, no m/r/g  Lungs: Diminished bilateral air entry, no added sounds  Abdomen: Soft, nontender, diminished bowel sounds  Musculoskeletal: Mild anasarca  Skin: Pale, no rash  Labs at discharge Lab Results  Component Value Date   CREATININE 0.84 04/15/2013   BUN 29* 04/15/2013   NA 146* 04/15/2013   K 3.6 04/15/2013   CL 117* 04/15/2013   CO2 22 04/15/2013   Lab Results  Component Value Date   WBC 4.8 04/15/2013   HGB 8.0* 04/15/2013   HCT 23.2* 04/15/2013   MCV 88.2 04/15/2013   PLT 75* 04/15/2013   Lab Results  Component Value Date   ALT 23 04/11/2013   AST 37 04/11/2013   ALKPHOS 72 04/11/2013   BILITOT 2.4* 04/11/2013   Lab Results  Component Value Date   INR 1.76* 04/14/2013   INR 2.15* 04/14/2013   INR 1.77* 04/13/2013    Current radiology studies No results found.  Disposition:  03-Skilled Nursing Facility      Discharge Orders   Future Appointments Provider Department Dept Phone   05/10/2013 2:00 PM Randall Hiss, MD [facility] for Infectious Disease [phone]   05/18/2013 1:00 PM [facility] [phone]   05/18/2013 2:00 PM Chuck Hint, MD [facility] [phone]   Future Orders Complete By Expires   Diet - low sodium heart healthy  As directed    Discharge instructions  As directed    Comments:     Vac change M/W/F   Increase activity slowly  As directed        Medication List    STOP taking these medications for now, need re-eval prior to d/c from [facility]        dextrose 5 % SOLN 50 mL with cefTRIAXone 2 G SOLR 2 g     glipiZIDE 5 MG tablet  Commonly known as:  GLUCOTROL     losartan 100 MG tablet  Commonly known as:  COZAAR     metFORMIN 1000 MG tablet    Commonly known as:  GLUCOPHAGE     polyethylene glycol powder powder  Commonly known as:  GLYCOLAX  Replaced by:  polyethylene glycol packet     vitamin B-6 500 MG tablet      TAKE these medications       acetaminophen 325 MG tablet  Commonly known as:  TYLENOL  Take 650 mg by mouth every 6 (six) hours as needed for pain.     antiseptic oral rinse Liqd  15 mLs by Mouth Rinse route 2 (two) times daily.     brimonidine 0.2 % ophthalmic solution  Commonly known as:  ALPHAGAN  Place 1 drop into both eyes 2 (two) times daily.     calcium-vitamin D 250-125 MG-UNIT per tablet  Commonly known as:  OSCAL WITH D  Take 2 tablets by mouth 2 (two) times daily.     cholecalciferol 1000 UNITS tablet  Commonly known as:  VITAMIN D  Take 1,000 Units by mouth daily.     CoQ-10 100 MG Caps  Take 200 mg by mouth daily.     docusate sodium 100 MG capsule  Commonly known as:  COLACE  Take 1 capsule (100 mg total) by mouth 3 (three) times daily as needed for constipation.     HYDROcodone-acetaminophen 5-325 MG per tablet  Commonly known as:  NORCO/VICODIN  Take 1 tablet by mouth every 6 (six) hours as needed for pain.     insulin aspart 100 UNIT/ML injection  Commonly known as:  novoLOG  Inject 2-6 Units into the skin every 4 (four) hours.     methocarbamol 500 MG tablet  Commonly known as:  ROBAXIN  Take 1 tablet (500 mg total) by mouth 3 (three) times daily as needed.     morphine 2 MG/ML injection  Inject 0.5-2 mLs (1-4 mg total) into the vein every 3 (three) hours as needed.     multivitamin with minerals tablet  Take 1 tablet by mouth daily.     ondansetron 4 MG tablet  Commonly known as:  ZOFRAN  Take 1 tablet (4 mg total) by mouth every 8 (eight) hours as needed for nausea.     Oxycodone HCl 10 MG Tabs  Take 1 tablet (10 mg total) by mouth every 4 (four) hours as needed.     piperacillin-tazobactam 3.375 GM/50ML IVPB  Commonly known as:  ZOSYN  Inject 50 mLs (3.375 g  total) into the vein every 8 (eight) hours.     polyethylene glycol packet  Commonly known as:  MIRALAX / GLYCOLAX  Take 17 g by mouth daily.     simvastatin 20 MG tablet  Commonly known as:  ZOCOR  Take 10 mg by mouth every evening.     sodium chloride 0.9 % infusion  Inject 250 mLs into the vein continuous.     tamsulosin 0.4 MG Caps capsule  Commonly known as:  FLOMAX  Take 0.4 mg by mouth daily.         Discharged Condition: fair  Physician Statement:   The Patient was personally examined, the discharge assessment and plan has been personally reviewed and I agree with ACNP Babcock's assessment and plan. > 30 minutes of time have been dedicated to discharge assessment, planning and discharge instructions.   Signed: BABCOCK,PETE 04/15/2013, 2:20 PM  Lonia Farber, MD Pulmonary and Critical Care Medicine [facility] Pager: [phone]

## 2013-04-15 NOTE — Telephone Encounter (Addendum)
Message copied by Fredrich Birks on Fri Apr 15, 2013  1:16 PM ------      Message from: Melene Plan      Created: Fri Apr 15, 2013 10:06 AM                   ----- Message -----         From: Fransisco Hertz, MD         Sent: 04/15/2013   6:48 AM           To: Melene Plan, RN            [name] [name]      [id number]      1933/11/05            Needs follow up in four weeks with Dr. Edilia Bo with BLE venous duplex at that time. ------  04/15/13: patient does not have an answering machine, I have mailed a letter to patients home address, [place]

## 2013-04-15 NOTE — Consult Note (Signed)
WOC consult Note Reason for Consult: first post op change at the bedside with Dr. Shon Baton. Upper spinal incision is closed with distal portion open and laces present with staples to hold lower portion in place.  Wound type: incisional NPWT upper wound and traditional NPWT to the lower incision.  Pressure Ulcer POA: Yes (noted to have previous Stage II pressure ulcer right inner gluteal skin fold, and Stage II pressure ulcer left inner gluteal skin fold. Measurement: Spinal incision: upper closed portion 13cm, lower open area 19.5cm x 5cm x 4cm  Wound UJW:JXBJY with some fibrinous material in the base, noted deeper aspect 3 oclock on the open portion of the wound, per Dr. Shon Baton no need to fill this depth with VAC sponge.  Drainage (amount, consistency, odor) serosanguinous in VAC canister.  Oozing with dressing change, not significant.  Periwound: intact Dressing procedure/placement/frequency: 1pc. Of Mepitel placed over the upper closed incision with black foam over this making sure that is is more narrow that the Mepitel. 2pc of black foam weave under laces to fill lower portion of the wound that is open. Was able to fill space and leave the laces intact. Seal at , pt tolerated well.  Pt to transfer today to [facility] ([facility]), made PA with Dr. Shon Baton aware of this transfer which will mean wound care nurse at [facility] will assume his care of this wound. With first dressing change due Monday.   Discussed POC with patient and bedside nurse.  Re consult if needed, will not follow at this time. Thanks  Drury [employer], CWOCN [phone])
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
## 2013-04-15 NOTE — Progress Notes (Signed)
[facility] ADULT ICU REPLACEMENT PROTOCOL FOR AM LAB REPLACEMENT ONLY  The patient does apply for the [facility] Adult ICU Electrolyte Replacment Protocol based on the criteria listed below:   1. Is GFR >/= 40 ml/min? yes  Patient's GFR today is 83 2. Is urine output >/= 0.5 ml/kg/hr for the last 6 hours? yes Patient's UOP is  ml/kg/hr 3. Is BUN < 60 mg/dL? yes  Patient's BUN today is 29 4. Abnormal electrolyte(s): Mg 1.6 5. Ordered repletion with:see order 6. If a panic level lab has been reported, has the CCM MD in charge been notified? yes.   Physician:  Dr. Higinio Plan, Heran Campau A 04/15/2013 7:23 AM

## 2013-04-15 NOTE — Progress Notes (Signed)
OT Cancellation Note  Patient Details Name: [name] [name] MRN: [id number] DOB: 1933/12/16   Cancelled Treatment:    Reason Eval/Treat Not Completed: Other (comment) See PT note. Will wait until pt has brace to assess.  [facility] [name], OTR/L  [phone] 04/15/2013 04/15/2013, 11:42 AM

## 2013-04-15 NOTE — Progress Notes (Signed)
    Subjective: Procedure(s) (LRB): INSERTION OF VENA CAVA FILTER (N/A) 1 Day Post-Op  Patient reports pain as 4 on 0-10 scale.  Reports unchanged leg pain reports incisional back pain   Positive void Positive bowel movement Positive flatus Negative chest pain or shortness of breath  Objective: Vital signs in last 24 hours: Temp:  [97.1 F (36.2 C)-98.2 F (36.8 C)] 97.8 F (36.6 C) (08/29 1207) Pulse Rate:  [71-103] 93 (08/29 1200) Resp:  [15-31] 29 (08/29 1200) BP: (102-146)/(41-82) 130/48 mmHg (08/29 1200) SpO2:  [96 %-100 %] 97 % (08/29 1200)  Intake/Output from previous day: 08/28 0701 - 08/29 0700 In: 4387 [P.O.:540; I.V.:2730; Blood:717; IV Piggyback:400] Out: 1620 [Urine:1180; Drains:440]  Labs:  Recent Labs  04/14/13 1730 04/15/13 0414  WBC 5.0 4.8  RBC 2.30* 2.63*  HCT 20.8* 23.2*  PLT 75* 75*    Recent Labs  04/14/13 0455 04/15/13 0414  NA 145 146*  K 3.0* 3.6  CL 115* 117*  CO2 20 22  BUN 39* 29*  CREATININE 0.97 0.84  GLUCOSE 89 69*  CALCIUM 8.3* 8.1*    Recent Labs  04/14/13 0455 04/14/13 1730  INR 2.15* 1.76*    Physical Exam: Neurologically intact ABD soft Intact pulses distally Compartment soft Wound: no purulent drainage.  Wound edges intact no necrosis  Assessment/Plan: Patient stable  xrays n/a Continue mobilization with physical therapy Continue care  Up with therapy Mobilization ok from spine stand point Dressing changed with wound care nurse today Will start M/W/F vac change Continue IV abx   Venita Lick, MD [facility] [phone]

## 2013-04-15 NOTE — Progress Notes (Signed)
[facility]  Daily Progress Note  Assessment/Planning: POD #1 s/p IVC Filter placement   As recommended previously, B TED hose and repeat venous duplex in one week to see if calf DVT have extended proximally.  Hopefully, both calf DVT and the R arm DVT and SVT resolve with time.  Once this occurs, the IVC filter can be retrieved.  Pt can follow up with Dr. Edilia Bo in 4 weeks in the office [phone])  Subjective  - 1 Day Post-Op  Continued weak  Objective Filed Vitals:   04/15/13 0300 04/15/13 0402 04/15/13 0500 04/15/13 0600  BP: 136/53  130/58 135/52  Pulse: 83  78 78  Temp:  97.7 F (36.5 C)    TempSrc:  Oral    Resp: 22  21 27   Height:      Weight:      SpO2: 97%  98% 99%    Intake/Output Summary (Last 24 hours) at 04/15/13 0646 Last data filed at 04/15/13 0600  Gross per 24 hour  Intake   5032 ml  Output    770 ml  Net   4262 ml   VASC  R groin without hematoma, no pseudoaneurysm  Laboratory CBC    Component Value Date/Time   WBC 4.8 04/15/2013 0414   HGB 8.0* 04/15/2013 0414   HCT 23.2* 04/15/2013 0414   PLT 75* 04/15/2013 0414    BMET    Component Value Date/Time   NA 146* 04/15/2013 0414   K 3.6 04/15/2013 0414   CL 117* 04/15/2013 0414   CO2 22 04/15/2013 0414   GLUCOSE 69* 04/15/2013 0414   BUN 29* 04/15/2013 0414   CREATININE 0.84 04/15/2013 0414   CREATININE 0.82 03/14/2013 0812   CALCIUM 8.1* 04/15/2013 0414   GFRNONAA 83* 04/15/2013 0414   GFRAA >90 04/15/2013 0414    Leonides Sake, MD [facility] [place] Office: [phone] Pager: [phone]  04/15/2013, 6:46 AM

## 2013-04-16 ENCOUNTER — Other Ambulatory Visit (HOSPITAL_COMMUNITY): Payer: Self-pay

## 2013-04-16 LAB — COMPREHENSIVE METABOLIC PANEL
ALT: 21 U/L (ref 0–53)
AST: 22 U/L (ref 0–37)
Albumin: 1.5 g/dL — ABNORMAL LOW (ref 3.5–5.2)
Alkaline Phosphatase: 58 U/L (ref 39–117)
BUN: 21 mg/dL (ref 6–23)
Chloride: 116 mEq/L — ABNORMAL HIGH (ref 96–112)
Potassium: 3.6 mEq/L (ref 3.5–5.1)
Total Bilirubin: 2.3 mg/dL — ABNORMAL HIGH (ref 0.3–1.2)

## 2013-04-16 LAB — T4, FREE: Free T4: 0.68 ng/dL — ABNORMAL LOW (ref 0.80–1.80)

## 2013-04-16 LAB — CBC WITH DIFFERENTIAL/PLATELET
Basophils Absolute: 0 10*3/uL (ref 0.0–0.1)
Basophils Relative: 0 % (ref 0–1)
HCT: 24.6 % — ABNORMAL LOW (ref 39.0–52.0)
Hemoglobin: 8.2 g/dL — ABNORMAL LOW (ref 13.0–17.0)
Lymphocytes Relative: 12 % (ref 12–46)
Monocytes Relative: 4 % (ref 3–12)
Neutro Abs: 4.8 10*3/uL (ref 1.7–7.7)
RDW: 21.1 % — ABNORMAL HIGH (ref 11.5–15.5)
WBC: 5.7 10*3/uL (ref 4.0–10.5)

## 2013-04-16 LAB — HEMOGLOBIN A1C: Hgb A1c MFr Bld: 5.2 % (ref <5.7)

## 2013-04-16 LAB — PROCALCITONIN: Procalcitonin: 0.22 ng/mL

## 2013-04-16 LAB — PRO B NATRIURETIC PEPTIDE: Pro B Natriuretic peptide (BNP): 872.7 pg/mL — ABNORMAL HIGH (ref 0–450)

## 2013-04-16 LAB — PROTIME-INR: INR: 1.92 — ABNORMAL HIGH (ref 0.00–1.49)

## 2013-04-16 LAB — T3, FREE: T3, Free: 1.5 pg/mL — ABNORMAL LOW (ref 2.3–4.2)

## 2013-04-16 LAB — PHOSPHORUS: Phosphorus: 2 mg/dL — ABNORMAL LOW (ref 2.3–4.6)

## 2013-04-16 LAB — TSH: TSH: 1.805 u[IU]/mL (ref 0.350–4.500)

## 2013-04-17 LAB — CULTURE, BLOOD (ROUTINE X 2): Culture: NO GROWTH

## 2013-04-17 LAB — CBC
Hemoglobin: 8.4 g/dL — ABNORMAL LOW (ref 13.0–17.0)
Platelets: 71 10*3/uL — ABNORMAL LOW (ref 150–400)
RBC: 2.79 MIL/uL — ABNORMAL LOW (ref 4.22–5.81)
WBC: 6.2 10*3/uL (ref 4.0–10.5)

## 2013-04-17 LAB — BASIC METABOLIC PANEL
CO2: 21 mEq/L (ref 19–32)
Glucose, Bld: 154 mg/dL — ABNORMAL HIGH (ref 70–99)
Potassium: 3.4 mEq/L — ABNORMAL LOW (ref 3.5–5.1)
Sodium: 142 mEq/L (ref 135–145)

## 2013-04-18 ENCOUNTER — Other Ambulatory Visit (HOSPITAL_COMMUNITY): Payer: Self-pay

## 2013-04-18 LAB — BASIC METABOLIC PANEL
BUN: 18 mg/dL (ref 6–23)
Chloride: 117 mEq/L — ABNORMAL HIGH (ref 96–112)
GFR calc Af Amer: >90 mL/min (ref >90)
Potassium: 3.8 mEq/L (ref 3.5–5.1)
Sodium: 145 mEq/L (ref 135–145)

## 2013-04-18 LAB — CBC
HCT: 25.1 % — ABNORMAL LOW (ref 39.0–52.0)
Hemoglobin: 8.4 g/dL — ABNORMAL LOW (ref 13.0–17.0)
RDW: 22.4 % — ABNORMAL HIGH (ref 11.5–15.5)
WBC: 7.2 10*3/uL (ref 4.0–10.5)

## 2013-04-18 LAB — MAGNESIUM: Magnesium: 1.7 mg/dL (ref 1.5–2.5)

## 2013-04-19 ENCOUNTER — Other Ambulatory Visit (HOSPITAL_COMMUNITY): Payer: Self-pay

## 2013-04-19 DIAGNOSIS — E119 Type 2 diabetes mellitus without complications: Secondary | ICD-10-CM

## 2013-04-19 DIAGNOSIS — T8140XA Infection following a procedure, unspecified, initial encounter: Secondary | ICD-10-CM

## 2013-04-19 DIAGNOSIS — I82409 Acute embolism and thrombosis of unspecified deep veins of unspecified lower extremity: Secondary | ICD-10-CM

## 2013-04-19 DIAGNOSIS — M869 Osteomyelitis, unspecified: Secondary | ICD-10-CM

## 2013-04-19 DIAGNOSIS — B9689 Other specified bacterial agents as the cause of diseases classified elsewhere: Secondary | ICD-10-CM

## 2013-04-19 LAB — BASIC METABOLIC PANEL
BUN: 19 mg/dL (ref 6–23)
Chloride: 117 mEq/L — ABNORMAL HIGH (ref 96–112)
GFR calc Af Amer: >90 mL/min (ref >90)
GFR calc Af Amer: >90 mL/min (ref >90)
GFR calc non Af Amer: >90 mL/min (ref >90)
GFR calc non Af Amer: >90 mL/min (ref >90)
Glucose, Bld: 130 mg/dL — ABNORMAL HIGH (ref 70–99)
Potassium: 3.8 mEq/L (ref 3.5–5.1)
Potassium: 4.2 mEq/L (ref 3.5–5.1)
Sodium: 145 mEq/L (ref 135–145)
Sodium: 147 mEq/L — ABNORMAL HIGH (ref 135–145)

## 2013-04-19 NOTE — Consult Note (Signed)
INFECTIOUS DISEASE CONSULT NOTE  Date of Admission:  04/15/2013  Date of Consult:  04/19/2013  Reason for Consult: Hijazi Referring Physician: Wound infection, osteomyelitis  Impression/Recommendation Wound infection  S marcescens Osteomyelitis DVTs, greenfield DM2   Would-  Continue anbx for 6 weeks from last I & D Change zosyn to cefepime  Comment-  Does not need anaerobic coverage as he has been debrided. At his last surgery, he had bone graft removed, could this have been a nidus for failure? Today is day 7 post I & D, he needs 35 more days.  Please call if questions,  Thank you so much for this interesting consult,   Johny Sax (pager) [phone] [email or web address]  [name] [name] is an 79 y.o. male.  HPI: 80 yo M with hx DM2, and of T12 burst fracture, T9-L3 fusion 03-16-13. He was admitted to [facility] 8-12 ---> 8-29 with worsening pain. He was found to have raised area at inferior wound site, hardware failure. On 8-13 he underwent removal of hardware, mild purulent fluid found. He was started on vanco/ceftaz post-operatively. He was seen by ID on 8-15 and started on vanco/cefepime. His Cx grew serratia macescens (R- cefazoli, I- cefoxitin). His anbx were changed to ceftriaxone on 04-02-13. He was d/c to SNF on 8-21 but returned on 8-25 with sepsis, cellulitis from his wound. He was changed to vanco/zosyn. He returned to OR 8-27 with further wound I & D and vac placement. No further Cx's.  Postoperatively, his course was complicated by anemia, B LE DVT as well as RUE DVT. He underwent greenfiled placement on 8-28.  [facility] to [facility] on 04[facility]13 and continued on zosyn.  He was found on 8-29 to have a 4.5 x 5 sacral decubitus.  Has been afebrile in [facility]  Past Medical History  Diagnosis Date  . Hypertension   . Diabetes mellitus without complication   . Chronic kidney disease     bph  . Arthritis   . Coronary artery disease   . Myocardial infarction     Past  Surgical History  Procedure Laterality Date  . Appendectomy    . Coronary artery bypass graft  11/09/2000    LIMA to LAD, SVG to RPDA, seq SVG to OM2 and D1 by [name] [facility]  . Lumbar fusion  03/15/2013    Dr Shon Baton  . Posterior lumbar fusion 4 level N/A 03/16/2013    Procedure: T9 - L3 POSTERIOR POSTERIOR SPINAL FUSION ;  Surgeon: Venita Lick, MD;  Location: [facility] OR;  Service: Orthopedics;  Laterality: N/A;  . Hardware removal N/A 03/30/2013    Procedure: HARDWARE REMOVAL/IRRIGATION & DEBRIDEMENT OF WOUND ON BACK;  Surgeon: Venita Lick, MD;  Location: [facility] OR;  Service: Orthopedics;  Laterality: N/A;  . Incision and drainage of wound N/A 04/13/2013    Procedure: IRRIGATION AND DEBRIDEMENT WOUND AND VAC DRESSING;  Surgeon: Venita Lick, MD;  Location: [facility] OR;  Service: Orthopedics;  Laterality: N/A;     No Known Allergies  Medications: Scheduled: zpsyn 4.5 g Q8h, flomax, lipitor, miralax, MVI, methocarbamol, docusate, VIt D, calcium + D3, pepcid,   Total days of antibiotics: 21 (zosyn)          Social History:  reports that he has quit smoking. He has never used smokeless tobacco. He reports that he does not drink alcohol or use illicit drugs.  Family History  Problem Relation Age of Onset  . Diabetes Mother   . Stroke Brother     x2  General ROS: see HPI. pt resting on exam.   There were no vitals taken for this visit. General appearance: alert, cooperative, no distress and pale Eyes: negative findings: pupils equal, round, reactive to light and accomodation Throat: normal findings: oropharynx pink & moist without lesions or evidence of thrush and abnormal findings: dry Neck: no adenopathy and supple, symmetrical, trachea midline Lungs: clear to auscultation bilaterally Chest wall: no tenderness, L chest central line is clean, nontender, no erythema.  Heart: regular rate and rhythm Abdomen: normal findings: bowel sounds normal and soft, non-tender Extremities:  edema >3+ UE Back wound is clean with vac in place.  MSE- +person, +place, +date.   Results for orders placed during the hospital encounter of 04/15/13 (from the past 48 hour(s))  CBC     Status: Abnormal   Collection Time    04/18/13  5:00 AM      Result Value Range   WBC 7.2  4.0 - 10.5 K/uL   RBC 2.72 (*) 4.22 - 5.81 MIL/uL   Hemoglobin 8.4 (*) 13.0 - 17.0 g/dL   HCT 16.1 (*) 09.6 - 04.5 %   MCV 92.3  78.0 - 100.0 fL   MCH 30.9  26.0 - 34.0 pg   MCHC 33.5  30.0 - 36.0 g/dL   RDW 40.9 (*) 81.1 - 91.4 %   Platelets 67 (*) 150 - 400 K/uL   Comment: CONSISTENT WITH PREVIOUS RESULT  BASIC METABOLIC PANEL     Status: Abnormal   Collection Time    04/18/13  5:00 AM      Result Value Range   Sodium 145  135 - 145 mEq/L   Potassium 3.8  3.5 - 5.1 mEq/L   Chloride 117 (*) 96 - 112 mEq/L   CO2 23  19 - 32 mEq/L   Glucose, Bld 114 (*) 70 - 99 mg/dL   BUN 18  6 - 23 mg/dL   Creatinine, Ser 7.82  0.50 - 1.35 mg/dL   Calcium 8.3 (*) 8.4 - 10.5 mg/dL   GFR calc non Af Amer >90  >90 mL/min   GFR calc Af Amer >90  >90 mL/min   Comment: (NOTE)     The eGFR has been calculated using the CKD EPI equation.     This calculation has not been validated in all clinical situations.     eGFR's persistently <90 mL/min signify possible Chronic Kidney     Disease.  PHOSPHORUS     Status: Abnormal   Collection Time    04/18/13  5:00 AM      Result Value Range   Phosphorus 2.1 (*) 2.3 - 4.6 mg/dL  MAGNESIUM     Status: None   Collection Time    04/18/13  5:00 AM      Result Value Range   Magnesium 1.7  1.5 - 2.5 mg/dL  BASIC METABOLIC PANEL     Status: Abnormal   Collection Time    04/19/13  5:00 AM      Result Value Range   Sodium 145  135 - 145 mEq/L   Potassium 3.8  3.5 - 5.1 mEq/L   Chloride 116 (*) 96 - 112 mEq/L   CO2 25  19 - 32 mEq/L   Glucose, Bld 130 (*) 70 - 99 mg/dL   BUN 17  6 - 23 mg/dL   Creatinine, Ser 9.56  0.50 - 1.35 mg/dL   Calcium 8.1 (*) 8.4 - 10.5 mg/dL   GFR calc  non Af  Amer >90  >90 mL/min   GFR calc Af Amer >90  >90 mL/min   Comment: (NOTE)     The eGFR has been calculated using the CKD EPI equation.     This calculation has not been validated in all clinical situations.     eGFR's persistently <90 mL/min signify possible Chronic Kidney     Disease.  BASIC METABOLIC PANEL     Status: Abnormal   Collection Time    04/19/13  2:03 PM      Result Value Range   Sodium 147 (*) 135 - 145 mEq/L   Potassium 4.2  3.5 - 5.1 mEq/L   Chloride 117 (*) 96 - 112 mEq/L   CO2 23  19 - 32 mEq/L   Glucose, Bld 163 (*) 70 - 99 mg/dL   BUN 19  6 - 23 mg/dL   Creatinine, Ser 4.54  0.50 - 1.35 mg/dL   Calcium 7.9 (*) 8.4 - 10.5 mg/dL   GFR calc non Af Amer >90  >90 mL/min   GFR calc Af Amer >90  >90 mL/min   Comment: (NOTE)     The eGFR has been calculated using the CKD EPI equation.     This calculation has not been validated in all clinical situations.     eGFR's persistently <90 mL/min signify possible Chronic Kidney     Disease.      Component Value Date/Time   SDES URINE, CATHETERIZED 04/11/2013 1551   SPECREQUEST NONE 04/11/2013 1551   CULT  Value: NO GROWTH Performed at [facility] 04/11/2013 1551   REPTSTATUS 04/12/2013 FINAL 04/11/2013 1551   US Abdomen Complete  04/18/2013   *RADIOLOGY REPORT*  Clinical Data:  Jaundice.  ABDOMEN ULTRASOUND  Technique:  Complete abdominal ultrasound examination was performed including evaluation of the liver, gallbladder, bile ducts, pancreas, kidneys, spleen, IVC, and abdominal aorta.  Comparison:  None  Findings:  Gallbladder:  No gallstones identified.  The gallbladder wall is accentuated by surrounding ascites.  Common Bile Duct:  Normal caliber with maximal diameter of 6 mm.  Liver:  Gross and likely advanced cirrhotic changes are identified of the liver which is shrunken in appearance and heterogeneous in echotexture.  No definite focal hepatic masses are identified. Color Doppler flow is detected in the main  portal vein.  IVC:  The IVC is very poorly visualized by ultrasound.  Pancreas:  The pancreas is very poorly visualized and essentially nonvisualized by ultrasound.  Spleen:  The spleen appears likely mildly enlarged and homogeneous in echotexture.  Kidneys:  The right kidney measures 10.5 cm and the left kidney 13.1 cm.  There is suggestion of cortical atrophy on the right.  No hydronephrosis or focal renal lesions are identified.  Abdominal Aorta:  Visualized segments of the abdominal aorta are normal caliber.  Ascites is present around the liver and gallbladder.  A small amount of ascites is seen adjacent to the spleen.  IMPRESSION: Changes by ultrasound consistent with advanced cirrhosis with associated ascites and suggestion of mild splenomegaly.  No definite hepatic masses are identified by ultrasound.   Original Report Authenticated By: Irish Lack, M.D.   Recent Results (from the past 240 hour(s))  CULTURE, BLOOD (ROUTINE X 2)     Status: None   Collection Time    04/11/13  3:18 PM      Result Value Range Status   Specimen Description BLOOD HAND LEFT   Final   Special Requests BOTTLES DRAWN AEROBIC ONLY 10CC  Final   Culture  Setup Time     Final   Value: 04/11/2013 23:11     Performed at [facility]   Culture     Final   Value: NO GROWTH 5 DAYS     Performed at [facility]   Report Status 04/17/2013 FINAL   Final  CULTURE, BLOOD (ROUTINE X 2)     Status: None   Collection Time    04/11/13  3:40 PM      Result Value Range Status   Specimen Description BLOOD HAND LEFT   Final   Special Requests BOTTLES DRAWN AEROBIC AND ANAEROBIC 10CC   Final   Culture  Setup Time     Final   Value: 04/11/2013 23:12     Performed at [facility]   Culture     Final   Value: NO GROWTH 5 DAYS     Performed at [facility]   Report Status 04/17/2013 FINAL   Final  URINE CULTURE     Status: None   Collection Time    04/11/13  3:51 PM      Result Value Range  Status   Specimen Description URINE, CATHETERIZED   Final   Special Requests NONE   Final   Culture  Setup Time     Final   Value: 04/11/2013 16:43     Performed at [facility] Count     Final   Value: NO GROWTH     Performed at [facility]   Culture     Final   Value: NO GROWTH     Performed at [facility]   Report Status 04/12/2013 FINAL   Final      04/19/2013, 3:43 PM     LOS: 4 days

## 2013-04-20 ENCOUNTER — Other Ambulatory Visit (HOSPITAL_COMMUNITY): Payer: Self-pay

## 2013-04-20 LAB — PRO B NATRIURETIC PEPTIDE: Pro B Natriuretic peptide (BNP): 569.7 pg/mL — ABNORMAL HIGH (ref 0–450)

## 2013-04-20 LAB — CBC
HCT: 25.1 % — ABNORMAL LOW (ref 39.0–52.0)
Hemoglobin: 8.1 g/dL — ABNORMAL LOW (ref 13.0–17.0)
RBC: 2.69 MIL/uL — ABNORMAL LOW (ref 4.22–5.81)
WBC: 7.2 10*3/uL (ref 4.0–10.5)

## 2013-04-20 LAB — MAGNESIUM: Magnesium: 1.7 mg/dL (ref 1.5–2.5)

## 2013-04-20 LAB — BASIC METABOLIC PANEL
CO2: 26 mEq/L (ref 19–32)
Chloride: 119 mEq/L — ABNORMAL HIGH (ref 96–112)
GFR calc Af Amer: >90 mL/min (ref >90)
Potassium: 3.8 mEq/L (ref 3.5–5.1)
Sodium: 150 mEq/L — ABNORMAL HIGH (ref 135–145)

## 2013-04-20 LAB — PHOSPHORUS: Phosphorus: 2.6 mg/dL (ref 2.3–4.6)

## 2013-04-21 LAB — CBC WITH DIFFERENTIAL/PLATELET
Basophils Absolute: 0 10*3/uL (ref 0.0–0.1)
Basophils Relative: 0 % (ref 0–1)
Eosinophils Absolute: 0.1 10*3/uL (ref 0.0–0.7)
HCT: 26.2 % — ABNORMAL LOW (ref 39.0–52.0)
Hemoglobin: 8.3 g/dL — ABNORMAL LOW (ref 13.0–17.0)
Lymphocytes Relative: 14 % (ref 12–46)
MCHC: 31.7 g/dL (ref 30.0–36.0)
Monocytes Relative: 7 % (ref 3–12)
Neutro Abs: 4.7 10*3/uL (ref 1.7–7.7)
Neutrophils Relative %: 78 % — ABNORMAL HIGH (ref 43–77)
WBC: 6 10*3/uL (ref 4.0–10.5)

## 2013-04-21 LAB — BASIC METABOLIC PANEL
CO2: 27 mEq/L (ref 19–32)
Calcium: 8.1 mg/dL — ABNORMAL LOW (ref 8.4–10.5)
GFR calc non Af Amer: >90 mL/min (ref >90)
Glucose, Bld: 117 mg/dL — ABNORMAL HIGH (ref 70–99)
Potassium: 3.7 mEq/L (ref 3.5–5.1)
Sodium: 146 mEq/L — ABNORMAL HIGH (ref 135–145)

## 2013-04-21 NOTE — Progress Notes (Signed)
Subjective: Drowsy this morning.    Objective: Vital signs in last 24 hours:    Intake/Output from previous day:   Intake/Output this shift:     Recent Labs  04/20/13 0500 04/21/13 0520  HGB 8.1* 8.3*    Recent Labs  04/20/13 0500 04/21/13 0520  WBC 7.2 6.0  RBC 2.69* 2.74*  HCT 25.1* 26.2*  PLT 80* 73*    Recent Labs  04/20/13 0500 04/21/13 0520  NA 150* 146*  K 3.8 3.7  CL 119* 114*  CO2 26 27  BUN 16 18  CREATININE 0.58 0.59  GLUCOSE 117* 117*  CALCIUM 8.0* 8.1*   No results found for this basename: LABPT, INR,  in the last 72 hours  Exam.  Wound vac intact.  Putting out serosang drainage.  Wound looking better.    Assessment/Plan: Continue present care.     [name] [name] M 04/21/2013, 8:46 AM

## 2013-04-22 ENCOUNTER — Other Ambulatory Visit (HOSPITAL_COMMUNITY): Payer: Self-pay

## 2013-04-22 LAB — CBC
Hemoglobin: 7.6 g/dL — ABNORMAL LOW (ref 13.0–17.0)
Platelets: 69 10*3/uL — ABNORMAL LOW (ref 150–400)
RBC: 2.52 MIL/uL — ABNORMAL LOW (ref 4.22–5.81)
WBC: 5.4 10*3/uL (ref 4.0–10.5)

## 2013-04-22 LAB — BASIC METABOLIC PANEL
CO2: 28 mEq/L (ref 19–32)
Chloride: 114 mEq/L — ABNORMAL HIGH (ref 96–112)
Glucose, Bld: 91 mg/dL (ref 70–99)
Potassium: 3.7 mEq/L (ref 3.5–5.1)
Sodium: 145 mEq/L (ref 135–145)

## 2013-04-22 LAB — PREPARE RBC (CROSSMATCH)

## 2013-04-23 LAB — CBC WITH DIFFERENTIAL/PLATELET
Basophils Relative: 0 % (ref 0–1)
Eosinophils Relative: 1 % (ref 0–5)
Hemoglobin: 8 g/dL — ABNORMAL LOW (ref 13.0–17.0)
Monocytes Relative: 5 % (ref 3–12)
Neutrophils Relative %: 75 % (ref 43–77)
Platelets: 81 10*3/uL — ABNORMAL LOW (ref 150–400)
RBC: 2.62 MIL/uL — ABNORMAL LOW (ref 4.22–5.81)
WBC: 6.5 10*3/uL (ref 4.0–10.5)

## 2013-04-24 DIAGNOSIS — Z86718 Personal history of other venous thrombosis and embolism: Secondary | ICD-10-CM

## 2013-04-24 NOTE — Progress Notes (Addendum)
*  Preliminary Results* Previous bilateral lower extremity venous duplex completed 04/14/13 revealed deep vein thrombosis involving bilateral gastrocnemius veins.  Bilateral lower extremity venous duplex completed 04/24/2013. The right lower extremity is positive for deep vein thrombosis. There is still DVT noted in the right gastrocnemius veins, as well as the right popliteal, right posterior tibial and right peroneal veins. The left lower extremity is negative for deep vein thrombosis. DVT of the left gastrocnemius veins seen on 04/14/13 seems to have resolved at this time. No Baker's cyst visualized bilaterally.  Preliminary results discussed with Lucienne Minks, RN.  04/24/2013 11:27 AM Gertie Fey, RVT, RDCS, RDMS

## 2013-04-25 LAB — COMPREHENSIVE METABOLIC PANEL
Albumin: 1.4 g/dL — ABNORMAL LOW (ref 3.5–5.2)
Alkaline Phosphatase: 55 U/L (ref 39–117)
BUN: 15 mg/dL (ref 6–23)
CO2: 30 mEq/L (ref 19–32)
Chloride: 106 mEq/L (ref 96–112)
Glucose, Bld: 110 mg/dL — ABNORMAL HIGH (ref 70–99)
Potassium: 4 mEq/L (ref 3.5–5.1)
Total Bilirubin: 1.4 mg/dL — ABNORMAL HIGH (ref 0.3–1.2)

## 2013-04-25 LAB — URINALYSIS, ROUTINE W REFLEX MICROSCOPIC
Glucose, UA: NEGATIVE mg/dL
Hgb urine dipstick: NEGATIVE
Ketones, ur: 15 mg/dL — AB
Leukocytes, UA: NEGATIVE
Protein, ur: NEGATIVE mg/dL

## 2013-04-25 LAB — CBC WITH DIFFERENTIAL/PLATELET
Basophils Absolute: 0 10*3/uL (ref 0.0–0.1)
Basophils Relative: 0 % (ref 0–1)
Eosinophils Relative: 1 % (ref 0–5)
HCT: 24.6 % — ABNORMAL LOW (ref 39.0–52.0)
Hemoglobin: 7.8 g/dL — ABNORMAL LOW (ref 13.0–17.0)
Lymphocytes Relative: 16 % (ref 12–46)
Monocytes Relative: 6 % (ref 3–12)
Neutro Abs: 4.5 10*3/uL (ref 1.7–7.7)
RBC: 2.62 MIL/uL — ABNORMAL LOW (ref 4.22–5.81)
WBC: 5.9 10*3/uL (ref 4.0–10.5)

## 2013-04-26 ENCOUNTER — Other Ambulatory Visit (HOSPITAL_COMMUNITY): Payer: Medicare Other

## 2013-04-26 LAB — TYPE AND SCREEN
ABO/RH(D): A POS
Antibody Screen: NEGATIVE
Unit division: 0
Unit division: 0

## 2013-04-26 LAB — CBC
Platelets: 129 10*3/uL — ABNORMAL LOW (ref 150–400)
RBC: 2.62 MIL/uL — ABNORMAL LOW (ref 4.22–5.81)
WBC: 7.4 10*3/uL (ref 4.0–10.5)

## 2013-04-26 LAB — SEDIMENTATION RATE: Sed Rate: 30 mm/hr — ABNORMAL HIGH (ref 0–16)

## 2013-04-26 LAB — COMPREHENSIVE METABOLIC PANEL
ALT: 15 U/L (ref 0–53)
AST: 19 U/L (ref 0–37)
Calcium: 8.3 mg/dL — ABNORMAL LOW (ref 8.4–10.5)
Sodium: 142 mEq/L (ref 135–145)
Total Protein: 4.7 g/dL — ABNORMAL LOW (ref 6.0–8.3)

## 2013-04-26 LAB — C-REACTIVE PROTEIN: CRP: 6.5 mg/dL — ABNORMAL HIGH (ref <0.60)

## 2013-04-26 LAB — PREALBUMIN: Prealbumin: 5.9 mg/dL — ABNORMAL LOW (ref 17.0–34.0)

## 2013-04-27 ENCOUNTER — Emergency Department (HOSPITAL_COMMUNITY)
Admission: EM | Admit: 2013-04-27 | Discharge: 2013-04-27 | Discharge status: Against Medical Advice | Payer: Medicare Other | Attending: Emergency Medicine | Admitting: Emergency Medicine

## 2013-04-27 ENCOUNTER — Encounter (HOSPITAL_COMMUNITY): Payer: Self-pay | Admitting: Emergency Medicine

## 2013-04-27 ENCOUNTER — Other Ambulatory Visit (HOSPITAL_COMMUNITY): Payer: Medicare Other

## 2013-04-27 ENCOUNTER — Inpatient Hospital Stay
Admission: RE | Admit: 2013-04-27 | Discharge: 2013-05-04 | Disposition: A | Discharge status: Other Acute Inpatient Hospital | Payer: Medicare Other | Source: Ambulatory Visit | Attending: Internal Medicine | Admitting: Internal Medicine

## 2013-04-27 DIAGNOSIS — T819XXA Unspecified complication of procedure, initial encounter: Secondary | ICD-10-CM | POA: Insufficient documentation

## 2013-04-27 DIAGNOSIS — Y849 Medical procedure, unspecified as the cause of abnormal reaction of the patient, or of later complication, without mention of misadventure at the time of the procedure: Secondary | ICD-10-CM | POA: Insufficient documentation

## 2013-04-27 LAB — URINE CULTURE: Special Requests: NORMAL

## 2013-04-27 NOTE — ED Notes (Addendum)
Pt here from [facility] floor upstairs, per pt RN, pt had back surgery 2 weeks ago and is to see the surgeon for evaluation of the wound. Dr. Shon Baton, PA to see pt for possible wound closure. Pt lethargic and confused at baseline per caregiver, pt presents with 98.4 temp rectally. Upon arrival, wound vac in place, [facility] staff at bedside will continue to monitor. [facility] staff also denies any fever.

## 2013-04-27 NOTE — ED Notes (Signed)
Dr. Shon Baton PA  at bedside.

## 2013-04-28 LAB — CBC WITH DIFFERENTIAL/PLATELET
Basophils Relative: 0 % (ref 0–1)
Eosinophils Absolute: 0.1 10*3/uL (ref 0.0–0.7)
Eosinophils Relative: 1 % (ref 0–5)
HCT: 24.3 % — ABNORMAL LOW (ref 39.0–52.0)
Hemoglobin: 7.5 g/dL — ABNORMAL LOW (ref 13.0–17.0)
Lymphocytes Relative: 17 % (ref 12–46)
MCH: 29.8 pg (ref 26.0–34.0)
MCHC: 30.9 g/dL (ref 30.0–36.0)
Monocytes Absolute: 0.3 10*3/uL (ref 0.1–1.0)
Neutro Abs: 4.1 10*3/uL (ref 1.7–7.7)

## 2013-04-28 LAB — BASIC METABOLIC PANEL
BUN: 28 mg/dL — ABNORMAL HIGH (ref 6–23)
CO2: 28 mEq/L (ref 19–32)
GFR calc non Af Amer: >90 mL/min (ref >90)
Glucose, Bld: 106 mg/dL — ABNORMAL HIGH (ref 70–99)
Potassium: 3.7 mEq/L (ref 3.5–5.1)

## 2013-04-28 NOTE — Consult Note (Signed)
Reason for Consult: Evaluation status post placement of wound VAC for infection deep retained hardware for stabilization for burst fracture thoracic and lumbar spine. Referring Physician: [name] is an 79 y.o. male.  HPI: Patient is a 73 old gentleman who sustained a burst fracture. He initially underwent internal fixation for stabilization the burst fracture. Patient subtotally had failure of the hardware this was removed and patient underwent to irrigation and debridements for deep infection. Patient is currently being treated with a wound VAC for wound closure nutrition supplementation and IV antibiotics.  Past Medical History  Diagnosis Date  . Hypertension   . Diabetes mellitus without complication   . Chronic kidney disease     bph  . Arthritis   . Coronary artery disease   . Myocardial infarction     Past Surgical History  Procedure Laterality Date  . Appendectomy    . Coronary artery bypass graft  11/09/2000    LIMA to LAD, SVG to RPDA, seq SVG to OM2 and D1 by [name] [facility]  . Lumbar fusion  03/15/2013    Dr Shon Baton  . Posterior lumbar fusion 4 level N/A 03/16/2013    Procedure: T9 - L3 POSTERIOR POSTERIOR SPINAL FUSION ;  Surgeon: Venita Lick, MD;  Location: [facility] OR;  Service: Orthopedics;  Laterality: N/A;  . Hardware removal N/A 03/30/2013    Procedure: HARDWARE REMOVAL/IRRIGATION & DEBRIDEMENT OF WOUND ON BACK;  Surgeon: Venita Lick, MD;  Location: [facility] OR;  Service: Orthopedics;  Laterality: N/A;  . Incision and drainage of wound N/A 04/13/2013    Procedure: IRRIGATION AND DEBRIDEMENT WOUND AND VAC DRESSING;  Surgeon: Venita Lick, MD;  Location: [facility];  Service: Orthopedics;  Laterality: N/A;    Family History  Problem Relation Age of Onset  . Diabetes Mother   . Stroke Brother     x2    Social History:  reports that he has quit smoking. He has never used smokeless tobacco. He reports that he does not drink alcohol or use illicit  drugs.  Allergies: No Known Allergies  Medications: I have reviewed the patient's current medications.  Results for orders placed during the hospital encounter of 04/27/13 (from the past 48 hour(s))  CBC WITH DIFFERENTIAL     Status: Abnormal   Collection Time    04/28/13  5:00 AM      Result Value Range   WBC 5.4  4.0 - 10.5 K/uL   RBC 2.52 (*) 4.22 - 5.81 MIL/uL   Hemoglobin 7.5 (*) 13.0 - 17.0 g/dL   HCT 21.3 (*) 08.6 - 57.8 %   MCV 96.4  78.0 - 100.0 fL   MCH 29.8  26.0 - 34.0 pg   MCHC 30.9  30.0 - 36.0 g/dL   RDW 46.9 (*) 62.9 - 52.8 %   Platelets 82 (*) 150 - 400 K/uL   Comment: PLATELET COUNT CONFIRMED BY SMEAR   Neutrophils Relative % 76  43 - 77 %   Lymphocytes Relative 17  12 - 46 %   Monocytes Relative 6  3 - 12 %   Eosinophils Relative 1  0 - 5 %   Basophils Relative 0  0 - 1 %   Neutro Abs 4.1  1.7 - 7.7 K/uL   Lymphs Abs 0.9  0.7 - 4.0 K/uL   Monocytes Absolute 0.3  0.1 - 1.0 K/uL   Eosinophils Absolute 0.1  0.0 - 0.7 K/uL   Basophils Absolute 0.0  0.0 - 0.1  K/uL   RBC Morphology POLYCHROMASIA PRESENT    BASIC METABOLIC PANEL     Status: Abnormal   Collection Time    04/28/13  5:00 AM      Result Value Range   Sodium 145  135 - 145 mEq/L   Potassium 3.7  3.5 - 5.1 mEq/L   Chloride 112  96 - 112 mEq/L   CO2 28  19 - 32 mEq/L   Glucose, Bld 106 (*) 70 - 99 mg/dL   BUN 28 (*) 6 - 23 mg/dL   Creatinine, Ser 6.21  0.50 - 1.35 mg/dL   Calcium 8.2 (*) 8.4 - 10.5 mg/dL   GFR calc non Af Amer >90  >90 mL/min   GFR calc Af Amer >90  >90 mL/min   Comment: (NOTE)     The eGFR has been calculated using the CKD EPI equation.     This calculation has not been validated in all clinical situations.     eGFR's persistently <90 mL/min signify possible Chronic Kidney     Disease.    Ct Head Wo Contrast  04/26/2013   *RADIOLOGY REPORT*  Clinical Data: Increased confusion.  Back surgery on 03/16/2013.  CT HEAD WITHOUT CONTRAST  Technique:  Contiguous axial images were  obtained from the base of the skull through the vertex without contrast.  Comparison: None.  Findings: The ventricles are normal in size, the ventricles are normal in configuration.  There is ventricular and sulcal enlargement reflecting mild atrophy.  There is a well-defined focus of hypoattenuation in the right centrum semiovale only consistent with a small old lacunar infarct.  A tiny old lacunar infarct is suggested along the medial left caudate nucleus head.  There are patchy areas of predominantly periventricular white matter hypoattenuation, which are most consistent with chronic microvascular ischemic change.  There are no parenchymal masses or mass effect.  There is no evidence of a recent transcortical infarct.  There are no extra-axial masses or abnormal fluid collections.  There is no intracranial hemorrhage.  The sinuses and mastoid air cells are clear.  No skull lesion.  IMPRESSION: No acute intracranial abnormalities.  Mild atrophy and chronic microvascular ischemic change.  Small lacunar infarcts in the right centrum semiovale only and left caudate nucleus head.   Original Report Authenticated By: Amie Portland, M.D.   Dg Chest Port 1 View  04/27/2013   *RADIOLOGY REPORT*  Clinical Data: Dyspnea  PORTABLE CHEST - 1 VIEW  Comparison: 04/22/2013  Findings: Bibasilar heterogeneous opacities unchanged.  Left subclavian central venous catheter unchanged.  No pneumothorax. Low volumes.  Left pleural effusions suspected.  IMPRESSION: Stable.  Bibasilar airspace disease.  Left effusion.  Low volumes.   Original Report Authenticated By: Jolaine Click, M.D.    Review of Systems  All other systems reviewed and are negative.   There were no vitals taken for this visit. Physical Exam On examination the wound VAC is functioning properly -120 mm of suction. There is clear serosanguineous drainage no signs of infection. The wound edges show no cellulitis. Assessment/Plan: Assessment: Severe malnutrition  with wound VAC for wound closure status post infection from deep retained hardware for stabilization of the lumbar thoracic spine for burst fracture subsequent removal of the hardware and irrigation debridement surgically x2.  Plan: Continue wound VAC continue IV antibiotics continue nutrition supplement.  [name],[name] V 04/28/2013, 6:19 AM

## 2013-04-29 ENCOUNTER — Other Ambulatory Visit (HOSPITAL_COMMUNITY): Payer: Medicare Other

## 2013-04-29 LAB — CBC
Hemoglobin: 8.2 g/dL — ABNORMAL LOW (ref 13.0–17.0)
MCH: 30.6 pg (ref 26.0–34.0)
MCH: 30.7 pg (ref 26.0–34.0)
MCHC: 31.9 g/dL (ref 30.0–36.0)
MCHC: 31.9 g/dL (ref 30.0–36.0)
MCV: 95.9 fL (ref 78.0–100.0)
MCV: 96.2 fL (ref 78.0–100.0)
Platelets: 103 10*3/uL — ABNORMAL LOW (ref 150–400)
RDW: 22.4 % — ABNORMAL HIGH (ref 11.5–15.5)

## 2013-04-29 LAB — BASIC METABOLIC PANEL
BUN: 25 mg/dL — ABNORMAL HIGH (ref 6–23)
CO2: 29 mEq/L (ref 19–32)
Calcium: 8.6 mg/dL (ref 8.4–10.5)
Calcium: 8.3 mg/dL — ABNORMAL LOW (ref 8.4–10.5)
Creatinine, Ser: 0.65 mg/dL (ref 0.50–1.35)
Creatinine, Ser: 0.67 mg/dL (ref 0.50–1.35)
GFR calc non Af Amer: >90 mL/min (ref >90)
GFR calc non Af Amer: >90 mL/min (ref >90)
Glucose, Bld: 103 mg/dL — ABNORMAL HIGH (ref 70–99)
Sodium: 145 mEq/L (ref 135–145)
Sodium: 144 mEq/L (ref 135–145)

## 2013-04-30 LAB — BASIC METABOLIC PANEL
BUN: 24 mg/dL — ABNORMAL HIGH (ref 6–23)
Calcium: 8.4 mg/dL (ref 8.4–10.5)
Chloride: 110 mEq/L (ref 96–112)
Creatinine, Ser: 0.67 mg/dL (ref 0.50–1.35)
GFR calc Af Amer: >90 mL/min (ref >90)
GFR calc non Af Amer: >90 mL/min (ref >90)

## 2013-04-30 LAB — CBC
MCH: 30.1 pg (ref 26.0–34.0)
MCHC: 31 g/dL (ref 30.0–36.0)
MCV: 96.9 fL (ref 78.0–100.0)
Platelets: 100 10*3/uL — ABNORMAL LOW (ref 150–400)
RDW: 22.4 % — ABNORMAL HIGH (ref 11.5–15.5)
WBC: 6.9 10*3/uL (ref 4.0–10.5)

## 2013-04-30 LAB — PREPARE RBC (CROSSMATCH)

## 2013-05-01 LAB — COMPREHENSIVE METABOLIC PANEL
ALT: 15 U/L (ref 0–53)
AST: 17 U/L (ref 0–37)
Albumin: 1.6 g/dL — ABNORMAL LOW (ref 3.5–5.2)
CO2: 26 mEq/L (ref 19–32)
Calcium: 8.2 mg/dL — ABNORMAL LOW (ref 8.4–10.5)
Chloride: 112 mEq/L (ref 96–112)
Creatinine, Ser: 0.69 mg/dL (ref 0.50–1.35)
GFR calc non Af Amer: 90 mL/min — ABNORMAL LOW (ref >90)
Sodium: 144 mEq/L (ref 135–145)
Total Bilirubin: 1.6 mg/dL — ABNORMAL HIGH (ref 0.3–1.2)

## 2013-05-01 LAB — CBC
Platelets: 119 10*3/uL — ABNORMAL LOW (ref 150–400)
RBC: 2.93 MIL/uL — ABNORMAL LOW (ref 4.22–5.81)
RDW: 23.9 % — ABNORMAL HIGH (ref 11.5–15.5)
WBC: 7.1 10*3/uL (ref 4.0–10.5)

## 2013-05-01 LAB — TYPE AND SCREEN: Unit division: 0

## 2013-05-03 DIAGNOSIS — Z48812 Encounter for surgical aftercare following surgery on the circulatory system: Secondary | ICD-10-CM

## 2013-05-03 LAB — CBC
Hemoglobin: 9.2 g/dL — ABNORMAL LOW (ref 13.0–17.0)
MCH: 30 pg (ref 26.0–34.0)
MCHC: 31.9 g/dL (ref 30.0–36.0)
MCV: 93.8 fL (ref 78.0–100.0)
RBC: 3.07 MIL/uL — ABNORMAL LOW (ref 4.22–5.81)

## 2013-05-03 LAB — BASIC METABOLIC PANEL
CO2: 26 mEq/L (ref 19–32)
Calcium: 8.4 mg/dL (ref 8.4–10.5)
Creatinine, Ser: 0.59 mg/dL (ref 0.50–1.35)
GFR calc non Af Amer: >90 mL/min (ref >90)
Glucose, Bld: 109 mg/dL — ABNORMAL HIGH (ref 70–99)
Sodium: 145 mEq/L (ref 135–145)

## 2013-05-03 NOTE — Progress Notes (Signed)
VASCULAR LAB PRELIMINARY  PRELIMINARY  PRELIMINARY  PRELIMINARY  Bilateral upper extremity venous duplex completed.    Preliminary report:  Right:  Previous DVT in brachial vein appears resolved.  There appears to be residual non occlusive superficial thrombus in the basilic vein.  All other veins appear patent.  Left:  Unable to image jugular, axillary and basilic veins due to patient position.  All other veins appear patent.  Larson Limones, RVT 05/03/2013, 4:15 PM

## 2013-05-04 ENCOUNTER — Inpatient Hospital Stay (HOSPITAL_COMMUNITY)
Admission: EM | Admit: 2013-05-04 | Discharge: 2013-05-06 | DRG: 862 | Disposition: A | Discharge status: Other Acute Inpatient Hospital | Payer: Medicare Other | Attending: Orthopedic Surgery | Admitting: Orthopedic Surgery

## 2013-05-04 ENCOUNTER — Inpatient Hospital Stay (HOSPITAL_COMMUNITY): Payer: Medicare Other

## 2013-05-04 ENCOUNTER — Encounter (HOSPITAL_COMMUNITY): Payer: Self-pay | Admitting: Emergency Medicine

## 2013-05-04 DIAGNOSIS — M545 Low back pain, unspecified: Secondary | ICD-10-CM | POA: Diagnosis present

## 2013-05-04 DIAGNOSIS — I10 Essential (primary) hypertension: Secondary | ICD-10-CM | POA: Diagnosis present

## 2013-05-04 DIAGNOSIS — M869 Osteomyelitis, unspecified: Secondary | ICD-10-CM | POA: Diagnosis present

## 2013-05-04 DIAGNOSIS — T8140XA Infection following a procedure, unspecified, initial encounter: Principal | ICD-10-CM | POA: Diagnosis present

## 2013-05-04 DIAGNOSIS — Z85828 Personal history of other malignant neoplasm of skin: Secondary | ICD-10-CM

## 2013-05-04 DIAGNOSIS — E119 Type 2 diabetes mellitus without complications: Secondary | ICD-10-CM | POA: Diagnosis present

## 2013-05-04 DIAGNOSIS — Y831 Surgical operation with implant of artificial internal device as the cause of abnormal reaction of the patient, or of later complication, without mention of misadventure at the time of the procedure: Secondary | ICD-10-CM | POA: Diagnosis present

## 2013-05-04 DIAGNOSIS — M4620 Osteomyelitis of vertebra, site unspecified: Secondary | ICD-10-CM | POA: Diagnosis present

## 2013-05-04 DIAGNOSIS — E43 Unspecified severe protein-calorie malnutrition: Secondary | ICD-10-CM | POA: Diagnosis present

## 2013-05-04 DIAGNOSIS — B9689 Other specified bacterial agents as the cause of diseases classified elsewhere: Secondary | ICD-10-CM | POA: Diagnosis present

## 2013-05-04 DIAGNOSIS — Z923 Personal history of irradiation: Secondary | ICD-10-CM

## 2013-05-04 DIAGNOSIS — M109 Gout, unspecified: Secondary | ICD-10-CM | POA: Diagnosis present

## 2013-05-04 DIAGNOSIS — D696 Thrombocytopenia, unspecified: Secondary | ICD-10-CM | POA: Diagnosis present

## 2013-05-04 DIAGNOSIS — M129 Arthropathy, unspecified: Secondary | ICD-10-CM | POA: Diagnosis present

## 2013-05-04 DIAGNOSIS — E7800 Pure hypercholesterolemia, unspecified: Secondary | ICD-10-CM | POA: Diagnosis present

## 2013-05-04 DIAGNOSIS — I252 Old myocardial infarction: Secondary | ICD-10-CM

## 2013-05-04 DIAGNOSIS — G8929 Other chronic pain: Secondary | ICD-10-CM | POA: Diagnosis present

## 2013-05-04 DIAGNOSIS — Z951 Presence of aortocoronary bypass graft: Secondary | ICD-10-CM

## 2013-05-04 DIAGNOSIS — Z79899 Other long term (current) drug therapy: Secondary | ICD-10-CM

## 2013-05-04 DIAGNOSIS — Z86718 Personal history of other venous thrombosis and embolism: Secondary | ICD-10-CM

## 2013-05-04 DIAGNOSIS — I2510 Atherosclerotic heart disease of native coronary artery without angina pectoris: Secondary | ICD-10-CM | POA: Diagnosis present

## 2013-05-04 DIAGNOSIS — Z87891 Personal history of nicotine dependence: Secondary | ICD-10-CM

## 2013-05-04 DIAGNOSIS — D649 Anemia, unspecified: Secondary | ICD-10-CM | POA: Diagnosis present

## 2013-05-04 DIAGNOSIS — Z8546 Personal history of malignant neoplasm of prostate: Secondary | ICD-10-CM

## 2013-05-04 DIAGNOSIS — N400 Enlarged prostate without lower urinary tract symptoms: Secondary | ICD-10-CM | POA: Diagnosis present

## 2013-05-04 DIAGNOSIS — E785 Hyperlipidemia, unspecified: Secondary | ICD-10-CM | POA: Diagnosis present

## 2013-05-04 HISTORY — DX: Personal history of other medical treatment: Z92.89

## 2013-05-04 HISTORY — DX: Type 2 diabetes mellitus without complications: E11.9

## 2013-05-04 HISTORY — DX: Benign prostatic hyperplasia without lower urinary tract symptoms: N40.0

## 2013-05-04 HISTORY — DX: Unspecified malignant neoplasm of skin of unspecified part of face: C44.300

## 2013-05-04 HISTORY — DX: Low back pain: M54.5

## 2013-05-04 HISTORY — DX: Acute embolism and thrombosis of unspecified deep veins of unspecified lower extremity: I82.409

## 2013-05-04 HISTORY — DX: Other chronic pain: G89.29

## 2013-05-04 HISTORY — DX: Pure hypercholesterolemia, unspecified: E78.00

## 2013-05-04 HISTORY — DX: Low back pain, unspecified: M54.50

## 2013-05-04 HISTORY — DX: Gout, unspecified: M10.9

## 2013-05-04 HISTORY — DX: Malignant neoplasm of prostate: C61

## 2013-05-04 LAB — COMPREHENSIVE METABOLIC PANEL
ALT: 17 U/L (ref 0–53)
BUN: 22 mg/dL (ref 6–23)
CO2: 28 mEq/L (ref 19–32)
Calcium: 7.9 mg/dL — ABNORMAL LOW (ref 8.4–10.5)
Creatinine, Ser: 0.61 mg/dL (ref 0.50–1.35)
GFR calc Af Amer: >90 mL/min (ref >90)
GFR calc non Af Amer: >90 mL/min (ref >90)
Glucose, Bld: 105 mg/dL — ABNORMAL HIGH (ref 70–99)
Total Protein: 5.5 g/dL — ABNORMAL LOW (ref 6.0–8.3)

## 2013-05-04 LAB — CBC WITH DIFFERENTIAL/PLATELET
Basophils Relative: 0 % (ref 0–1)
Eosinophils Absolute: 0.2 10*3/uL (ref 0.0–0.7)
HCT: 27.7 % — ABNORMAL LOW (ref 39.0–52.0)
Hemoglobin: 9 g/dL — ABNORMAL LOW (ref 13.0–17.0)
Lymphocytes Relative: 12 % (ref 12–46)
MCHC: 32.5 g/dL (ref 30.0–36.0)
Monocytes Relative: 7 % (ref 3–12)
Neutrophils Relative %: 79 % — ABNORMAL HIGH (ref 43–77)
RBC: 3.01 MIL/uL — ABNORMAL LOW (ref 4.22–5.81)
WBC: 8.5 10*3/uL (ref 4.0–10.5)

## 2013-05-04 LAB — PRO B NATRIURETIC PEPTIDE: Pro B Natriuretic peptide (BNP): 1031 pg/mL — ABNORMAL HIGH (ref 0–450)

## 2013-05-04 LAB — PROTIME-INR
INR: 1.75 — ABNORMAL HIGH (ref 0.00–1.49)
Prothrombin Time: 19.9 seconds — ABNORMAL HIGH (ref 11.6–15.2)

## 2013-05-04 MED ORDER — DOCUSATE SODIUM 100 MG PO CAPS
100.0000 mg | ORAL_CAPSULE | Freq: Two times a day (BID) | ORAL | Status: DC
  Administered 2013-05-04 – 2013-05-05 (×2): 100 mg via ORAL
  Filled 2013-05-04 (×4): qty 1

## 2013-05-04 MED ORDER — BISACODYL 10 MG RE SUPP: 10.0000 mg | Freq: Every day | RECTAL | Status: DC | PRN

## 2013-05-04 MED ORDER — SENNOSIDES-DOCUSATE SODIUM 8.6-50 MG PO TABS
1.0000 | ORAL_TABLET | Freq: Every evening | ORAL | Status: DC | PRN
  Filled 2013-05-04: qty 1

## 2013-05-04 MED ORDER — ALBUTEROL SULFATE (5 MG/ML) 0.5% IN NEBU
2.5000 mg | INHALATION_SOLUTION | Freq: Four times a day (QID) | RESPIRATORY_TRACT | Status: DC
  Administered 2013-05-04 – 2013-05-06 (×6): 2.5 mg via RESPIRATORY_TRACT
  Filled 2013-05-04 (×5): qty 0.5

## 2013-05-04 MED ORDER — ACETAMINOPHEN 325 MG PO TABS: 650.0000 mg | ORAL_TABLET | Freq: Four times a day (QID) | ORAL | Status: DC | PRN

## 2013-05-04 MED ORDER — OXYCODONE HCL 5 MG PO TABS
5.0000 mg | ORAL_TABLET | ORAL | Status: DC | PRN
  Administered 2013-05-04: 5 mg via ORAL
  Filled 2013-05-04: qty 1

## 2013-05-04 MED ORDER — DEXTROSE 5 % IV SOLN
2.0000 g | INTRAVENOUS | Status: DC
  Administered 2013-05-04 – 2013-05-05 (×2): 2 g via INTRAVENOUS
  Filled 2013-05-04 (×3): qty 2

## 2013-05-04 MED ORDER — TAMSULOSIN HCL 0.4 MG PO CAPS
0.4000 mg | ORAL_CAPSULE | Freq: Every day | ORAL | Status: DC
  Administered 2013-05-04 – 2013-05-06 (×3): 0.4 mg via ORAL
  Filled 2013-05-04 (×3): qty 1

## 2013-05-04 MED ORDER — METHOCARBAMOL 500 MG PO TABS
500.0000 mg | ORAL_TABLET | Freq: Three times a day (TID) | ORAL | Status: DC | PRN
  Filled 2013-05-04: qty 1

## 2013-05-04 MED ORDER — ONDANSETRON HCL 4 MG/2ML IJ SOLN: 4.0000 mg | Freq: Three times a day (TID) | INTRAMUSCULAR | Status: DC | PRN

## 2013-05-04 MED ORDER — ACETAMINOPHEN 650 MG RE SUPP: 650.0000 mg | Freq: Four times a day (QID) | RECTAL | Status: DC | PRN

## 2013-05-04 MED ORDER — ONDANSETRON HCL 4 MG PO TABS: 4.0000 mg | ORAL_TABLET | Freq: Four times a day (QID) | ORAL | Status: DC | PRN

## 2013-05-04 MED ORDER — BRIMONIDINE TARTRATE 0.2 % OP SOLN
1.0000 [drp] | Freq: Two times a day (BID) | OPHTHALMIC | Status: DC
  Administered 2013-05-04 – 2013-05-06 (×4): 1 [drp] via OPHTHALMIC
  Filled 2013-05-04 (×2): qty 5

## 2013-05-04 MED ORDER — TRAMADOL HCL 50 MG PO TABS: 50.0000 mg | ORAL_TABLET | Freq: Four times a day (QID) | ORAL | Status: DC | PRN

## 2013-05-04 MED ORDER — INSULIN ASPART 100 UNIT/ML ~~LOC~~ SOLN
0.0000 [IU] | Freq: Three times a day (TID) | SUBCUTANEOUS | Status: DC
  Administered 2013-05-06: 13:00:00 1 [IU] via SUBCUTANEOUS

## 2013-05-04 MED ORDER — SERTRALINE HCL 50 MG PO TABS
50.0000 mg | ORAL_TABLET | Freq: Every day | ORAL | Status: DC
  Administered 2013-05-05 – 2013-05-06 (×2): 50 mg via ORAL
  Filled 2013-05-04 (×2): qty 1

## 2013-05-04 MED ORDER — PIPERACILLIN-TAZOBACTAM 3.375 G IVPB: 3.3750 g | Freq: Three times a day (TID) | INTRAVENOUS | Status: DC

## 2013-05-04 MED ORDER — IPRATROPIUM BROMIDE 0.02 % IN SOLN
0.5000 mg | Freq: Four times a day (QID) | RESPIRATORY_TRACT | Status: DC
  Administered 2013-05-04 – 2013-05-06 (×6): 0.5 mg via RESPIRATORY_TRACT
  Filled 2013-05-04 (×5): qty 2.5

## 2013-05-04 MED ORDER — SODIUM CHLORIDE 0.9 % IV SOLN
INTRAVENOUS | Status: DC
  Administered 2013-05-04: 20 mL/h via INTRAVENOUS

## 2013-05-04 MED ORDER — BIOTENE DRY MOUTH MT LIQD
15.0000 mL | Freq: Two times a day (BID) | OROMUCOSAL | Status: DC
  Administered 2013-05-04 – 2013-05-06 (×4): 15 mL via OROMUCOSAL

## 2013-05-04 NOTE — ED Notes (Signed)
IV attempted x's 2 without success.  IV team paged st's they will attempt access.

## 2013-05-04 NOTE — ED Notes (Addendum)
MD Shon Baton at bedside removing the stables and applying new wound vac to the area.

## 2013-05-04 NOTE — ED Notes (Signed)
MD Shon Baton at bedside for evaluation.

## 2013-05-04 NOTE — ED Notes (Signed)
Pt had back surgery on 03/16/13 to T9-L3 fusion.  Pt became septic in august, went to OR on 8/27 and had I & D.  Pt has been on wound vac and antibiotics since.  [facility] so wound vac could be changed and surgery to close wound tomorrow.

## 2013-05-04 NOTE — H&P (Signed)
Provider Not In System Chief Complaint: Post-op spinal wound infection History: HPI: Patient is a 63 old gentleman who sustained a burst fracture. He initially underwent internal fixation for stabilization the burst fracture. Patient subtotally had failure of the hardware this was removed and patient underwent to irrigation and debridements for deep infection. Patient is currently being treated with a wound VAC for wound closure nutrition supplementation and IV antibiotics.   Past Medical History  Diagnosis Date  . Hypertension   . Diabetes mellitus without complication   . Chronic kidney disease     bph  . Arthritis   . Coronary artery disease   . Myocardial infarction     No Known Allergies  No current facility-administered medications on file prior to encounter.   Current Outpatient Prescriptions on File Prior to Encounter  Medication Sig Dispense Refill  . acetaminophen (TYLENOL) 325 MG tablet Take 650 mg by mouth every 6 (six) hours as needed for pain.      [name] antiseptic oral rinse (BIOTENE) LIQD 15 mLs by Mouth Rinse route 2 (two) times daily.      . brimonidine (ALPHAGAN) 0.2 % ophthalmic solution Place 1 drop into both eyes 2 (two) times daily.      . calcium-vitamin D (OSCAL WITH D) 250-125 MG-UNIT per tablet Take 2 tablets by mouth daily.       . Coenzyme Q10 (COQ-10) 100 MG CAPS Take 200 mg by mouth daily.      [name] docusate sodium (COLACE) 100 MG capsule Take 1 capsule (100 mg total) by mouth 3 (three) times daily as needed for constipation.  30 capsule  0  . HYDROcodone-acetaminophen (NORCO/VICODIN) 5-325 MG per tablet Take 1 tablet by mouth every 6 (six) hours as needed for pain.  45 tablet  0  . insulin aspart (NOVOLOG) 100 UNIT/ML injection Inject 2-6 Units into the skin every 4 (four) hours.  1 vial  12  . methocarbamol (ROBAXIN) 500 MG tablet Take 1 tablet (500 mg total) by mouth 3 (three) times daily as needed.  60 tablet  0  . morphine 2 MG/ML injection Inject 0.5-2  mLs (1-4 mg total) into the vein every 3 (three) hours as needed.  1 mL  0  . Multiple Vitamins-Minerals (MULTIVITAMIN WITH MINERALS) tablet Take 1 tablet by mouth daily.      . ondansetron (ZOFRAN) 4 MG tablet Take 1 tablet (4 mg total) by mouth every 8 (eight) hours as needed for nausea.  20 tablet  0  . oxyCODONE 10 MG TABS Take 1 tablet (10 mg total) by mouth every 4 (four) hours as needed.  30 tablet  0  . piperacillin-tazobactam (ZOSYN) 3.375 GM/50ML IVPB Inject 50 mLs (3.375 g total) into the vein every 8 (eight) hours.  50 mL    . polyethylene glycol (MIRALAX / GLYCOLAX) packet Take 17 g by mouth daily.  14 each  0  . simvastatin (ZOCOR) 20 MG tablet Take 10 mg by mouth every evening.      . sodium chloride 0.9 % infusion Inject 250 mLs into the vein continuous.    0  . tamsulosin (FLOMAX) 0.4 MG CAPS Take 0.4 mg by mouth daily.        Physical Exam: Filed Vitals:   04/27/13 1314  BP:   Pulse:   Temp: 98.4 F (36.9 C)  Resp:    Patient is alert, able to remember who I am.  Orientated to person and place No SOB/CP Left subclavian line C/D/I  No pain with ROM of UE  NO pain with ROM of LE Compartments soft/NT Pelvis stable Wound: no drainage or erythema  Granulation tissue noted    Image: Ct Head Wo Contrast  04/26/2013   *RADIOLOGY REPORT*  Clinical Data: Increased confusion.  Back surgery on 03/16/2013.  CT HEAD WITHOUT CONTRAST  Technique:  Contiguous axial images were obtained from the base of the skull through the vertex without contrast.  Comparison: None.  Findings: The ventricles are normal in size, the ventricles are normal in configuration.  There is ventricular and sulcal enlargement reflecting mild atrophy.  There is a well-defined focus of hypoattenuation in the right centrum semiovale only consistent with a small old lacunar infarct.  A tiny old lacunar infarct is suggested along the medial left caudate nucleus head.  There are patchy areas of predominantly  periventricular white matter hypoattenuation, which are most consistent with chronic microvascular ischemic change.  There are no parenchymal masses or mass effect.  There is no evidence of a recent transcortical infarct.  There are no extra-axial masses or abnormal fluid collections.  There is no intracranial hemorrhage.  The sinuses and mastoid air cells are clear.  No skull lesion.  IMPRESSION: No acute intracranial abnormalities.  Mild atrophy and chronic microvascular ischemic change.  Small lacunar infarcts in the right centrum semiovale only and left caudate nucleus head.   Original Report Authenticated By: Amie Portland, M.D.   US Abdomen Complete  04/18/2013   *RADIOLOGY REPORT*  Clinical Data:  Jaundice.  ABDOMEN ULTRASOUND  Technique:  Complete abdominal ultrasound examination was performed including evaluation of the liver, gallbladder, bile ducts, pancreas, kidneys, spleen, IVC, and abdominal aorta.  Comparison:  None  Findings:  Gallbladder:  No gallstones identified.  The gallbladder wall is accentuated by surrounding ascites.  Common Bile Duct:  Normal caliber with maximal diameter of 6 mm.  Liver:  Gross and likely advanced cirrhotic changes are identified of the liver which is shrunken in appearance and heterogeneous in echotexture.  No definite focal hepatic masses are identified. Color Doppler flow is detected in the main portal vein.  IVC:  The IVC is very poorly visualized by ultrasound.  Pancreas:  The pancreas is very poorly visualized and essentially nonvisualized by ultrasound.  Spleen:  The spleen appears likely mildly enlarged and homogeneous in echotexture.  Kidneys:  The right kidney measures 10.5 cm and the left kidney 13.1 cm.  There is suggestion of cortical atrophy on the right.  No hydronephrosis or focal renal lesions are identified.  Abdominal Aorta:  Visualized segments of the abdominal aorta are normal caliber.  Ascites is present around the liver and gallbladder.  A small  amount of ascites is seen adjacent to the spleen.  IMPRESSION: Changes by ultrasound consistent with advanced cirrhosis with associated ascites and suggestion of mild splenomegaly.  No definite hepatic masses are identified by ultrasound.   Original Report Authenticated By: Irish Lack, M.D.   Dg Chest Port 1 View  04/27/2013   *RADIOLOGY REPORT*  Clinical Data: Dyspnea  PORTABLE CHEST - 1 VIEW  Comparison: 04/22/2013  Findings: Bibasilar heterogeneous opacities unchanged.  Left subclavian central venous catheter unchanged.  No pneumothorax. Low volumes.  Left pleural effusions suspected.  IMPRESSION: Stable.  Bibasilar airspace disease.  Left effusion.  Low volumes.   Original Report Authenticated By: Jolaine Click, M.D.   Dg Chest Port 1 View  04/22/2013   *RADIOLOGY REPORT*  Clinical Data: Pulmonary edema.  PORTABLE CHEST - 1 VIEW  Comparison: 04/20/2013.  Findings: Interval improved lung aeration with near complete resolution of pulmonary edema.  Small left pleural effusion and persistent streaky bibasilar atelectasis.  The left subclavian central venous catheter is stable.  IMPRESSION:  1.  Improved aeration with near complete resolution of the pulmonary edema. 2.  Small left pleural effusion and bibasilar atelectasis.   Original Report Authenticated By: Rudie Meyer, M.D.   Dg Chest Port 1 View  04/20/2013   *RADIOLOGY REPORT*  Clinical Data: Pneumonia, shortness of breath  PORTABLE CHEST - 1 VIEW  Comparison: Prior radiograph from 04/16/2013  Findings: Sternotomy wires again noted.  Transverse heart size is grossly stable.  Course atherosclerotic calcifications are again noted within the aortic arch.  The lungs remain hypoinflated.  There is diffuse prominence of the interstitial markings with perihilar vascular congestion, consistent with interstitial edema.  Bibasilar airspace opacities are not significantly changed as compared to the prior exam. Veiling opacity overlying both hemidiaphragms are  consistent with pleural effusions.  No acute osseous abnormality.  IMPRESSION:  1.  No significant interval change in extensive bibasilar airspace opacity and bilateral pleural effusions. 2.  Superimposed edema, not significantly changed.   Original Report Authenticated By: Rise Mu, M.D.   Dg Chest Port 1 View  04/16/2013   *RADIOLOGY REPORT*  Clinical Data: Respiratory distress  PORTABLE CHEST - 1 VIEW  Comparison: 04/13/2013  Findings: The left-sided central venous line is again identified and stable.  Cardiac shadow is stable.  Postsurgical changes are seen.  Increasing bibasilar atelectasis with bilateral pleural effusions is noted right greater than left.  No acute bony abnormality is noted.  IMPRESSION: Increasing bibasilar atelectasis with bilateral effusions as described.   Original Report Authenticated By: Alcide Clever, M.D.   Dg Chest Port 1 View  04/13/2013   *RADIOLOGY REPORT*  Clinical Data: Post central line placement.  PORTABLE CHEST - 1 VIEW  Comparison: 04/13/2013 at 0426 hours and CT chest 03/31/2013.  Findings: Trachea is midline.  Heart size stable.  Left subclavian central line tip projects over the SVC.  No definite pneumothorax. Heart size stable.  Lungs are low in volume with slight interval improvement in basilar dependent interstitial prominence and indistinctness. Left lower lobe air space disease persists.  Small left pleural effusion.  IMPRESSION:  1.  Left subclavian central line placement without definite pneumothorax. 2.  Slight improvement in bibasilar dependent edema. 3.  Persistent left lower lobe air space disease. 4.  Small left pleural effusion.   Original Report Authenticated By: Leanna Battles, M.D.   Dg Chest Port 1 View  04/13/2013   *RADIOLOGY REPORT*  Clinical Data: Follow up pulmonary infiltrates  PORTABLE CHEST - 1 VIEW  Comparison: Prior chest x-ray 08/26 1014  Findings: Slightly improved inspiratory volumes and decreasing pulmonary edema.   Persistent layering pleural effusions and associated bibasilar atelectasis versus infiltrate. Prior median sternotomy.  Stable aortic atherosclerosis and cardiomegaly.  No pneumothorax.  IMPRESSION:  1.  Slightly improved inspiratory volumes and decreased pulmonary edema. 2.  Persistent small bilateral layering pleural effusions and associated bibasilar atelectasis versus infiltrate.   Original Report Authenticated By: Malachy Moan, M.D.   Dg Chest Port 1 View  04/12/2013   *RADIOLOGY REPORT*  Clinical Data: Respiratory failure  PORTABLE CHEST - 1 VIEW  Comparison: Prior chest x-ray 04/11/2013  Findings: Persistent very low lung volumes with slightly increased pulmonary vascular congestion and pulmonary edema.  Probable small left greater than right layering pleural effusions.  Cardiomegaly is similar compared to prior.  Atherosclerotic calcifications noted in the transverse and descending thoracic aorta.  The patient is status post median sternotomy.  Linear calcification in the left upper quadrant is likely peri splenic after correlation with prior CT imaging.  No pneumothorax or acute osseous abnormality.  IMPRESSION:  1.  Worsening pulmonary edema. 2.  Small bilateral layering pleural effusions with associated atelectasis versus infiltrate. 3.  Inspiratory volumes remain very low.   Original Report Authenticated By: Malachy Moan, M.D.   Dg Chest Portable 1 View  04/11/2013   *RADIOLOGY REPORT*  Clinical Data: Sepsis  PORTABLE CHEST - 1 VIEW  Comparison: 04/11/2013; 04/04/2013  Findings: Grossly unchanged enlarged cardiac silhouette and mediastinal contours post median sternotomy given persistently reduced lung volumes.  Atherosclerotic calcifications within the thoracic aorta. Interval removal of right upper extremity approach PICC line.  The pulmonary vasculature is less distinct on the present examination.  Worsening bibasilar heterogeneous and consolidative opacities, left greater than right.   Trace bilateral effusions are not excluded.  No pneumothorax.  Unchanged bones.  IMPRESSION: 1.  Decreased lung volumes with findings suggestive of pulmonary edema and worsening bibasilar opacities, left greater than right, atelectasis versus infiltrate. 2.  Interval removal of PICC line.   Original Report Authenticated By: Tacey Ruiz, MD   Dg Chest Portable 1 View  04/11/2013   *RADIOLOGY REPORT*  Clinical Data: Short of breath, lung infection  PORTABLE CHEST - 1 VIEW  Comparison: Prior chest x-ray 04/04/2013  Findings: Right upper extremity PICC.  The catheter tip projects over the superior cavoatrial junction.  Stable cardiac and mediastinal contours.  Continued left retrocardiac opacity and probable small left pleural effusion.  Stable pulmonary vascular congestion without overt edema and linear right basilar atelectasis.  IMPRESSION: No significant interval change in the appearance of the chest compared to 04/04/2013.  Persistent left greater than right basilar atelectasis versus consolidation.  The tip of the right upper extremity PICC projects over the superior cavoatrial junction.   Original Report Authenticated By: Malachy Moan, M.D.    A/P:  Patient s/p T9-L3 instrumented fusion for a burst fracture.  Post-operative course complicated by hardware compromise and infection.  Patient underwent removal of hardware and I+D of the wound.  Patient required second I+D and application of VAC dressing.  Patient has been followed on [facility].  Patient seen today in ER for admission to my service for possible partial wound closure. Patient improving overall.   Malnutrition improving. WBC/CRP improved Wound - granulation tissue noted.  No active purulent drainage.   Plan:  Medical consult for evaluation  Continue Abx  Plan on return to OR tomorrow for wash out and partial wound closure Discussed with patient and his daughter.  Agree with plan.   Ultimately if the wound continues  to improve then will complete closure at different time Will discuss conscious sedation and local anesthesia instead of general anesthesia - with Anesthesia MD

## 2013-05-04 NOTE — ED Notes (Signed)
IV team here to assess triple lumen in left chest.

## 2013-05-04 NOTE — ED Notes (Signed)
Pt returned from xray

## 2013-05-04 NOTE — ED Notes (Signed)
Patient transported to X-ray 

## 2013-05-04 NOTE — ED Notes (Signed)
Asher Muir, RN from 5N in to apply wound vac.

## 2013-05-04 NOTE — ED Notes (Signed)
Pt assisted with dinner meal tray.

## 2013-05-04 NOTE — ED Notes (Signed)
Wound team paged.

## 2013-05-04 NOTE — Progress Notes (Signed)
Patient arrived on unit from ED.  Patient alert to self only. Wound vac attached to patient's back. BP 134/71, HR 97, 96% 2L O2, R 18.  Unable to complete admission hx d/t patient alter mental status.

## 2013-05-04 NOTE — Progress Notes (Signed)
Called ED to obtain report.  RN to return call.

## 2013-05-04 NOTE — Progress Notes (Signed)
Contacted by Zonia Kief, PA to assist with applying wound vac in ED.  Applied wound vac at 2145 with continuous suction at 75 mm/Hg per order. Full suction at lower portion of foam dsg, however the upper foam had no suction. Additional foam ordered and once pt arrived in 5W12, added additional foam at 2300 in center of wound to create suction to the upper portion. Full suction currently to entire portion of foam, no leaks noted.

## 2013-05-04 NOTE — Consult Note (Signed)
[facility] Medical Consultation  Keiron Iodice UJW:[id number] DOB: Apr 28, 1934 DOA: 05/04/2013 PCP: Provider Not In System   Requesting physician: D. Brooks Date of consultation: 05/04/13 Reason for consultation: preop eval and medical management  Impression/Recommendations  Active Problems:   Osteomyelitis of spine: Serratia, on Cefepime 2 gm IV q24 hours at [facility]. Will resume.  For wound closure tomorrow   Anemia, chronic   DM2 (diabetes mellitus, type 2): resume SSI   Hyperlipidemia: on statin   Protein-calorie malnutrition, severe   Benign hypertension:    Thrombocytopenia, chronic  Will check pro BNP and await EKG.  If unchanged from previous, may proced to OR.  Appears medically optimized otherwise.  Will decrease IV to [facility], since (suboptimal) CXR shows pulmonary vascular congestion.  Thanks, Dr. Shon Baton.  Will follow.  Chief Complaint: no complaint  HPI:  Patient is a 84 old gentleman who sustained a burst fracture. He initially underwent internal fixation for stabilization the burst fracture. Patient subsequently had failure of the hardware this was removed and patient underwent to irrigation and debridements for deep infection. Patient is currently being treated with a wound VAC for wound closure nutrition supplementation and IV antibiotics.   Review of Systems:  Difficult, as patient won't answer questions reliably  Past Medical History  Diagnosis Date  . Hypertension   . Diabetes mellitus without complication   . Chronic kidney disease     bph  . Arthritis   . Coronary artery disease   . Myocardial infarction    Past Surgical History  Procedure Laterality Date  . Appendectomy    . Coronary artery bypass graft  11/09/2000    LIMA to LAD, SVG to RPDA, seq SVG to OM2 and D1 by [name] [facility]  . Lumbar fusion  03/15/2013    Dr Shon Baton  . Posterior lumbar fusion 4 level N/A 03/16/2013    Procedure: T9 - L3 POSTERIOR POSTERIOR SPINAL FUSION ;   Surgeon: Venita Lick, MD;  Location: [facility] OR;  Service: Orthopedics;  Laterality: N/A;  . Hardware removal N/A 03/30/2013    Procedure: HARDWARE REMOVAL/IRRIGATION & DEBRIDEMENT OF WOUND ON BACK;  Surgeon: Venita Lick, MD;  Location: [facility] OR;  Service: Orthopedics;  Laterality: N/A;  . Incision and drainage of wound N/A 04/13/2013    Procedure: IRRIGATION AND DEBRIDEMENT WOUND AND VAC DRESSING;  Surgeon: Venita Lick, MD;  Location: [facility] OR;  Service: Orthopedics;  Laterality: N/A;   Social History:  reports that he has quit smoking. He has never used smokeless tobacco. He reports that he does not drink alcohol or use illicit drugs.  No Known Allergies Family History  Problem Relation Age of Onset  . Diabetes Mother   . Stroke Brother     x2    Prior to Admission medications See Med rec   Physical Exam: Blood pressure 130/74, pulse 78, temperature 98.3 F (36.8 C), temperature source Oral, resp. rate 20, SpO2 97.00%. Filed Vitals:   05/04/13 1742  BP: 130/74  Pulse: 78  Temp: 98.3 F (36.8 C)  Resp: 20   BP 130/74  Pulse 78  Temp(Src) 98.3 F (36.8 C) (Oral)  Resp 20  SpO2 97%  General Appearance:    Alert, frail white male eating pureed supper  Head:    Normocephalic, without obvious abnormality, atraumatic  Eyes:    PERRL, conjunctiva/corneas clear, EOM's intact, fundi    benign, both eyes          Nose:   Nares normal, septum midline,  mucosa normal, no drainage   or sinus tenderness  Throat:   Lips, mucosa, and tongue normal; teeth and gums normal  Neck:   Supple, symmetrical, trachea midline, no adenopathy;       thyroid:  No enlargement/tenderness/nodules; no carotid   bruit or JVD     Lungs:     Clear to auscultation bilaterally, respirations unlabored  Chest wall:    No tenderness or deformity  Heart:    Regular rate and rhythm, S1 and S2 normal, no murmur, rub   or gallop  Abdomen:     Soft, non-tender, bowel sounds active all four quadrants,    no masses, no  organomegaly  Genitalia:  deferred  Rectal:   deferred  Extremities:   Extremities normal, atraumatic, no cyanosis or edema  Pulses:   2+ and symmetric all extremities  Skin:   Skin color, texture, turgor normal, no rashes or lesions  Lymph nodes:   Cervical, supraclavicular, and axillary nodes normal  Neurologic:   CNII-XII intact. Normal strength, sensation and reflexes      throughout    Labs on Admission:  Basic Metabolic Panel:  Recent Labs Lab 04/29/13 0420 04/29/13 1625 04/30/13 0550 05/01/13 0550 05/03/13 0500  NA 145 144 143 144 145  K 4.2 4.4 4.1 3.9 4.0  CL 112 111 110 112 112  CO2 29 27 25 26 26   GLUCOSE 103* 143* 105* 103* 109*  BUN 25* 26* 24* 26* 23  CREATININE 0.65 0.67 0.67 0.69 0.59  CALCIUM 8.6 8.3* 8.4 8.2* 8.4  MG  --   --   --   --  2.1  PHOS  --   --   --   --  2.8   Liver Function Tests:  Recent Labs Lab 05/01/13 0550  AST 17  ALT 15  ALKPHOS 66  BILITOT 1.6*  PROT 4.9*  ALBUMIN 1.6*   No results found for this basename: LIPASE, AMYLASE,  in the last 168 hours No results found for this basename: AMMONIA,  in the last 168 hours CBC:  Recent Labs Lab 04/28/13 0500 04/29/13 0420 04/29/13 1625 04/30/13 0550 05/01/13 0550 05/03/13 0500  WBC 5.4 6.8 7.0 6.9 7.1 7.0  NEUTROABS 4.1  --   --   --   --   --   HGB 7.5* 8.2* 8.1* 7.7* 8.7* 9.2*  HCT 24.3* 25.7* 25.4* 24.8* 27.6* 28.8*  MCV 96.4 95.9 96.2 96.9 94.2 93.8  PLT 82* 103* 103* 100* 119* 126*   Cardiac Enzymes: No results found for this basename: CKTOTAL, CKMB, CKMBINDEX, TROPONINI,  in the last 168 hours BNP: No components found with this basename: POCBNP,  CBG: No results found for this basename: GLUCAP,  in the last 168 hours  Radiological Exams on Admission: X-ray Chest Pa And Lateral   05/04/2013   *RADIOLOGY REPORT*  Clinical Data: Preop radiograph  CHEST - 2 VIEW  Comparison: 04/27/2013  Findings: There is a left subclavian catheter with tip in the projection of the  SVC.  The patient is status post median sternotomy CABG procedure.  The lung volumes are low.  There are bilateral pleural effusions right greater than left. These appear increased from 04/27/2013.  Atelectasis is noted within both lung bases.  There is mild pulmonary vascular congestion identified. Large hiatal hernia is again identified peri  IMPRESSION:  1. Increased and bilateral pleural effusions and bibasilar atelectasis.   Original Report Authenticated By: Signa Kell, M.D.    EKG: PENDING  Time spent: 70 minutes  Jhovanny Guinta L [facility] Pager [phone]  If 7PM-7AM, please contact night-coverage [email or web address] Password [facility] 05/04/2013, 8:44 PM

## 2013-05-05 ENCOUNTER — Inpatient Hospital Stay (HOSPITAL_COMMUNITY): Payer: Medicare Other | Admitting: Certified Registered Nurse Anesthetist

## 2013-05-05 ENCOUNTER — Encounter (HOSPITAL_COMMUNITY)
Admission: EM | Disposition: A | Discharge status: Nursing Home (Includes ICF) | Payer: Self-pay | Source: Home / Self Care | Attending: Orthopedic Surgery

## 2013-05-05 ENCOUNTER — Encounter (HOSPITAL_COMMUNITY): Payer: Self-pay | Admitting: General Practice

## 2013-05-05 ENCOUNTER — Encounter (HOSPITAL_COMMUNITY): Payer: Self-pay | Admitting: Certified Registered Nurse Anesthetist

## 2013-05-05 DIAGNOSIS — I10 Essential (primary) hypertension: Secondary | ICD-10-CM

## 2013-05-05 HISTORY — PX: SECONDARY CLOSURE OF WOUND: SHX6208

## 2013-05-05 LAB — CBC
HCT: 25.1 % — ABNORMAL LOW (ref 39.0–52.0)
Platelets: 113 10*3/uL — ABNORMAL LOW (ref 150–400)
RBC: 2.73 MIL/uL — ABNORMAL LOW (ref 4.22–5.81)
RDW: 21.8 % — ABNORMAL HIGH (ref 11.5–15.5)
WBC: 5.3 10*3/uL (ref 4.0–10.5)

## 2013-05-05 LAB — GLUCOSE, CAPILLARY
Glucose-Capillary: 102 mg/dL — ABNORMAL HIGH (ref 70–99)
Glucose-Capillary: 104 mg/dL — ABNORMAL HIGH (ref 70–99)
Glucose-Capillary: 104 mg/dL — ABNORMAL HIGH (ref 70–99)
Glucose-Capillary: 113 mg/dL — ABNORMAL HIGH (ref 70–99)

## 2013-05-05 LAB — BASIC METABOLIC PANEL
Calcium: 8 mg/dL — ABNORMAL LOW (ref 8.4–10.5)
Chloride: 107 mEq/L (ref 96–112)
Creatinine, Ser: 0.58 mg/dL (ref 0.50–1.35)
GFR calc Af Amer: >90 mL/min (ref >90)
Sodium: 139 mEq/L (ref 135–145)

## 2013-05-05 LAB — SURGICAL PCR SCREEN: MRSA, PCR: POSITIVE — AB

## 2013-05-05 SURGERY — SECONDARY CLOSURE OF WOUND
Anesthesia: General

## 2013-05-05 MED ORDER — DOCUSATE SODIUM 100 MG PO CAPS
100.0000 mg | ORAL_CAPSULE | Freq: Two times a day (BID) | ORAL | Status: DC
  Administered 2013-05-05 – 2013-05-06 (×2): 100 mg via ORAL
  Filled 2013-05-05 (×2): qty 1

## 2013-05-05 MED ORDER — DRONABINOL 2.5 MG PO CAPS
2.5000 mg | ORAL_CAPSULE | Freq: Three times a day (TID) | ORAL | Status: DC
  Administered 2013-05-06 (×2): 2.5 mg via ORAL
  Filled 2013-05-05 (×2): qty 1

## 2013-05-05 MED ORDER — GLUCERNA PO LIQD: 237.0000 mL | Freq: Every day | ORAL | Status: DC

## 2013-05-05 MED ORDER — METHOCARBAMOL 100 MG/ML IJ SOLN
500.0000 mg | Freq: Four times a day (QID) | INTRAVENOUS | Status: DC | PRN
  Filled 2013-05-05: qty 5

## 2013-05-05 MED ORDER — ONDANSETRON HCL 4 MG/2ML IJ SOLN: 4.0000 mg | INTRAMUSCULAR | Status: DC | PRN

## 2013-05-05 MED ORDER — FUROSEMIDE 10 MG/ML IJ SOLN
40.0000 mg | Freq: Once | INTRAMUSCULAR | Status: AC
  Administered 2013-05-05: 40 mg via INTRAVENOUS
  Filled 2013-05-05: qty 4

## 2013-05-05 MED ORDER — CALCIUM CARBONATE-VITAMIN D 600-200 MG-UNIT PO TABS: 1.0000 | ORAL_TABLET | Freq: Every day | ORAL | Status: DC

## 2013-05-05 MED ORDER — POTASSIUM CHLORIDE 20 MEQ PO PACK
20.0000 meq | PACK | Freq: Two times a day (BID) | ORAL | Status: DC
  Filled 2013-05-05 (×3): qty 1

## 2013-05-05 MED ORDER — CALCIUM CARBONATE-VITAMIN D 500-200 MG-UNIT PO TABS
1.0000 | ORAL_TABLET | Freq: Every day | ORAL | Status: DC
  Administered 2013-05-06: 1 via ORAL
  Filled 2013-05-05 (×2): qty 1

## 2013-05-05 MED ORDER — SODIUM CHLORIDE 0.9 % IV SOLN
10.0000 mg | INTRAVENOUS | Status: DC | PRN
  Administered 2013-05-05: 100 ug/min via INTRAVENOUS

## 2013-05-05 MED ORDER — ZINC SULFATE 220 (50 ZN) MG PO CAPS
220.0000 mg | ORAL_CAPSULE | Freq: Every day | ORAL | Status: DC
  Administered 2013-05-06: 220 mg via ORAL
  Filled 2013-05-05: qty 1

## 2013-05-05 MED ORDER — ETOMIDATE 2 MG/ML IV SOLN
INTRAVENOUS | Status: DC | PRN
  Administered 2013-05-05: 10 mg via INTRAVENOUS

## 2013-05-05 MED ORDER — LISINOPRIL 5 MG PO TABS
5.0000 mg | ORAL_TABLET | Freq: Every day | ORAL | Status: DC
Start: 2013-05-06 — End: 2013-05-06
  Administered 2013-05-06: 11:00:00 5 mg via ORAL
  Filled 2013-05-05: qty 1

## 2013-05-05 MED ORDER — GLUCERNA SHAKE PO LIQD
237.0000 mL | Freq: Every day | ORAL | Status: DC
  Administered 2013-05-06: 11:00:00 237 mL via ORAL

## 2013-05-05 MED ORDER — GUAIFENESIN 100 MG/5ML PO SYRP
200.0000 mg | ORAL_SOLUTION | Freq: Three times a day (TID) | ORAL | Status: DC | PRN
  Filled 2013-05-05: qty 10

## 2013-05-05 MED ORDER — FLORANEX PO PACK
1.0000 g | PACK | Freq: Every day | ORAL | Status: DC
  Administered 2013-05-06: 11:00:00 1 g via ORAL
  Filled 2013-05-05: qty 1

## 2013-05-05 MED ORDER — PROPOFOL 10 MG/ML IV BOLUS
INTRAVENOUS | Status: DC | PRN
  Administered 2013-05-05: 30 mg via INTRAVENOUS

## 2013-05-05 MED ORDER — PHENYLEPHRINE HCL 10 MG/ML IJ SOLN
INTRAMUSCULAR | Status: DC | PRN
  Administered 2013-05-05: 120 ug via INTRAVENOUS
  Administered 2013-05-05: 80 ug via INTRAVENOUS

## 2013-05-05 MED ORDER — PRO-STAT SUGAR FREE PO LIQD
30.0000 mL | Freq: Two times a day (BID) | ORAL | Status: DC
  Administered 2013-05-06: 11:00:00 30 mL via ORAL
  Filled 2013-05-05 (×2): qty 30

## 2013-05-05 MED ORDER — CALCIUM CHLORIDE 10 % IV SOLN
INTRAVENOUS | Status: DC | PRN
  Administered 2013-05-05 (×5): .2 g via INTRAVENOUS

## 2013-05-05 MED ORDER — CHLORHEXIDINE GLUCONATE CLOTH 2 % EX PADS
6.0000 | MEDICATED_PAD | Freq: Every day | CUTANEOUS | Status: DC
  Administered 2013-05-05 – 2013-05-06 (×2): 6 via TOPICAL

## 2013-05-05 MED ORDER — LACTATED RINGERS IV SOLN
INTRAVENOUS | Status: DC | PRN
  Administered 2013-05-05 (×2): via INTRAVENOUS

## 2013-05-05 MED ORDER — ZOLPIDEM TARTRATE 5 MG PO TABS: 5.0000 mg | ORAL_TABLET | Freq: Every evening | ORAL | Status: DC | PRN

## 2013-05-05 MED ORDER — SUCCINYLCHOLINE CHLORIDE 20 MG/ML IJ SOLN
INTRAMUSCULAR | Status: DC | PRN
  Administered 2013-05-05: 60 mg via INTRAVENOUS

## 2013-05-05 MED ORDER — PHENOL 1.4 % MT LIQD: 1.0000 | OROMUCOSAL | Status: DC | PRN

## 2013-05-05 MED ORDER — BUPIVACAINE-EPINEPHRINE 0.25% -1:200000 IJ SOLN
INTRAMUSCULAR | Status: DC | PRN
  Administered 2013-05-05: 16:00:00 10 mL

## 2013-05-05 MED ORDER — ONDANSETRON HCL 4 MG/2ML IJ SOLN
INTRAMUSCULAR | Status: DC | PRN
  Administered 2013-05-05: 4 mg via INTRAVENOUS

## 2013-05-05 MED ORDER — MUPIROCIN 2 % EX OINT
1.0000 "application " | TOPICAL_OINTMENT | Freq: Two times a day (BID) | CUTANEOUS | Status: DC
  Administered 2013-05-05 – 2013-05-06 (×3): 1 via NASAL
  Filled 2013-05-05: qty 22

## 2013-05-05 MED ORDER — FUROSEMIDE 20 MG PO TABS
20.0000 mg | ORAL_TABLET | Freq: Two times a day (BID) | ORAL | Status: DC
  Filled 2013-05-05: qty 1

## 2013-05-05 MED ORDER — FENTANYL CITRATE 0.05 MG/ML IJ SOLN
INTRAMUSCULAR | Status: DC | PRN
  Administered 2013-05-05: 50 ug via INTRAVENOUS

## 2013-05-05 MED ORDER — FAMOTIDINE 20 MG PO TABS
20.0000 mg | ORAL_TABLET | Freq: Every day | ORAL | Status: DC
  Administered 2013-05-06: 20 mg via ORAL
  Filled 2013-05-05: qty 1

## 2013-05-05 MED ORDER — LIDOCAINE HCL (CARDIAC) 20 MG/ML IV SOLN
INTRAVENOUS | Status: DC | PRN
  Administered 2013-05-05: 100 mg via INTRAVENOUS

## 2013-05-05 MED ORDER — MUPIROCIN 2 % EX OINT: 1.0000 "application " | TOPICAL_OINTMENT | Freq: Two times a day (BID) | CUTANEOUS | Status: DC

## 2013-05-05 MED ORDER — HYDROMORPHONE HCL PF 1 MG/ML IJ SOLN: 0.2500 mg | INTRAMUSCULAR | Status: DC | PRN

## 2013-05-05 MED ORDER — SODIUM CHLORIDE 0.9 % IJ SOLN: 3.0000 mL | Freq: Two times a day (BID) | INTRAMUSCULAR | Status: DC

## 2013-05-05 MED ORDER — SODIUM CHLORIDE 0.9 % IJ SOLN: 3.0000 mL | INTRAMUSCULAR | Status: DC | PRN

## 2013-05-05 MED ORDER — VITAMIN C 500 MG PO TABS
2000.0000 mg | ORAL_TABLET | Freq: Every day | ORAL | Status: DC
  Administered 2013-05-06: 11:00:00 2000 mg via ORAL
  Filled 2013-05-05: qty 4

## 2013-05-05 MED ORDER — SODIUM CHLORIDE 0.9 % IV SOLN: 250.0000 mL | INTRAVENOUS | Status: DC

## 2013-05-05 MED ORDER — VITAMIN D3 25 MCG (1000 UNIT) PO TABS
1000.0000 [IU] | ORAL_TABLET | Freq: Every day | ORAL | Status: DC
  Administered 2013-05-06: 1000 [IU] via ORAL
  Filled 2013-05-05: qty 1

## 2013-05-05 MED ORDER — ONDANSETRON HCL 4 MG/2ML IJ SOLN: 4.0000 mg | Freq: Once | INTRAMUSCULAR | Status: DC | PRN

## 2013-05-05 MED ORDER — ATORVASTATIN CALCIUM 10 MG PO TABS
10.0000 mg | ORAL_TABLET | Freq: Every day | ORAL | Status: DC
  Filled 2013-05-05: qty 1

## 2013-05-05 MED ORDER — GUAIFENESIN 100 MG/5ML PO LIQD: 200.0000 mg | Freq: Three times a day (TID) | ORAL | Status: DC | PRN

## 2013-05-05 MED ORDER — LACTATED RINGERS IV SOLN
INTRAVENOUS | Status: DC
  Administered 2013-05-05 – 2013-05-06 (×2): via INTRAVENOUS

## 2013-05-05 MED ORDER — MENTHOL 3 MG MT LOZG: 1.0000 | LOZENGE | OROMUCOSAL | Status: DC | PRN

## 2013-05-05 MED ORDER — METHOCARBAMOL 500 MG PO TABS
500.0000 mg | ORAL_TABLET | Freq: Four times a day (QID) | ORAL | Status: DC | PRN
  Filled 2013-05-05: qty 1

## 2013-05-05 SURGICAL SUPPLY — 41 items
CANISTER SUCTION 2500CC (MISCELLANEOUS) ×2 IMPLANT
CLOTH BEACON ORANGE TIMEOUT ST (SAFETY) ×2 IMPLANT
COVER SURGICAL LIGHT HANDLE (MISCELLANEOUS) ×2 IMPLANT
DRAPE POUCH INSTRU U-SHP 10X18 (DRAPES) ×2 IMPLANT
DRAPE SURG 17X23 STRL (DRAPES) ×2 IMPLANT
DRAPE U-SHAPE 47X51 STRL (DRAPES) ×2 IMPLANT
DRSG AQUACEL AG ADV 3.5X10 (GAUZE/BANDAGES/DRESSINGS) ×2 IMPLANT
DRSG MEPITEL 4X7.2 (GAUZE/BANDAGES/DRESSINGS) ×4 IMPLANT
DRSG VAC ATS MED SENSATRAC (GAUZE/BANDAGES/DRESSINGS) ×2 IMPLANT
DRSG VAC ATS SM SENSATRAC (GAUZE/BANDAGES/DRESSINGS) ×2 IMPLANT
ELECT BLADE 4.0 EZ CLEAN MEGAD (MISCELLANEOUS)
ELECT CAUTERY BLADE 6.4 (BLADE) ×2 IMPLANT
ELECT REM PT RETURN 9FT ADLT (ELECTROSURGICAL) ×2
ELECTRODE BLDE 4.0 EZ CLN MEGD (MISCELLANEOUS) IMPLANT
ELECTRODE REM PT RTRN 9FT ADLT (ELECTROSURGICAL) ×1 IMPLANT
GLOVE BIOGEL PI IND STRL 8.5 (GLOVE) ×1 IMPLANT
GLOVE BIOGEL PI INDICATOR 8.5 (GLOVE) ×1
GLOVE ECLIPSE 8.5 STRL (GLOVE) ×2 IMPLANT
GLOVE ORTHO TXT STRL SZ7.5 (GLOVE) ×2 IMPLANT
GOWN STRL NON-REIN LRG LVL3 (GOWN DISPOSABLE) ×2 IMPLANT
GOWN STRL REIN 2XL XLG LVL4 (GOWN DISPOSABLE) ×2 IMPLANT
KIT BASIN OR (CUSTOM PROCEDURE TRAY) ×2 IMPLANT
KIT DRAINAGE VACCUM ASSIST (KITS) ×2 IMPLANT
KIT ROOM TURNOVER OR (KITS) ×2 IMPLANT
NEEDLE 18GX1X1/2 (RX/OR ONLY) (NEEDLE) ×2 IMPLANT
NEEDLE 22X1 1/2 (OR ONLY) (NEEDLE) ×2 IMPLANT
NS IRRIG 1000ML POUR BTL (IV SOLUTION) ×2 IMPLANT
PACK LAMINECTOMY ORTHO (CUSTOM PROCEDURE TRAY) ×2 IMPLANT
PAD ARMBOARD 7.5X6 YLW CONV (MISCELLANEOUS) ×4 IMPLANT
PATTIES SURGICAL .5 X.5 (GAUZE/BANDAGES/DRESSINGS) ×2 IMPLANT
STAPLER VISISTAT (STAPLE) ×2 IMPLANT
SURGIFLO TRUKIT (HEMOSTASIS) IMPLANT
SUT PDS AB 1 CT  36 (SUTURE) ×2
SUT PDS AB 1 CT 36 (SUTURE) ×2 IMPLANT
SUT PDS AB 1 CTX 36 (SUTURE) ×2 IMPLANT
SYR BULB IRRIGATION 50ML (SYRINGE) ×4 IMPLANT
SYR CONTROL 10ML LL (SYRINGE) ×2 IMPLANT
TOWEL OR 17X24 6PK STRL BLUE (TOWEL DISPOSABLE) ×2 IMPLANT
TOWEL OR 17X26 10 PK STRL BLUE (TOWEL DISPOSABLE) ×2 IMPLANT
WATER STERILE IRR 1000ML POUR (IV SOLUTION) ×2 IMPLANT
YANKAUER SUCT BULB TIP NO VENT (SUCTIONS) ×2 IMPLANT

## 2013-05-05 NOTE — Brief Op Note (Signed)
05/04/2013 - 05/05/2013  5:19 PM  PATIENT:  [name] [name]  79 y.o. male  PRE-OPERATIVE DIAGNOSIS:  Spine wound infection  POST-OPERATIVE DIAGNOSIS:  Spine wound infection  PROCEDURE:  Procedure(s): WASH-OUT CLOSURE OF WOUND PARTIAL (N/A)  SURGEON:  Surgeon(s) and Role:    * Venita Lick, MD - Primary  PHYSICIAN ASSISTANT:   ASSISTANTS: none   ANESTHESIA:   general  EBL:  Total I/O In: 168.3 [I.V.:168.3] Out: 700 [Urine:700]  BLOOD ADMINISTERED:none  DRAINS: none   LOCAL MEDICATIONS USED:  MARCAINE     SPECIMEN:  No Specimen  DISPOSITION OF SPECIMEN:  N/A  COUNTS:  YES  TOURNIQUET:  * No tourniquets in log *  DICTATION: .Other Dictation: Dictation Number [id number]  PLAN OF CARE: Admit to inpatient   PATIENT DISPOSITION:  PACU - hemodynamically stable.

## 2013-05-05 NOTE — Progress Notes (Signed)
[facility] PROGRESS NOTE  [name] [name] ZOX:[id number] DOB: [birth year] DOA: 05/04/2013 PCP: Provider Not In System  Assessment/Plan:    Osteomyelitis of spine, serratia:  EKG shows nothing acute. Pro BNP of thousand, slightly higher than previous, but patient denies dyspnea and has relatively clear breath sounds. Will give a dose of IV Lasix this morning and resume outpatient dose tomorrow. Echocardiogram in July shows normal EF   Anemia, chronic, stable.  Recently, Ferritin 163, B12 and RBC folate ok.   DM2 (diabetes mellitus, type 2): Controlled without insulin.   Hyperlipidemia on statin   Protein-calorie malnutrition, severe: Had been on protein supplementation and health shakes, as well as Marinol. Will resume tomorrow   Benign hypertension: On lisinopril   Thrombocytopenia, chronic  I have her consult patient's medications. We will continue to follow with you.  Patient is medically optimized for partial wound closure in the OR today.  Antibiotics:  cefepime  HPI/Subjective: Denies dyspnea. No pain.  No complaints  Objective: Filed Vitals:   05/05/13 0539  BP: 150/75  Pulse: 87  Temp: 97.4 F (36.3 C)  Resp: 20    Intake/Output Summary (Last 24 hours) at 05/05/13 0840 Last data filed at 05/05/13 0725  Gross per 24 hour  Intake 188.33 ml  Output    700 ml  Net -511.67 ml   Filed Weights   05/04/13 2214  Weight: 84.188 kg (185 lb 9.6 oz)    Exam:   General:  Asleep. Arousable. Comfortable. Breathing nonlabored  Cardiovascular: Regular rate rhythm without murmurs gallops rubs  Respiratory: Clear to auscultation bilaterally without wheeze rhonchi or rales  Abdomen: Bowel sounds present. Soft, nontender  Ext:  No clubbing cyanosis or edema. TED hose and sequential compression devices in place.  Data Reviewed: Basic Metabolic Panel:  Recent Labs Lab 04/30/13 0550 05/01/13 0550 05/03/13 0500 05/04/13 1821 05/05/13 0500  NA 143 144 145 141  139  K 4.1 3.9 4.0 3.9 3.4*  CL 110 112 112 108 107  CO2 25 26 26 28 26   GLUCOSE 105* 103* 109* 105* 91  BUN 24* 26* 23 22 22   CREATININE 0.67 0.69 0.59 0.61 0.58  CALCIUM 8.4 8.2* 8.4 7.9* 8.0*  MG  --   --  2.1  --   --   PHOS  --   --  2.8  --   --    Liver Function Tests:  Recent Labs Lab 05/01/13 0550 05/04/13 1821  AST 17 18  ALT 15 17  ALKPHOS 66 71  BILITOT 1.6* 1.2  PROT 4.9* 5.5*  ALBUMIN 1.6* 1.7*   No results found for this basename: LIPASE, AMYLASE,  in the last 168 hours No results found for this basename: AMMONIA,  in the last 168 hours CBC:  Recent Labs Lab 04/30/13 0550 05/01/13 0550 05/03/13 0500 05/04/13 1821 05/05/13 0500  WBC 6.9 7.1 7.0 8.5 5.3  NEUTROABS  --   --   --  6.7  --   HGB 7.7* 8.7* 9.2* 9.0* 8.1*  HCT 24.8* 27.6* 28.8* 27.7* 25.1*  MCV 96.9 94.2 93.8 92.0 91.9  PLT 100* 119* 126* 124* 113*   Cardiac Enzymes: No results found for this basename: CKTOTAL, CKMB, CKMBINDEX, TROPONINI,  in the last 168 hours BNP (last 3 results)  Recent Labs  04/16/13 0600 04/20/13 0500 05/04/13 2046  PROBNP 872.7* 569.7* 1031.0*   CBG:  Recent Labs Lab 05/05/13 0053 05/05/13 0359 05/05/13 0742  GLUCAP 102* 97 89    Recent Results (  from the past 240 hour(s))  URINE CULTURE     Status: None   Collection Time    04/25/13 11:00 AM      Result Value Range Status   Specimen Description URINE, RANDOM   Final   Special Requests Normal   Final   Culture  Setup Time     Final   Value: 04/26/2013 03:03     Performed at [facility] Count     Final   Value: NO GROWTH     Performed at [facility]   Culture     Final   Value: NO GROWTH     Performed at [facility]   Report Status 04/27/2013 FINAL   Final  SURGICAL PCR SCREEN     Status: Abnormal   Collection Time    05/05/13 12:09 AM      Result Value Range Status   MRSA, PCR POSITIVE (*) NEGATIVE Final   Comment: RESULT CALLED TO, READ BACK BY AND  VERIFIED WITH:     REBECCA SPARKS RN [phone] 0300 GREEN R   Staphylococcus aureus POSITIVE (*) NEGATIVE Final   Comment:            The Xpert SA Assay (FDA     approved for NASAL specimens     in patients over 38 years of age),     is one component of     a comprehensive surveillance     program.  Test performance has     been validated by [employer] for patients greater     than or equal to 70 year old.     It is not intended     to diagnose infection nor to     guide or monitor treatment.     RESULT CALLED TO, READ BACK BY AND VERIFIED WITH:     REBECCA SPARKS RN [phone] GREEN R     Studies: X-ray Chest Pa And Lateral   05/04/2013   *RADIOLOGY REPORT*  Clinical Data: Preop radiograph  CHEST - 2 VIEW  Comparison: 04/27/2013  Findings: There is a left subclavian catheter with tip in the projection of the SVC.  The patient is status post median sternotomy CABG procedure.  The lung volumes are low.  There are bilateral pleural effusions right greater than left. These appear increased from 04/27/2013.  Atelectasis is noted within both lung bases.  There is mild pulmonary vascular congestion identified. Large hiatal hernia is again identified peri  IMPRESSION:  1. Increased and bilateral pleural effusions and bibasilar atelectasis.   Original Report Authenticated By: Signa Kell, M.D.    Scheduled Meds: . albuterol  2.5 mg Nebulization QID  . antiseptic oral rinse  15 mL Mouth Rinse BID  . [START ON 05/06/2013] atorvastatin  10 mg Oral q1800  . brimonidine  1 drop Both Eyes BID  . [START ON 05/06/2013] calcium-vitamin D  1 tablet Oral Q breakfast  . ceFEPime (MAXIPIME) IV  2 g Intravenous Q24H  . Chlorhexidine Gluconate Cloth  6 each Topical Q0600  . [START ON 05/06/2013] cholecalciferol  1,000 Units Oral Daily  . docusate sodium  100 mg Oral BID  . [START ON 05/06/2013] dronabinol  2.5 mg Oral TID  . [START ON 05/06/2013] famotidine  20 mg Oral Daily  . [START ON 05/06/2013]  feeding supplement  30 mL Oral BID  . [START ON 05/06/2013] furosemide  20 mg Oral BID  . [  START ON 05/06/2013] GLUCERNA  237 mL Oral Daily  . insulin aspart  0-9 Units Subcutaneous TID WC  . ipratropium  0.5 mg Nebulization QID  . [START ON 05/06/2013] lactobacillus  1 g Oral Daily  . [START ON 05/06/2013] lisinopril  5 mg Oral Daily  . mupirocin ointment  1 application Nasal BID  . [START ON 05/06/2013] potassium chloride  20 mEq Oral BID  . sertraline  50 mg Oral Daily  . tamsulosin  0.4 mg Oral Daily  . [START ON 05/06/2013] vitamin C  2,000 mg Oral Daily  . [START ON 05/06/2013] zinc sulfate  220 mg Oral Daily   Continuous Infusions: . sodium chloride 20 mL/hr at 05/04/13 2300    Time spent: 35 minutes  Cleotilde Spadaccini L  [facility] Pager [phone]. If 7PM-7AM, please contact night-coverage at [email or web address], password [facility] 05/05/2013, 8:40 AM  LOS: 1 day

## 2013-05-05 NOTE — Consult Note (Signed)
WOC consulted after hours for assistance with [facility] placement however bedside nursing staff able to place (they have this as a nursing competency). No WOC needs this am.   Discussed POC with patient and bedside nurse.  Re consult if needed, will not follow at this time. Thanks  Yatzil Clippinger [employer], CWOCN [phone])

## 2013-05-05 NOTE — Anesthesia Procedure Notes (Signed)
Procedure Name: Intubation Date/Time: 05/05/2013 4:28 PM Performed by: Rogelia Boga Pre-anesthesia Checklist: Patient identified, Emergency Drugs available, Suction available, Patient being monitored and Timeout performed Patient Re-evaluated:Patient Re-evaluated prior to inductionOxygen Delivery Method: Circle system utilized Preoxygenation: Pre-oxygenation with 100% oxygen Intubation Type: IV induction Ventilation: Mask ventilation without difficulty Laryngoscope Size: Mac and 4 Grade View: Grade II Tube type: Oral Tube size: 7.5 mm Number of attempts: 1 Airway Equipment and Method: Stylet Placement Confirmation: ETT inserted through vocal cords under direct vision,  positive ETCO2 and breath sounds checked- equal and bilateral Secured at: 21 cm Tube secured with: Tape Dental Injury: Teeth and Oropharynx as per pre-operative assessment

## 2013-05-05 NOTE — Anesthesia Postprocedure Evaluation (Signed)
  Anesthesia Post-op Note  Patient: [name] [name]  Procedure(s) Performed: Procedure(s): WASH-OUT CLOSURE OF WOUND PARTIAL (N/A)  Patient Location: PACU  Anesthesia Type:General  Level of Consciousness: awake and alert   Airway and Oxygen Therapy: Patient Spontanous Breathing and Patient connected to nasal cannula oxygen  Post-op Pain: none  Post-op Assessment: Post-op Vital signs reviewed, Patient's Cardiovascular Status Stable, Respiratory Function Stable and Patent Airway  Post-op Vital Signs: Reviewed and stable  Complications: No apparent anesthesia complications

## 2013-05-05 NOTE — Anesthesia Preprocedure Evaluation (Addendum)
Anesthesia Evaluation  Patient identified by MRN, date of birth, ID band Patient awake    Reviewed: Allergy & Precautions, H&P , NPO status , Patient's Chart, lab work & pertinent test results  Airway Mallampati: I TM Distance: >3 FB Neck ROM: full    Dental  (+) Edentulous Upper   Pulmonary          Cardiovascular hypertension, Pt. on medications + CAD, + Past MI and + CABG Rhythm:regular Rate:Normal     Neuro/Psych    GI/Hepatic   Endo/Other  diabetes, Type 2, Insulin Dependent and Oral Hypoglycemic Agents  Renal/GU Renal disease     Musculoskeletal   Abdominal   Peds  Hematology  (+) Blood dyscrasia, anemia ,   Anesthesia Other Findings   Reproductive/Obstetrics                          Anesthesia Physical Anesthesia Plan  ASA: III  Anesthesia Plan: General   Post-op Pain Management:    Induction: Intravenous  Airway Management Planned: Oral ETT  Additional Equipment:   Intra-op Plan:   Post-operative Plan: Extubation in OR  Informed Consent: I have reviewed the patients History and Physical, chart, labs and discussed the procedure including the risks, benefits and alternatives for the proposed anesthesia with the patient or authorized representative who has indicated his/her understanding and acceptance.     Plan Discussed with: Anesthesiologist, CRNA and Surgeon  Anesthesia Plan Comments:         Anesthesia Quick Evaluation

## 2013-05-05 NOTE — Transfer of Care (Signed)
Immediate Anesthesia Transfer of Care Note  Patient: [name] [name]  Procedure(s) Performed: Procedure(s): WASH-OUT CLOSURE OF WOUND PARTIAL (N/A)  Patient Location: PACU  Anesthesia Type:General  Level of Consciousness: awake and patient cooperative  Airway & Oxygen Therapy: Patient Spontanous Breathing and Patient connected to nasal cannula oxygen  Post-op Assessment: Report given to PACU RN, Post -op Vital signs reviewed and stable and Patient moving all extremities X 4  Post vital signs: Reviewed and stable  Complications: No apparent anesthesia complications

## 2013-05-05 NOTE — Preoperative (Signed)
Beta Blockers   Reason not to administer Beta Blockers:Not Applicable 

## 2013-05-06 ENCOUNTER — Inpatient Hospital Stay
Admission: AD | Admit: 2013-05-06 | Discharge: 2013-05-17 | Disposition: A | Discharge status: Other Acute Inpatient Hospital | Payer: Medicare Other | Source: Ambulatory Visit | Attending: Internal Medicine | Admitting: Internal Medicine

## 2013-05-06 DIAGNOSIS — I2510 Atherosclerotic heart disease of native coronary artery without angina pectoris: Secondary | ICD-10-CM

## 2013-05-06 LAB — BASIC METABOLIC PANEL
CO2: 27 mEq/L (ref 19–32)
Calcium: 8.1 mg/dL — ABNORMAL LOW (ref 8.4–10.5)
Chloride: 110 mEq/L (ref 96–112)
GFR calc Af Amer: >90 mL/min (ref >90)
Sodium: 143 mEq/L (ref 135–145)

## 2013-05-06 LAB — CBC
MCH: 30.4 pg (ref 26.0–34.0)
Platelets: 105 10*3/uL — ABNORMAL LOW (ref 150–400)
RBC: 2.6 MIL/uL — ABNORMAL LOW (ref 4.22–5.81)
RDW: 21.4 % — ABNORMAL HIGH (ref 11.5–15.5)
WBC: 6.2 10*3/uL (ref 4.0–10.5)

## 2013-05-06 LAB — IRON AND TIBC
Iron: 19 ug/dL — ABNORMAL LOW (ref 42–135)
UIBC: 121 ug/dL — ABNORMAL LOW (ref 125–400)

## 2013-05-06 LAB — GLUCOSE, CAPILLARY

## 2013-05-06 MED ORDER — FERROUS SULFATE 325 (65 FE) MG PO TABS
325.0000 mg | ORAL_TABLET | Freq: Three times a day (TID) | ORAL | Status: DC
  Filled 2013-05-06 (×2): qty 1

## 2013-05-06 MED ORDER — IPRATROPIUM BROMIDE 0.02 % IN SOLN: 0.5000 mg | Freq: Two times a day (BID) | RESPIRATORY_TRACT | Status: DC

## 2013-05-06 MED ORDER — POTASSIUM CHLORIDE CRYS ER 20 MEQ PO TBCR
20.0000 meq | EXTENDED_RELEASE_TABLET | Freq: Two times a day (BID) | ORAL | Status: DC
  Administered 2013-05-06: 13:00:00 20 meq via ORAL

## 2013-05-06 MED ORDER — ALBUTEROL SULFATE (5 MG/ML) 0.5% IN NEBU: 2.5000 mg | INHALATION_SOLUTION | Freq: Two times a day (BID) | RESPIRATORY_TRACT | Status: DC

## 2013-05-06 MED ORDER — IPRATROPIUM BROMIDE 0.02 % IN SOLN
RESPIRATORY_TRACT | Status: AC
  Filled 2013-05-06: qty 2.5

## 2013-05-06 MED ORDER — ALBUTEROL SULFATE (5 MG/ML) 0.5% IN NEBU
INHALATION_SOLUTION | RESPIRATORY_TRACT | Status: AC
  Filled 2013-05-06: qty 0.5

## 2013-05-06 MED ORDER — FUROSEMIDE 20 MG PO TABS
20.0000 mg | ORAL_TABLET | Freq: Two times a day (BID) | ORAL | Status: DC
  Filled 2013-05-06 (×2): qty 1

## 2013-05-06 MED ORDER — SODIUM CHLORIDE 0.9 % IJ SOLN
10.0000 mL | INTRAMUSCULAR | Status: DC | PRN
  Administered 2013-05-06: 16:00:00 30 mL
  Administered 2013-05-06: 40 mL

## 2013-05-06 NOTE — Anesthesia Postprocedure Evaluation (Signed)
  Anesthesia Post-op Note  Patient: [name] [name]  Procedure(s) Performed: Procedure(s): WASH-OUT CLOSURE OF WOUND PARTIAL (N/A)  Patient Location: PACU  Anesthesia Type:General  Level of Consciousness: awake and alert   Airway and Oxygen Therapy: Patient Spontanous Breathing  Post-op Pain: mild  Post-op Assessment: Post-op Vital signs reviewed, Patient's Cardiovascular Status Stable, Respiratory Function Stable, Patent Airway, No signs of Nausea or vomiting and Pain level controlled  Post-op Vital Signs: Reviewed and stable  Complications: No apparent anesthesia complications

## 2013-05-06 NOTE — Consult Note (Signed)
WOC noted patient to be transferred to [facility] today, will need VAC dressing changed Saturday 05/07/13.  Will sign off Zakary Kimura Eliberto Ivory RN,CWOCN [phone]

## 2013-05-06 NOTE — Progress Notes (Signed)
INITIAL NUTRITION ASSESSMENT  DOCUMENTATION CODES Per approved criteria  -Severe malnutrition in the context of acute illness or injury   INTERVENTION: 1. Continue Glucerna Shake po daily, each supplement provides 220 kcal and 10 grams of protein.   2. Continue 30 ml Pro-stat BID, each supplement provides 100 kcal and 15 gm protein    NUTRITION DIAGNOSIS: Increased nutrient needs related to wound healing as evidenced by estimated nutrition needs.   Goal: PO intake to support wound healing.   Monitor:  PO intake, weight trends, labs, wound healing   Reason for Assessment: Low Braden Score  79 y.o. male  Admitting Dx: wound closure   ASSESSMENT: Pt was transferred from [facility] to [facility] for change of wound vac and surgical closer of wound. Pt had a burst fracture and had a fixation but hardware failed and required I&D and wound vac.   Weight loss of 24 lbs (11% body weight) in the past month per weight hx. Was receiving oral nutrition supplements at last admission for severe malnutrition in the setting of acute illness/injury.   Nutrition Focused Physical Exam:  Subcutaneous Fat:  Orbital Region: mild wasting Upper Arm Region: WNL Thoracic and Lumbar Region: n/a  Muscle:  Temple Region: moderate wasting Clavicle Bone Region: moderate wasting Clavicle and Acromion Bone Region: moderate wasting Scapular Bone Region: n/a Dorsal Hand: WNL Patellar Region: moderate wasting Anterior Thigh Region: moderate wasting Posterior Calf Region: moderate wasting  Edema: none   Pt meets criteria for severe malnutrition in the context of acute illness/injury 2/2 weight loss of 11% body weight in 1 month and moderate muscle wasting.    Height: Ht Readings from Last 1 Encounters:  04/11/13 5\' 10"  (1.778 m)    Weight: Wt Readings from Last 1 Encounters:  05/04/13 185 lb 9.6 oz (84.188 kg)    Ideal Body Weight: 166 lbs   % Ideal Body Weight: 111%  Wt Readings from Last 10  Encounters:  05/04/13 185 lb 9.6 oz (84.188 kg)  05/04/13 185 lb 9.6 oz (84.188 kg)  04/13/13 211 lb 6.7 oz (95.9 kg)  04/13/13 211 lb 6.7 oz (95.9 kg)  04/13/13 211 lb 6.7 oz (95.9 kg)  04/01/13 204 lb 12.9 oz (92.9 kg)  04/01/13 204 lb 12.9 oz (92.9 kg)  03/15/13 202 lb (91.627 kg)  03/11/13 202 lb (91.627 kg)  03/08/13 203 lb (92.08 kg)    Usual Body Weight: 202 lbs   % Usual Body Weight: 91%  BMI:  Body mass index is 26.63 kg/(m^2). overweight   Estimated Nutritional Needs: Kcal: 2000-2200 Protein: 115-125 gm  Fluid: 2-2.2 L   Skin: stage I sacrum, stage II buttocks, back incisions and wound vac  Diet Order: Clear Liquid  EDUCATION NEEDS: -No education needs identified at this time   Intake/Output Summary (Last 24 hours) at 05/06/13 1029 Last data filed at 05/05/13 1842  Gross per 24 hour  Intake   1050 ml  Output    500 ml  Net    550 ml    Last BM: PTA    Labs:   Recent Labs Lab 05/03/13 0500 05/04/13 1821 05/05/13 0500 05/06/13 0545  NA 145 141 139 143  K 4.0 3.9 3.4* 3.5  CL 112 108 107 110  CO2 26 28 26 27   BUN 23 22 22 21   CREATININE 0.59 0.61 0.58 0.58  CALCIUM 8.4 7.9* 8.0* 8.1*  MG 2.1  --   --  1.8  PHOS 2.8  --   --   --  GLUCOSE 109* 105* 91 76    CBG (last 3)   Recent Labs  05/05/13 1743 05/05/13 2125 05/06/13 0837  GLUCAP 113* 104* 79    Scheduled Meds: . albuterol  2.5 mg Nebulization QID  . antiseptic oral rinse  15 mL Mouth Rinse BID  . atorvastatin  10 mg Oral q1800  . brimonidine  1 drop Both Eyes BID  . calcium-vitamin D  1 tablet Oral Q breakfast  . ceFEPime (MAXIPIME) IV  2 g Intravenous Q24H  . Chlorhexidine Gluconate Cloth  6 each Topical Q0600  . cholecalciferol  1,000 Units Oral Daily  . docusate sodium  100 mg Oral BID  . dronabinol  2.5 mg Oral TID  . famotidine  20 mg Oral Daily  . feeding supplement  237 mL Oral Daily  . feeding supplement  30 mL Oral BID  . insulin aspart  0-9 Units Subcutaneous  TID WC  . ipratropium  0.5 mg Nebulization QID  . lactobacillus  1 g Oral Daily  . lisinopril  5 mg Oral Daily  . mupirocin ointment  1 application Nasal BID  . potassium chloride  20 mEq Oral BID  . sertraline  50 mg Oral Daily  . sodium chloride  3 mL Intravenous Q12H  . tamsulosin  0.4 mg Oral Daily  . vitamin C  2,000 mg Oral Daily  . zinc sulfate  220 mg Oral Daily    Continuous Infusions: . sodium chloride    . lactated ringers 85 mL/hr at 05/06/13 2956    Past Medical History  Diagnosis Date  . Hypertension   . Chronic kidney disease     bph  . Arthritis   . Coronary artery disease   . High cholesterol   . Myocardial infarction 2002    "before his lungs or legs" (05/05/2013)  . DVT (deep venous thrombosis)     "after back OR in August" (05/05/2013)  . Type II diabetes mellitus   . History of blood transfusion     "some since OR in 03/2013" (05/05/2013)  . Chronic lower back pain   . Gout   . BPH (benign prostatic hypertrophy)   . Prostate cancer     "S/P radiation tx" ((05/05/2013)  . Skin cancer of face     "had several taken off his face; had his nose cut/reconstructed" (05/05/2013)    Past Surgical History  Procedure Laterality Date  . Appendectomy    . Coronary artery bypass graft  11/09/2000    LIMA to LAD, SVG to RPDA, seq SVG to OM2 and D1 by [name] [facility]  . Lumbar fusion  03/15/2013    Dr Shon Baton  . Posterior lumbar fusion 4 level N/A 03/16/2013    Procedure: T9 - L3 POSTERIOR POSTERIOR SPINAL FUSION ;  Surgeon: Venita Lick, MD;  Location: [facility] OR;  Service: Orthopedics;  Laterality: N/A;  . Hardware removal N/A 03/30/2013    Procedure: HARDWARE REMOVAL/IRRIGATION & DEBRIDEMENT OF WOUND ON BACK;  Surgeon: Venita Lick, MD;  Location: [facility] OR;  Service: Orthopedics;  Laterality: N/A;  . Incision and drainage of wound N/A 04/13/2013    Procedure: IRRIGATION AND DEBRIDEMENT WOUND AND VAC DRESSING;  Surgeon: Venita Lick, MD;  Location: [facility] OR;   Service: Orthopedics;  Laterality: N/A;  . Application of wound vac  04/13/2013; 05/04/2013  . Back surgery    . Cataract extraction w/ intraocular lens  implant, bilateral Bilateral    Isabell Jarvis RD, LDN Pager [phone] After  Hours pager [phone]

## 2013-05-06 NOTE — Care Management Note (Signed)
    Page 1 of 1   05/06/2013     4:21:02 PM   CARE MANAGEMENT NOTE 05/06/2013  Patient:  [name],[name]   Account Number:  [id number]  Date Initiated:  05/06/2013  Documentation initiated by:  Letha Cape  Subjective/Objective Assessment:   dx dm, wound closure  admit- from [facility].     Action/Plan:   to return to [facility] per daughter Alona Bene [phone].   Anticipated DC Date:  05/06/2013   Anticipated DC Plan:  LONG TERM ACUTE CARE (LTAC)      DC Planning Services  CM consult      PAC Choice  LONG TERM ACUTE CARE   Choice offered to / List presented to:  C-4 Adult Children           Status of service:  Completed, signed off Medicare Important Message given?   (If response is "NO", the following Medicare IM given date fields will be blank) Date Medicare IM given:   Date Additional Medicare IM given:    Discharge Disposition:  LONG TERM ACUTE CARE (LTAC)  Per UR Regulation:  Reviewed for med. necessity/level of care/duration of stay  If discussed at Long Length of Stay Meetings, dates discussed:    Comments:  05/06/13 10;59 Letha Cape RN, BSN [phone] patient is from [facility], I spoke with Alona Bene the patient's daughter and she states the plan is for patient to return to [facility] when he can be dc.  Spoke with Tommy at [facility] [employer] [facility] and he states if patient is stable he has a bed for him to return to day,  Called Dr. Shon Baton to see if patient is stable to be dc today, he states that the Medical MD would make that decision but there is no Medical MD following patient, Dr. Shon Baton stated that his PA will also be seeing patient today.

## 2013-05-06 NOTE — Progress Notes (Signed)
OT Cancellation Note  Patient Details Name: [name] [name] MRN: [id number] DOB: September 21, 1933   Cancelled Treatment:    Reason Eval/Treat Not Completed: Other (pt transferring back to [facility]. Will defer to OT at [facility]).   05/06/2013 Cipriano Mile OTR/L Pager [phone] Office [phone]

## 2013-05-06 NOTE — Op Note (Signed)
[name] [name], [name] [name]             ACCOUNT NO.:  [id number]  MEDICAL RECORD NO.:  [id number]  LOCATION:  5W12C                        FACILITY:  [facility]  PHYSICIAN:  Alvy Beal, MD    DATE OF BIRTH:  1933-09-14  DATE OF PROCEDURE:  05/05/2013 DATE OF DISCHARGE:                              OPERATIVE REPORT   PREOPERATIVE DIAGNOSIS:  Postoperative spine wound infection.  POSTOPERATIVE DIAGNOSIS:  Postoperative spine wound infection.  OPERATIVE PROCEDURES:  I and D and partial closure of wound.  COMPLICATIONS:  None.  CONDITION:  Stable.  HISTORY:  This is a very pleasant gentleman who on March 16, 2013, underwent a posterior thoracolumbar fusion.  This is complicated by hardware failure and infection.  He subsequently has had 2 washouts, the last one leaving the wound open with a VAC.  The wound has done quite well.  There is granulation tissue with no active signs of infection. As a result, I elected to take him back to the operating room today for partial wound closure.  DESCRIPTION OF PROCEDURE:  The patient was brought to the operating room.  After successful induction of general anesthesia and endotracheal intubation, he was turned prone onto a chest and pelvic roll.  The arms were secured at the side and the back was prepped and draped in a standard fashion.  A time-out was done to confirm patient, procedure, and all other pertinent important data.  The wound edges were freshened with a 15 blade scalpel.  We irrigated the wound copiously with a 2.5 L of fluid.  There was no deep purulent material noted.  At this point, I removed the portion of Prolene vertical mattress sutures.  I had closed the superior portion of the wound.  I then closed the inferior 2/3rd of the wound with interrupted vertical PDS Prolene suture.  This was done very well without any complications.  I then packed the remaining inferior third of the wound with a small VAC dressing and then placed  the entire VAC and sealed it. We then removed the drapes, extubated the patient, and transferred to the PACU without incident.  At the end of the case, needle and sponge counts were correct.  There were no adverse intraoperative events.     Alvy Beal, MD     DDB/MEDQ  D:  05/05/2013  T:  05/06/2013  Job:  [id number]

## 2013-05-06 NOTE — Progress Notes (Signed)
PT Cancellation Note  Patient Details Name: [name] [name] MRN: [id number] DOB: [birth year]   Cancelled Treatment:    Reason Eval/Treat Not Completed: Other (comment) (pt transferring back to [facility] will defer to PT at [facility])   [name] 05/06/2013, 2:10 PM Delaney Meigs, PT [phone]

## 2013-05-06 NOTE — Progress Notes (Addendum)
[facility] PROGRESS NOTE  [name] [name] IHK:[id number] DOB: [birth year] DOA: 05/04/2013 PCP: Provider Not In System  Assessment/Plan:  Osteomyelitis of spine, serratia: to resume previous iv antibiotics following discharge per previous plan (please refer to ID notes) Anemia -  Will probably benefit from iron supplements, I have started that here. Recommend CBC repeat next week. EKG showed nothing acute. Pro BNP of thousand, slightly higher than previous, but patient denies dyspnea and has relatively clear breath sounds. S/p IV Lasix 9/18, resume po Lasix today. Echocardiogram in July shows normal EF DM2 (diabetes mellitus, type 2): Controlled without insulin. Hyperlipidemia on statin Protein-calorie malnutrition, severe: Had been on protein supplementation and health shakes, as well as Marinol. Needs to resume after discharge.  Benign hypertension: On lisinopril Thrombocytopenia, chronic  Patient is clinically stable from medical standpoint for transfer today. Recommend close follow up early next week for repeat blood work (CBC, BMP).  Antibiotics:  cefepime  HPI/Subjective: Denies dyspnea, feels well.  Objective: Filed Vitals:   05/06/13 1046  BP: 123/68  Pulse:   Temp:   Resp:     Intake/Output Summary (Last 24 hours) at 05/06/13 1258 Last data filed at 05/06/13 1200  Gross per 24 hour  Intake   1050 ml  Output    800 ml  Net    250 ml   Filed Weights   05/04/13 2214  Weight: 84.188 kg (185 lb 9.6 oz)    Exam:   General:  Comfortable. Breathing nonlabored  Cardiovascular: Regular rate rhythm without murmurs gallops rubs  Respiratory: Clear to auscultation bilaterally without wheeze rhonchi or rales  Abdomen: Bowel sounds present. Soft, nontender  Ext:  No clubbing cyanosis or edema. TED hose and sequential compression devices in place.  Data Reviewed: Basic Metabolic Panel:  Recent Labs Lab 05/01/13 0550 05/03/13 0500 05/04/13 1821  05/05/13 0500 05/06/13 0545  NA 144 145 141 139 143  K 3.9 4.0 3.9 3.4* 3.5  CL 112 112 108 107 110  CO2 26 26 28 26 27   GLUCOSE 103* 109* 105* 91 76  BUN 26* 23 22 22 21   CREATININE 0.69 0.59 0.61 0.58 0.58  CALCIUM 8.2* 8.4 7.9* 8.0* 8.1*  MG  --  2.1  --   --  1.8  PHOS  --  2.8  --   --   --    Liver Function Tests:  Recent Labs Lab 05/01/13 0550 05/04/13 1821  AST 17 18  ALT 15 17  ALKPHOS 66 71  BILITOT 1.6* 1.2  PROT 4.9* 5.5*  ALBUMIN 1.6* 1.7*   CBC:  Recent Labs Lab 05/01/13 0550 05/03/13 0500 05/04/13 1821 05/05/13 0500 05/06/13 0545  WBC 7.1 7.0 8.5 5.3 6.2  NEUTROABS  --   --  6.7  --   --   HGB 8.7* 9.2* 9.0* 8.1* 7.9*  HCT 27.6* 28.8* 27.7* 25.1* 24.4*  MCV 94.2 93.8 92.0 91.9 93.8  PLT 119* 126* 124* 113* 105*   Cardiac Enzymes: BNP (last 3 results)  Recent Labs  04/16/13 0600 04/20/13 0500 05/04/13 2046  PROBNP 872.7* 569.7* 1031.0*   CBG:  Recent Labs Lab 05/05/13 1544 05/05/13 1743 05/05/13 2125 05/06/13 0837 05/06/13 1147  GLUCAP 104* 113* 104* 79 125*    Recent Results (from the past 240 hour(s))  SURGICAL PCR SCREEN     Status: Abnormal   Collection Time    05/05/13 12:09 AM      Result Value Range Status   MRSA, PCR POSITIVE (*) NEGATIVE  Final   Comment: RESULT CALLED TO, READ BACK BY AND VERIFIED WITH:     REBECCA SPARKS RN [phone] 0300 GREEN R   Staphylococcus aureus POSITIVE (*) NEGATIVE Final   Comment:            The Xpert SA Assay (FDA     approved for NASAL specimens     in patients over 64 years of age),     is one component of     a comprehensive surveillance     program.  Test performance has     been validated by [employer] for patients greater     than or equal to 27 year old.     It is not intended     to diagnose infection nor to     guide or monitor treatment.     RESULT CALLED TO, READ BACK BY AND VERIFIED WITH:     REBECCA SPARKS RN [phone] GREEN R     Studies: X-ray Chest Pa  And Lateral   05/04/2013   *RADIOLOGY REPORT*  Clinical Data: Preop radiograph  CHEST - 2 VIEW  Comparison: 04/27/2013  Findings: There is a left subclavian catheter with tip in the projection of the SVC.  The patient is status post median sternotomy CABG procedure.  The lung volumes are low.  There are bilateral pleural effusions right greater than left. These appear increased from 04/27/2013.  Atelectasis is noted within both lung bases.  There is mild pulmonary vascular congestion identified. Large hiatal hernia is again identified peri  IMPRESSION:  1. Increased and bilateral pleural effusions and bibasilar atelectasis.   Original Report Authenticated By: Signa Kell, M.D.    Scheduled Meds: . albuterol  2.5 mg Nebulization BID  . albuterol      . antiseptic oral rinse  15 mL Mouth Rinse BID  . atorvastatin  10 mg Oral q1800  . brimonidine  1 drop Both Eyes BID  . calcium-vitamin D  1 tablet Oral Q breakfast  . ceFEPime (MAXIPIME) IV  2 g Intravenous Q24H  . Chlorhexidine Gluconate Cloth  6 each Topical Q0600  . cholecalciferol  1,000 Units Oral Daily  . docusate sodium  100 mg Oral BID  . dronabinol  2.5 mg Oral TID  . famotidine  20 mg Oral Daily  . feeding supplement  237 mL Oral Daily  . feeding supplement  30 mL Oral BID  . insulin aspart  0-9 Units Subcutaneous TID WC  . ipratropium      . ipratropium  0.5 mg Nebulization BID  . lactobacillus  1 g Oral Daily  . lisinopril  5 mg Oral Daily  . mupirocin ointment  1 application Nasal BID  . potassium chloride  20 mEq Oral BID  . sertraline  50 mg Oral Daily  . sodium chloride  3 mL Intravenous Q12H  . tamsulosin  0.4 mg Oral Daily  . vitamin C  2,000 mg Oral Daily  . zinc sulfate  220 mg Oral Daily   Continuous Infusions: . sodium chloride    . lactated ringers 85 mL/hr at 05/06/13 0865    Time spent: 35 minutes  Pamella Pert  [facility] Pager [phone]. If 7PM-7AM, please contact night-coverage at  [email or web address], password [facility] 05/06/2013, 12:58 PM  LOS: 2 days

## 2013-05-06 NOTE — Progress Notes (Signed)
Subjective: Doing well.  Some back pain,     Objective: Vital signs in last 24 hours: Temp:  [97 F (36.1 C)-98.6 F (37 C)] 97.9 F (36.6 C) (09/19 0430) Pulse Rate:  [77-100] 90 (09/19 0430) Resp:  [16-26] 20 (09/19 0430) BP: (123-142)/(59-73) 123/68 mmHg (09/19 1046) SpO2:  [93 %-99 %] 97 % (09/19 0430)  Intake/Output from previous day: 09/18 0701 - 09/19 0700 In: 1218.3 [I.V.:1218.3] Out: 700 [Urine:700] Intake/Output this shift:     Recent Labs  05/04/13 1821 05/05/13 0500 05/06/13 0545  HGB 9.0* 8.1* 7.9*    Recent Labs  05/05/13 0500 05/06/13 0545  WBC 5.3 6.2  RBC 2.73* 2.60*  HCT 25.1* 24.4*  PLT 113* 105*    Recent Labs  05/05/13 0500 05/06/13 0545  NA 139 143  K 3.4* 3.5  CL 107 110  CO2 26 27  BUN 22 21  CREATININE 0.58 0.58  GLUCOSE 91 76  CALCIUM 8.0* 8.1*    Recent Labs  05/04/13 1821  INR 1.75*    EXAM:  Neurologically intact.  Skin warm and dry.  bilat calves nontender.    Assessment/Plan: Transfer to select specialty today if cleared my medicine service.  RN will contact Dr Lendell Caprice.  Continue present care.     Heyli Min M 05/06/2013, 12:53 PM

## 2013-05-06 NOTE — H&P (Signed)
Provider Not In System Chief Complaint: Wound infetion History: Patient with known post-operative wound infection.  S/P I+D and placement of wound vac.  Has been admitted on [facility] floor.  Seen in ER today for re-evaluation and possible admission for wound closure.    Past Medical History  Diagnosis Date  . Hypertension   . Chronic kidney disease     bph  . Arthritis   . Coronary artery disease   . High cholesterol   . Myocardial infarction 2002    "before his lungs or legs" (05/05/2013)  . DVT (deep venous thrombosis)     "after back OR in August" (05/05/2013)  . Type II diabetes mellitus   . History of blood transfusion     "some since OR in 03/2013" (05/05/2013)  . Chronic lower back pain   . Gout   . BPH (benign prostatic hypertrophy)   . Prostate cancer     "S/P radiation tx" ((05/05/2013)  . Skin cancer of face     "had several taken off his face; had his nose cut/reconstructed" (05/05/2013)    No Known Allergies  No current facility-administered medications on file prior to encounter.   Current Outpatient Prescriptions on File Prior to Encounter  Medication Sig Dispense Refill  . acetaminophen (TYLENOL) 325 MG tablet Take 650 mg by mouth every 6 (six) hours as needed for pain.      [name] antiseptic oral rinse (BIOTENE) LIQD 15 mLs by Mouth Rinse route 2 (two) times daily.      . brimonidine (ALPHAGAN) 0.2 % ophthalmic solution Place 1 drop into both eyes 2 (two) times daily.      [name] docusate sodium (COLACE) 100 MG capsule Take 1 capsule (100 mg total) by mouth 3 (three) times daily as needed for constipation.  30 capsule  0  . methocarbamol (ROBAXIN) 500 MG tablet Take 1 tablet (500 mg total) by mouth 3 (three) times daily as needed.  60 tablet  0  . Multiple Vitamins-Minerals (MULTIVITAMIN WITH MINERALS) tablet Take 1 tablet by mouth daily.      . ondansetron (ZOFRAN) 4 MG tablet Take 1 tablet (4 mg total) by mouth every 8 (eight) hours as needed for nausea.  20 tablet   0  . polyethylene glycol (MIRALAX / GLYCOLAX) packet Take 17 g by mouth daily.  14 each  0  . tamsulosin (FLOMAX) 0.4 MG CAPS Take 0.4 mg by mouth daily.        Physical Exam: Filed Vitals:   05/06/13 1046  BP: 123/68  Pulse:   Temp:   Resp:    Alert and orientated to person and place No sob/cp abd soft/nt Moving all extremities  Intact peripheral pulses Compartments soft/nt Wound: no drainage, no erythema, granulation tissue present Image: X-ray Chest Pa And Lateral   05/04/2013   *RADIOLOGY REPORT*  Clinical Data: Preop radiograph  CHEST - 2 VIEW  Comparison: 04/27/2013  Findings: There is a left subclavian catheter with tip in the projection of the SVC.  The patient is status post median sternotomy CABG procedure.  The lung volumes are low.  There are bilateral pleural effusions right greater than left. These appear increased from 04/27/2013.  Atelectasis is noted within both lung bases.  There is mild pulmonary vascular congestion identified. Large hiatal hernia is again identified peri  IMPRESSION:  1. Increased and bilateral pleural effusions and bibasilar atelectasis.   Original Report Authenticated By: Signa Kell, M.D.   Ct Head Wo Contrast  04/26/2013   *  RADIOLOGY REPORT*  Clinical Data: Increased confusion.  Back surgery on 03/16/2013.  CT HEAD WITHOUT CONTRAST  Technique:  Contiguous axial images were obtained from the base of the skull through the vertex without contrast.  Comparison: None.  Findings: The ventricles are normal in size, the ventricles are normal in configuration.  There is ventricular and sulcal enlargement reflecting mild atrophy.  There is a well-defined focus of hypoattenuation in the right centrum semiovale only consistent with a small old lacunar infarct.  A tiny old lacunar infarct is suggested along the medial left caudate nucleus head.  There are patchy areas of predominantly periventricular white matter hypoattenuation, which are most consistent with  chronic microvascular ischemic change.  There are no parenchymal masses or mass effect.  There is no evidence of a recent transcortical infarct.  There are no extra-axial masses or abnormal fluid collections.  There is no intracranial hemorrhage.  The sinuses and mastoid air cells are clear.  No skull lesion.  IMPRESSION: No acute intracranial abnormalities.  Mild atrophy and chronic microvascular ischemic change.  Small lacunar infarcts in the right centrum semiovale only and left caudate nucleus head.   Original Report Authenticated By: Amie Portland, M.D.   US Abdomen Complete  04/18/2013   *RADIOLOGY REPORT*  Clinical Data:  Jaundice.  ABDOMEN ULTRASOUND  Technique:  Complete abdominal ultrasound examination was performed including evaluation of the liver, gallbladder, bile ducts, pancreas, kidneys, spleen, IVC, and abdominal aorta.  Comparison:  None  Findings:  Gallbladder:  No gallstones identified.  The gallbladder wall is accentuated by surrounding ascites.  Common Bile Duct:  Normal caliber with maximal diameter of 6 mm.  Liver:  Gross and likely advanced cirrhotic changes are identified of the liver which is shrunken in appearance and heterogeneous in echotexture.  No definite focal hepatic masses are identified. Color Doppler flow is detected in the main portal vein.  IVC:  The IVC is very poorly visualized by ultrasound.  Pancreas:  The pancreas is very poorly visualized and essentially nonvisualized by ultrasound.  Spleen:  The spleen appears likely mildly enlarged and homogeneous in echotexture.  Kidneys:  The right kidney measures 10.5 cm and the left kidney 13.1 cm.  There is suggestion of cortical atrophy on the right.  No hydronephrosis or focal renal lesions are identified.  Abdominal Aorta:  Visualized segments of the abdominal aorta are normal caliber.  Ascites is present around the liver and gallbladder.  A small amount of ascites is seen adjacent to the spleen.  IMPRESSION: Changes by  ultrasound consistent with advanced cirrhosis with associated ascites and suggestion of mild splenomegaly.  No definite hepatic masses are identified by ultrasound.   Original Report Authenticated By: Irish Lack, M.D.   Dg Chest Port 1 View  04/27/2013   *RADIOLOGY REPORT*  Clinical Data: Dyspnea  PORTABLE CHEST - 1 VIEW  Comparison: 04/22/2013  Findings: Bibasilar heterogeneous opacities unchanged.  Left subclavian central venous catheter unchanged.  No pneumothorax. Low volumes.  Left pleural effusions suspected.  IMPRESSION: Stable.  Bibasilar airspace disease.  Left effusion.  Low volumes.   Original Report Authenticated By: Jolaine Click, M.D.   Dg Chest Port 1 View  04/22/2013   *RADIOLOGY REPORT*  Clinical Data: Pulmonary edema.  PORTABLE CHEST - 1 VIEW  Comparison: 04/20/2013.  Findings: Interval improved lung aeration with near complete resolution of pulmonary edema.  Small left pleural effusion and persistent streaky bibasilar atelectasis.  The left subclavian central venous catheter is stable.  IMPRESSION:  1.  Improved aeration with near complete resolution of the pulmonary edema. 2.  Small left pleural effusion and bibasilar atelectasis.   Original Report Authenticated By: Rudie Meyer, M.D.   Dg Chest Port 1 View  04/20/2013   *RADIOLOGY REPORT*  Clinical Data: Pneumonia, shortness of breath  PORTABLE CHEST - 1 VIEW  Comparison: Prior radiograph from 04/16/2013  Findings: Sternotomy wires again noted.  Transverse heart size is grossly stable.  Course atherosclerotic calcifications are again noted within the aortic arch.  The lungs remain hypoinflated.  There is diffuse prominence of the interstitial markings with perihilar vascular congestion, consistent with interstitial edema.  Bibasilar airspace opacities are not significantly changed as compared to the prior exam. Veiling opacity overlying both hemidiaphragms are consistent with pleural effusions.  No acute osseous abnormality.   IMPRESSION:  1.  No significant interval change in extensive bibasilar airspace opacity and bilateral pleural effusions. 2.  Superimposed edema, not significantly changed.   Original Report Authenticated By: Rise Mu, M.D.   Dg Chest Port 1 View  04/16/2013   *RADIOLOGY REPORT*  Clinical Data: Respiratory distress  PORTABLE CHEST - 1 VIEW  Comparison: 04/13/2013  Findings: The left-sided central venous line is again identified and stable.  Cardiac shadow is stable.  Postsurgical changes are seen.  Increasing bibasilar atelectasis with bilateral pleural effusions is noted right greater than left.  No acute bony abnormality is noted.  IMPRESSION: Increasing bibasilar atelectasis with bilateral effusions as described.   Original Report Authenticated By: Alcide Clever, M.D.   Dg Chest Port 1 View  04/13/2013   *RADIOLOGY REPORT*  Clinical Data: Post central line placement.  PORTABLE CHEST - 1 VIEW  Comparison: 04/13/2013 at 0426 hours and CT chest 03/31/2013.  Findings: Trachea is midline.  Heart size stable.  Left subclavian central line tip projects over the SVC.  No definite pneumothorax. Heart size stable.  Lungs are low in volume with slight interval improvement in basilar dependent interstitial prominence and indistinctness. Left lower lobe air space disease persists.  Small left pleural effusion.  IMPRESSION:  1.  Left subclavian central line placement without definite pneumothorax. 2.  Slight improvement in bibasilar dependent edema. 3.  Persistent left lower lobe air space disease. 4.  Small left pleural effusion.   Original Report Authenticated By: Leanna Battles, M.D.   Dg Chest Port 1 View  04/13/2013   *RADIOLOGY REPORT*  Clinical Data: Follow up pulmonary infiltrates  PORTABLE CHEST - 1 VIEW  Comparison: Prior chest x-ray 08/26 1014  Findings: Slightly improved inspiratory volumes and decreasing pulmonary edema.  Persistent layering pleural effusions and associated bibasilar atelectasis  versus infiltrate. Prior median sternotomy.  Stable aortic atherosclerosis and cardiomegaly.  No pneumothorax.  IMPRESSION:  1.  Slightly improved inspiratory volumes and decreased pulmonary edema. 2.  Persistent small bilateral layering pleural effusions and associated bibasilar atelectasis versus infiltrate.   Original Report Authenticated By: Malachy Moan, M.D.   Dg Chest Port 1 View  04/12/2013   *RADIOLOGY REPORT*  Clinical Data: Respiratory failure  PORTABLE CHEST - 1 VIEW  Comparison: Prior chest x-ray 04/11/2013  Findings: Persistent very low lung volumes with slightly increased pulmonary vascular congestion and pulmonary edema.  Probable small left greater than right layering pleural effusions.  Cardiomegaly is similar compared to prior.  Atherosclerotic calcifications noted in the transverse and descending thoracic aorta.  The patient is status post median sternotomy.  Linear calcification in the left upper quadrant is likely peri splenic after correlation with prior CT imaging.  No  pneumothorax or acute osseous abnormality.  IMPRESSION:  1.  Worsening pulmonary edema. 2.  Small bilateral layering pleural effusions with associated atelectasis versus infiltrate. 3.  Inspiratory volumes remain very low.   Original Report Authenticated By: Malachy Moan, M.D.   Dg Chest Portable 1 View  04/11/2013   *RADIOLOGY REPORT*  Clinical Data: Sepsis  PORTABLE CHEST - 1 VIEW  Comparison: 04/11/2013; 04/04/2013  Findings: Grossly unchanged enlarged cardiac silhouette and mediastinal contours post median sternotomy given persistently reduced lung volumes.  Atherosclerotic calcifications within the thoracic aorta. Interval removal of right upper extremity approach PICC line.  The pulmonary vasculature is less distinct on the present examination.  Worsening bibasilar heterogeneous and consolidative opacities, left greater than right.  Trace bilateral effusions are not excluded.  No pneumothorax.  Unchanged  bones.  IMPRESSION: 1.  Decreased lung volumes with findings suggestive of pulmonary edema and worsening bibasilar opacities, left greater than right, atelectasis versus infiltrate. 2.  Interval removal of PICC line.   Original Report Authenticated By: Tacey Ruiz, MD   Dg Chest Portable 1 View  04/11/2013   *RADIOLOGY REPORT*  Clinical Data: Short of breath, lung infection  PORTABLE CHEST - 1 VIEW  Comparison: Prior chest x-ray 04/04/2013  Findings: Right upper extremity PICC.  The catheter tip projects over the superior cavoatrial junction.  Stable cardiac and mediastinal contours.  Continued left retrocardiac opacity and probable small left pleural effusion.  Stable pulmonary vascular congestion without overt edema and linear right basilar atelectasis.  IMPRESSION: No significant interval change in the appearance of the chest compared to 04/04/2013.  Persistent left greater than right basilar atelectasis versus consolidation.  The tip of the right upper extremity PICC projects over the superior cavoatrial junction.   Original Report Authenticated By: Malachy Moan, M.D.    A/P:  Patient seen and evaluated by me yesterday.  Note not in system.  Patient seen in ER for re-evaluation of known spine wound infection. 1.  Wound appears healthy.  No purulent odor or drainage.  Granulation tissue is present 2. Malnutrition still an issue.  Continue supplements.  May require feeding tube.  Will address at later point 3. Medical evaluation for pre-surgical clearance 4. Plan on return to OR Thursday for partial wound closure and repeat I+D

## 2013-05-07 LAB — CBC WITH DIFFERENTIAL/PLATELET
Basophils Absolute: 0 10*3/uL (ref 0.0–0.1)
Eosinophils Relative: 1 % (ref 0–5)
HCT: 30.9 % — ABNORMAL LOW (ref 39.0–52.0)
Lymphocytes Relative: 14 % (ref 12–46)
Lymphs Abs: 1.1 10*3/uL (ref 0.7–4.0)
MCV: 93.6 fL (ref 78.0–100.0)
Monocytes Absolute: 0.6 10*3/uL (ref 0.1–1.0)
RDW: 21 % — ABNORMAL HIGH (ref 11.5–15.5)
WBC: 7.8 10*3/uL (ref 4.0–10.5)

## 2013-05-07 LAB — COMPREHENSIVE METABOLIC PANEL
CO2: 22 mEq/L (ref 19–32)
Calcium: 8.2 mg/dL — ABNORMAL LOW (ref 8.4–10.5)
Creatinine, Ser: 0.51 mg/dL (ref 0.50–1.35)
GFR calc Af Amer: >90 mL/min (ref >90)
GFR calc non Af Amer: >90 mL/min (ref >90)
Glucose, Bld: 116 mg/dL — ABNORMAL HIGH (ref 70–99)

## 2013-05-07 LAB — PHOSPHORUS: Phosphorus: 2.6 mg/dL (ref 2.3–4.6)

## 2013-05-07 LAB — MAGNESIUM: Magnesium: 1.9 mg/dL (ref 1.5–2.5)

## 2013-05-08 ENCOUNTER — Encounter (HOSPITAL_COMMUNITY): Payer: Self-pay | Admitting: Orthopedic Surgery

## 2013-05-09 LAB — URINALYSIS, ROUTINE W REFLEX MICROSCOPIC
Bilirubin Urine: NEGATIVE
Hgb urine dipstick: NEGATIVE
Ketones, ur: NEGATIVE mg/dL
Nitrite: NEGATIVE
pH: 8.5 — ABNORMAL HIGH (ref 5.0–8.0)

## 2013-05-09 LAB — COMPREHENSIVE METABOLIC PANEL
ALT: 14 U/L (ref 0–53)
AST: 15 U/L (ref 0–37)
Albumin: 1.7 g/dL — ABNORMAL LOW (ref 3.5–5.2)
CO2: 23 mEq/L (ref 19–32)
Calcium: 8 mg/dL — ABNORMAL LOW (ref 8.4–10.5)
Chloride: 109 mEq/L (ref 96–112)
GFR calc non Af Amer: >90 mL/min (ref >90)
Sodium: 139 mEq/L (ref 135–145)
Total Bilirubin: 1.2 mg/dL (ref 0.3–1.2)

## 2013-05-09 LAB — CBC WITH DIFFERENTIAL/PLATELET
Basophils Absolute: 0 10*3/uL (ref 0.0–0.1)
Lymphocytes Relative: 28 % (ref 12–46)
Neutro Abs: 4.1 10*3/uL (ref 1.7–7.7)
Platelets: 120 10*3/uL — ABNORMAL LOW (ref 150–400)
RDW: 19.9 % — ABNORMAL HIGH (ref 11.5–15.5)
WBC: 6.5 10*3/uL (ref 4.0–10.5)

## 2013-05-09 LAB — URINE MICROSCOPIC-ADD ON

## 2013-05-09 LAB — C-REACTIVE PROTEIN: CRP: 4.2 mg/dL — ABNORMAL HIGH (ref <0.60)

## 2013-05-10 ENCOUNTER — Inpatient Hospital Stay: Payer: Medicare Other | Admitting: Infectious Disease

## 2013-05-11 LAB — BASIC METABOLIC PANEL
Chloride: 110 mEq/L (ref 96–112)
GFR calc Af Amer: >90 mL/min (ref >90)
GFR calc non Af Amer: >90 mL/min (ref >90)
Potassium: 3.7 mEq/L (ref 3.5–5.1)
Sodium: 139 mEq/L (ref 135–145)

## 2013-05-11 LAB — CBC
MCHC: 31.9 g/dL (ref 30.0–36.0)
Platelets: 132 10*3/uL — ABNORMAL LOW (ref 150–400)
RDW: 19.6 % — ABNORMAL HIGH (ref 11.5–15.5)
WBC: 6.2 10*3/uL (ref 4.0–10.5)

## 2013-05-11 NOTE — Discharge Summary (Signed)
Patient ID: [name] [name] MRN: [id number] DOB/AGE: December 08, 1933 79 y.o.  Admit date: 05/04/2013 Discharge date: 05/11/2013  Admission Diagnoses:  Active Problems:   DM2 (diabetes mellitus, type 2)   Hyperlipidemia   Osteomyelitis of spine   Protein-calorie malnutrition, severe   Anemia   Benign hypertension   Thrombocytopenia   Discharge Diagnoses:  Active Problems:   DM2 (diabetes mellitus, type 2)   Hyperlipidemia   Osteomyelitis of spine   Protein-calorie malnutrition, severe   Anemia   Benign hypertension   Thrombocytopenia  status post Procedure(s): WASH-OUT CLOSURE OF WOUND PARTIAL  Past Medical History  Diagnosis Date  . Hypertension   . Chronic kidney disease     bph  . Arthritis   . Coronary artery disease   . High cholesterol   . Myocardial infarction 2002    "before his lungs or legs" (05/05/2013)  . DVT (deep venous thrombosis)     "after back OR in August" (05/05/2013)  . Type II diabetes mellitus   . History of blood transfusion     "some since OR in 03/2013" (05/05/2013)  . Chronic lower back pain   . Gout   . BPH (benign prostatic hypertrophy)   . Prostate cancer     "S/P radiation tx" ((05/05/2013)  . Skin cancer of face     "had several taken off his face; had his nose cut/reconstructed" (05/05/2013)    Surgeries: Procedure(s): WASH-OUT CLOSURE OF WOUND PARTIAL on 05/04/2013 - 05/05/2013   Consultants:  medical team  Discharged Condition: Improved  Hospital Course: [name] is an 79 y.o. male who was admitted 05/04/2013 for operative treatment of <principal problem not specified>. Patient failed conservative treatments (please see the history and physical for the specifics) and had severe unremitting pain that affects sleep, daily activities and work/hobbies. After pre-op clearance, the patient was taken to the operating room on 05/04/2013 - 05/05/2013 and underwent  Procedure(s): WASH-OUT CLOSURE OF WOUND PARTIAL.    Patient was  given perioperative antibiotics: Anti-infectives   Start     Dose/Rate Route Frequency Ordered Stop   05/04/13 1900  piperacillin-tazobactam (ZOSYN) IVPB 3.375 g  Status:  Discontinued     3.375 g 12.5 mL/hr over 240 Minutes Intravenous Every 8 hours 05/04/13 1851 05/04/13 1853   05/04/13 1900  ceFEPIme (MAXIPIME) 2 g in dextrose 5 % 50 mL IVPB  Status:  Discontinued     2 g 100 mL/hr over 30 Minutes Intravenous Every 24 hours 05/04/13 1853 05/06/13 1718       Patient was given sequential compression devices and early ambulation to prevent DVT.   Patient benefited maximally from hospital stay and there were no complications. At the time of discharge, the patient was urinating/moving their bowels without difficulty, tolerating a regular diet, pain is controlled with oral pain medications and they have been cleared by PT/OT.   Recent vital signs: No data found.    Recent laboratory studies:  Recent Labs  05/09/13 0815 05/11/13 0500  WBC 6.5 6.2  HGB 8.7* 8.8*  HCT 26.7* 27.6*  PLT 120* 132*  NA 139 139  K 3.9 3.7  CL 109 110  CO2 23 21  BUN 12 8  CREATININE 0.45* 0.44*  GLUCOSE 85 79  CALCIUM 8.0* 7.9*     Discharge Medications:     Medication List    ASK your doctor about these medications       acetaminophen 325 MG tablet  Commonly known as:  TYLENOL  Take 650 mg by mouth every 6 (six) hours as needed for pain.     acetaminophen 650 MG suppository  Commonly known as:  TYLENOL  Place 650 mg rectally every 8 (eight) hours as needed for fever.     antiseptic oral rinse Liqd  15 mLs by Mouth Rinse route 2 (two) times daily.     atorvastatin 10 MG tablet  Commonly known as:  LIPITOR  Take 10 mg by mouth daily.     brimonidine 0.2 % ophthalmic solution  Commonly known as:  ALPHAGAN  Place 1 drop into both eyes 2 (two) times daily.     CALCIUM 600+D 600-200 MG-UNIT Tabs  Generic drug:  Calcium Carbonate-Vitamin D  Take 1 tablet by mouth daily.      cholecalciferol 1000 UNITS tablet  Commonly known as:  VITAMIN D  Take 1,000 Units by mouth daily.     dextrose 40 % Gel  Commonly known as:  GLUTOSE  Take 1 Tube by mouth once as needed (for hypotension).     dextrose 5 % SOLN 50 mL with ceFEPIme 2 G SOLR 2 g  Inject 2 g into the vein every 12 (twelve) hours.     dextrose 50 % solution  Inject 11-25 mLs into the vein as needed (as needed, sliding scale).     docusate sodium 100 MG capsule  Commonly known as:  COLACE  Take 1 capsule (100 mg total) by mouth 3 (three) times daily as needed for constipation.     dronabinol 2.5 MG capsule  Commonly known as:  MARINOL  Take 2.5 mg by mouth 3 (three) times daily.     famotidine 20 MG tablet  Commonly known as:  PEPCID  Take 20 mg by mouth daily.     feeding supplement Liqd  Take 30 mLs by mouth 2 (two) times daily.     furosemide 20 MG tablet  Commonly known as:  LASIX  Take 20 mg by mouth 2 (two) times daily.     GLUCAGEN 1 MG Solr injection  Generic drug:  glucagon  Inject 1 mg into the vein once as needed.     GLUCERNA Liqd  Take 237 mLs by mouth daily.     guaiFENesin 100 MG/5ML liquid  Commonly known as:  ROBITUSSIN  Take 200 mg by mouth 3 (three) times daily as needed for cough.     insulin aspart 100 UNIT/ML injection  Commonly known as:  novoLOG  - Inject 0-15 Units into the skin 3 (three) times daily with meals. MODERATE SLIDING SCALE  - CBG 70-150: 0 units  - 151-200: 3 units  - 201-250: 5 units  - 251-300: 8 units  - 301-350: 11 units  - 351-400 15 units  - >400: CALL MD AND OBTAIN STAT LAB VERIFICATION     ipratropium-albuterol 0.5-2.5 (3) MG/3ML Soln  Commonly known as:  DUONEB  Take 3 mLs by nebulization every 6 (six) hours as needed (for shortness of breath).     lactobacillus Pack  Take 1 g by mouth daily.     lisinopril 5 MG tablet  Commonly known as:  PRINIVIL,ZESTRIL  Take 5 mg by mouth daily.     methocarbamol 500 MG tablet    Commonly known as:  ROBAXIN  Take 1 tablet (500 mg total) by mouth 3 (three) times daily as needed.     methocarbamol 500 MG tablet  Commonly known as:  ROBAXIN  Take 500 mg by mouth 3 (three)  times daily.     multivitamin with minerals tablet  Take 1 tablet by mouth daily.     ondansetron 4 MG tablet  Commonly known as:  ZOFRAN  Take 1 tablet (4 mg total) by mouth every 8 (eight) hours as needed for nausea.     oxycodone 5 MG capsule  Commonly known as:  OXY-IR  Take 5 mg by mouth every 6 (six) hours as needed for pain.     polyethylene glycol packet  Commonly known as:  MIRALAX / GLYCOLAX  Take 17 g by mouth daily.     potassium chloride 20 MEQ packet  Commonly known as:  KLOR-CON  Take 20 mEq by mouth 2 (two) times daily.     sertraline 50 MG tablet  Commonly known as:  ZOLOFT  Take 50 mg by mouth at bedtime.     sodium chloride 0.65 % nasal spray  Commonly known as:  OCEAN  Place 1 spray into the nose as needed for congestion.     tamsulosin 0.4 MG Caps capsule  Commonly known as:  FLOMAX  Take 0.4 mg by mouth daily.     vitamin C 1000 MG tablet  Take 2,000 mg by mouth daily.     zinc sulfate 220 MG capsule  Take 220 mg by mouth daily.        Diagnostic Studies: X-ray Chest Pa And Lateral   05/04/2013   *RADIOLOGY REPORT*  Clinical Data: Preop radiograph  CHEST - 2 VIEW  Comparison: 04/27/2013  Findings: There is a left subclavian catheter with tip in the projection of the SVC.  The patient is status post median sternotomy CABG procedure.  The lung volumes are low.  There are bilateral pleural effusions right greater than left. These appear increased from 04/27/2013.  Atelectasis is noted within both lung bases.  There is mild pulmonary vascular congestion identified. Large hiatal hernia is again identified peri  IMPRESSION:  1. Increased and bilateral pleural effusions and bibasilar atelectasis.   Original Report Authenticated By: Signa Kell, M.D.   Ct Head  Wo Contrast  04/26/2013   *RADIOLOGY REPORT*  Clinical Data: Increased confusion.  Back surgery on 03/16/2013.  CT HEAD WITHOUT CONTRAST  Technique:  Contiguous axial images were obtained from the base of the skull through the vertex without contrast.  Comparison: None.  Findings: The ventricles are normal in size, the ventricles are normal in configuration.  There is ventricular and sulcal enlargement reflecting mild atrophy.  There is a well-defined focus of hypoattenuation in the right centrum semiovale only consistent with a small old lacunar infarct.  A tiny old lacunar infarct is suggested along the medial left caudate nucleus head.  There are patchy areas of predominantly periventricular white matter hypoattenuation, which are most consistent with chronic microvascular ischemic change.  There are no parenchymal masses or mass effect.  There is no evidence of a recent transcortical infarct.  There are no extra-axial masses or abnormal fluid collections.  There is no intracranial hemorrhage.  The sinuses and mastoid air cells are clear.  No skull lesion.  IMPRESSION: No acute intracranial abnormalities.  Mild atrophy and chronic microvascular ischemic change.  Small lacunar infarcts in the right centrum semiovale only and left caudate nucleus head.   Original Report Authenticated By: Amie Portland, M.D.   US Abdomen Complete  04/18/2013   *RADIOLOGY REPORT*  Clinical Data:  Jaundice.  ABDOMEN ULTRASOUND  Technique:  Complete abdominal ultrasound examination was performed including evaluation of the liver,  gallbladder, bile ducts, pancreas, kidneys, spleen, IVC, and abdominal aorta.  Comparison:  None  Findings:  Gallbladder:  No gallstones identified.  The gallbladder wall is accentuated by surrounding ascites.  Common Bile Duct:  Normal caliber with maximal diameter of 6 mm.  Liver:  Gross and likely advanced cirrhotic changes are identified of the liver which is shrunken in appearance and heterogeneous in  echotexture.  No definite focal hepatic masses are identified. Color Doppler flow is detected in the main portal vein.  IVC:  The IVC is very poorly visualized by ultrasound.  Pancreas:  The pancreas is very poorly visualized and essentially nonvisualized by ultrasound.  Spleen:  The spleen appears likely mildly enlarged and homogeneous in echotexture.  Kidneys:  The right kidney measures 10.5 cm and the left kidney 13.1 cm.  There is suggestion of cortical atrophy on the right.  No hydronephrosis or focal renal lesions are identified.  Abdominal Aorta:  Visualized segments of the abdominal aorta are normal caliber.  Ascites is present around the liver and gallbladder.  A small amount of ascites is seen adjacent to the spleen.  IMPRESSION: Changes by ultrasound consistent with advanced cirrhosis with associated ascites and suggestion of mild splenomegaly.  No definite hepatic masses are identified by ultrasound.   Original Report Authenticated By: Irish Lack, M.D.   Dg Chest Port 1 View  04/27/2013   *RADIOLOGY REPORT*  Clinical Data: Dyspnea  PORTABLE CHEST - 1 VIEW  Comparison: 04/22/2013  Findings: Bibasilar heterogeneous opacities unchanged.  Left subclavian central venous catheter unchanged.  No pneumothorax. Low volumes.  Left pleural effusions suspected.  IMPRESSION: Stable.  Bibasilar airspace disease.  Left effusion.  Low volumes.   Original Report Authenticated By: Jolaine Click, M.D.   Dg Chest Port 1 View  04/22/2013   *RADIOLOGY REPORT*  Clinical Data: Pulmonary edema.  PORTABLE CHEST - 1 VIEW  Comparison: 04/20/2013.  Findings: Interval improved lung aeration with near complete resolution of pulmonary edema.  Small left pleural effusion and persistent streaky bibasilar atelectasis.  The left subclavian central venous catheter is stable.  IMPRESSION:  1.  Improved aeration with near complete resolution of the pulmonary edema. 2.  Small left pleural effusion and bibasilar atelectasis.   Original  Report Authenticated By: Rudie Meyer, M.D.   Dg Chest Port 1 View  04/20/2013   *RADIOLOGY REPORT*  Clinical Data: Pneumonia, shortness of breath  PORTABLE CHEST - 1 VIEW  Comparison: Prior radiograph from 04/16/2013  Findings: Sternotomy wires again noted.  Transverse heart size is grossly stable.  Course atherosclerotic calcifications are again noted within the aortic arch.  The lungs remain hypoinflated.  There is diffuse prominence of the interstitial markings with perihilar vascular congestion, consistent with interstitial edema.  Bibasilar airspace opacities are not significantly changed as compared to the prior exam. Veiling opacity overlying both hemidiaphragms are consistent with pleural effusions.  No acute osseous abnormality.  IMPRESSION:  1.  No significant interval change in extensive bibasilar airspace opacity and bilateral pleural effusions. 2.  Superimposed edema, not significantly changed.   Original Report Authenticated By: Rise Mu, M.D.   Dg Chest Port 1 View  04/16/2013   *RADIOLOGY REPORT*  Clinical Data: Respiratory distress  PORTABLE CHEST - 1 VIEW  Comparison: 04/13/2013  Findings: The left-sided central venous line is again identified and stable.  Cardiac shadow is stable.  Postsurgical changes are seen.  Increasing bibasilar atelectasis with bilateral pleural effusions is noted right greater than left.  No acute bony abnormality  is noted.  IMPRESSION: Increasing bibasilar atelectasis with bilateral effusions as described.   Original Report Authenticated By: Alcide Clever, M.D.   Dg Chest Port 1 View  04/13/2013   *RADIOLOGY REPORT*  Clinical Data: Post central line placement.  PORTABLE CHEST - 1 VIEW  Comparison: 04/13/2013 at 0426 hours and CT chest 03/31/2013.  Findings: Trachea is midline.  Heart size stable.  Left subclavian central line tip projects over the SVC.  No definite pneumothorax. Heart size stable.  Lungs are low in volume with slight interval improvement  in basilar dependent interstitial prominence and indistinctness. Left lower lobe air space disease persists.  Small left pleural effusion.  IMPRESSION:  1.  Left subclavian central line placement without definite pneumothorax. 2.  Slight improvement in bibasilar dependent edema. 3.  Persistent left lower lobe air space disease. 4.  Small left pleural effusion.   Original Report Authenticated By: Leanna Battles, M.D.   Dg Chest Port 1 View  04/13/2013   *RADIOLOGY REPORT*  Clinical Data: Follow up pulmonary infiltrates  PORTABLE CHEST - 1 VIEW  Comparison: Prior chest x-ray 08/26 1014  Findings: Slightly improved inspiratory volumes and decreasing pulmonary edema.  Persistent layering pleural effusions and associated bibasilar atelectasis versus infiltrate. Prior median sternotomy.  Stable aortic atherosclerosis and cardiomegaly.  No pneumothorax.  IMPRESSION:  1.  Slightly improved inspiratory volumes and decreased pulmonary edema. 2.  Persistent small bilateral layering pleural effusions and associated bibasilar atelectasis versus infiltrate.   Original Report Authenticated By: Malachy Moan, M.D.   Dg Chest Port 1 View  04/12/2013   *RADIOLOGY REPORT*  Clinical Data: Respiratory failure  PORTABLE CHEST - 1 VIEW  Comparison: Prior chest x-ray 04/11/2013  Findings: Persistent very low lung volumes with slightly increased pulmonary vascular congestion and pulmonary edema.  Probable small left greater than right layering pleural effusions.  Cardiomegaly is similar compared to prior.  Atherosclerotic calcifications noted in the transverse and descending thoracic aorta.  The patient is status post median sternotomy.  Linear calcification in the left upper quadrant is likely peri splenic after correlation with prior CT imaging.  No pneumothorax or acute osseous abnormality.  IMPRESSION:  1.  Worsening pulmonary edema. 2.  Small bilateral layering pleural effusions with associated atelectasis versus infiltrate.  3.  Inspiratory volumes remain very low.   Original Report Authenticated By: Malachy Moan, M.D.   Dg Chest Portable 1 View  04/11/2013   *RADIOLOGY REPORT*  Clinical Data: Sepsis  PORTABLE CHEST - 1 VIEW  Comparison: 04/11/2013; 04/04/2013  Findings: Grossly unchanged enlarged cardiac silhouette and mediastinal contours post median sternotomy given persistently reduced lung volumes.  Atherosclerotic calcifications within the thoracic aorta. Interval removal of right upper extremity approach PICC line.  The pulmonary vasculature is less distinct on the present examination.  Worsening bibasilar heterogeneous and consolidative opacities, left greater than right.  Trace bilateral effusions are not excluded.  No pneumothorax.  Unchanged bones.  IMPRESSION: 1.  Decreased lung volumes with findings suggestive of pulmonary edema and worsening bibasilar opacities, left greater than right, atelectasis versus infiltrate. 2.  Interval removal of PICC line.   Original Report Authenticated By: Tacey Ruiz, MD   Dg Chest Portable 1 View  04/11/2013   *RADIOLOGY REPORT*  Clinical Data: Short of breath, lung infection  PORTABLE CHEST - 1 VIEW  Comparison: Prior chest x-ray 04/04/2013  Findings: Right upper extremity PICC.  The catheter tip projects over the superior cavoatrial junction.  Stable cardiac and mediastinal contours.  Continued left retrocardiac  opacity and probable small left pleural effusion.  Stable pulmonary vascular congestion without overt edema and linear right basilar atelectasis.  IMPRESSION: No significant interval change in the appearance of the chest compared to 04/04/2013.  Persistent left greater than right basilar atelectasis versus consolidation.  The tip of the right upper extremity PICC projects over the superior cavoatrial junction.   Original Report Authenticated By: Malachy Moan, M.D.         Future Appointments Provider Department Dept Phone   05/18/2013 1:00 PM [facility]  [facility] [phone]   05/18/2013 2:00 PM Chuck Hint, MD [facility] [phone]        Discharge Plan:  [facility]  Disposition: stable    Signed: Venita Lick D for Dr. Venita Lick [facility] [phone] 05/11/2013, 10:23 AM

## 2013-05-13 LAB — CBC
HCT: 27.5 % — ABNORMAL LOW (ref 39.0–52.0)
Hemoglobin: 8.7 g/dL — ABNORMAL LOW (ref 13.0–17.0)
WBC: 9.3 10*3/uL (ref 4.0–10.5)

## 2013-05-13 LAB — BASIC METABOLIC PANEL
BUN: 12 mg/dL (ref 6–23)
Chloride: 113 mEq/L — ABNORMAL HIGH (ref 96–112)
GFR calc Af Amer: >90 mL/min (ref >90)
GFR calc non Af Amer: >90 mL/min (ref >90)
Glucose, Bld: 88 mg/dL (ref 70–99)
Potassium: 3.8 mEq/L (ref 3.5–5.1)
Sodium: 142 mEq/L (ref 135–145)

## 2013-05-15 LAB — BASIC METABOLIC PANEL
BUN: 12 mg/dL (ref 6–23)
CO2: 22 mEq/L (ref 19–32)
GFR calc non Af Amer: >90 mL/min (ref >90)
Glucose, Bld: 92 mg/dL (ref 70–99)
Potassium: 3.5 mEq/L (ref 3.5–5.1)
Sodium: 143 mEq/L (ref 135–145)

## 2013-05-15 LAB — MAGNESIUM: Magnesium: 1.8 mg/dL (ref 1.5–2.5)

## 2013-05-15 LAB — CBC
HCT: 28.6 % — ABNORMAL LOW (ref 39.0–52.0)
Hemoglobin: 9.4 g/dL — ABNORMAL LOW (ref 13.0–17.0)
MCH: 29.9 pg (ref 26.0–34.0)
MCHC: 32.9 g/dL (ref 30.0–36.0)
RBC: 3.14 MIL/uL — ABNORMAL LOW (ref 4.22–5.81)

## 2013-05-16 LAB — C-REACTIVE PROTEIN: CRP: 3.1 mg/dL — ABNORMAL HIGH (ref <0.60)

## 2013-05-16 LAB — PREALBUMIN: Prealbumin: 9.4 mg/dL — ABNORMAL LOW (ref 17.0–34.0)

## 2013-05-16 LAB — URINE CULTURE: Colony Count: >=100000

## 2013-05-16 LAB — SEDIMENTATION RATE: Sed Rate: 15 mm/hr (ref 0–16)

## 2013-05-17 ENCOUNTER — Inpatient Hospital Stay (HOSPITAL_COMMUNITY): Payer: Medicare Other

## 2013-05-17 ENCOUNTER — Inpatient Hospital Stay (HOSPITAL_COMMUNITY)
Admission: AD | Admit: 2013-05-17 | Discharge: 2013-05-24 | DRG: 862 | Disposition: A | Discharge status: Skilled Nursing Facility | Payer: Medicare Other | Source: Other Acute Inpatient Hospital | Attending: Orthopedic Surgery | Admitting: Orthopedic Surgery

## 2013-05-17 DIAGNOSIS — M869 Osteomyelitis, unspecified: Secondary | ICD-10-CM

## 2013-05-17 DIAGNOSIS — D649 Anemia, unspecified: Secondary | ICD-10-CM

## 2013-05-17 DIAGNOSIS — E785 Hyperlipidemia, unspecified: Secondary | ICD-10-CM | POA: Diagnosis present

## 2013-05-17 DIAGNOSIS — I10 Essential (primary) hypertension: Secondary | ICD-10-CM

## 2013-05-17 DIAGNOSIS — T8140XA Infection following a procedure, unspecified, initial encounter: Principal | ICD-10-CM | POA: Diagnosis present

## 2013-05-17 DIAGNOSIS — N401 Enlarged prostate with lower urinary tract symptoms: Secondary | ICD-10-CM | POA: Diagnosis present

## 2013-05-17 DIAGNOSIS — Z1639 Resistance to other specified antimicrobial drug: Secondary | ICD-10-CM | POA: Diagnosis present

## 2013-05-17 DIAGNOSIS — N138 Other obstructive and reflux uropathy: Secondary | ICD-10-CM | POA: Diagnosis present

## 2013-05-17 DIAGNOSIS — B369 Superficial mycosis, unspecified: Secondary | ICD-10-CM | POA: Diagnosis present

## 2013-05-17 DIAGNOSIS — I129 Hypertensive chronic kidney disease with stage 1 through stage 4 chronic kidney disease, or unspecified chronic kidney disease: Secondary | ICD-10-CM | POA: Diagnosis present

## 2013-05-17 DIAGNOSIS — I82409 Acute embolism and thrombosis of unspecified deep veins of unspecified lower extremity: Secondary | ICD-10-CM | POA: Diagnosis present

## 2013-05-17 DIAGNOSIS — I2510 Atherosclerotic heart disease of native coronary artery without angina pectoris: Secondary | ICD-10-CM

## 2013-05-17 DIAGNOSIS — E876 Hypokalemia: Secondary | ICD-10-CM

## 2013-05-17 DIAGNOSIS — N390 Urinary tract infection, site not specified: Secondary | ICD-10-CM | POA: Diagnosis present

## 2013-05-17 DIAGNOSIS — E119 Type 2 diabetes mellitus without complications: Secondary | ICD-10-CM

## 2013-05-17 DIAGNOSIS — R339 Retention of urine, unspecified: Secondary | ICD-10-CM | POA: Diagnosis present

## 2013-05-17 DIAGNOSIS — Y838 Other surgical procedures as the cause of abnormal reaction of the patient, or of later complication, without mention of misadventure at the time of the procedure: Secondary | ICD-10-CM | POA: Diagnosis present

## 2013-05-17 DIAGNOSIS — E43 Unspecified severe protein-calorie malnutrition: Secondary | ICD-10-CM

## 2013-05-17 DIAGNOSIS — E1169 Type 2 diabetes mellitus with other specified complication: Secondary | ICD-10-CM | POA: Diagnosis present

## 2013-05-17 DIAGNOSIS — N189 Chronic kidney disease, unspecified: Secondary | ICD-10-CM | POA: Diagnosis present

## 2013-05-17 DIAGNOSIS — M4626 Osteomyelitis of vertebra, lumbar region: Secondary | ICD-10-CM

## 2013-05-17 DIAGNOSIS — B952 Enterococcus as the cause of diseases classified elsewhere: Secondary | ICD-10-CM | POA: Diagnosis present

## 2013-05-17 DIAGNOSIS — D696 Thrombocytopenia, unspecified: Secondary | ICD-10-CM | POA: Diagnosis present

## 2013-05-17 HISTORY — DX: Shortness of breath: R06.02

## 2013-05-17 LAB — CBC
HCT: 29.8 % — ABNORMAL LOW (ref 39.0–52.0)
Hemoglobin: 9.7 g/dL — ABNORMAL LOW (ref 13.0–17.0)
MCH: 29.2 pg (ref 26.0–34.0)
RBC: 3.32 MIL/uL — ABNORMAL LOW (ref 4.22–5.81)
WBC: 11.7 10*3/uL — ABNORMAL HIGH (ref 4.0–10.5)

## 2013-05-17 LAB — CBC WITH DIFFERENTIAL/PLATELET
Basophils Absolute: 0 10*3/uL (ref 0.0–0.1)
Basophils Relative: 0 % (ref 0–1)
Eosinophils Absolute: 0.1 10*3/uL (ref 0.0–0.7)
Eosinophils Relative: 1 % (ref 0–5)
HCT: 28.1 % — ABNORMAL LOW (ref 39.0–52.0)
Hemoglobin: 9.2 g/dL — ABNORMAL LOW (ref 13.0–17.0)
Lymphocytes Relative: 13 % (ref 12–46)
Lymphs Abs: 1.3 10*3/uL (ref 0.7–4.0)
MCH: 29.5 pg (ref 26.0–34.0)
MCHC: 32.7 g/dL (ref 30.0–36.0)
MCV: 90.1 fL (ref 78.0–100.0)
Monocytes Absolute: 0.5 10*3/uL (ref 0.1–1.0)
Monocytes Relative: 5 % (ref 3–12)
Neutro Abs: 8 10*3/uL — ABNORMAL HIGH (ref 1.7–7.7)
Neutrophils Relative %: 81 % — ABNORMAL HIGH (ref 43–77)
Platelets: 129 10*3/uL — ABNORMAL LOW (ref 150–400)
RBC: 3.12 MIL/uL — ABNORMAL LOW (ref 4.22–5.81)
RDW: 18.6 % — ABNORMAL HIGH (ref 11.5–15.5)
WBC: 9.9 10*3/uL (ref 4.0–10.5)

## 2013-05-17 LAB — BASIC METABOLIC PANEL
BUN: 8 mg/dL (ref 6–23)
CO2: 20 mEq/L (ref 19–32)
Calcium: 8.1 mg/dL — ABNORMAL LOW (ref 8.4–10.5)
Chloride: 112 mEq/L (ref 96–112)
GFR calc non Af Amer: >90 mL/min (ref >90)
Glucose, Bld: 87 mg/dL (ref 70–99)
Potassium: 3.1 mEq/L — ABNORMAL LOW (ref 3.5–5.1)
Sodium: 142 mEq/L (ref 135–145)

## 2013-05-17 LAB — COMPREHENSIVE METABOLIC PANEL
ALT: 12 U/L (ref 0–53)
Albumin: 1.8 g/dL — ABNORMAL LOW (ref 3.5–5.2)
Alkaline Phosphatase: 58 U/L (ref 39–117)
BUN: 10 mg/dL (ref 6–23)
Chloride: 115 mEq/L — ABNORMAL HIGH (ref 96–112)
GFR calc Af Amer: >90 mL/min (ref >90)
Glucose, Bld: 113 mg/dL — ABNORMAL HIGH (ref 70–99)
Potassium: 4.3 mEq/L (ref 3.5–5.1)
Sodium: 145 mEq/L (ref 135–145)
Total Bilirubin: 1 mg/dL (ref 0.3–1.2)
Total Protein: 5.3 g/dL — ABNORMAL LOW (ref 6.0–8.3)

## 2013-05-17 LAB — PROTIME-INR
INR: 1.82 — ABNORMAL HIGH (ref 0.00–1.49)
Prothrombin Time: 20.5 s — ABNORMAL HIGH (ref 11.6–15.2)

## 2013-05-17 LAB — GLUCOSE, CAPILLARY: Glucose-Capillary: 116 mg/dL — ABNORMAL HIGH (ref 70–99)

## 2013-05-17 LAB — APTT: aPTT: 37 s (ref 24–37)

## 2013-05-17 MED ORDER — CALCIUM CARBONATE-VITAMIN D 600-200 MG-UNIT PO TABS: 1.0000 | ORAL_TABLET | Freq: Every day | ORAL | Status: DC

## 2013-05-17 MED ORDER — GLUCERNA PO LIQD: 237.0000 mL | Freq: Every day | ORAL | Status: DC

## 2013-05-17 MED ORDER — FAMOTIDINE 20 MG PO TABS
20.0000 mg | ORAL_TABLET | Freq: Every day | ORAL | Status: DC
  Administered 2013-05-19 – 2013-05-24 (×6): 20 mg via ORAL
  Filled 2013-05-17 (×7): qty 1

## 2013-05-17 MED ORDER — SODIUM CHLORIDE 0.9 % IV SOLN: INTRAVENOUS | Status: DC

## 2013-05-17 MED ORDER — VITAMIN D3 25 MCG (1000 UNIT) PO TABS
1000.0000 [IU] | ORAL_TABLET | Freq: Every day | ORAL | Status: DC
  Administered 2013-05-19 – 2013-05-24 (×6): 1000 [IU] via ORAL
  Filled 2013-05-17 (×7): qty 1

## 2013-05-17 MED ORDER — ATORVASTATIN CALCIUM 10 MG PO TABS
10.0000 mg | ORAL_TABLET | Freq: Every day | ORAL | Status: DC
  Administered 2013-05-19 – 2013-05-24 (×6): 10 mg via ORAL
  Filled 2013-05-17 (×7): qty 1

## 2013-05-17 MED ORDER — CARVEDILOL 3.125 MG PO TABS
3.1250 mg | ORAL_TABLET | Freq: Two times a day (BID) | ORAL | Status: DC
  Administered 2013-05-17 – 2013-05-24 (×13): 3.125 mg via ORAL
  Filled 2013-05-17 (×16): qty 1

## 2013-05-17 MED ORDER — INSULIN ASPART 100 UNIT/ML ~~LOC~~ SOLN
0.0000 [IU] | Freq: Every day | SUBCUTANEOUS | Status: DC
Start: 2013-05-17 — End: 2013-05-24
  Administered 2013-05-20: 1 [IU] via SUBCUTANEOUS

## 2013-05-17 MED ORDER — SERTRALINE HCL 50 MG PO TABS
50.0000 mg | ORAL_TABLET | Freq: Every day | ORAL | Status: DC
  Administered 2013-05-17 – 2013-05-23 (×7): 50 mg via ORAL
  Filled 2013-05-17 (×9): qty 1

## 2013-05-17 MED ORDER — VITAMIN C 500 MG PO TABS
2000.0000 mg | ORAL_TABLET | Freq: Every day | ORAL | Status: DC
  Administered 2013-05-19 – 2013-05-24 (×6): 2000 mg via ORAL
  Filled 2013-05-17 (×7): qty 4

## 2013-05-17 MED ORDER — BRIMONIDINE TARTRATE 0.2 % OP SOLN
1.0000 [drp] | Freq: Two times a day (BID) | OPHTHALMIC | Status: DC
  Administered 2013-05-17 – 2013-05-24 (×14): 1 [drp] via OPHTHALMIC
  Filled 2013-05-17: qty 5

## 2013-05-17 MED ORDER — FLORANEX PO PACK
1.0000 g | PACK | Freq: Every day | ORAL | Status: DC
  Administered 2013-05-19 – 2013-05-24 (×5): 1 g via ORAL
  Filled 2013-05-17 (×7): qty 1

## 2013-05-17 MED ORDER — BIOTENE DRY MOUTH MT LIQD
15.0000 mL | Freq: Two times a day (BID) | OROMUCOSAL | Status: DC
  Administered 2013-05-17 – 2013-05-24 (×14): 15 mL via OROMUCOSAL

## 2013-05-17 MED ORDER — PRO-STAT SUGAR FREE PO LIQD
30.0000 mL | Freq: Two times a day (BID) | ORAL | Status: DC
  Administered 2013-05-17 – 2013-05-24 (×11): 30 mL via ORAL
  Filled 2013-05-17 (×15): qty 30

## 2013-05-17 MED ORDER — ZINC SULFATE 220 (50 ZN) MG PO CAPS
220.0000 mg | ORAL_CAPSULE | Freq: Every day | ORAL | Status: DC
  Administered 2013-05-19 – 2013-05-24 (×6): 220 mg via ORAL
  Filled 2013-05-17 (×7): qty 1

## 2013-05-17 MED ORDER — TAMSULOSIN HCL 0.4 MG PO CAPS
0.4000 mg | ORAL_CAPSULE | Freq: Every day | ORAL | Status: DC
  Administered 2013-05-19 – 2013-05-24 (×6): 0.4 mg via ORAL
  Filled 2013-05-17 (×7): qty 1

## 2013-05-17 MED ORDER — SALINE NASAL SPRAY 0.65 % NA SOLN: 1.0000 | NASAL | Status: DC | PRN

## 2013-05-17 MED ORDER — IPRATROPIUM-ALBUTEROL 0.5-2.5 (3) MG/3ML IN SOLN: 3.0000 mL | Freq: Four times a day (QID) | RESPIRATORY_TRACT | Status: DC | PRN

## 2013-05-17 MED ORDER — CALCIUM CARBONATE-VITAMIN D 500-200 MG-UNIT PO TABS
1.0000 | ORAL_TABLET | Freq: Every day | ORAL | Status: DC
  Administered 2013-05-19 – 2013-05-24 (×6): 1 via ORAL
  Filled 2013-05-17 (×8): qty 1

## 2013-05-17 MED ORDER — POLYETHYLENE GLYCOL 3350 17 G PO PACK
17.0000 g | PACK | Freq: Every day | ORAL | Status: DC
  Administered 2013-05-19: 17 g via ORAL
  Filled 2013-05-17 (×7): qty 1

## 2013-05-17 MED ORDER — FOSFOMYCIN TROMETHAMINE 3 G PO PACK
3.0000 g | PACK | Freq: Once | ORAL | Status: AC
  Administered 2013-05-17: 3 g via ORAL
  Filled 2013-05-17: qty 3

## 2013-05-17 MED ORDER — POTASSIUM CHLORIDE CRYS ER 20 MEQ PO TBCR
40.0000 meq | EXTENDED_RELEASE_TABLET | Freq: Once | ORAL | Status: AC
  Administered 2013-05-17: 40 meq via ORAL
  Filled 2013-05-17: qty 2

## 2013-05-17 MED ORDER — METHOCARBAMOL 500 MG PO TABS
500.0000 mg | ORAL_TABLET | Freq: Four times a day (QID) | ORAL | Status: DC | PRN
  Administered 2013-05-21 – 2013-05-24 (×6): 500 mg via ORAL
  Filled 2013-05-17 (×6): qty 1

## 2013-05-17 MED ORDER — GLUCERNA SHAKE PO LIQD: 237.0000 mL | ORAL | Status: DC

## 2013-05-17 MED ORDER — SALINE SPRAY 0.65 % NA SOLN
1.0000 | NASAL | Status: DC | PRN
  Filled 2013-05-17: qty 44

## 2013-05-17 MED ORDER — DEXTROSE 5 % IV SOLN
2.0000 g | Freq: Two times a day (BID) | INTRAVENOUS | Status: DC
  Administered 2013-05-17 – 2013-05-20 (×4): 2 g via INTRAVENOUS
  Filled 2013-05-17 (×9): qty 2

## 2013-05-17 MED ORDER — HYDRALAZINE HCL 20 MG/ML IJ SOLN: 10.0000 mg | Freq: Four times a day (QID) | INTRAMUSCULAR | Status: DC | PRN

## 2013-05-17 MED ORDER — ADULT MULTIVITAMIN W/MINERALS CH
1.0000 | ORAL_TABLET | Freq: Every day | ORAL | Status: DC
  Administered 2013-05-19 – 2013-05-24 (×6): 1 via ORAL
  Filled 2013-05-17 (×7): qty 1

## 2013-05-17 MED ORDER — MULTI-VITAMIN/MINERALS PO TABS: 1.0000 | ORAL_TABLET | Freq: Every day | ORAL | Status: DC

## 2013-05-17 MED ORDER — INSULIN ASPART 100 UNIT/ML ~~LOC~~ SOLN
0.0000 [IU] | Freq: Three times a day (TID) | SUBCUTANEOUS | Status: DC
  Administered 2013-05-19: 1 [IU] via SUBCUTANEOUS
  Administered 2013-05-20: 2 [IU] via SUBCUTANEOUS
  Administered 2013-05-21 – 2013-05-22 (×2): 1 [IU] via SUBCUTANEOUS
  Administered 2013-05-22 – 2013-05-23 (×2): 2 [IU] via SUBCUTANEOUS
  Administered 2013-05-24: 5 [IU] via SUBCUTANEOUS

## 2013-05-17 MED ORDER — FUROSEMIDE 20 MG PO TABS
20.0000 mg | ORAL_TABLET | Freq: Two times a day (BID) | ORAL | Status: DC
  Administered 2013-05-17 – 2013-05-24 (×12): 20 mg via ORAL
  Filled 2013-05-17 (×16): qty 1

## 2013-05-17 MED ORDER — ACETAMINOPHEN 325 MG PO TABS
650.0000 mg | ORAL_TABLET | Freq: Four times a day (QID) | ORAL | Status: DC | PRN
  Administered 2013-05-17: 650 mg via ORAL
  Filled 2013-05-17: qty 2

## 2013-05-17 MED ORDER — ALBUTEROL SULFATE (5 MG/ML) 0.5% IN NEBU: 2.5000 mg | INHALATION_SOLUTION | Freq: Four times a day (QID) | RESPIRATORY_TRACT | Status: DC | PRN

## 2013-05-17 MED ORDER — IPRATROPIUM BROMIDE 0.02 % IN SOLN: 0.5000 mg | Freq: Four times a day (QID) | RESPIRATORY_TRACT | Status: DC | PRN

## 2013-05-17 MED ORDER — SODIUM CHLORIDE 0.9 % IV SOLN
INTRAVENOUS | Status: DC
  Administered 2013-05-17: 22:00:00 via INTRAVENOUS
  Filled 2013-05-17 (×6): qty 1000

## 2013-05-17 NOTE — Progress Notes (Signed)
Patient ID: [name] [name], male   DOB: 30-Jun-1934, 79 y.o.   MRN: [id number]    Patient was [id number] for scheduled thoracolumbar I&D and wound closure tomorrow.  I spoke with the PA-C from that unit and he was given medical clearance for transfer and surgery.  I asked hospitalist to consult to assist in medical management postop.  We will see patient in the AM.

## 2013-05-17 NOTE — Consult Note (Signed)
[facility] Medical Consultation  [name]:[id number] DOB: 10/10/1933 DOA: 05/17/2013 PCP: Provider Not In System   Requesting physician: Dr. Shon Baton- Ortho Date of consultation: 05/17/13 Reason for consultation: Medical management  Impression/Recommendations Principal Problem:   Osteomyelitis of lumbar spine Active Problems:   CAD (coronary artery disease)   DM2 (diabetes mellitus, type 2)   HTN (hypertension)   Hyperlipidemia   Protein-calorie malnutrition, severe   Benign hypertension    Osteomyelitis of lumbar spine: Secondary to Serratia Transferred from [facility] for wound closure completion and removal of his wound vac. Being treated with IV Cefepime by Infectious Disease (stop Date 05/21/13) Dr. Shon Baton is primary   Severe malnutrition: Nutrition consult  On glucerna daily, Prostat Has been on Marinol at [facility] Pre Albumin is 9  Cirrhosis with pulm. Vascular congestion with effusions Likely secondary to severe malnutrition,  low albumin Low dose lasix  T2DM: CBGs Stable SSI - Sensitive Will continue to monitor  BPH Currently with mild urinary retention Flomax Will monitor  VRE UTI Abdominal tenderness noted on exam Treated with Fosfomycin on 9/28, 9/25 Will request a 3rd dose of antibiotic therapy to complete treatment.  DVT: Superficial right basilic vein 05/03/13 Multiple bilateral lower extremity DVTs present 04/24/13 Has an IVC filter in place Will hold off on starting Heparin (at the direction of Ortho - will reassess after OR)  HTN Moderately controlled Lisinopril currently held Lasix.  CKD Stable Creatinine currently at baseline  CAD: Stable  On lipitor  I will followup again tomorrow. Please contact me if I can be of assistance in the meanwhile. Thank you for this consultation.  Chief Complaint: Pleasantly demented.  HPI [name] is a 79 year old male with a complex past history significant for  Osteomyelitis of the lumbar thoracic spine, diabetes, chronic kidney disease, recent DVT, hypertension, and arthritis. He is being transferred back to [facility] for wound closure by Dr. Venita Lick. Mr. Taillon underwent spinal fusion on March 16, 2013.  Unfortunately he developed infection surrounding his hardware.  Cultures revealed Serratia.  He is being treated with IV Cefepime (Stop Date 05/21/2013).  We are being asked to consult for general medical management.   Review of Systems: latest available to light febrile illness line of her do this along with other people Resnick is aCh     Past Medical History  Diagnosis Date  . Hypertension   . Chronic kidney disease     bph  . Arthritis   . Coronary artery disease   . High cholesterol   . Myocardial infarction 2002    "before his lungs or legs" (05/05/2013)  . DVT (deep venous thrombosis)     "after back OR in August" (05/05/2013)  . Type II diabetes mellitus   . History of blood transfusion     "some since OR in 03/2013" (05/05/2013)  . Chronic lower back pain   . Gout   . BPH (benign prostatic hypertrophy)   . Prostate cancer     "S/P radiation tx" ((05/05/2013)  . Skin cancer of face     "had several taken off his face; had his nose cut/reconstructed" (05/05/2013)   Past Surgical History  Procedure Laterality Date  . Appendectomy    . Coronary artery bypass graft  11/09/2000    LIMA to LAD, SVG to RPDA, seq SVG to OM2 and D1 by [name] [facility]  . Lumbar fusion  03/15/2013    Dr Shon Baton  . Posterior lumbar fusion 4  level N/A 03/16/2013    Procedure: T9 - L3 POSTERIOR POSTERIOR SPINAL FUSION ;  Surgeon: Venita Lick, MD;  Location: [facility] OR;  Service: Orthopedics;  Laterality: N/A;  . Hardware removal N/A 03/30/2013    Procedure: HARDWARE REMOVAL/IRRIGATION & DEBRIDEMENT OF WOUND ON BACK;  Surgeon: Venita Lick, MD;  Location: [facility] OR;  Service: Orthopedics;  Laterality: N/A;  . Incision and drainage of wound N/A  04/13/2013    Procedure: IRRIGATION AND DEBRIDEMENT WOUND AND VAC DRESSING;  Surgeon: Venita Lick, MD;  Location: [facility] OR;  Service: Orthopedics;  Laterality: N/A;  . Application of wound vac  04/13/2013; 05/04/2013  . Back surgery    . Cataract extraction w/ intraocular lens  implant, bilateral Bilateral   . Secondary closure of wound N/A 05/05/2013    Procedure: WASH-OUT CLOSURE OF WOUND PARTIAL;  Surgeon: Venita Lick, MD;  Location: [facility] OR;  Service: Orthopedics;  Laterality: N/A;   Social History:  reports that he has never smoked. He has never used smokeless tobacco. He reports that  drinks alcohol. He reports that he does not use illicit drugs.  No Known Allergies Family History  Problem Relation Age of Onset  . Diabetes Mother   . Stroke Brother     x2    Prior to Admission medications   Medication Sig Start Date End Date Taking? Authorizing Provider  acetaminophen (TYLENOL) 325 MG tablet Take 650 mg by mouth every 6 (six) hours as needed for pain.    Historical Provider, MD  acetaminophen (TYLENOL) 650 MG suppository Place 650 mg rectally every 8 (eight) hours as needed for fever.    Historical Provider, MD  antiseptic oral rinse (BIOTENE) LIQD 15 mLs by Mouth Rinse route 2 (two) times daily. 04/15/13   Simonne Martinet, NP  Ascorbic Acid (VITAMIN C) 1000 MG tablet Take 2,000 mg by mouth daily.    Historical Provider, MD  atorvastatin (LIPITOR) 10 MG tablet Take 10 mg by mouth daily.    Historical Provider, MD  brimonidine (ALPHAGAN) 0.2 % ophthalmic solution Place 1 drop into both eyes 2 (two) times daily.    Historical Provider, MD  Calcium Carbonate-Vitamin D (CALCIUM 600+D) 600-200 MG-UNIT TABS Take 1 tablet by mouth daily.    Historical Provider, MD  cholecalciferol (VITAMIN D) 1000 UNITS tablet Take 1,000 Units by mouth daily.    Historical Provider, MD  dextrose (GLUTOSE) 40 % GEL Take 1 Tube by mouth once as needed (for hypotension).    Historical Provider, MD  dextrose 5 %  SOLN 50 mL with ceFEPIme 2 G SOLR 2 g Inject 2 g into the vein every 12 (twelve) hours.    Historical Provider, MD  dextrose 50 % solution Inject 11-25 mLs into the vein as needed (as needed, sliding scale).    Historical Provider, MD  docusate sodium (COLACE) 100 MG capsule Take 1 capsule (100 mg total) by mouth 3 (three) times daily as needed for constipation. 03/19/13   Venita Lick, MD  dronabinol (MARINOL) 2.5 MG capsule Take 2.5 mg by mouth 3 (three) times daily.    Historical Provider, MD  famotidine (PEPCID) 20 MG tablet Take 20 mg by mouth daily.    Historical Provider, MD  feeding supplement (PRO-STAT SUGAR FREE 64) LIQD Take 30 mLs by mouth 2 (two) times daily.     Historical Provider, MD  furosemide (LASIX) 20 MG tablet Take 20 mg by mouth 2 (two) times daily.    Historical Provider, MD  glucagon (GLUCAGEN) 1  MG SOLR injection Inject 1 mg into the vein once as needed.    Historical Provider, MD  GLUCERNA (GLUCERNA) LIQD Take 237 mLs by mouth daily.    Historical Provider, MD  guaiFENesin (ROBITUSSIN) 100 MG/5ML liquid Take 200 mg by mouth 3 (three) times daily as needed for cough.    Historical Provider, MD  insulin aspart (NOVOLOG) 100 UNIT/ML injection Inject 0-15 Units into the skin 3 (three) times daily with meals. MODERATE SLIDING SCALE CBG 70-150: 0 units 151-200: 3 units 201-250: 5 units 251-300: 8 units 301-350: 11 units 351-400 15 units >400: CALL MD AND OBTAIN STAT LAB VERIFICATION    Historical Provider, MD  ipratropium-albuterol (DUONEB) 0.5-2.5 (3) MG/3ML SOLN Take 3 mLs by nebulization every 6 (six) hours as needed (for shortness of breath).    Historical Provider, MD  lactobacillus (FLORANEX/LACTINEX) PACK Take 1 g by mouth daily.    Historical Provider, MD  lisinopril (PRINIVIL,ZESTRIL) 5 MG tablet Take 5 mg by mouth daily.    Historical Provider, MD  methocarbamol (ROBAXIN) 500 MG tablet Take 1 tablet (500 mg total) by mouth 3 (three) times daily as needed. 03/19/13    Venita Lick, MD  methocarbamol (ROBAXIN) 500 MG tablet Take 500 mg by mouth 3 (three) times daily.    Historical Provider, MD  Multiple Vitamins-Minerals (MULTIVITAMIN WITH MINERALS) tablet Take 1 tablet by mouth daily.    Historical Provider, MD  ondansetron (ZOFRAN) 4 MG tablet Take 1 tablet (4 mg total) by mouth every 8 (eight) hours as needed for nausea. 03/19/13   Venita Lick, MD  oxycodone (OXY-IR) 5 MG capsule Take 5 mg by mouth every 6 (six) hours as needed for pain.     Historical Provider, MD  polyethylene glycol (MIRALAX / GLYCOLAX) packet Take 17 g by mouth daily. 04/15/13   Simonne Martinet, NP  potassium chloride (KLOR-CON) 20 MEQ packet Take 20 mEq by mouth 2 (two) times daily.    Historical Provider, MD  sertraline (ZOLOFT) 50 MG tablet Take 50 mg by mouth at bedtime.    Historical Provider, MD  sodium chloride (OCEAN) 0.65 % nasal spray Place 1 spray into the nose as needed for congestion.    Historical Provider, MD  tamsulosin (FLOMAX) 0.4 MG CAPS Take 0.4 mg by mouth daily.    Historical Provider, MD  zinc sulfate 220 MG capsule Take 220 mg by mouth daily.    Historical Provider, MD   Physical Exam: Blood pressure 150/58, pulse 97, temperature 98.5 F (36.9 C), resp. rate 20, SpO2 97.00%. Filed Vitals:   05/17/13 1500  BP: 150/58  Pulse: 97  Temp: 98.5 F (36.9 C)  Resp: 20     General:  Pale appearing, resting comfortably in bed  Eyes: EOM intact, PERRLA  Neck: Supple, no palpable masses or lymphadenopathy  Cardiovascular: Regular Rate and rhythm w/out M/G/R  Respiratory: Rales heard in lateral lung fields, good air movement noted  Abdomen: Soft, suprapubic tenderness, no significant distention, no rebound, guarding or organomegaly noted.  Skin: Dry, decreased turgor  Musculoskeletal: Wound drain securely in place on thoracolumbar spine, patient able to move legs, feet, arms, hands without difficulty.    Psychiatric: Mildly confused.  Awake, alert to time  and place. Pt. Did not know his age, or date.  Neurologic: No focal deficit.   Labs on Admission:  Basic Metabolic Panel:  Recent Labs Lab 05/11/13 0500 05/13/13 0830 05/15/13 0640 05/17/13 0810  NA 139 142 143 142  K 3.7 3.8  3.5 3.1*  CL 110 113* 114* 112  CO2 21 20 22 20   GLUCOSE 79 88 92 87  BUN 8 12 12 8   CREATININE 0.44* 0.57 0.45* 0.40*  CALCIUM 7.9* 8.2* 8.1* 8.1*  MG  --   --  1.8  --   PHOS  --   --  2.0*  --    CBC:  Recent Labs Lab 05/11/13 0500 05/13/13 0830 05/15/13 0640 05/17/13 0810  WBC 6.2 9.3 6.0 11.7*  HGB 8.8* 8.7* 9.4* 9.7*  HCT 27.6* 27.5* 28.6* 29.8*  MCV 91.7 93.2 91.1 89.8  PLT 132* 126* 153 151    Radiological Exams on Admission: Dg Chest Port 1 View  05/17/2013   CLINICAL DATA:  Shortness of breath.  EXAM: PORTABLE CHEST - 1 VIEW  COMPARISON:  09/17 and 04/27/2013 and 04/11/2013  FINDINGS: There is persistent cardiomegaly with slight pulmonary vascular congestion. Bibasilar effusions and atelectasis are slightly improved. No acute osseous abnormality.  IMPRESSION: Slight improvement in the vascular congestion and bibasilar atelectasis and effusions.   Electronically Signed   By: Geanie Cooley   On: 05/17/2013 16:12     Time spent: [address] Jennifer PA-S2 Rob Bunting PA-S2 [place] [facility] Pager [phone]  If 7PM-7AM, please contact night-coverage [email or web address] Password [facility] 05/17/2013, 3:39 PM

## 2013-05-17 NOTE — Consult Note (Signed)
  I have directly reviewed the clinical findings, lab, imaging studies and management of this patient in detail. I have interviewed and examined the patient and agree with the documentation,  as recorded by the Physician extender.    Please consider ID consult and full anticoagulation when appropriate for Upper and lower Extremity clots.   Coreg added with PRN Hydralazine for HTN, titrtae Lasix up gradually as has Pl effusions from 3rd spacing due to PCM (low protein) no CHF Echo shows Ef 55% no LVH.    Leroy Sea M.D on 05/17/2013 at 4:48 PM  [facility] Office  [phone]

## 2013-05-17 NOTE — Progress Notes (Signed)
ANTIBIOTIC CONSULT NOTE - INITIAL  Pharmacy Consult for cefepime Indication: lumbo-sacral osteo  No Known Allergies  Patient Measurements:   Weight: per [facility] to [facility] on 05/17/13 = 167.4 pounds = 76 kg  Vital Signs: Temp: 98.5 F (36.9 C) (09/30 1500) BP: 150/58 mmHg (09/30 1500) Pulse Rate: 97 (09/30 1500) Intake/Output from previous day:   Intake/Output from this shift:    Labs:  Recent Labs  05/15/13 0640 05/17/13 0810  WBC 6.0 11.7*  HGB 9.4* 9.7*  PLT 153 151  CREATININE 0.45* 0.40*   The CrCl is unknown because both a height and weight (above a minimum accepted value) are required for this calculation. No results found for this basename: Rolm Gala, VANCORANDOM, GENTTROUGH, GENTPEAK, GENTRANDOM, TOBRATROUGH, TOBRAPEAK, TOBRARND, AMIKACINPEAK, AMIKACINTROU, AMIKACIN,  in the last 72 hours   Microbiology: Recent Results (from the past 720 hour(s))  URINE CULTURE     Status: None   Collection Time    04/25/13 11:00 AM      Result Value Range Status   Specimen Description URINE, RANDOM   Final   Special Requests Normal   Final   Culture  Setup Time     Final   Value: 04/26/2013 03:03     Performed at [facility] Count     Final   Value: NO GROWTH     Performed at [facility]   Culture     Final   Value: NO GROWTH     Performed at [facility]   Report Status 04/27/2013 FINAL   Final  SURGICAL PCR SCREEN     Status: Abnormal   Collection Time    05/05/13 12:09 AM      Result Value Range Status   MRSA, PCR POSITIVE (*) NEGATIVE Final   Comment: RESULT CALLED TO, READ BACK BY AND VERIFIED WITH:     REBECCA SPARKS RN [phone] 0300 GREEN R   Staphylococcus aureus POSITIVE (*) NEGATIVE Final   Comment:            The Xpert SA Assay (FDA     approved for NASAL specimens     in patients over 50 years of age),     is one component of     a comprehensive surveillance     program.  Test performance has     been  validated by [employer] for patients greater     than or equal to 71 year old.     It is not intended     to diagnose infection nor to     guide or monitor treatment.     RESULT CALLED TO, READ BACK BY AND VERIFIED WITH:     REBECCA SPARKS RN [phone] 0300 GREEN R  URINE CULTURE     Status: None   Collection Time    05/08/13 11:13 PM      Result Value Range Status   Specimen Description URINE, RANDOM   Final   Special Requests NONE   Final   Culture  Setup Time 05/08/2013 23:57   Final   Colony Count >=100,000 COLONIES/ML   Final   Culture VANCOMYCIN RESISTANT ENTEROCOCCUS ISOLATED   Final   Report Status 05/12/2013 FINAL   Final   Organism ID, Bacteria VANCOMYCIN RESISTANT ENTEROCOCCUS ISOLATED   Final    Medical History: Past Medical History  Diagnosis Date  . Hypertension   . Chronic kidney disease  bph  . Arthritis   . Coronary artery disease   . High cholesterol   . Myocardial infarction 2002    "before his lungs or legs" (05/05/2013)  . DVT (deep venous thrombosis)     "after back OR in August" (05/05/2013)  . Type II diabetes mellitus   . History of blood transfusion     "some since OR in 03/2013" (05/05/2013)  . Chronic lower back pain   . Gout   . BPH (benign prostatic hypertrophy)   . Prostate cancer     "S/P radiation tx" ((05/05/2013)  . Skin cancer of face     "had several taken off his face; had his nose cut/reconstructed" (05/05/2013)    Medications:  Prescriptions prior to admission  Medication Sig Dispense Refill  . acetaminophen (TYLENOL) 325 MG tablet Take 650 mg by mouth every 6 (six) hours as needed for pain.      [name] acetaminophen (TYLENOL) 650 MG suppository Place 650 mg rectally every 8 (eight) hours as needed for fever.      . Ascorbic Acid (VITAMIN C) 1000 MG tablet Take 2,000 mg by mouth daily.      [name] atorvastatin (LIPITOR) 10 MG tablet Take 10 mg by mouth daily.      . furosemide (LASIX) 20 MG tablet Take 20 mg by mouth 2 (two) times  daily.      . insulin aspart (NOVOLOG) 100 UNIT/ML injection Inject 0-15 Units into the skin 3 (three) times daily with meals. MODERATE SLIDING SCALE CBG 70-150: 0 units 151-200: 3 units 201-250: 5 units 251-300: 8 units 301-350: 11 units 351-400 15 units >400: CALL MD AND OBTAIN STAT LAB VERIFICATION      . lactobacillus (FLORANEX/LACTINEX) PACK Take 1 g by mouth daily.      [name] lisinopril (PRINIVIL,ZESTRIL) 5 MG tablet Take 5 mg by mouth daily.      . methocarbamol (ROBAXIN) 500 MG tablet Take 500 mg by mouth 3 (three) times daily.      . Multiple Vitamins-Minerals (MULTIVITAMIN WITH MINERALS) tablet Take 1 tablet by mouth daily.      . ondansetron (ZOFRAN) 4 MG tablet Take 1 tablet (4 mg total) by mouth every 8 (eight) hours as needed for nausea.  20 tablet  0  . oxycodone (OXY-IR) 5 MG capsule Take 5 mg by mouth every 6 (six) hours as needed for pain.       . polyethylene glycol (MIRALAX / GLYCOLAX) packet Take 17 g by mouth daily.  14 each  0  . potassium chloride (KLOR-CON) 20 MEQ packet Take 20 mEq by mouth 2 (two) times daily.      . sertraline (ZOLOFT) 50 MG tablet Take 50 mg by mouth at bedtime.      . sodium chloride (OCEAN) 0.65 % nasal spray Place 1 spray into the nose as needed for congestion.      . tamsulosin (FLOMAX) 0.4 MG CAPS Take 0.4 mg by mouth daily.      [name] zinc sulfate 220 MG capsule Take 220 mg by mouth daily.      [name] antiseptic oral rinse (BIOTENE) LIQD 15 mLs by Mouth Rinse route 2 (two) times daily.      . brimonidine (ALPHAGAN) 0.2 % ophthalmic solution Place 1 drop into both eyes 2 (two) times daily.      . Calcium Carbonate-Vitamin D (CALCIUM 600+D) 600-200 MG-UNIT TABS Take 1 tablet by mouth daily.      . cholecalciferol (VITAMIN D) 1000 UNITS  tablet Take 1,000 Units by mouth daily.      [name] dextrose (GLUTOSE) 40 % GEL Take 1 Tube by mouth once as needed (for hypotension).      [name] dextrose 5 % SOLN 50 mL with ceFEPIme 2 G SOLR 2 g Inject 2 g into the vein every 12  (twelve) hours.      [name] dextrose 50 % solution Inject 11-25 mLs into the vein as needed (as needed, sliding scale).      [name] docusate sodium (COLACE) 100 MG capsule Take 1 capsule (100 mg total) by mouth 3 (three) times daily as needed for constipation.  30 capsule  0  . dronabinol (MARINOL) 2.5 MG capsule Take 2.5 mg by mouth 3 (three) times daily.      . famotidine (PEPCID) 20 MG tablet Take 20 mg by mouth daily.      . feeding supplement (PRO-STAT SUGAR FREE 64) LIQD Take 30 mLs by mouth 2 (two) times daily.       [name] glucagon (GLUCAGEN) 1 MG SOLR injection Inject 1 mg into the vein once as needed.      [name] GLUCERNA (GLUCERNA) LIQD Take 237 mLs by mouth daily.      [name] guaiFENesin (ROBITUSSIN) 100 MG/5ML liquid Take 200 mg by mouth 3 (three) times daily as needed for cough.      [name] ipratropium-albuterol (DUONEB) 0.5-2.5 (3) MG/3ML SOLN Take 3 mLs by nebulization every 6 (six) hours as needed (for shortness of breath).       Assessment: 79 yo M transferred to [facility] from [facility] for wound closure completion and removal of his wound van.  He is to continue cefepime for osteomyelitis of lumbar spine (stop date 05/21/13).    WT 76 kg.   Osteomyelitis of the lumbar thoracic spine, diabetes, chronic kidney disease, recent DVT, hypertension, and arthritis. He is being transferred back to [facility] for wound closure by Dr. Venita Lick. [name] [name] underwent spinal fusion on March 16, 2013. Unfortunately he developed infection surrounding his hardware. Cultures revealed Serratia. He is being treated with IV Cefepime (Stop Date 05/21/2013).    Goal of Therapy:  Eradicate infection  Plan:  1. Cefepime 2 gm IV q12 - planned stop date is 05/21/13.  2. F/u renal function, wbc, temp, culture data, clinical course and orthopedic surgery plans. Thanks Herby Abraham, Pharm.D. [phone] 05/17/2013 4:42 PM

## 2013-05-18 ENCOUNTER — Encounter (HOSPITAL_COMMUNITY): Payer: Self-pay | Admitting: Critical Care Medicine

## 2013-05-18 ENCOUNTER — Encounter (HOSPITAL_COMMUNITY): Payer: Medicare Other

## 2013-05-18 ENCOUNTER — Encounter (HOSPITAL_COMMUNITY): Payer: Self-pay | Admitting: General Practice

## 2013-05-18 ENCOUNTER — Ambulatory Visit: Payer: Medicare Other | Admitting: Vascular Surgery

## 2013-05-18 ENCOUNTER — Encounter (HOSPITAL_COMMUNITY)
Admission: AD | Disposition: A | Discharge status: Skilled Nursing Facility | Payer: Self-pay | Source: Other Acute Inpatient Hospital | Attending: Orthopedic Surgery

## 2013-05-18 ENCOUNTER — Inpatient Hospital Stay (HOSPITAL_COMMUNITY): Payer: Medicare Other | Admitting: Critical Care Medicine

## 2013-05-18 DIAGNOSIS — E876 Hypokalemia: Secondary | ICD-10-CM

## 2013-05-18 HISTORY — PX: INCISION AND DRAINAGE OF WOUND: SHX1803

## 2013-05-18 LAB — CBC
Hemoglobin: 9.8 g/dL — ABNORMAL LOW (ref 13.0–17.0)
MCH: 29.5 pg (ref 26.0–34.0)
MCHC: 33 g/dL (ref 30.0–36.0)
MCV: 89.5 fL (ref 78.0–100.0)
Platelets: 144 10*3/uL — ABNORMAL LOW (ref 150–400)
RDW: 18.4 % — ABNORMAL HIGH (ref 11.5–15.5)

## 2013-05-18 LAB — GLUCOSE, CAPILLARY
Glucose-Capillary: 95 mg/dL (ref 70–99)
Glucose-Capillary: 98 mg/dL (ref 70–99)
Glucose-Capillary: 99 mg/dL (ref 70–99)
Glucose-Capillary: 96 mg/dL (ref 70–99)
Glucose-Capillary: 107 mg/dL — ABNORMAL HIGH (ref 70–99)

## 2013-05-18 LAB — COMPREHENSIVE METABOLIC PANEL
ALT: 13 U/L (ref 0–53)
AST: 14 U/L (ref 0–37)
Albumin: 1.9 g/dL — ABNORMAL LOW (ref 3.5–5.2)
Alkaline Phosphatase: 61 U/L (ref 39–117)
BUN: 10 mg/dL (ref 6–23)
CO2: 21 mEq/L (ref 19–32)
Calcium: 8.1 mg/dL — ABNORMAL LOW (ref 8.4–10.5)
Chloride: 114 mEq/L — ABNORMAL HIGH (ref 96–112)
Creatinine, Ser: 0.48 mg/dL — ABNORMAL LOW (ref 0.50–1.35)
GFR calc Af Amer: >90 mL/min (ref >90)
GFR calc non Af Amer: >90 mL/min (ref >90)
Glucose, Bld: 99 mg/dL (ref 70–99)
Potassium: 3.2 mEq/L — ABNORMAL LOW (ref 3.5–5.1)
Sodium: 144 mEq/L (ref 135–145)
Total Bilirubin: 1.2 mg/dL (ref 0.3–1.2)
Total Protein: 5.6 g/dL — ABNORMAL LOW (ref 6.0–8.3)

## 2013-05-18 SURGERY — IRRIGATION AND DEBRIDEMENT WOUND
Anesthesia: General | Site: Back | Wound class: Clean

## 2013-05-18 MED ORDER — SODIUM CHLORIDE 0.9 % IV SOLN
INTRAVENOUS | Status: DC
  Filled 2013-05-18 (×4): qty 1000

## 2013-05-18 MED ORDER — DOCUSATE SODIUM 100 MG PO CAPS
100.0000 mg | ORAL_CAPSULE | Freq: Two times a day (BID) | ORAL | Status: DC
  Administered 2013-05-18 – 2013-05-19 (×2): 100 mg via ORAL
  Filled 2013-05-18 (×13): qty 1

## 2013-05-18 MED ORDER — CEFAZOLIN SODIUM-DEXTROSE 2-3 GM-% IV SOLR
INTRAVENOUS | Status: AC
  Filled 2013-05-18: qty 50

## 2013-05-18 MED ORDER — 0.9 % SODIUM CHLORIDE (POUR BTL) OPTIME
TOPICAL | Status: DC | PRN
  Administered 2013-05-18: 1000 mL

## 2013-05-18 MED ORDER — HYDROMORPHONE HCL PF 1 MG/ML IJ SOLN
0.2500 mg | INTRAMUSCULAR | Status: DC | PRN
  Administered 2013-05-18 (×2): 0.5 mg via INTRAVENOUS

## 2013-05-18 MED ORDER — METOCLOPRAMIDE HCL 5 MG/ML IJ SOLN: 10.0000 mg | Freq: Once | INTRAMUSCULAR | Status: DC | PRN

## 2013-05-18 MED ORDER — OXYCODONE HCL 5 MG/5ML PO SOLN: 5.0000 mg | Freq: Once | ORAL | Status: DC | PRN

## 2013-05-18 MED ORDER — NEOSTIGMINE METHYLSULFATE 1 MG/ML IJ SOLN
INTRAMUSCULAR | Status: DC | PRN
  Administered 2013-05-18: 2 mg via INTRAVENOUS

## 2013-05-18 MED ORDER — FENTANYL CITRATE 0.05 MG/ML IJ SOLN
INTRAMUSCULAR | Status: DC | PRN
  Administered 2013-05-18 (×2): 25 ug via INTRAVENOUS

## 2013-05-18 MED ORDER — ONDANSETRON HCL 4 MG/2ML IJ SOLN: 4.0000 mg | Freq: Four times a day (QID) | INTRAMUSCULAR | Status: DC | PRN

## 2013-05-18 MED ORDER — METOCLOPRAMIDE HCL 5 MG/ML IJ SOLN: 5.0000 mg | Freq: Three times a day (TID) | INTRAMUSCULAR | Status: DC | PRN

## 2013-05-18 MED ORDER — LACTATED RINGERS IV SOLN: INTRAVENOUS | Status: DC

## 2013-05-18 MED ORDER — ONDANSETRON HCL 4 MG/2ML IJ SOLN
INTRAMUSCULAR | Status: DC | PRN
  Administered 2013-05-18: 4 mg via INTRAVENOUS

## 2013-05-18 MED ORDER — ENSURE COMPLETE PO LIQD
237.0000 mL | Freq: Two times a day (BID) | ORAL | Status: DC
  Administered 2013-05-19: 237 mL via ORAL

## 2013-05-18 MED ORDER — SODIUM CHLORIDE 0.9 % IV SOLN
INTRAVENOUS | Status: DC
  Administered 2013-05-19: 17:00:00 via INTRAVENOUS

## 2013-05-18 MED ORDER — ETOMIDATE 2 MG/ML IV SOLN
INTRAVENOUS | Status: DC | PRN
  Administered 2013-05-18: 10 mg via INTRAVENOUS

## 2013-05-18 MED ORDER — ONDANSETRON HCL 4 MG PO TABS: 4.0000 mg | ORAL_TABLET | Freq: Four times a day (QID) | ORAL | Status: DC | PRN

## 2013-05-18 MED ORDER — OXYCODONE HCL 5 MG PO TABS: 5.0000 mg | ORAL_TABLET | Freq: Once | ORAL | Status: DC | PRN

## 2013-05-18 MED ORDER — LACTATED RINGERS IV SOLN
INTRAVENOUS | Status: DC | PRN
  Administered 2013-05-18: 15:00:00 via INTRAVENOUS

## 2013-05-18 MED ORDER — LACTATED RINGERS IV SOLN
INTRAVENOUS | Status: DC
  Administered 2013-05-18: 14:00:00 via INTRAVENOUS

## 2013-05-18 MED ORDER — GLYCOPYRROLATE 0.2 MG/ML IJ SOLN
INTRAMUSCULAR | Status: DC | PRN
  Administered 2013-05-18: 0.3 mg via INTRAVENOUS

## 2013-05-18 MED ORDER — METOCLOPRAMIDE HCL 10 MG PO TABS: 5.0000 mg | ORAL_TABLET | Freq: Three times a day (TID) | ORAL | Status: DC | PRN

## 2013-05-18 MED ORDER — ROCURONIUM BROMIDE 100 MG/10ML IV SOLN
INTRAVENOUS | Status: DC | PRN
  Administered 2013-05-18: 25 mg via INTRAVENOUS

## 2013-05-18 MED ORDER — PHENYLEPHRINE HCL 10 MG/ML IJ SOLN
10.0000 mg | INTRAVENOUS | Status: DC | PRN
  Administered 2013-05-18: 50 ug/min via INTRAVENOUS

## 2013-05-18 MED ORDER — CEFAZOLIN SODIUM-DEXTROSE 2-3 GM-% IV SOLR
INTRAVENOUS | Status: DC | PRN
  Administered 2013-05-18: 2 g via INTRAVENOUS

## 2013-05-18 MED ORDER — HYDROMORPHONE HCL PF 1 MG/ML IJ SOLN
INTRAMUSCULAR | Status: AC
  Filled 2013-05-18: qty 1

## 2013-05-18 SURGICAL SUPPLY — 58 items
BUR EGG ELITE 4.0 (BURR) IMPLANT
CANISTER SUCTION 2500CC (MISCELLANEOUS) ×2 IMPLANT
CLOTH BEACON ORANGE TIMEOUT ST (SAFETY) ×2 IMPLANT
CORDS BIPOLAR (ELECTRODE) ×2 IMPLANT
COVER SURGICAL LIGHT HANDLE (MISCELLANEOUS) ×2 IMPLANT
DRAPE ORTHO SPLIT 77X108 STRL (DRAPES) ×2
DRAPE POUCH INSTRU U-SHP 10X18 (DRAPES) ×2 IMPLANT
DRAPE PROXIMA HALF (DRAPES) ×2 IMPLANT
DRAPE SURG 17X23 STRL (DRAPES) ×2 IMPLANT
DRAPE SURG ORHT 6 SPLT 77X108 (DRAPES) ×2 IMPLANT
DRAPE U-SHAPE 47X51 STRL (DRAPES) ×2 IMPLANT
DRSG ADAPTIC 3X8 NADH LF (GAUZE/BANDAGES/DRESSINGS) ×2 IMPLANT
DRSG MEPILEX BORDER 4X12 (GAUZE/BANDAGES/DRESSINGS) ×4 IMPLANT
DRSG MEPILEX BORDER 4X8 (GAUZE/BANDAGES/DRESSINGS) IMPLANT
DURAPREP 26ML APPLICATOR (WOUND CARE) ×2 IMPLANT
ELECT BLADE 4.0 EZ CLEAN MEGAD (MISCELLANEOUS)
ELECT CAUTERY BLADE 6.4 (BLADE) ×2 IMPLANT
ELECT REM PT RETURN 9FT ADLT (ELECTROSURGICAL)
ELECTRODE BLDE 4.0 EZ CLN MEGD (MISCELLANEOUS) IMPLANT
ELECTRODE REM PT RTRN 9FT ADLT (ELECTROSURGICAL) IMPLANT
EVACUATOR 1/8 PVC DRAIN (DRAIN) IMPLANT
GLOVE BIOGEL PI IND STRL 8 (GLOVE) ×1 IMPLANT
GLOVE BIOGEL PI IND STRL 8.5 (GLOVE) ×1 IMPLANT
GLOVE BIOGEL PI INDICATOR 8 (GLOVE) ×1
GLOVE BIOGEL PI INDICATOR 8.5 (GLOVE) ×1
GLOVE ECLIPSE 8.5 STRL (GLOVE) ×2 IMPLANT
GLOVE ORTHO TXT STRL SZ7.5 (GLOVE) IMPLANT
GOWN PREVENTION PLUS XXLARGE (GOWN DISPOSABLE) ×2 IMPLANT
GOWN STRL NON-REIN LRG LVL3 (GOWN DISPOSABLE) ×2 IMPLANT
GOWN STRL REIN 2XL XLG LVL4 (GOWN DISPOSABLE) ×2 IMPLANT
KIT BASIN OR (CUSTOM PROCEDURE TRAY) ×2 IMPLANT
KIT ROOM TURNOVER OR (KITS) ×2 IMPLANT
NEEDLE 22X1 1/2 (OR ONLY) (NEEDLE) IMPLANT
NEEDLE SPNL 18GX3.5 QUINCKE PK (NEEDLE) IMPLANT
NS IRRIG 1000ML POUR BTL (IV SOLUTION) ×4 IMPLANT
PACK GENERAL/GYN (CUSTOM PROCEDURE TRAY) ×2 IMPLANT
PACK LAMINECTOMY ORTHO (CUSTOM PROCEDURE TRAY) IMPLANT
PACK UNIVERSAL I (CUSTOM PROCEDURE TRAY) ×2 IMPLANT
PAD ARMBOARD 7.5X6 YLW CONV (MISCELLANEOUS) ×4 IMPLANT
PATTIES SURGICAL .5 X.5 (GAUZE/BANDAGES/DRESSINGS) IMPLANT
PATTIES SURGICAL .5 X1 (DISPOSABLE) IMPLANT
SPONGE SURGIFOAM ABS GEL 100 (HEMOSTASIS) IMPLANT
STRIP CLOSURE SKIN 1/2X4 (GAUZE/BANDAGES/DRESSINGS) IMPLANT
SURGIFLO TRUKIT (HEMOSTASIS) IMPLANT
SUT MON AB 3-0 SH 27 (SUTURE) ×1
SUT MON AB 3-0 SH27 (SUTURE) ×1 IMPLANT
SUT PDS AB 1 CTX 36 (SUTURE) ×4 IMPLANT
SUT VIC AB 0 CT1 27 (SUTURE) ×1
SUT VIC AB 0 CT1 27XBRD ANBCTR (SUTURE) ×1 IMPLANT
SUT VIC AB 1 CTX 36 (SUTURE) ×2
SUT VIC AB 1 CTX36XBRD ANBCTR (SUTURE) ×2 IMPLANT
SUT VIC AB 2-0 CT1 18 (SUTURE) ×2 IMPLANT
SYR BULB IRRIGATION 50ML (SYRINGE) IMPLANT
SYR CONTROL 10ML LL (SYRINGE) IMPLANT
TOWEL OR 17X24 6PK STRL BLUE (TOWEL DISPOSABLE) ×2 IMPLANT
TOWEL OR 17X26 10 PK STRL BLUE (TOWEL DISPOSABLE) ×4 IMPLANT
WATER STERILE IRR 1000ML POUR (IV SOLUTION) IMPLANT
YANKAUER SUCT BULB TIP NO VENT (SUCTIONS) ×2 IMPLANT

## 2013-05-18 NOTE — Progress Notes (Signed)
Called OR nurse to explain about inability to obtain consent form, due to patient being alert only to self and location. Disoriented to time and situation. Family members have been called, both daughter and son multiple times with no answer.

## 2013-05-18 NOTE — H&P (Signed)
[name] [name] is an 79 y.o. male.   Chief Complaint:  Delayed back wound closure.  Postop infection HPI:   79 yo wm with hx of multiple thoracolumbar surgeries due to failed hardware and postop wound infection.  [facility] debridement and closure.  Doing well.  Has been given medical clearance by [facility].    Past Medical History  Diagnosis Date  . Hypertension   . Chronic kidney disease     bph  . Arthritis   . Coronary artery disease   . High cholesterol   . Myocardial infarction 2002    "before his lungs or legs" (05/05/2013)  . DVT (deep venous thrombosis)     "after back OR in August" (05/05/2013)  . Type II diabetes mellitus   . History of blood transfusion     "some since OR in 03/2013" (05/05/2013)  . Chronic lower back pain   . Gout   . BPH (benign prostatic hypertrophy)   . Prostate cancer     "S/P radiation tx" ((05/05/2013)  . Skin cancer of face     "had several taken off his face; had his nose cut/reconstructed" (05/05/2013)  . Shortness of breath     Past Surgical History  Procedure Laterality Date  . Appendectomy    . Coronary artery bypass graft  11/09/2000    LIMA to LAD, SVG to RPDA, seq SVG to OM2 and D1 by [name] [facility]  . Lumbar fusion  03/15/2013    Dr Shon Baton  . Posterior lumbar fusion 4 level N/A 03/16/2013    Procedure: T9 - L3 POSTERIOR POSTERIOR SPINAL FUSION ;  Surgeon: Venita Lick, MD;  Location: [facility] OR;  Service: Orthopedics;  Laterality: N/A;  . Hardware removal N/A 03/30/2013    Procedure: HARDWARE REMOVAL/IRRIGATION & DEBRIDEMENT OF WOUND ON BACK;  Surgeon: Venita Lick, MD;  Location: [facility] OR;  Service: Orthopedics;  Laterality: N/A;  . Incision and drainage of wound N/A 04/13/2013    Procedure: IRRIGATION AND DEBRIDEMENT WOUND AND VAC DRESSING;  Surgeon: Venita Lick, MD;  Location: [facility] OR;  Service: Orthopedics;  Laterality: N/A;  . Application of wound vac  04/13/2013;  05/04/2013  . Back surgery    . Cataract extraction w/ intraocular lens  implant, bilateral Bilateral   . Secondary closure of wound N/A 05/05/2013    Procedure: WASH-OUT CLOSURE OF WOUND PARTIAL;  Surgeon: Venita Lick, MD;  Location: [facility];  Service: Orthopedics;  Laterality: N/A;    Family History  Problem Relation Age of Onset  . Diabetes Mother   . Stroke Brother     x2   Social History:  reports that he has never smoked. He has never used smokeless tobacco. He reports that  drinks alcohol. He reports that he does not use illicit drugs.  Allergies: No Known Allergies  Medications Prior to Admission  Medication Sig Dispense Refill  . acetaminophen (TYLENOL) 325 MG tablet Take 650 mg by mouth every 6 (six) hours as needed for pain.      [name] acetaminophen (TYLENOL) 650 MG suppository Place 650 mg rectally every 8 (eight) hours as needed for fever.      . Ascorbic Acid (VITAMIN C) 1000 MG tablet Take 2,000 mg by mouth daily.      [name] atorvastatin (LIPITOR) 10 MG tablet Take 10 mg by mouth daily.      . furosemide (LASIX) 20 MG tablet Take 20 mg by mouth 2 (two) times  daily.      . insulin aspart (NOVOLOG) 100 UNIT/ML injection Inject 0-15 Units into the skin 3 (three) times daily with meals. MODERATE SLIDING SCALE CBG 70-150: 0 units 151-200: 3 units 201-250: 5 units 251-300: 8 units 301-350: 11 units 351-400 15 units >400: CALL MD AND OBTAIN STAT LAB VERIFICATION      . lactobacillus (FLORANEX/LACTINEX) PACK Take 1 g by mouth daily.      [name] lisinopril (PRINIVIL,ZESTRIL) 5 MG tablet Take 5 mg by mouth daily.      . methocarbamol (ROBAXIN) 500 MG tablet Take 500 mg by mouth 3 (three) times daily.      . Multiple Vitamins-Minerals (MULTIVITAMIN WITH MINERALS) tablet Take 1 tablet by mouth daily.      . ondansetron (ZOFRAN) 4 MG tablet Take 1 tablet (4 mg total) by mouth every 8 (eight) hours as needed for nausea.  20 tablet  0  . oxycodone (OXY-IR) 5 MG capsule Take 5 mg by mouth every  6 (six) hours as needed for pain.       . polyethylene glycol (MIRALAX / GLYCOLAX) packet Take 17 g by mouth daily.  14 each  0  . potassium chloride (KLOR-CON) 20 MEQ packet Take 20 mEq by mouth 2 (two) times daily.      . sertraline (ZOLOFT) 50 MG tablet Take 50 mg by mouth at bedtime.      . sodium chloride (OCEAN) 0.65 % nasal spray Place 1 spray into the nose as needed for congestion.      . tamsulosin (FLOMAX) 0.4 MG CAPS Take 0.4 mg by mouth daily.      [name] zinc sulfate 220 MG capsule Take 220 mg by mouth daily.      [name] antiseptic oral rinse (BIOTENE) LIQD 15 mLs by Mouth Rinse route 2 (two) times daily.      . brimonidine (ALPHAGAN) 0.2 % ophthalmic solution Place 1 drop into both eyes 2 (two) times daily.      . Calcium Carbonate-Vitamin D (CALCIUM 600+D) 600-200 MG-UNIT TABS Take 1 tablet by mouth daily.      . cholecalciferol (VITAMIN D) 1000 UNITS tablet Take 1,000 Units by mouth daily.      [name] dextrose (GLUTOSE) 40 % GEL Take 1 Tube by mouth once as needed (for hypotension).      [name] dextrose 5 % SOLN 50 mL with ceFEPIme 2 G SOLR 2 g Inject 2 g into the vein every 12 (twelve) hours.      [name] dextrose 50 % solution Inject 11-25 mLs into the vein as needed (as needed, sliding scale).      [name] docusate sodium (COLACE) 100 MG capsule Take 1 capsule (100 mg total) by mouth 3 (three) times daily as needed for constipation.  30 capsule  0  . dronabinol (MARINOL) 2.5 MG capsule Take 2.5 mg by mouth 3 (three) times daily.      . famotidine (PEPCID) 20 MG tablet Take 20 mg by mouth daily.      . feeding supplement (PRO-STAT SUGAR FREE 64) LIQD Take 30 mLs by mouth 2 (two) times daily.       [name] glucagon (GLUCAGEN) 1 MG SOLR injection Inject 1 mg into the vein once as needed.      [name] GLUCERNA (GLUCERNA) LIQD Take 237 mLs by mouth daily.      [name] guaiFENesin (ROBITUSSIN) 100 MG/5ML liquid Take 200 mg by mouth 3 (three) times daily as needed for cough.      [name]  ipratropium-albuterol (DUONEB) 0.5-2.5 (3) MG/3ML  SOLN Take 3 mLs by nebulization every 6 (six) hours as needed (for shortness of breath).        Results for orders placed during the hospital encounter of 05/17/13 (from the past 48 hour(s))  GLUCOSE, CAPILLARY     Status: Abnormal   Collection Time    05/17/13  4:02 PM      Result Value Range   Glucose-Capillary 116 (*) 70 - 99 mg/dL  COMPREHENSIVE METABOLIC PANEL     Status: Abnormal   Collection Time    05/17/13  4:33 PM      Result Value Range   Sodium 145  135 - 145 mEq/L   Potassium 4.3  3.5 - 5.1 mEq/L   Comment: NO VISIBLE HEMOLYSIS   Chloride 115 (*) 96 - 112 mEq/L   CO2 22  19 - 32 mEq/L   Glucose, Bld 113 (*) 70 - 99 mg/dL   BUN 10  6 - 23 mg/dL   Creatinine, Ser 1.61  0.50 - 1.35 mg/dL   Calcium 8.2 (*) 8.4 - 10.5 mg/dL   Total Protein 5.3 (*) 6.0 - 8.3 g/dL   Albumin 1.8 (*) 3.5 - 5.2 g/dL   AST 12  0 - 37 U/L   ALT 12  0 - 53 U/L   Alkaline Phosphatase 58  39 - 117 U/L   Total Bilirubin 1.0  0.3 - 1.2 mg/dL   GFR calc non Af Amer >90  >90 mL/min   GFR calc Af Amer >90  >90 mL/min   Comment: (NOTE)     The eGFR has been calculated using the CKD EPI equation.     This calculation has not been validated in all clinical situations.     eGFR's persistently <90 mL/min signify possible Chronic Kidney     Disease.  CBC WITH DIFFERENTIAL     Status: Abnormal   Collection Time    05/17/13  4:33 PM      Result Value Range   WBC 9.9  4.0 - 10.5 K/uL   RBC 3.12 (*) 4.22 - 5.81 MIL/uL   Hemoglobin 9.2 (*) 13.0 - 17.0 g/dL   HCT 09.6 (*) 04.5 - 40.9 %   MCV 90.1  78.0 - 100.0 fL   MCH 29.5  26.0 - 34.0 pg   MCHC 32.7  30.0 - 36.0 g/dL   RDW 81.1 (*) 91.4 - 78.2 %   Platelets 129 (*) 150 - 400 K/uL   Neutrophils Relative % 81 (*) 43 - 77 %   Neutro Abs 8.0 (*) 1.7 - 7.7 K/uL   Lymphocytes Relative 13  12 - 46 %   Lymphs Abs 1.3  0.7 - 4.0 K/uL   Monocytes Relative 5  3 - 12 %   Monocytes Absolute 0.5  0.1 - 1.0 K/uL   Eosinophils Relative 1  0 - 5 %   Eosinophils  Absolute 0.1  0.0 - 0.7 K/uL   Basophils Relative 0  0 - 1 %   Basophils Absolute 0.0  0.0 - 0.1 K/uL  APTT     Status: None   Collection Time    05/17/13  4:33 PM      Result Value Range   aPTT 37  24 - 37 seconds   Comment:            IF BASELINE aPTT IS ELEVATED,     SUGGEST PATIENT RISK ASSESSMENT     BE USED TO DETERMINE  APPROPRIATE     ANTICOAGULANT THERAPY.  PROTIME-INR     Status: Abnormal   Collection Time    05/17/13  4:33 PM      Result Value Range   Prothrombin Time 20.5 (*) 11.6 - 15.2 seconds   INR 1.82 (*) 0.00 - 1.49  GLUCOSE, CAPILLARY     Status: Abnormal   Collection Time    05/17/13  9:58 PM      Result Value Range   Glucose-Capillary 121 (*) 70 - 99 mg/dL  COMPREHENSIVE METABOLIC PANEL     Status: Abnormal   Collection Time    05/18/13  5:18 AM      Result Value Range   Sodium 144  135 - 145 mEq/L   Potassium 3.2 (*) 3.5 - 5.1 mEq/L   Chloride 114 (*) 96 - 112 mEq/L   CO2 21  19 - 32 mEq/L   Glucose, Bld 99  70 - 99 mg/dL   BUN 10  6 - 23 mg/dL   Creatinine, Ser 4.78 (*) 0.50 - 1.35 mg/dL   Calcium 8.1 (*) 8.4 - 10.5 mg/dL   Total Protein 5.6 (*) 6.0 - 8.3 g/dL   Albumin 1.9 (*) 3.5 - 5.2 g/dL   AST 14  0 - 37 U/L   ALT 13  0 - 53 U/L   Alkaline Phosphatase 61  39 - 117 U/L   Total Bilirubin 1.2  0.3 - 1.2 mg/dL   GFR calc non Af Amer >90  >90 mL/min   GFR calc Af Amer >90  >90 mL/min   Comment: (NOTE)     The eGFR has been calculated using the CKD EPI equation.     This calculation has not been validated in all clinical situations.     eGFR's persistently <90 mL/min signify possible Chronic Kidney     Disease.  CBC     Status: Abnormal   Collection Time    05/18/13  5:18 AM      Result Value Range   WBC 9.0  4.0 - 10.5 K/uL   RBC 3.32 (*) 4.22 - 5.81 MIL/uL   Hemoglobin 9.8 (*) 13.0 - 17.0 g/dL   HCT 29.5 (*) 62.1 - 30.8 %   MCV 89.5  78.0 - 100.0 fL   MCH 29.5  26.0 - 34.0 pg   MCHC 33.0  30.0 - 36.0 g/dL   RDW 65.7 (*) 84.6 - 96.2 %    Platelets 144 (*) 150 - 400 K/uL  GLUCOSE, CAPILLARY     Status: None   Collection Time    05/18/13  6:46 AM      Result Value Range   Glucose-Capillary 95  70 - 99 mg/dL   Dg Chest Port 1 View  05/17/2013   CLINICAL DATA:  Shortness of breath.  EXAM: PORTABLE CHEST - 1 VIEW  COMPARISON:  09/17 and 04/27/2013 and 04/11/2013  FINDINGS: There is persistent cardiomegaly with slight pulmonary vascular congestion. Bibasilar effusions and atelectasis are slightly improved. No acute osseous abnormality.  IMPRESSION: Slight improvement in the vascular congestion and bibasilar atelectasis and effusions.   Electronically Signed   By: Geanie Cooley   On: 05/17/2013 16:12    Review of Systems  HENT: Negative.   Respiratory: Positive for shortness of breath.   Cardiovascular: Negative for chest pain.  Gastrointestinal: Negative.     Blood pressure 150/61, pulse 72, temperature 99.1 F (37.3 C), temperature source Oral, resp. rate 18, SpO2 97.00%. Physical Exam  Constitutional:  No distress.  HENT:  Head: Normocephalic and atraumatic.  Nose: Nose normal.  Eyes: EOM are normal. Pupils are equal, round, and reactive to light.  Cardiovascular: Normal rate and intact distal pulses.   Respiratory: No respiratory distress.  Musculoskeletal: He exhibits no tenderness.  Neurological: He is alert.  Skin: Skin is warm and dry.  Psychiatric: He has a normal mood and affect.     Assessment/Plan Back delayed wound closure.  Will take to the OR today for wound closure.  Surgery discussed with patient.  Dr Shon Baton talked to family members yesterday.  Waiting for op permit signatures.  Daughter coming in to sign this morning.  Appreciate medicine assistance.    Corbyn Steedman M 05/18/2013, 8:36 AM

## 2013-05-18 NOTE — Progress Notes (Signed)
RN has called family members 3 different times with no return phone call. Patients family member needs to sign consent for surgical procedure that is to be done today.  PA made aware of difficulty getting in touch with family members.

## 2013-05-18 NOTE — Anesthesia Preprocedure Evaluation (Addendum)
Anesthesia Evaluation  Patient identified by MRN, date of birth, ID band Patient awake    Reviewed: Allergy & Precautions, H&P , NPO status , Patient's Chart, lab work & pertinent test results, reviewed documented beta blocker date and time   Airway Mallampati: II TM Distance: >3 FB Neck ROM: full    Dental  (+) Dental Advisory Given   Pulmonary shortness of breath,  breath sounds clear to auscultation        Cardiovascular hypertension, On Medications and Pt. on home beta blockers + CAD and + Past MI Rhythm:regular     Neuro/Psych negative neurological ROS  negative psych ROS   GI/Hepatic negative GI ROS, Neg liver ROS,   Endo/Other  diabetes, Insulin Dependent  Renal/GU Renal InsufficiencyRenal disease  negative genitourinary   Musculoskeletal   Abdominal   Peds  Hematology  (+) Blood dyscrasia, anemia ,   Anesthesia Other Findings See surgeon's H&P   Reproductive/Obstetrics negative OB ROS                          Anesthesia Physical Anesthesia Plan  ASA: III  Anesthesia Plan: General   Post-op Pain Management:    Induction: Intravenous  Airway Management Planned: Oral ETT  Additional Equipment:   Intra-op Plan:   Post-operative Plan: Extubation in OR  Informed Consent: I have reviewed the patients History and Physical, chart, labs and discussed the procedure including the risks, benefits and alternatives for the proposed anesthesia with the patient or authorized representative who has indicated his/her understanding and acceptance.   Dental Advisory Given  Plan Discussed with: CRNA and Surgeon  Anesthesia Plan Comments:         Anesthesia Quick Evaluation

## 2013-05-18 NOTE — Progress Notes (Addendum)
INITIAL NUTRITION ASSESSMENT  DOCUMENTATION CODES Per approved criteria  -Severe malnutrition in the context of acute illness/injury   INTERVENTION:  1. D/C Glucerna Shake  2. Ensure Complete po BID, each supplement provides 350 kcal and 13 grams of protein.  3. Continue 30 ml Prostat BID  4. Discussed importance of adequate nutrition, especially protein with patient  NUTRITION DIAGNOSIS: Malnutrition related to acute illness/injury as evidenced by 9% weight loss x 6 weeks and severe fat and muscle wating.   Goal: Pt to meet >/= 90% of their estimated nutrition needs   Monitor:  PO intake, supplement acceptance, weight trend, labs   Reason for Assessment: MD Consult  79 y.o. male  Admitting Dx: Osteomyelitis of lumbar spine  ASSESSMENT: Pt admitted for [facility] for lumbar wound debridement and closure. Pt with hx of multiple thoracolumbar surgeries due to failed hardware and postop wound infection.  Pt also with skin breakdown.  Pt reports no appetite, thinks he can drink 1-2 ensures per day. Pt reports problems chewing and needs softer foods. Pt did not engage in conversation but did answer questions. Pt with poor eye contact and mostly looked straight ahead and only looked at this RD a couple of times. Potassium is low and is being repleted IV. Pt also on a MVI.    Nutrition Focused Physical Exam:  Subcutaneous Fat:  Orbital Region: WNL Upper Arm Region: WNL but with sagging skin Thoracic and Lumbar Region: severe wasting  Muscle:  Temple Region: severe wasting  Clavicle Bone Region: severe wasting Clavicle and Acromion Bone Region: severe wasting Scapular Bone Region: severe wasting Dorsal Hand: WNL Patellar Region: mild-moderate wasting Anterior Thigh Region: mild-moderate wasting Posterior Calf Region: mild-moderate wasting  Edema: 2+   Height: Ht Readings from Last 1 Encounters:  04/11/13 5\' 10"  (1.778 m)    Weight: Wt Readings  from Last 1 Encounters:  05/04/13 185 lb 9.6 oz (84.188 kg)  No new weight documented this admission. Per bed scale this am pt was 165 lb.   Ideal Body Weight: 75.4 kg  % Ideal Body Weight: 111%  Wt Readings from Last 10 Encounters:  05/04/13 185 lb 9.6 oz (84.188 kg)  05/04/13 185 lb 9.6 oz (84.188 kg)  04/13/13 211 lb 6.7 oz (95.9 kg)  04/13/13 211 lb 6.7 oz (95.9 kg)  04/13/13 211 lb 6.7 oz (95.9 kg)  04/01/13 204 lb 12.9 oz (92.9 kg)  04/01/13 204 lb 12.9 oz (92.9 kg)  03/15/13 202 lb (91.627 kg)  03/11/13 202 lb (91.627 kg)  03/08/13 203 lb (92.08 kg)    Usual Body Weight: 204 lb 03/2013  % Usual Body Weight: 91%  BMI:  There is no weight on file to calculate BMI.  Estimated Nutritional Needs: Kcal: 2000-2200  Protein: 115-125 gm  Fluid: 2-2.2 L   Skin: stage I sacrum, stage II buttocks, incision on back  Diet Order: NPO  EDUCATION NEEDS: -Education needs addressed   Intake/Output Summary (Last 24 hours) at 05/18/13 1054 Last data filed at 05/18/13 0515  Gross per 24 hour  Intake 168.33 ml  Output    550 ml  Net -381.67 ml    Last BM: 9/30   Labs:   Recent Labs Lab 05/15/13 0640 05/17/13 0810 05/17/13 1633 05/18/13 0518  NA 143 142 145 144  K 3.5 3.1* 4.3 3.2*  CL 114* 112 115* 114*  CO2 22 20 22 21   BUN 12 8 10 10   CREATININE 0.45* 0.40* 0.50 0.48*  CALCIUM  8.1* 8.1* 8.2* 8.1*  MG 1.8  --   --   --   PHOS 2.0*  --   --   --   GLUCOSE 92 87 113* 99    CBG (last 3)   Recent Labs  05/17/13 1602 05/17/13 2158 05/18/13 0646  GLUCAP 116* 121* 95   Lab Results  Component Value Date   HGBA1C 5.2 04/16/2013   Scheduled Meds: . antiseptic oral rinse  15 mL Mouth Rinse BID  . atorvastatin  10 mg Oral Daily  . brimonidine  1 drop Both Eyes BID  . calcium-vitamin D  1 tablet Oral Q breakfast  . carvedilol  3.125 mg Oral BID WC  . ceFEPime (MAXIPIME) IV  2 g Intravenous Q12H  . cholecalciferol  1,000 Units Oral Daily  . famotidine  20  mg Oral Daily  . feeding supplement  237 mL Oral Q24H  . feeding supplement  30 mL Oral BID  . furosemide  20 mg Oral BID  . insulin aspart  0-5 Units Subcutaneous QHS  . insulin aspart  0-9 Units Subcutaneous TID WC  . lactobacillus  1 g Oral Daily  . multivitamin with minerals  1 tablet Oral Daily  . polyethylene glycol  17 g Oral Daily  . sertraline  50 mg Oral QHS  . tamsulosin  0.4 mg Oral Daily  . vitamin C  2,000 mg Oral Daily  . zinc sulfate  220 mg Oral Daily    Continuous Infusions: . sodium chloride 0.9 % 1,000 mL with potassium chloride 40 mEq infusion 100 mL/hr at 05/17/13 2219    Past Medical History  Diagnosis Date  . Hypertension   . Chronic kidney disease     bph  . Arthritis   . Coronary artery disease   . High cholesterol   . Myocardial infarction 2002    "before his lungs or legs" (05/05/2013)  . DVT (deep venous thrombosis)     "after back OR in August" (05/05/2013)  . Type II diabetes mellitus   . History of blood transfusion     "some since OR in 03/2013" (05/05/2013)  . Chronic lower back pain   . Gout   . BPH (benign prostatic hypertrophy)   . Prostate cancer     "S/P radiation tx" ((05/05/2013)  . Skin cancer of face     "had several taken off his face; had his nose cut/reconstructed" (05/05/2013)  . Shortness of breath     Past Surgical History  Procedure Laterality Date  . Appendectomy    . Coronary artery bypass graft  11/09/2000    LIMA to LAD, SVG to RPDA, seq SVG to OM2 and D1 by [name] [facility]  . Lumbar fusion  03/15/2013    Dr Shon Baton  . Posterior lumbar fusion 4 level N/A 03/16/2013    Procedure: T9 - L3 POSTERIOR POSTERIOR SPINAL FUSION ;  Surgeon: Venita Lick, MD;  Location: [facility];  Service: Orthopedics;  Laterality: N/A;  . Hardware removal N/A 03/30/2013    Procedure: HARDWARE REMOVAL/IRRIGATION & DEBRIDEMENT OF WOUND ON BACK;  Surgeon: Venita Lick, MD;  Location: [facility];  Service: Orthopedics;  Laterality: N/A;  .  Incision and drainage of wound N/A 04/13/2013    Procedure: IRRIGATION AND DEBRIDEMENT WOUND AND VAC DRESSING;  Surgeon: Venita Lick, MD;  Location: [facility] OR;  Service: Orthopedics;  Laterality: N/A;  . Application of wound vac  04/13/2013; 05/04/2013  . Back surgery    . Cataract  extraction w/ intraocular lens  implant, bilateral Bilateral   . Secondary closure of wound N/A 05/05/2013    Procedure: WASH-OUT CLOSURE OF WOUND PARTIAL;  Surgeon: Venita Lick, MD;  Location: [facility];  Service: Orthopedics;  Laterality: N/A;    Kendell Bane RD, LDN, CNSC [phone] Pager [phone] After Hours Pager

## 2013-05-18 NOTE — Anesthesia Postprocedure Evaluation (Signed)
Anesthesia Post Note  Patient: [name] [name]  Procedure(s) Performed: Procedure(s) (LRB): REMOVAL WOUND VAC, IRRIGATION AND DEBRIDEMENT,  WOUND CLOSURE POSTERIOR THORACOLUMBAR INCISION   (N/A)  Anesthesia type: General  Patient location: PACU  Post pain: Pain level controlled  Post assessment: Patient's Cardiovascular Status Stable  Last Vitals:  Filed Vitals:   05/18/13 1700  BP:   Pulse: 80  Temp:   Resp: 20    Post vital signs: Reviewed and stable  Level of consciousness: alert  Complications: No apparent anesthesia complications

## 2013-05-18 NOTE — Transfer of Care (Signed)
Immediate Anesthesia Transfer of Care Note  Patient: [name] [name]  Procedure(s) Performed: Procedure(s): REMOVAL WOUND VAC, IRRIGATION AND DEBRIDEMENT,  WOUND CLOSURE POSTERIOR THORACOLUMBAR INCISION   (N/A)  Patient Location: PACU  Anesthesia Type:General  Level of Consciousness: awake and alert   Airway & Oxygen Therapy: Patient Spontanous Breathing and Patient connected to nasal cannula oxygen  Post-op Assessment: Report given to PACU RN, Post -op Vital signs reviewed and stable and Patient moving all extremities X 4  Post vital signs: Reviewed and stable  Complications: No apparent anesthesia complications

## 2013-05-18 NOTE — H&P (Signed)
Agree with above Discussed with patients son All risks/benefits reviewed Plan on wash-out and closure of spine wound

## 2013-05-18 NOTE — Progress Notes (Signed)
[facility] PROGRESS NOTE  [name] [name] RUE:[id number] DOB: 03-07-1934 DOA: 05/17/2013 PCP: Provider Not In System  HPI/Brief narrative 79 year old male with complex past medical history significant for postop wound infection of thoracolumbar spine, DM, chronic kidney disease, recent DVT, hypertension and arthritis was transferred from on 05/17/13 for wound closure by orthopedics. He underwent spinal fusion on 02/14/2013. Unfortunately he developed infection surrounding his hardware. Cultures revealed Serratia. He is being treated with IV cefepime with stop date 05/21/13. Hospitalists were consulted for general medical management postop.  Assessment/Plan:  Postop wound infection (Serratia)/osteomyelitis of thoraco lumbar spine - Management per orthopedics: Plans for thoracolumbar I&D and wound closure. - Continue IV cefepime through 05/21/13   Type II DM - Good inpatient control. - Continue SSI.  Anemia and thrombocytopenia - Stable  - Follow CBC  Hypokalemia - Replating and IV fluids per primary service. - Follow BMP  Severe malnutrition - per nutrition consultation.  Recent DVT - Superficial right basilic vein 05/03/13 & multiple bilateral lower extremity DVTs 04/24/13 - Status post IVC filter  BPH - Continue Flomax.  VRE UTI - Treated  Hypertension - Controlled  Chronic kidney disease - Stable  CAD  - Stable    Fungal rash - Nystatin powder   DVT prophylaxis:  to be determined postoperatively by primary service  Lines/catheters:  PIV  Nutrition:  n.p.o. for surgery   Activity:   per primary service  Code Status: Full Family Communication:  none at bedside.  Disposition Plan:  to be determine      Procedures:   wound VAC on back   Antibiotics:   IV cefepime    Subjective:  denies complaints. Nurses have noticed a rash on buttock and scrotal area.   Objective: Filed Vitals:   05/17/13 2153 05/18/13 0514 05/18/13 0916 05/18/13 1242  BP:  140/71 150/61 154/70 148/76  Pulse: 86 72 78 81  Temp: 98.4 F (36.9 C) 99.1 F (37.3 C)  97.7 F (36.5 C)  TempSrc: Oral Oral    Resp: 20 18  18   SpO2: 98% 97%  97%    Intake/Output Summary (Last 24 hours) at 05/18/13 1635 Last data filed at 05/18/13 0515  Gross per 24 hour  Intake 168.33 ml  Output    550 ml  Net -381.67 ml   There were no vitals filed for this visit.   Exam:  General exam:  elderly frail male patient lying comfortably supine in bed.  Respiratory system: reduced breath sounds in the bases but otherwise clear to auscultation . No increased work of breathing. Cardiovascular system: S1 & S2 heard, RRR. No JVD, murmurs, gallops, clicks or pedal edema. Gastrointestinal system: Abdomen is nondistended, soft and nontender. Normal bowel sounds heard. Central nervous system: Alert and orientedto person and place . No focal neurological deficits. Extremities: Symmetric 5 x 5 power. Musculoskeletal system: Wound VAC on back     Data Reviewed: Basic Metabolic Panel:  Recent Labs Lab 05/13/13 0830 05/15/13 0640 05/17/13 0810 05/17/13 1633 05/18/13 0518  NA 142 143 142 145 144  K 3.8 3.5 3.1* 4.3 3.2*  CL 113* 114* 112 115* 114*  CO2 20 22 20 22 21   GLUCOSE 88 92 87 113* 99  BUN 12 12 8 10 10   CREATININE 0.57 0.45* 0.40* 0.50 0.48*  CALCIUM 8.2* 8.1* 8.1* 8.2* 8.1*  MG  --  1.8  --   --   --   PHOS  --  2.0*  --   --   --  Liver Function Tests:  Recent Labs Lab 05/17/13 1633 05/18/13 0518  AST 12 14  ALT 12 13  ALKPHOS 58 61  BILITOT 1.0 1.2  PROT 5.3* 5.6*  ALBUMIN 1.8* 1.9*   No results found for this basename: LIPASE, AMYLASE,  in the last 168 hours No results found for this basename: AMMONIA,  in the last 168 hours CBC:  Recent Labs Lab 05/13/13 0830 05/15/13 0640 05/17/13 0810 05/17/13 1633 05/18/13 0518  WBC 9.3 6.0 11.7* 9.9 9.0  NEUTROABS  --   --   --  8.0*  --   HGB 8.7* 9.4* 9.7* 9.2* 9.8*  HCT 27.5* 28.6* 29.8* 28.1*  29.7*  MCV 93.2 91.1 89.8 90.1 89.5  PLT 126* 153 151 129* 144*   Cardiac Enzymes: No results found for this basename: CKTOTAL, CKMB, CKMBINDEX, TROPONINI,  in the last 168 hours BNP (last 3 results)  Recent Labs  04/16/13 0600 04/20/13 0500 05/04/13 2046  PROBNP 872.7* 569.7* 1031.0*   CBG:  Recent Labs Lab 05/17/13 2158 05/18/13 0646 05/18/13 1116 05/18/13 1217 05/18/13 1325  GLUCAP 121* 95 98 99 92    Recent Results (from the past 240 hour(s))  URINE CULTURE     Status: None   Collection Time    05/08/13 11:13 PM      Result Value Range Status   Specimen Description URINE, RANDOM   Final   Special Requests NONE   Final   Culture  Setup Time 05/08/2013 23:57   Final   Colony Count >=100,000 COLONIES/ML   Final   Culture VANCOMYCIN RESISTANT ENTEROCOCCUS ISOLATED   Final   Report Status 05/12/2013 FINAL   Final   Organism ID, Bacteria VANCOMYCIN RESISTANT ENTEROCOCCUS ISOLATED   Final      Additional labs: 1.  none      Studies: Dg Chest Port 1 View  05/17/2013   CLINICAL DATA:  Shortness of breath.  EXAM: PORTABLE CHEST - 1 VIEW  COMPARISON:  09/17 and 04/27/2013 and 04/11/2013  FINDINGS: There is persistent cardiomegaly with slight pulmonary vascular congestion. Bibasilar effusions and atelectasis are slightly improved. No acute osseous abnormality.  IMPRESSION: Slight improvement in the vascular congestion and bibasilar atelectasis and effusions.   Electronically Signed   By: Geanie Cooley   On: 05/17/2013 16:12        Scheduled Meds: . [facility] HOLD] antiseptic oral rinse  15 mL Mouth Rinse BID  . [facility] HOLD] atorvastatin  10 mg Oral Daily  . [[facility] HOLD] brimonidine  1 drop Both Eyes BID  . [[facility] HOLD] calcium-vitamin D  1 tablet Oral Q breakfast  . [[facility] HOLD] carvedilol  3.125 mg Oral BID WC  . [[facility] HOLD] ceFEPime (MAXIPIME) IV  2 g Intravenous Q12H  . [facility] HOLD] cholecalciferol  1,000 Units Oral Daily  . [facility] HOLD] famotidine  20 mg Oral Daily  . [facility]  HOLD] feeding supplement  237 mL Oral BID BM  . [facility] HOLD] feeding supplement  30 mL Oral BID  . [facility] HOLD] furosemide  20 mg Oral BID  . [[facility] HOLD] insulin aspart  0-5 Units Subcutaneous QHS  . [[facility] HOLD] insulin aspart  0-9 Units Subcutaneous TID WC  . [[facility] HOLD] lactobacillus  1 g Oral Daily  . [[facility] HOLD] multivitamin with minerals  1 tablet Oral Daily  . [[facility] HOLD] polyethylene glycol  17 g Oral Daily  . [facility] HOLD] sertraline  50 mg Oral QHS  . [facility] HOLD] tamsulosin  0.4 mg  Oral Daily  . [facility] HOLD] vitamin C  2,000 mg Oral Daily  . [facility] HOLD] zinc sulfate  220 mg Oral Daily   Continuous Infusions: . lactated ringers 50 mL/hr at 05/18/13 1402  . lactated ringers    . sodium chloride 0.9 % 1,000 mL with potassium chloride 40 mEq infusion 100 mL/hr at 05/17/13 2219    Principal Problem:   Osteomyelitis of lumbar spine Active Problems:   CAD (coronary artery disease)   DM2 (diabetes mellitus, type 2)   HTN (hypertension)   Hyperlipidemia   Protein-calorie malnutrition, severe   Anemia   Benign hypertension   DVT (deep venous thrombosis) 05-03-13    Time spent: 25 minutes     [facility]  [facility] Pager [phone].   If 8PM-8AM, please contact night-coverage at [email or web address], password [email or web address] 05/18/2013, 4:35 PM  LOS: 1 day

## 2013-05-18 NOTE — Brief Op Note (Signed)
05/17/2013 - 05/18/2013  3:57 PM  PATIENT:  [name] [name] Rash  80 y.o. male  PRE-OPERATIVE DIAGNOSIS:  POST OP BACK INFECTION   POST-OPERATIVE DIAGNOSIS:  * No post-op diagnosis entered *  PROCEDURE:  Procedure(s): IRRIGATION AND DEBRIDEMENT WOUND CLOSURE   (N/A)  SURGEON:  Surgeon(s) and Role:    * Venita Lick, MD - Primary  PHYSICIAN ASSISTANT:   ASSISTANTS: none   ANESTHESIA:   general  EBL:     BLOOD ADMINISTERED:none  DRAINS: none   LOCAL MEDICATIONS USED:  NONE  SPECIMEN:  Source of Specimen:  back  DISPOSITION OF SPECIMEN:  micro  COUNTS:  YES  TOURNIQUET:  * No tourniquets in log *  DICTATION: .Other Dictation: Dictation Number [id number]  PLAN OF CARE: Admit to inpatient   PATIENT DISPOSITION:  PACU - hemodynamically stable.

## 2013-05-18 NOTE — Progress Notes (Signed)
Pt came to [place] with a wound vac tube attached to his back, but without a wound vac machine connected. To my understanding, the RN at [facility] said pt needs a special wound vac to be connected to the tube. PA Ralene Bathe was notified.Will monitor pt.

## 2013-05-18 NOTE — Progress Notes (Signed)
MD requested two RNs to get verbal consent from patient's daughter.  Verbal consent for surgery and blood obtained from Micah Flesher at 12:00 by myself and Deneise Lever, RN.

## 2013-05-18 NOTE — Progress Notes (Signed)
Iv team has had 3 nurses assess possible sites for patient's iv. None available at this time. IV team indicated anesthesia would be able to start once pt in OR. No further steps taken to continue fluids.

## 2013-05-18 NOTE — Anesthesia Procedure Notes (Addendum)
Procedure Name: Intubation Date/Time: 05/18/2013 3:07 PM Performed by: Elon Alas Pre-anesthesia Checklist: Patient identified, Timeout performed, Emergency Drugs available, Suction available and Patient being monitored Patient Re-evaluated:Patient Re-evaluated prior to inductionOxygen Delivery Method: Circle system utilized Preoxygenation: Pre-oxygenation with 100% oxygen Intubation Type: IV induction Ventilation: Mask ventilation without difficulty Laryngoscope Size: Mac and 4 Grade View: Grade II Tube type: Oral Tube size: 7.5 mm Number of attempts: 1 Airway Equipment and Method: Stylet Placement Confirmation: positive ETCO2,  ETT inserted through vocal cords under direct vision and breath sounds checked- equal and bilateral Secured at: 23 cm Tube secured with: Tape Dental Injury: Teeth and Oropharynx as per pre-operative assessment

## 2013-05-18 NOTE — Preoperative (Signed)
Beta Blockers   Reason not to administer Beta Blockers:Not Applicable 

## 2013-05-19 LAB — CBC
HCT: 30 % — ABNORMAL LOW (ref 39.0–52.0)
Hemoglobin: 9.7 g/dL — ABNORMAL LOW (ref 13.0–17.0)
MCH: 29.6 pg (ref 26.0–34.0)
MCHC: 32.3 g/dL (ref 30.0–36.0)
MCV: 91.5 fL (ref 78.0–100.0)
RDW: 18.9 % — ABNORMAL HIGH (ref 11.5–15.5)

## 2013-05-19 LAB — BASIC METABOLIC PANEL
BUN: 12 mg/dL (ref 6–23)
Calcium: 7.9 mg/dL — ABNORMAL LOW (ref 8.4–10.5)
GFR calc Af Amer: >90 mL/min (ref >90)
GFR calc non Af Amer: >90 mL/min (ref >90)
Potassium: 3.3 mEq/L — ABNORMAL LOW (ref 3.5–5.1)
Sodium: 144 mEq/L (ref 135–145)

## 2013-05-19 LAB — GLUCOSE, CAPILLARY
Glucose-Capillary: 80 mg/dL (ref 70–99)
Glucose-Capillary: 128 mg/dL — ABNORMAL HIGH (ref 70–99)
Glucose-Capillary: 181 mg/dL — ABNORMAL HIGH (ref 70–99)

## 2013-05-19 MED ORDER — POTASSIUM CHLORIDE CRYS ER 20 MEQ PO TBCR
40.0000 meq | EXTENDED_RELEASE_TABLET | Freq: Once | ORAL | Status: AC
  Administered 2013-05-19: 40 meq via ORAL
  Filled 2013-05-19: qty 2

## 2013-05-19 MED ORDER — ENSURE COMPLETE PO LIQD
237.0000 mL | Freq: Three times a day (TID) | ORAL | Status: DC
  Administered 2013-05-19 – 2013-05-24 (×11): 237 mL via ORAL

## 2013-05-19 NOTE — Progress Notes (Signed)
ANTIBIOTIC CONSULT NOTE - Follow Up Consult  Pharmacy Consult for cefepime Indication: lumbo-sacral osteo  No Known Allergies  Patient Measurements: Height: 5\' 10"  (177.8 cm) Weight: 185 lb 10 oz (84.2 kg) IBW/kg (Calculated) : 73   Vital Signs: Temp: 98.3 F (36.8 C) (10/02 0549) BP: 132/51 mmHg (10/02 0549) Pulse Rate: 82 (10/02 0549) Intake/Output from previous day: 10/01 0701 - 10/02 0700 In: 580 [P.O.:480; I.V.:100] Out: 125 [Urine:125] Intake/Output from this shift:    Labs:  Recent Labs  05/17/13 1633 05/18/13 0518 05/19/13 0530  WBC 9.9 9.0 9.1  HGB 9.2* 9.8* 9.7*  PLT 129* 144* 148*  CREATININE 0.50 0.48* 0.52   Estimated Creatinine Clearance: 81.1 ml/min (by C-G formula based on Cr of 0.52).   Microbiology: Recent Results (from the past 720 hour(s))  URINE CULTURE     Status: None   Collection Time    04/25/13 11:00 AM      Result Value Range Status   Specimen Description URINE, RANDOM   Final   Special Requests Normal   Final   Culture  Setup Time     Final   Value: 04/26/2013 03:03     Performed at [employer] Count     Final   Value: NO GROWTH     Performed at [facility]   Culture     Final   Value: NO GROWTH     Performed at [facility]   Report Status 04/27/2013 FINAL   Final  SURGICAL PCR SCREEN     Status: Abnormal   Collection Time    05/05/13 12:09 AM      Result Value Range Status   MRSA, PCR POSITIVE (*) NEGATIVE Final   Comment: RESULT CALLED TO, READ BACK BY AND VERIFIED WITH:     REBECCA SPARKS RN [phone] 0300 GREEN R   Staphylococcus aureus POSITIVE (*) NEGATIVE Final   Comment:            The Xpert SA Assay (FDA     approved for NASAL specimens     in patients over 43 years of age),     is one component of     a comprehensive surveillance     program.  Test performance has     been validated by [employer] for patients greater     than or equal to 35 year old.     It is not  intended     to diagnose infection nor to     guide or monitor treatment.     RESULT CALLED TO, READ BACK BY AND VERIFIED WITH:     REBECCA SPARKS RN [phone] 0300 GREEN R  URINE CULTURE     Status: None   Collection Time    05/08/13 11:13 PM      Result Value Range Status   Specimen Description URINE, RANDOM   Final   Special Requests NONE   Final   Culture  Setup Time 05/08/2013 23:57   Final   Colony Count >=100,000 COLONIES/ML   Final   Culture VANCOMYCIN RESISTANT ENTEROCOCCUS ISOLATED   Final   Report Status 05/12/2013 FINAL   Final   Organism ID, Bacteria VANCOMYCIN RESISTANT ENTEROCOCCUS ISOLATED   Final  ANAEROBIC CULTURE     Status: None   Collection Time    05/18/13  3:39 PM      Result Value Range Status   Specimen Description WOUND BACK  Final   Special Requests LONGITUDINAL   Final   Gram Stain     Final   Value: NO WBC SEEN     NO SQUAMOUS EPITHELIAL CELLS SEEN     NO ORGANISMS SEEN     Performed at [facility]   Culture PENDING   Incomplete   Report Status PENDING   Incomplete  WOUND CULTURE     Status: None   Collection Time    05/18/13  3:39 PM      Result Value Range Status   Specimen Description WOUND BACK   Final   Special Requests LONGITUDINAL WOUND   Final   Gram Stain     Final   Value: NO WBC SEEN     NO SQUAMOUS EPITHELIAL CELLS SEEN     NO ORGANISMS SEEN     Performed at [facility]   Culture     Final   Value: NO GROWTH 1 DAY     Performed at [facility]   Report Status PENDING   Incomplete   Assessment: 79 yo M [facility] to [facility] from [facility] for wound closure completion and removal of his wound vac- had this done yesterday.  Per past notes, the planned stop date for his cefepime is 05/21/2013. This has not yet been entered. His renal function is stable and dose remains appropriate. WBC are WNL and patient is afebrile.  Goal of Therapy:  Eradicate infection  Plan:  1. Cefepime 2 gm IV q12 - planned stop date  is 05/21/13 which needs to be entered if clinically appropriate by MD 2. F/u renal function, wbc, temp, culture data, clinical course and orthopedic surgery plans  Ellana Kawa D. Lilac Hoff, PharmD Clinical Pharmacist Pager: [phone] 05/19/2013 1:56 PM

## 2013-05-19 NOTE — Progress Notes (Signed)
NUTRITION FOLLOW UP/CONSULT  Consult received for consideration of TPN. I would highly recommend the avoidance of TPN in pt at high risk for infection. Pt has a functioning GI tract, therefore recommend utilizing enteral nutrition support if PO intake does not improve. Pt would be unable to d/c to a SNF with a nasogastric feeding tube for short term enteral nutrition support.   Intervention:    1.Ensure Complete po TID, each supplement provides 350 kcal and 13 grams of protein.   2. Continue 30 ml Prostat BID   3. Encourage PO intake  Nutrition Dx:   Malnutrition related to acute illness/injury as evidenced by 9% weight loss x 6 weeks and severe fat and muscle wasting; ongoing.   Goal:  Pt to meet >/= 90% of their estimated nutrition needs, not met.   Monitor:  PO intake, supplement acceptance, weight trend, labs   Assessment:   Pt admitted for [facility] for lumbar wound debridement and closure. Pt with hx of multiple thoracolumbar surgeries due to failed hardware and postop wound infection.  Pt also with skin breakdown.  Pt has not ate much today per RN but has drank an ensure, will increase to TID.    Height: Ht Readings from Last 1 Encounters:  05/19/13 5\' 10"  (1.778 m)    Weight Status:   Wt Readings from Last 1 Encounters:  05/19/13 185 lb 10 oz (84.2 kg)    Re-estimated needs:  Kcal: 2000-2200  Protein: 115-125 gm  Fluid: 2-2.2 L  Skin: stage I sacrum, stage II buttocks, incision on back  Diet Order: Carb Control   Intake/Output Summary (Last 24 hours) at 05/19/13 1357 Last data filed at 05/19/13 0415  Gross per 24 hour  Intake    580 ml  Output    125 ml  Net    455 ml    Last BM: 9/30   Labs:   Recent Labs Lab 05/15/13 0640  05/17/13 1633 05/18/13 0518 05/19/13 0530  NA 143  < > 145 144 144  K 3.5  < > 4.3 3.2* 3.3*  CL 114*  < > 115* 114* 114*  CO2 22  < > 22 21 21   BUN 12  < > 10 10 12   CREATININE 0.45*  < > 0.50 0.48*  0.52  CALCIUM 8.1*  < > 8.2* 8.1* 7.9*  MG 1.8  --   --   --   --   PHOS 2.0*  --   --   --   --   GLUCOSE 92  < > 113* 99 75  < > = values in this interval not displayed.  CBG (last 3)   Recent Labs  05/18/13 2158 05/19/13 0642 05/19/13 1141  GLUCAP 107* 80 108*    Scheduled Meds: . antiseptic oral rinse  15 mL Mouth Rinse BID  . atorvastatin  10 mg Oral Daily  . brimonidine  1 drop Both Eyes BID  . calcium-vitamin D  1 tablet Oral Q breakfast  . carvedilol  3.125 mg Oral BID WC  . ceFEPime (MAXIPIME) IV  2 g Intravenous Q12H  . cholecalciferol  1,000 Units Oral Daily  . docusate sodium  100 mg Oral BID  . famotidine  20 mg Oral Daily  . feeding supplement  237 mL Oral BID BM  . feeding supplement  30 mL Oral BID  . furosemide  20 mg Oral BID  . insulin aspart  0-5 Units Subcutaneous QHS  . insulin aspart  0-9 Units Subcutaneous TID WC  . lactobacillus  1 g Oral Daily  . multivitamin with minerals  1 tablet Oral Daily  . polyethylene glycol  17 g Oral Daily  . sertraline  50 mg Oral QHS  . tamsulosin  0.4 mg Oral Daily  . vitamin C  2,000 mg Oral Daily  . zinc sulfate  220 mg Oral Daily    Continuous Infusions: . sodium chloride    . sodium chloride 0.9 % 1,000 mL with potassium chloride 40 mEq infusion      Kendell Bane RD, LDN, CNSC [phone] Pager [phone] After Hours Pager

## 2013-05-19 NOTE — Progress Notes (Addendum)
Subjective: Patient doing well.  Pain controlled.     Objective: Vital signs in last 24 hours: Temp:  [97.7 F (36.5 C)-98.9 F (37.2 C)] 98.3 F (36.8 C) (10/02 0549) Pulse Rate:  [77-85] 82 (10/02 0549) Resp:  [15-27] 15 (10/02 0549) BP: (122-154)/(51-93) 132/51 mmHg (10/02 0549) SpO2:  [96 %-100 %] 98 % (10/02 0549)  Intake/Output from previous day: 10/01 0701 - 10/02 0700 In: 580 [P.O.:480; I.V.:100] Out: 125 [Urine:125] Intake/Output this shift:     Recent Labs  05/17/13 0810 05/17/13 1633 05/18/13 0518 05/19/13 0530  HGB 9.7* 9.2* 9.8* 9.7*    Recent Labs  05/18/13 0518 05/19/13 0530  WBC 9.0 9.1  RBC 3.32* 3.28*  HCT 29.7* 30.0*  PLT 144* 148*    Recent Labs  05/18/13 0518 05/19/13 0530  NA 144 144  K 3.2* 3.3*  CL 114* 114*  CO2 21 21  BUN 10 12  CREATININE 0.48* 0.52  GLUCOSE 99 75  CALCIUM 8.1* 7.9*    Recent Labs  05/17/13 1633  INR 1.82*    Exam:  Neurologically intact. Calves nontender.    Assessment/Plan: Transfer to snf today if possible and stable per medicine.     OWENS,JAMES M 05/19/2013, 8:35 AM     Patient eating NVI Compartments soft/NT No fever/chills Continue to mobilize with therapy Possible SNF Monday

## 2013-05-19 NOTE — Op Note (Signed)
NAMEELRIDGE, STEMM             ACCOUNT NO.:  [id number]  MEDICAL RECORD NO.:  [id number]  LOCATION:  5N11C                        FACILITY:  [facility]  PHYSICIAN:  Alvy Beal, MD    DATE OF BIRTH:  06-01-1934  DATE OF PROCEDURE:  05/18/2013 DATE OF DISCHARGE:                              OPERATIVE REPORT   PREOPERATIVE DIAGNOSIS:  Postoperative spinal wound infection.  POSTOPERATIVE DIAGNOSIS:  Postoperative spinal wound infection.  OPERATIVE PROCEDURE:  I and D with planned delayed closure of wound.  COMPLICATIONS:  None.  CONDITION:  Stable.  HISTORY:  This is a very pleasant gentleman who had a T9 and L3 instrumented fusion, which was complicated by wound infection and hardware failure.  He has subsequently had 2 I and D's and that last one he has a wound left open with a VAC.  A week and a half ago, he had a partial closure of the wound with a planned return today for complete closure.  OPERATIVE NOTE:  The patient was brought to the operating room, placed supine on the operating room table.  After successful induction of general anesthesia and __________, he was turned into a lateral position and the back was prepped and draped in a standard fashion.  The time-out was taken to confirm the patient, procedure, and all other pertinent important data.  The VAC was removed and the wound itself was clean, dry.  There was granulation tissue.  There was no exposed __________ fracture has healed.  There was no drainage noted.  The back was irrigated with a L of fluid and then the wound was closed with interrupted #1 PDS suture vertical mattress and some simple sutures. The wound was closed nicely without any issues.  Dry dressing was applied and the patient was extubated __________.  At the end of the case, all needle and sponge counts were correct.  There were no adverse intraoperative events.     Alvy Beal, MD     DDB/MEDQ  D:  05/18/2013  T:  05/19/2013   Job:  [id number]

## 2013-05-19 NOTE — Progress Notes (Signed)
[facility] PROGRESS NOTE  [name] [name] ZOX:[id number] DOB: July 29, 1934 DOA: 05/17/2013 PCP: Provider Not In System  HPI/Brief narrative 79 year old male with complex past medical history significant for postop wound infection of thoracolumbar spine, DM, chronic kidney disease, recent DVT, hypertension and arthritis was transferred from on 05/17/13 for wound closure by orthopedics. He underwent spinal fusion on 02/14/2013. Unfortunately he developed infection surrounding his hardware. Cultures revealed Serratia. He is being treated with IV cefepime with stop date 05/21/13. Hospitalists were consulted for general medical management postop.  Assessment/Plan:  Postop wound infection (Serratia)/osteomyelitis of thoraco lumbar spine - Management per orthopedics - S/P I&D and delayed wound closure in OR on 10/1 - Continue IV cefepime through 05/21/13   Type II DM - Good inpatient control. - Continue SSI.  Anemia and thrombocytopenia - Stable   Hypokalemia - Replete & Follow BMP  Severe malnutrition - per nutrition consultation.  Recent DVT - Superficial right basilic vein 05/03/13 & multiple bilateral lower extremity DVTs 04/24/13 - Status post IVC filter  BPH - Continue Flomax.  VRE UTI - Treated  Hypertension - Controlled  Chronic kidney disease - Stable  CAD  - Stable    Fungal rash - Nystatin powder   DVT prophylaxis:  SCD's Lines/catheters:  PIV  Nutrition:  Diabetic  Activity:   per primary service  Code Status: Full Family Communication:  none at bedside.  Disposition Plan:  SNF when bed available. Per CM, cannot return to [facility].     Procedures:   S/P spinal wound I&D and delayed wound closure in OR on 10/1  Antibiotics:   IV cefepime    Subjective:  Somnolent this morning, arousable and denied complaints.  Objective: Filed Vitals:   05/19/13 0200 05/19/13 0549 05/19/13 1300 05/19/13 1434  BP: 128/59 132/51  133/61  Pulse: 85 82   80  Temp: 98.9 F (37.2 C) 98.3 F (36.8 C)  98.2 F (36.8 C)  TempSrc:    Oral  Resp: 16 15  18   Height:   5\' 10"  (1.778 m)   Weight:   84.2 kg (185 lb 10 oz)   SpO2: 100% 98%  100%    Intake/Output Summary (Last 24 hours) at 05/19/13 2017 Last data filed at 05/19/13 1700  Gross per 24 hour  Intake    360 ml  Output    875 ml  Net   -515 ml   Filed Weights   05/19/13 1300  Weight: 84.2 kg (185 lb 10 oz)     Exam:  General exam:  elderly frail male patient lying comfortably supine in bed.  Respiratory system: reduced breath sounds in the bases but otherwise clear to auscultation . No increased work of breathing. Cardiovascular system: S1 & S2 heard, RRR. No JVD, murmurs, gallops, clicks or pedal edema. Gastrointestinal system: Abdomen is nondistended, soft and nontender. Normal bowel sounds heard. Central nervous system: Somnolent but easily aroused and seemed slightly confused this morning . No focal neurological deficits. Extremities: Symmetric 5 x 5 power.     Data Reviewed: Basic Metabolic Panel:  Recent Labs Lab 05/15/13 0640 05/17/13 0810 05/17/13 1633 05/18/13 0518 05/19/13 0530  NA 143 142 145 144 144  K 3.5 3.1* 4.3 3.2* 3.3*  CL 114* 112 115* 114* 114*  CO2 22 20 22 21 21   GLUCOSE 92 87 113* 99 75  BUN 12 8 10 10 12   CREATININE 0.45* 0.40* 0.50 0.48* 0.52  CALCIUM 8.1* 8.1* 8.2* 8.1* 7.9*  MG 1.8  --   --   --   --  PHOS 2.0*  --   --   --   --    Liver Function Tests:  Recent Labs Lab 05/17/13 1633 05/18/13 0518  AST 12 14  ALT 12 13  ALKPHOS 58 61  BILITOT 1.0 1.2  PROT 5.3* 5.6*  ALBUMIN 1.8* 1.9*   No results found for this basename: LIPASE, AMYLASE,  in the last 168 hours No results found for this basename: AMMONIA,  in the last 168 hours CBC:  Recent Labs Lab 05/15/13 0640 05/17/13 0810 05/17/13 1633 05/18/13 0518 05/19/13 0530  WBC 6.0 11.7* 9.9 9.0 9.1  NEUTROABS  --   --  8.0*  --   --   HGB 9.4* 9.7* 9.2* 9.8*  9.7*  HCT 28.6* 29.8* 28.1* 29.7* 30.0*  MCV 91.1 89.8 90.1 89.5 91.5  PLT 153 151 129* 144* 148*   Cardiac Enzymes: No results found for this basename: CKTOTAL, CKMB, CKMBINDEX, TROPONINI,  in the last 168 hours BNP (last 3 results)  Recent Labs  04/16/13 0600 04/20/13 0500 05/04/13 2046  PROBNP 872.7* 569.7* 1031.0*   CBG:  Recent Labs Lab 05/18/13 1852 05/18/13 2158 05/19/13 0642 05/19/13 1141 05/19/13 1656  GLUCAP 99 107* 80 108* 128*    Recent Results (from the past 240 hour(s))  ANAEROBIC CULTURE     Status: None   Collection Time    05/18/13  3:39 PM      Result Value Range Status   Specimen Description WOUND BACK   Final   Special Requests LONGITUDINAL   Final   Gram Stain     Final   Value: NO WBC SEEN     NO SQUAMOUS EPITHELIAL CELLS SEEN     NO ORGANISMS SEEN     Performed at [facility]   Culture     Final   Value: NO ANAEROBES ISOLATED; CULTURE IN PROGRESS FOR 5 DAYS     Performed at [facility]   Report Status PENDING   Incomplete  WOUND CULTURE     Status: None   Collection Time    05/18/13  3:39 PM      Result Value Range Status   Specimen Description WOUND BACK   Final   Special Requests LONGITUDINAL WOUND   Final   Gram Stain     Final   Value: NO WBC SEEN     NO SQUAMOUS EPITHELIAL CELLS SEEN     NO ORGANISMS SEEN     Performed at [facility]   Culture     Final   Value: NO GROWTH 1 DAY     Performed at [facility]   Report Status PENDING   Incomplete      Additional labs: 1.  none      Studies: No results found.      Scheduled Meds: . antiseptic oral rinse  15 mL Mouth Rinse BID  . atorvastatin  10 mg Oral Daily  . brimonidine  1 drop Both Eyes BID  . calcium-vitamin D  1 tablet Oral Q breakfast  . carvedilol  3.125 mg Oral BID WC  . ceFEPime (MAXIPIME) IV  2 g Intravenous Q12H  . cholecalciferol  1,000 Units Oral Daily  . docusate sodium  100 mg Oral BID  . famotidine  20 mg  Oral Daily  . feeding supplement  237 mL Oral TID WC  . feeding supplement  30 mL Oral BID  . furosemide  20 mg Oral BID  . insulin aspart  0-5 Units Subcutaneous QHS  . insulin aspart  0-9 Units Subcutaneous TID WC  . lactobacillus  1 g Oral Daily  . multivitamin with minerals  1 tablet Oral Daily  . polyethylene glycol  17 g Oral Daily  . sertraline  50 mg Oral QHS  . tamsulosin  0.4 mg Oral Daily  . vitamin C  2,000 mg Oral Daily  . zinc sulfate  220 mg Oral Daily   Continuous Infusions: . sodium chloride 90 mL/hr at 05/19/13 1726  . sodium chloride 0.9 % 1,000 mL with potassium chloride 40 mEq infusion      Principal Problem:   Osteomyelitis of lumbar spine Active Problems:   CAD (coronary artery disease)   DM2 (diabetes mellitus, type 2)   HTN (hypertension)   Hyperlipidemia   Protein-calorie malnutrition, severe   Anemia   Benign hypertension   DVT (deep venous thrombosis) 05-03-13    Time spent: 25 minutes     [facility]  [facility] Pager [phone].   If 8PM-8AM, please contact night-coverage at [email or web address], password [facility] 05/19/2013, 8:17 PM  LOS: 2 days

## 2013-05-20 ENCOUNTER — Encounter (HOSPITAL_COMMUNITY): Payer: Self-pay | Admitting: Orthopedic Surgery

## 2013-05-20 DIAGNOSIS — D696 Thrombocytopenia, unspecified: Secondary | ICD-10-CM

## 2013-05-20 DIAGNOSIS — I10 Essential (primary) hypertension: Secondary | ICD-10-CM

## 2013-05-20 DIAGNOSIS — I2510 Atherosclerotic heart disease of native coronary artery without angina pectoris: Secondary | ICD-10-CM

## 2013-05-20 DIAGNOSIS — D649 Anemia, unspecified: Secondary | ICD-10-CM

## 2013-05-20 LAB — BASIC METABOLIC PANEL
BUN: 12 mg/dL (ref 6–23)
CO2: 23 mEq/L (ref 19–32)
Calcium: 7.4 mg/dL — ABNORMAL LOW (ref 8.4–10.5)
Chloride: 113 mEq/L — ABNORMAL HIGH (ref 96–112)
GFR calc Af Amer: >90 mL/min (ref >90)
GFR calc non Af Amer: >90 mL/min (ref >90)
Potassium: 4.5 mEq/L (ref 3.5–5.1)
Sodium: 142 mEq/L (ref 135–145)

## 2013-05-20 LAB — GLUCOSE, CAPILLARY
Glucose-Capillary: 95 mg/dL (ref 70–99)
Glucose-Capillary: 176 mg/dL — ABNORMAL HIGH (ref 70–99)
Glucose-Capillary: 118 mg/dL — ABNORMAL HIGH (ref 70–99)

## 2013-05-20 LAB — CBC
HCT: 27.2 % — ABNORMAL LOW (ref 39.0–52.0)
Hemoglobin: 8.8 g/dL — ABNORMAL LOW (ref 13.0–17.0)
MCHC: 32.4 g/dL (ref 30.0–36.0)
Platelets: 133 10*3/uL — ABNORMAL LOW (ref 150–400)
RBC: 2.98 MIL/uL — ABNORMAL LOW (ref 4.22–5.81)
WBC: 6.9 10*3/uL (ref 4.0–10.5)

## 2013-05-20 MED ORDER — CIPROFLOXACIN HCL 500 MG PO TABS
500.0000 mg | ORAL_TABLET | Freq: Two times a day (BID) | ORAL | Status: DC
  Administered 2013-05-20 – 2013-05-24 (×8): 500 mg via ORAL
  Filled 2013-05-20 (×10): qty 1

## 2013-05-20 NOTE — Progress Notes (Signed)
Subjective: Patient doing well this afternoon.  Appetite good.     Objective: Vital signs in last 24 hours: Temp:  [97.8 F (36.6 C)-98.3 F (36.8 C)] 98.3 F (36.8 C) (10/03 1415) Pulse Rate:  [71-84] 84 (10/03 1415) Resp:  [16-18] 18 (10/03 1415) BP: (119-134)/(57-76) 134/68 mmHg (10/03 1415) SpO2:  [97 %-100 %] 100 % (10/03 1415)  Intake/Output from previous day: 10/02 0701 - 10/03 0700 In: 360 [P.O.:360] Out: 1750 [Urine:1750] Intake/Output this shift: Total I/O In: 960 [P.O.:960] Out: 600 [Urine:600]   Recent Labs  05/17/13 1633 05/18/13 0518 05/19/13 0530 05/20/13 0545  HGB 9.2* 9.8* 9.7* 8.8*    Recent Labs  05/19/13 0530 05/20/13 0545  WBC 9.1 6.9  RBC 3.28* 2.98*  HCT 30.0* 27.2*  PLT 148* 133*    Recent Labs  05/19/13 0530 05/20/13 0545  NA 144 142  K 3.3* 4.5  CL 114* 113*  CO2 21 23  BUN 12 12  CREATININE 0.52 0.58  GLUCOSE 75 94  CALCIUM 7.9* 7.4*    Recent Labs  05/17/13 1633  INR 1.82*    Exam:  Back wound looks good.  No drainage or signs of infection.  Neurologically intact.  Alert and oriented.  Plan:  Awaiting snf placement.  Anticipate transfer Monday if bed available and medically stable.  Spoke with Dr Ninetta Lights (infectious disease) and we will d/c IV abx and start cipro 500mg  po bid X 4 weeks.  Dr hatcher wants to see patient in a few weeks.      [name] [name] M 05/20/2013, 3:59 PM

## 2013-05-20 NOTE — Progress Notes (Signed)
ANTIBIOTIC CONSULT NOTE - Follow Up Consult  Pharmacy Consult for cefepime Indication: lumbo-sacral osteo  No Known Allergies  Patient Measurements: Height: 5\' 10"  (177.8 cm) Weight: 185 lb 10 oz (84.2 kg) IBW/kg (Calculated) : 73   Vital Signs: Temp: 98.2 F (36.8 C) (10/03 0555) BP: 124/57 mmHg (10/03 0555) Pulse Rate: 71 (10/03 0555) Intake/Output from previous day: 10/02 0701 - 10/03 0700 In: 360 [P.O.:360] Out: 1750 [Urine:1750] Intake/Output from this shift:    Labs:  Recent Labs  05/18/13 0518 05/19/13 0530 05/20/13 0545  WBC 9.0 9.1 6.9  HGB 9.8* 9.7* 8.8*  PLT 144* 148* 133*  CREATININE 0.48* 0.52 0.58   Estimated Creatinine Clearance: 81.1 ml/min (by C-G formula based on Cr of 0.58).   Microbiology: Recent Results (from the past 720 hour(s))  URINE CULTURE     Status: None   Collection Time    04/25/13 11:00 AM      Result Value Range Status   Specimen Description URINE, RANDOM   Final   Special Requests Normal   Final   Culture  Setup Time     Final   Value: 04/26/2013 03:03     Performed at [facility] Count     Final   Value: NO GROWTH     Performed at [facility]   Culture     Final   Value: NO GROWTH     Performed at [facility]   Report Status 04/27/2013 FINAL   Final  SURGICAL PCR SCREEN     Status: Abnormal   Collection Time    05/05/13 12:09 AM      Result Value Range Status   MRSA, PCR POSITIVE (*) NEGATIVE Final   Comment: RESULT CALLED TO, READ BACK BY AND VERIFIED WITH:     REBECCA SPARKS RN [phone] 0300 GREEN R   Staphylococcus aureus POSITIVE (*) NEGATIVE Final   Comment:            The Xpert SA Assay (FDA     approved for NASAL specimens     in patients over 68 years of age),     is one component of     a comprehensive surveillance     program.  Test performance has     been validated by [employer] for patients greater     than or equal to 37 year old.     It is not intended      to diagnose infection nor to     guide or monitor treatment.     RESULT CALLED TO, READ BACK BY AND VERIFIED WITH:     REBECCA SPARKS RN [phone] 0300 GREEN R  URINE CULTURE     Status: None   Collection Time    05/08/13 11:13 PM      Result Value Range Status   Specimen Description URINE, RANDOM   Final   Special Requests NONE   Final   Culture  Setup Time 05/08/2013 23:57   Final   Colony Count >=100,000 COLONIES/ML   Final   Culture VANCOMYCIN RESISTANT ENTEROCOCCUS ISOLATED   Final   Report Status 05/12/2013 FINAL   Final   Organism ID, Bacteria VANCOMYCIN RESISTANT ENTEROCOCCUS ISOLATED   Final  ANAEROBIC CULTURE     Status: None   Collection Time    05/18/13  3:39 PM      Result Value Range Status   Specimen Description WOUND BACK  Final   Special Requests LONGITUDINAL   Final   Gram Stain     Final   Value: NO WBC SEEN     NO SQUAMOUS EPITHELIAL CELLS SEEN     NO ORGANISMS SEEN     Performed at [facility]   Culture     Final   Value: NO ANAEROBES ISOLATED; CULTURE IN PROGRESS FOR 5 DAYS     Performed at [facility]   Report Status PENDING   Incomplete  WOUND CULTURE     Status: None   Collection Time    05/18/13  3:39 PM      Result Value Range Status   Specimen Description WOUND BACK   Final   Special Requests LONGITUDINAL WOUND   Final   Gram Stain     Final   Value: NO WBC SEEN     NO SQUAMOUS EPITHELIAL CELLS SEEN     NO ORGANISMS SEEN     Performed at [facility]   Culture     Final   Value: NO GROWTH 2 DAYS     Performed at [facility]   Report Status PENDING   Incomplete   Assessment: 79 yo M transferred to [facility] from [facility] for wound closure completion and removal of his wound vac.  To continue cefepime through 05/21/2013- stop date entered.  His renal function is stable and dose remains appropriate. WBC are WNL and patient is afebrile.  Goal of Therapy:  Eradicate infection  Plan:  1. Cefepime 2 gm IV q12  through 05/21/13 2. Pharmacy to sign off. Please re-consult if any other needs arise  [name] [name] D. Jamala Kohen, PharmD Clinical Pharmacist Pager: [phone] 05/20/2013 11:09 AM

## 2013-05-20 NOTE — Clinical Social Work Psychosocial (Signed)
Clinical Social Work Department  BRIEF PSYCHOSOCIAL ASSESSMENT  Patient: [name] [name]  Account Number: [id number]   Admit date: 05/17/13 Clinical Social Worker Sabino Niemann, MSW Date/Time: 05/20/2013 3:40 PM Referred by: Physician Date Referred: 10/2-/14- for returning to LTAC Referred for   SNF Placement   Other Referral:  Interview type: Patient- patient requested that CSW speak with his daughter Other interview type: PSYCHOSOCIAL DATA  Living Status:  Admitted from facility: [facility] Level of care: LTAC Primary support name: Micah Flesher Primary support relationship to patient: Daughter Degree of support available:  Strong and vested  CURRENT CONCERNS  Current Concerns   Post-Acute Placement   Other Concerns:  SOCIAL WORK ASSESSMENT / PLAN  CSW met with pt re: PT recommendation for SNF. - Patient asked  CSW to speak with his daughter. Patient did agree to have CSW fax out information  Pt lives with his spouse  CSW explained placement process and answered questions.   Pt reports no preference at this time    CSW completed FL2 and initiated SNF search.     Assessment/plan status: Information/Referral to [facility]  Other assessment/ plan:  Information/referral to community resources:  SNF   PTAR  PATIENT'S/FAMILY'S RESPONSE TO PLAN OF CARE:  Pt  reports he is agreeable to ST SNF in order to increase strength and independence with mobility prior to returning home Patient would like CSW to speak with patient's daughter. CSW left a message for his daughter. Weekend SW will follow up.  Pt verbalized understanding of placement process and appreciation for CSW assist.   Sabino Niemann, MSW [phone]

## 2013-05-20 NOTE — Progress Notes (Addendum)
[facility] PROGRESS NOTE  [name] [name] RUE:[id number] DOB: 02-16-1934 DOA: 05/17/2013 PCP: Provider Not In System  [facility] will sign off. Please contact us for any further assistance. (D/W Dr. Shon Baton)   HPI/Brief narrative 79 year old male with complex past medical history significant for postop wound infection of thoracolumbar spine, DM, chronic kidney disease, recent DVT, hypertension and arthritis was transferred from on 05/17/13 for wound closure by orthopedics. He underwent spinal fusion on 02/14/2013. Unfortunately he developed infection surrounding his hardware. Cultures revealed Serratia. He is being treated with IV cefepime with stop date 05/21/13. Hospitalists were consulted for general medical management postop.  Assessment/Plan:  Postop wound infection (Serratia)/osteomyelitis of thoraco lumbar spine - Management per orthopedics - S/P I&D and delayed wound closure in OR on 10/1 - Continue IV cefepime through 05/21/13  - Discussed with Dr. Shon Baton, orthopedics who plans to consult infectious disease for further recommendations. - DC IV fluids. - Wound cultures are negative to date. - Incentive spirometer for atelectasis.  Type II DM - Good inpatient control. - Continue SSI.  Anemia and thrombocytopenia - Hemoglobin dropped from 9.7 > 8.8 g per DL, possibly secondary to postop acute blood loss and dilutional from IV fluids.  - Patient has had intermittent thrombocytopenia in the past - Follow CBCs closely and transfuse if hemoglobin is less than 7 g per DL.  Hypokalemia - Repleted  Severe malnutrition - per nutrition consultation.  Recent DVT - Superficial right basilic vein 05/03/13 & multiple bilateral lower extremity DVTs 04/24/13 - Status post IVC filter: ? Not candidate for [facility] due to multiple recent back surgeries- defer [facility] decision to Dr. Shon Baton.   BPH - Continue Flomax. - Has condom cath 2  VRE UTI - Treated  Hypertension - Controlled  Chronic kidney  disease - Stable  CAD  - Stable    Fungal rash - Nystatin powder  Confusion/AMS - per primary service, patient has had intermittent confusion for a couple of weeks. - ? Dementia, pain meds, pain etc. - no focal deficits. Monitor.  DVT prophylaxis:  Foot pumps. Management per orthopedics. Lines/catheters:  PIV  Nutrition:  Diabetic  Activity:   per primary service  Code Status: Full Family Communication:  none at bedside.  Disposition Plan:  SNF when bed available. Per CM, cannot return to [facility].     Procedures:   S/P spinal wound I&D and delayed wound closure in OR on 10/1  Antibiotics:   IV cefepime    Subjective: Patient denies complaints. Appears slightly confused this morning.  Objective: Filed Vitals:   05/19/13 1300 05/19/13 1434 05/19/13 2033 05/20/13 0555  BP:  133/61 119/76 124/57  Pulse:  80 75 71  Temp:  98.2 F (36.8 C) 97.8 F (36.6 C) 98.2 F (36.8 C)  TempSrc:  Oral    Resp:  18 16 16   Height: 5\' 10"  (1.778 m)     Weight: 84.2 kg (185 lb 10 oz)     SpO2:  100% 97% 100%    Intake/Output Summary (Last 24 hours) at 05/20/13 1113 Last data filed at 05/20/13 0100  Gross per 24 hour  Intake    240 ml  Output   1350 ml  Net  -1110 ml   Filed Weights   05/19/13 1300  Weight: 84.2 kg (185 lb 10 oz)     Exam:  General exam:  elderly frail male patient sitting propped up in bed having breakfast. Appears comfortable. Respiratory system: reduced breath sounds in the bases & occasional  basal crackles but otherwise clear to auscultation . No increased work of breathing. Cardiovascular system: S1 & S2 heard, RRR. No JVD, murmurs, gallops, clicks or pedal edema. Gastrointestinal system: Abdomen is nondistended, soft and nontender. Normal bowel sounds heard. Central nervous system: Alert and oriented to self and place . No focal neurological deficits. Extremities: Symmetric 5 x 5 power. Slightly edematous upper  extremities. Musculoskeletal system: Mild bruising around the dressing site at lumbar spine. Rest of the dressing appears clean and dry.     Data Reviewed: Basic Metabolic Panel:  Recent Labs Lab 05/15/13 0640 05/17/13 0810 05/17/13 1633 05/18/13 0518 05/19/13 0530 05/20/13 0545  NA 143 142 145 144 144 142  K 3.5 3.1* 4.3 3.2* 3.3* 4.5  CL 114* 112 115* 114* 114* 113*  CO2 22 20 22 21 21 23   GLUCOSE 92 87 113* 99 75 94  BUN 12 8 10 10 12 12   CREATININE 0.45* 0.40* 0.50 0.48* 0.52 0.58  CALCIUM 8.1* 8.1* 8.2* 8.1* 7.9* 7.4*  MG 1.8  --   --   --   --   --   PHOS 2.0*  --   --   --   --   --    Liver Function Tests:  Recent Labs Lab 05/17/13 1633 05/18/13 0518  AST 12 14  ALT 12 13  ALKPHOS 58 61  BILITOT 1.0 1.2  PROT 5.3* 5.6*  ALBUMIN 1.8* 1.9*   No results found for this basename: LIPASE, AMYLASE,  in the last 168 hours No results found for this basename: AMMONIA,  in the last 168 hours CBC:  Recent Labs Lab 05/17/13 0810 05/17/13 1633 05/18/13 0518 05/19/13 0530 05/20/13 0545  WBC 11.7* 9.9 9.0 9.1 6.9  NEUTROABS  --  8.0*  --   --   --   HGB 9.7* 9.2* 9.8* 9.7* 8.8*  HCT 29.8* 28.1* 29.7* 30.0* 27.2*  MCV 89.8 90.1 89.5 91.5 91.3  PLT 151 129* 144* 148* 133*   Cardiac Enzymes: No results found for this basename: CKTOTAL, CKMB, CKMBINDEX, TROPONINI,  in the last 168 hours BNP (last 3 results)  Recent Labs  04/16/13 0600 04/20/13 0500 05/04/13 2046  PROBNP 872.7* 569.7* 1031.0*   CBG:  Recent Labs Lab 05/19/13 0642 05/19/13 1141 05/19/13 1656 05/19/13 2126 05/20/13 0635  GLUCAP 80 108* 128* 181* 95    Recent Results (from the past 240 hour(s))  ANAEROBIC CULTURE     Status: None   Collection Time    05/18/13  3:39 PM      Result Value Range Status   Specimen Description WOUND BACK   Final   Special Requests LONGITUDINAL   Final   Gram Stain     Final   Value: NO WBC SEEN     NO SQUAMOUS EPITHELIAL CELLS SEEN     NO  ORGANISMS SEEN     Performed at [facility]   Culture     Final   Value: NO ANAEROBES ISOLATED; CULTURE IN PROGRESS FOR 5 DAYS     Performed at [facility]   Report Status PENDING   Incomplete  WOUND CULTURE     Status: None   Collection Time    05/18/13  3:39 PM      Result Value Range Status   Specimen Description WOUND BACK   Final   Special Requests LONGITUDINAL WOUND   Final   Gram Stain     Final   Value: NO WBC SEEN  NO SQUAMOUS EPITHELIAL CELLS SEEN     NO ORGANISMS SEEN     Performed at [facility]   Culture     Final   Value: NO GROWTH 2 DAYS     Performed at [facility]   Report Status PENDING   Incomplete      Additional labs: 1.  none      Studies: No results found.      Scheduled Meds: . antiseptic oral rinse  15 mL Mouth Rinse BID  . atorvastatin  10 mg Oral Daily  . brimonidine  1 drop Both Eyes BID  . calcium-vitamin D  1 tablet Oral Q breakfast  . carvedilol  3.125 mg Oral BID WC  . ceFEPime (MAXIPIME) IV  2 g Intravenous Q12H  . cholecalciferol  1,000 Units Oral Daily  . docusate sodium  100 mg Oral BID  . famotidine  20 mg Oral Daily  . feeding supplement  237 mL Oral TID WC  . feeding supplement  30 mL Oral BID  . furosemide  20 mg Oral BID  . insulin aspart  0-5 Units Subcutaneous QHS  . insulin aspart  0-9 Units Subcutaneous TID WC  . lactobacillus  1 g Oral Daily  . multivitamin with minerals  1 tablet Oral Daily  . polyethylene glycol  17 g Oral Daily  . sertraline  50 mg Oral QHS  . tamsulosin  0.4 mg Oral Daily  . vitamin C  2,000 mg Oral Daily  . zinc sulfate  220 mg Oral Daily   Continuous Infusions: . sodium chloride 50 mL/hr at 05/19/13 2142    Principal Problem:   Osteomyelitis of lumbar spine Active Problems:   CAD (coronary artery disease)   DM2 (diabetes mellitus, type 2)   HTN (hypertension)   Hyperlipidemia   Protein-calorie malnutrition, severe   Anemia   Benign  hypertension   DVT (deep venous thrombosis) 05-03-13    Time spent: 25 minutes     [facility]  [facility] Pager [phone].   If 8PM-8AM, please contact night-coverage at [email or web address], password [facility] 05/20/2013, 11:13 AM  LOS: 3 days
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
## 2013-05-20 NOTE — Progress Notes (Signed)
Awaiting PT notes to make discharge disposition recommendations. CSW will continue to follow and will assist with all d/c needs.  Sabino Niemann, MSW, [employer] [phone]

## 2013-05-20 NOTE — Clinical Social Work Placement (Addendum)
Clinical Social Work Department  CLINICAL SOCIAL WORK PLACEMENT NOTE  Patient:Ming Armes Account Number: [id number]  Admit date:05/17/13 Clinical Social Worker: Juliette Mangle Date/time: 05/20/13 11:30  Clinical Social Work is seeking post-discharge placement for this patient at the following level of care: SKILLED NURSING (*CSW will update this form in Epic as items are completed)  Patient/family provided with [facility] Department of Clinical Social Work's list of facilities offering this level of care within the geographic area requested by the patient (or if unable, by the patient's family).  05/20/13 Patient/family informed of their freedom to choose among providers that offer the needed level of care, that participate in Medicare, Medicaid or managed care program needed by the patient, have an available bed and are willing to accept the patient.  05/20/13 Patient/family informed of [facility]' ownership interest in [facility], as well as of the fact that they are under no obligation to receive care at this facility.  PASARR submitted to EDS on Pre-exisitng  PASARR number received from EDS on  FL2 transmitted to all facilities in geographic area requested by pt/family on 05/20/13  FL2 transmitted to all facilities within larger geographic area on  Patient informed that his/her managed care company has contracts with or will negotiate with certain facilities, including the following:  Patient/family informed of bed offers received: 05/23/13 Patient chooses bed at [facility] Physician recommends and patient chooses bed at  Patient to be transferred to on 05/24/2013 Patient to be transferred to facility by [facility] The following physician request were entered in Epic:  Additional Comments:     Sabino Niemann, MSW, [employer] [phone]
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
## 2013-05-21 LAB — BASIC METABOLIC PANEL
Calcium: 7.6 mg/dL — ABNORMAL LOW (ref 8.4–10.5)
Calcium: 7.8 mg/dL — ABNORMAL LOW (ref 8.4–10.5)
Creatinine, Ser: 0.49 mg/dL — ABNORMAL LOW (ref 0.50–1.35)
Creatinine, Ser: 0.46 mg/dL — ABNORMAL LOW (ref 0.50–1.35)
GFR calc Af Amer: >90 mL/min (ref >90)
GFR calc Af Amer: >90 mL/min (ref >90)
GFR calc non Af Amer: >90 mL/min (ref >90)
GFR calc non Af Amer: >90 mL/min (ref >90)
Glucose, Bld: 179 mg/dL — ABNORMAL HIGH (ref 70–99)
Sodium: 144 mEq/L (ref 135–145)
Sodium: 140 mEq/L (ref 135–145)

## 2013-05-21 LAB — GLUCOSE, CAPILLARY: Glucose-Capillary: 87 mg/dL (ref 70–99)

## 2013-05-21 LAB — CBC
MCHC: 32.8 g/dL (ref 30.0–36.0)
MCV: 89.3 fL (ref 78.0–100.0)
Platelets: 124 10*3/uL — ABNORMAL LOW (ref 150–400)
RBC: 3.28 MIL/uL — ABNORMAL LOW (ref 4.22–5.81)
RDW: 18 % — ABNORMAL HIGH (ref 11.5–15.5)
WBC: 7.2 10*3/uL (ref 4.0–10.5)

## 2013-05-21 LAB — WOUND CULTURE

## 2013-05-21 MED ORDER — POTASSIUM CHLORIDE CRYS ER 20 MEQ PO TBCR
40.0000 meq | EXTENDED_RELEASE_TABLET | Freq: Once | ORAL | Status: AC
  Administered 2013-05-21: 40 meq via ORAL
  Filled 2013-05-21: qty 2

## 2013-05-21 NOTE — Progress Notes (Signed)
Subjective: 3 Days Post-Op Procedure(s) (LRB): REMOVAL WOUND VAC, IRRIGATION AND DEBRIDEMENT,  WOUND CLOSURE POSTERIOR THORACOLUMBAR INCISION   (N/A) Patient reports pain as mild.    Objective: Vital signs in last 24 hours: Temp:  [97.9 F (36.6 C)-98.4 F (36.9 C)] 97.9 F (36.6 C) (10/04 0600) Pulse Rate:  [76-84] 82 (10/04 0600) Resp:  [16-18] 16 (10/04 0600) BP: (123-134)/(53-68) 134/53 mmHg (10/04 0600) SpO2:  [100 %] 100 % (10/04 0600)  Intake/Output from previous day: 10/03 0701 - 10/04 0700 In: 960 [P.O.:960] Out: 600 [Urine:600] Intake/Output this shift:     Recent Labs  05/19/13 0530 05/20/13 0545 05/21/13 0553  HGB 9.7* 8.8* 9.6*    Recent Labs  05/20/13 0545 05/21/13 0553  WBC 6.9 7.2  RBC 2.98* 3.28*  HCT 27.2* 29.3*  PLT 133* 124*    Recent Labs  05/20/13 0545 05/21/13 0553  NA 142 144  K 4.5 3.1*  CL 113* 111  CO2 23 25  BUN 12 11  CREATININE 0.58 0.49*  GLUCOSE 94 88  CALCIUM 7.4* 7.6*   No results found for this basename: LABPT, INR,  in the last 72 hours  PE:  elderly male in nad.  a and O.  Wound c/d/i.  No drainage.    Assessment/Plan: 3 Days Post-Op Procedure(s) (LRB): REMOVAL WOUND VAC, IRRIGATION AND DEBRIDEMENT,  WOUND CLOSURE POSTERIOR THORACOLUMBAR INCISION   (N/A) Up with therapy  Continue abx.  Toni Arthurs 05/21/2013, 9:36 AM

## 2013-05-22 LAB — GLUCOSE, CAPILLARY
Glucose-Capillary: 99 mg/dL (ref 70–99)
Glucose-Capillary: 165 mg/dL — ABNORMAL HIGH (ref 70–99)
Glucose-Capillary: 140 mg/dL — ABNORMAL HIGH (ref 70–99)
Glucose-Capillary: 99 mg/dL (ref 70–99)

## 2013-05-22 MED ORDER — ACETAMINOPHEN 325 MG PO TABS
650.0000 mg | ORAL_TABLET | Freq: Four times a day (QID) | ORAL | Status: DC | PRN
  Administered 2013-05-22 – 2013-05-24 (×3): 650 mg via ORAL
  Filled 2013-05-22 (×3): qty 2

## 2013-05-22 NOTE — Progress Notes (Signed)
Orthopedics Progress Note  Subjective: Patient reports back pain  Objective:  Filed Vitals:   05/22/13 0655  BP: 121/59  Pulse: 70  Temp: 98.3 F (36.8 C)  Resp: 16    General: Awake and alert  Musculoskeletal: incision with some maceration around the incision. Mepilex dressing was soaked. Changed to a dry gauze dressing with paper tape to wick away the moisture.  No active drainage, suture line intact Neurovascularly intact  Lab Results  Component Value Date   WBC 7.2 05/21/2013   HGB 9.6* 05/21/2013   HCT 29.3* 05/21/2013   MCV 89.3 05/21/2013   PLT 124* 05/21/2013       Component Value Date/Time   NA 140 05/21/2013 2045   K 3.3* 05/21/2013 2045   CL 108 05/21/2013 2045   CO2 25 05/21/2013 2045   GLUCOSE 179* 05/21/2013 2045   BUN 11 05/21/2013 2045   CREATININE 0.46* 05/21/2013 2045   CREATININE 0.82 03/14/2013 0812   CALCIUM 7.8* 05/21/2013 2045   GFRNONAA >90 05/21/2013 2045   GFRAA >90 05/21/2013 2045    Lab Results  Component Value Date   INR 1.82* 05/17/2013   INR 1.75* 05/04/2013   INR 1.92* 04/16/2013    Assessment/Plan:  s/p Procedure(s): REMOVAL WOUND VAC, IRRIGATION AND DEBRIDEMENT,  WOUND CLOSURE POSTERIOR THORACOLUMBAR INCISION   Stable ortho wise.  Continue supportive care.  D/C planning SNF  Almedia Balls. Ranell Patrick, MD 05/22/2013 11:15 AM

## 2013-05-23 LAB — GLUCOSE, CAPILLARY
Glucose-Capillary: 100 mg/dL — ABNORMAL HIGH (ref 70–99)
Glucose-Capillary: 112 mg/dL — ABNORMAL HIGH (ref 70–99)

## 2013-05-23 LAB — ANAEROBIC CULTURE: Gram Stain: NONE SEEN

## 2013-05-23 NOTE — Progress Notes (Signed)
    Subjective: Procedure(s) (LRB): REMOVAL WOUND VAC, IRRIGATION AND DEBRIDEMENT,  WOUND CLOSURE POSTERIOR THORACOLUMBAR INCISION   (N/A) 5 Days Post-Op  Patient reports pain as 2 on 0-10 scale.  Reports none leg pain reports incisional back pain   Positive void Positive bowel movement Positive flatus Negative chest pain or shortness of breath  Objective: Vital signs in last 24 hours: Temp:  [97.4 F (36.3 C)-98.4 F (36.9 C)] 97.9 F (36.6 C) (10/06 0539) Pulse Rate:  [67-80] 78 (10/06 0852) Resp:  [14-16] 14 (10/06 0539) BP: (127-136)/(63-69) 132/63 mmHg (10/06 0852) SpO2:  [99 %-100 %] 99 % (10/06 0539)  Intake/Output from previous day: 10/05 0701 - 10/06 0700 In: 600 [P.O.:600] Out: 150 [Urine:150]  Labs:  Recent Labs  05/21/13 0553  WBC 7.2  RBC 3.28*  HCT 29.3*  PLT 124*    Recent Labs  05/21/13 0553 05/21/13 2045  NA 144 140  K 3.1* 3.3*  CL 111 108  CO2 25 25  BUN 11 11  CREATININE 0.49* 0.46*  GLUCOSE 88 179*  CALCIUM 7.6* 7.8*   No results found for this basename: LABPT, INR,  in the last 72 hours  Physical Exam: ABD soft Sensation intact distally Intact pulses distally Incision: dressing C/D/I and no drainage Compartment soft  Assessment/Plan: Patient stable  xrays N/A Continue mobilization with physical therapy Continue care  Discharge to SNF Wound ok.  No signs of infection - no fevers/chills, no drainage or erythema Patient had flexi-seal to prevent stool contamination - but was not functioning and was discarded.  Will check wound q2 and change dressings tid and as needed Await SNF placement Oral abx PT/OT eval   Venita Lick, MD [facility] [phone]
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
## 2013-05-23 NOTE — Progress Notes (Signed)
Patient has a bed at [facility] and can discharge when medically stable. CSW will cotinue to follow.  Sabino Niemann, MSW, [employer] [phone]

## 2013-05-24 LAB — GLUCOSE, CAPILLARY
Glucose-Capillary: 100 mg/dL — ABNORMAL HIGH (ref 70–99)
Glucose-Capillary: 168 mg/dL — ABNORMAL HIGH (ref 70–99)

## 2013-05-24 MED ORDER — CIPROFLOXACIN HCL 500 MG PO TABS: 500.0000 mg | ORAL_TABLET | Freq: Two times a day (BID) | ORAL | Status: AC

## 2013-05-24 NOTE — Progress Notes (Signed)
RN Rayfield Citizen attempted to call report to RN at [facility]. Transferred to a an answering machine three times and was unable to get in touch with RN.

## 2013-05-24 NOTE — Discharge Summary (Signed)
Patient ID: [name] [name] MRN: [id number] DOB/AGE: Apr 24, 1934 79 y.o.  Admit date: 05/17/2013 Discharge date: 05/24/2013  Admission Diagnoses:  Principal Problem:   Osteomyelitis of lumbar spine Active Problems:   CAD (coronary artery disease)   DM2 (diabetes mellitus, type 2)   HTN (hypertension)   Hyperlipidemia   Protein-calorie malnutrition, severe   Anemia   Benign hypertension   DVT (deep venous thrombosis) 05-03-13   Discharge Diagnoses:  Principal Problem:   Osteomyelitis of lumbar spine Active Problems:   CAD (coronary artery disease)   DM2 (diabetes mellitus, type 2)   HTN (hypertension)   Hyperlipidemia   Protein-calorie malnutrition, severe   Anemia   Benign hypertension   DVT (deep venous thrombosis) 05-03-13  status post Procedure(s): REMOVAL WOUND VAC, IRRIGATION AND DEBRIDEMENT,  WOUND CLOSURE POSTERIOR THORACOLUMBAR INCISION    Past Medical History  Diagnosis Date  . Hypertension   . Chronic kidney disease     bph  . Arthritis   . Coronary artery disease   . High cholesterol   . Myocardial infarction 2002    "before his lungs or legs" (05/05/2013)  . DVT (deep venous thrombosis)     "after back OR in August" (05/05/2013)  . Type II diabetes mellitus   . History of blood transfusion     "some since OR in 03/2013" (05/05/2013)  . Chronic lower back pain   . Gout   . BPH (benign prostatic hypertrophy)   . Prostate cancer     "S/P radiation tx" ((05/05/2013)  . Skin cancer of face     "had several taken off his face; had his nose cut/reconstructed" (05/05/2013)  . Shortness of breath     Surgeries: Procedure(s): REMOVAL WOUND VAC, IRRIGATION AND DEBRIDEMENT,  WOUND CLOSURE POSTERIOR THORACOLUMBAR INCISION   on 05/17/2013 - 05/18/2013   Consultants:    Discharged Condition: Improved  Hospital Course: [name] [name] is an 79 y.o. male who was admitted 05/17/2013 for operative treatment of Osteomyelitis of lumbar spine. Patient failed  conservative treatments (please see the history and physical for the specifics) and had severe unremitting pain that affects sleep, daily activities and work/hobbies. After pre-op clearance, the patient was taken to the operating room on 05/17/2013 - 05/18/2013 and underwent  Procedure(s): REMOVAL WOUND VAC, IRRIGATION AND DEBRIDEMENT,  WOUND CLOSURE POSTERIOR THORACOLUMBAR INCISION  .    Patient was given perioperative antibiotics: Anti-infectives   Start     Dose/Rate Route Frequency Ordered Stop   05/24/13 0000  ciprofloxacin (CIPRO) 500 MG tablet     500 mg Oral 2 times daily 05/24/13 1312 06/20/13 2359   05/20/13 2000  ciprofloxacin (CIPRO) tablet 500 mg     500 mg Oral 2 times daily 05/20/13 1517     05/17/13 1800  ceFEPIme (MAXIPIME) 2 g in dextrose 5 % 50 mL IVPB  Status:  Discontinued     2 g 100 mL/hr over 30 Minutes Intravenous Every 12 hours 05/17/13 1544 05/20/13 1517       Patient was given sequential compression devices and early ambulation to prevent DVT.   Patient benefited maximally from hospital stay and there were no complications. At the time of discharge, the patient was urinating/moving their bowels without difficulty, tolerating a regular diet, pain is controlled with oral pain medications and they have been cleared by PT/OT.   Recent vital signs: Patient Vitals for the past 24 hrs:  BP Temp Temp src Pulse Resp SpO2  05/24/13 0519 133/42 mmHg 98.3  F (36.8 C) Oral 78 16 100 %  05/23/13 2219 123/61 mmHg 98.5 F (36.9 C) Oral 74 - 97 %  05/23/13 1735 118/55 mmHg - - 81 - -     Recent laboratory studies:  Recent Labs  05/21/13 2045  NA 140  K 3.3*  CL 108  CO2 25  BUN 11  CREATININE 0.46*  GLUCOSE 179*  CALCIUM 7.8*     Discharge Medications:     Medication List    STOP taking these medications       clonazePAM 0.5 MG tablet  Commonly known as:  KLONOPIN     dextrose 5 % SOLN 50 mL with ceFEPIme 2 G SOLR 2 g     dextrose 50 % solution      docusate sodium 100 MG capsule  Commonly known as:  COLACE     guaiFENesin 100 MG/5ML liquid  Commonly known as:  ROBITUSSIN     insulin aspart 100 UNIT/ML injection  Commonly known as:  novoLOG     lactobacillus Pack     methocarbamol 500 MG tablet  Commonly known as:  ROBAXIN     ondansetron 4 MG tablet  Commonly known as:  ZOFRAN     oxycodone 5 MG capsule  Commonly known as:  OXY-IR     polyethylene glycol packet  Commonly known as:  MIRALAX / GLYCOLAX     sodium chloride 0.65 % nasal spray  Commonly known as:  OCEAN     traMADol 50 MG tablet  Commonly known as:  ULTRAM      TAKE these medications       acetaminophen 325 MG tablet  Commonly known as:  TYLENOL  Take 650 mg by mouth every 6 (six) hours as needed for pain.     antiseptic oral rinse Liqd  15 mLs by Mouth Rinse route 2 (two) times daily.     atorvastatin 10 MG tablet  Commonly known as:  LIPITOR  Take 10 mg by mouth daily.     brimonidine 0.2 % ophthalmic solution  Commonly known as:  ALPHAGAN  Place 1 drop into both eyes 2 (two) times daily.     CALCIUM 600+D 600-200 MG-UNIT Tabs  Generic drug:  Calcium Carbonate-Vitamin D  Take 1 tablet by mouth daily.     cholecalciferol 1000 UNITS tablet  Commonly known as:  VITAMIN D  Take 1,000 Units by mouth daily.     ciprofloxacin 500 MG tablet  Commonly known as:  CIPRO  Take 1 tablet (500 mg total) by mouth 2 (two) times daily.     dextrose 40 % Gel  Commonly known as:  GLUTOSE  Take 1 Tube by mouth once as needed (for hypotension).     dronabinol 2.5 MG capsule  Commonly known as:  MARINOL  Take 2.5 mg by mouth 3 (three) times daily.     famotidine 20 MG tablet  Commonly known as:  PEPCID  Take 20 mg by mouth daily.     feeding supplement (PRO-STAT SUGAR FREE 64) Liqd  Take 30 mLs by mouth 2 (two) times daily.     ferrous sulfate 325 (65 FE) MG tablet  Take 325 mg by mouth 2 (two) times daily with a meal.     furosemide 20 MG  tablet  Commonly known as:  LASIX  Take 20 mg by mouth 2 (two) times daily.     GLUCAGEN 1 MG Solr injection  Generic drug:  glucagon  Inject 1  mg into the vein once as needed.     GLUCERNA Liqd  Take 237 mLs by mouth daily.     ipratropium-albuterol 0.5-2.5 (3) MG/3ML Soln  Commonly known as:  DUONEB  Take 3 mLs by nebulization every 6 (six) hours as needed (for shortness of breath).     lisinopril 5 MG tablet  Commonly known as:  PRINIVIL,ZESTRIL  Take 5 mg by mouth daily.     multivitamin with minerals tablet  Take 1 tablet by mouth daily.     potassium chloride 20 MEQ packet  Commonly known as:  KLOR-CON  Take 20 mEq by mouth 2 (two) times daily.     sertraline 50 MG tablet  Commonly known as:  ZOLOFT  Take 50 mg by mouth at bedtime.     tamsulosin 0.4 MG Caps capsule  Commonly known as:  FLOMAX  Take 0.4 mg by mouth daily.     vitamin C 1000 MG tablet  Take 2,000 mg by mouth daily.     zinc sulfate 220 MG capsule  Take 220 mg by mouth daily.        Diagnostic Studies: X-ray Chest Pa And Lateral   05/04/2013   *RADIOLOGY REPORT*  Clinical Data: Preop radiograph  CHEST - 2 VIEW  Comparison: 04/27/2013  Findings: There is a left subclavian catheter with tip in the projection of the SVC.  The patient is status post median sternotomy CABG procedure.  The lung volumes are low.  There are bilateral pleural effusions right greater than left. These appear increased from 04/27/2013.  Atelectasis is noted within both lung bases.  There is mild pulmonary vascular congestion identified. Large hiatal hernia is again identified peri  IMPRESSION:  1. Increased and bilateral pleural effusions and bibasilar atelectasis.   Original Report Authenticated By: Signa Kell, M.D.   Ct Head Wo Contrast  04/26/2013   *RADIOLOGY REPORT*  Clinical Data: Increased confusion.  Back surgery on 03/16/2013.  CT HEAD WITHOUT CONTRAST  Technique:  Contiguous axial images were obtained from the base  of the skull through the vertex without contrast.  Comparison: None.  Findings: The ventricles are normal in size, the ventricles are normal in configuration.  There is ventricular and sulcal enlargement reflecting mild atrophy.  There is a well-defined focus of hypoattenuation in the right centrum semiovale only consistent with a small old lacunar infarct.  A tiny old lacunar infarct is suggested along the medial left caudate nucleus head.  There are patchy areas of predominantly periventricular white matter hypoattenuation, which are most consistent with chronic microvascular ischemic change.  There are no parenchymal masses or mass effect.  There is no evidence of a recent transcortical infarct.  There are no extra-axial masses or abnormal fluid collections.  There is no intracranial hemorrhage.  The sinuses and mastoid air cells are clear.  No skull lesion.  IMPRESSION: No acute intracranial abnormalities.  Mild atrophy and chronic microvascular ischemic change.  Small lacunar infarcts in the right centrum semiovale only and left caudate nucleus head.   Original Report Authenticated By: Amie Portland, M.D.   Dg Chest Port 1 View  05/17/2013   CLINICAL DATA:  Shortness of breath.  EXAM: PORTABLE CHEST - 1 VIEW  COMPARISON:  09/17 and 04/27/2013 and 04/11/2013  FINDINGS: There is persistent cardiomegaly with slight pulmonary vascular congestion. Bibasilar effusions and atelectasis are slightly improved. No acute osseous abnormality.  IMPRESSION: Slight improvement in the vascular congestion and bibasilar atelectasis and effusions.   Electronically Signed  By: Geanie Cooley   On: 05/17/2013 16:12   Dg Chest Port 1 View  04/27/2013   *RADIOLOGY REPORT*  Clinical Data: Dyspnea  PORTABLE CHEST - 1 VIEW  Comparison: 04/22/2013  Findings: Bibasilar heterogeneous opacities unchanged.  Left subclavian central venous catheter unchanged.  No pneumothorax. Low volumes.  Left pleural effusions suspected.  IMPRESSION:  Stable.  Bibasilar airspace disease.  Left effusion.  Low volumes.   Original Report Authenticated By: Jolaine Click, M.D.        Discharge Orders   Future Orders Complete By Expires   PT evaluation  As directed 05/24/2014   Scheduling Instructions:     Mobilization with Brace Ok to apply brace when sitting Ok to remove when in bed or chair      Follow-up Information   Follow up with Alvy Beal, MD. Schedule an appointment as soon as possible for a visit in 1 week.   Specialty:  Orthopedic Surgery   Contact information:   [address] [place] [address] [phone]       Follow up with Johny Sax, MD. Schedule an appointment as soon as possible for a visit in 4 weeks.   Specialty:  Infectious Diseases   Contact information:   [address]  [address] [place] [address] [phone]       Discharge Plan:  discharge to [facility]  Disposition: patient has had long complicated history.  S/p closure of post-op spinal wound infection.  Improving overall. Ok for snf d/c 1. F/u 1 week for wound check 2. Continue Cipro for 4 weeks 3. Non-narcotic pain control 4. Encourage PO intake 5. No active medical issues  6/ f/u in 4 weeks with Dr Ninetta Lights (ID service)    Signed: Venita Lick D for Dr. Venita Lick [facility] [phone] 05/24/2013, 1:14 PM
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
## 2013-05-24 NOTE — Progress Notes (Signed)
Patient is being discharged in stable condition to [facility] [place]. Daughter notified of transfer and patient will go via non-emergent ambulance.

## 2013-05-24 NOTE — Progress Notes (Signed)
    Subjective: Procedure(s) (LRB): REMOVAL WOUND VAC, IRRIGATION AND DEBRIDEMENT,  WOUND CLOSURE POSTERIOR THORACOLUMBAR INCISION   (N/A) 6 Days Post-Op  Patient reports pain as 3 on 0-10 scale.  Reports decreased leg pain reports incisional back pain   Positive void Positive bowel movement Positive flatus Negative chest pain or shortness of breath  Objective: Vital signs in last 24 hours: Temp:  [98.3 F (36.8 C)-98.5 F (36.9 C)] 98.3 F (36.8 C) (10/07 0519) Pulse Rate:  [74-81] 78 (10/07 0519) Resp:  [16] 16 (10/07 0519) BP: (118-133)/(42-61) 133/42 mmHg (10/07 0519) SpO2:  [97 %-100 %] 100 % (10/07 0519)  Intake/Output from previous day: 10/06 0701 - 10/07 0700 In: -  Out: 150 [Urine:150]  Labs: No results found for this basename: WBC, RBC, HCT, PLT,  in the last 72 hours  Recent Labs  05/21/13 2045  NA 140  K 3.3*  CL 108  CO2 25  BUN 11  CREATININE 0.46*  GLUCOSE 179*  CALCIUM 7.8*   No results found for this basename: LABPT, INR,  in the last 72 hours  Physical Exam: Neurologically intact Intact pulses distally Incision: dressing C/D/I and no drainage Compartment soft  Assessment/Plan: Patient stable  xrays n/a   Continue mobilization with physical therapy Continue care  Discharge to SNF Patietn cleared medically for d/c  Will f/u with me in 1 week for wound check Continue oral abx as instructed by ID service  Venita Lick, MD [facility] [phone]
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
## 2013-05-24 NOTE — Progress Notes (Signed)
Clinical social worker assisted with patient discharge to skilled nursing facility, [facility].  CSW addressed all family questions and concerns. CSW copied chart and added all important documents. CSW also set up patient transportation with [facility]. Clinical Social Worker will sign off for now as social work intervention is no longer needed.  [name], MSW, LCSWA [phone] 

## 2013-05-25 ENCOUNTER — Encounter: Payer: Self-pay | Admitting: Nurse Practitioner

## 2013-05-25 ENCOUNTER — Non-Acute Institutional Stay (SKILLED_NURSING_FACILITY): Payer: Medicare Other | Admitting: Nurse Practitioner

## 2013-05-25 DIAGNOSIS — E789 Disorder of lipoprotein metabolism, unspecified: Secondary | ICD-10-CM

## 2013-05-25 DIAGNOSIS — R197 Diarrhea, unspecified: Secondary | ICD-10-CM

## 2013-05-25 DIAGNOSIS — E43 Unspecified severe protein-calorie malnutrition: Secondary | ICD-10-CM

## 2013-05-25 DIAGNOSIS — E119 Type 2 diabetes mellitus without complications: Secondary | ICD-10-CM

## 2013-05-25 DIAGNOSIS — I10 Essential (primary) hypertension: Secondary | ICD-10-CM

## 2013-06-03 ENCOUNTER — Other Ambulatory Visit: Payer: Self-pay | Admitting: *Deleted

## 2013-06-03 ENCOUNTER — Non-Acute Institutional Stay (SKILLED_NURSING_FACILITY): Payer: Medicare Other | Admitting: Internal Medicine

## 2013-06-03 DIAGNOSIS — A0472 Enterocolitis due to Clostridium difficile, not specified as recurrent: Secondary | ICD-10-CM | POA: Insufficient documentation

## 2013-06-03 DIAGNOSIS — E43 Unspecified severe protein-calorie malnutrition: Secondary | ICD-10-CM

## 2013-06-03 DIAGNOSIS — R5381 Other malaise: Secondary | ICD-10-CM

## 2013-06-03 DIAGNOSIS — R52 Pain, unspecified: Secondary | ICD-10-CM

## 2013-06-03 DIAGNOSIS — M545 Low back pain, unspecified: Secondary | ICD-10-CM

## 2013-06-03 DIAGNOSIS — G8929 Other chronic pain: Secondary | ICD-10-CM

## 2013-06-03 DIAGNOSIS — M869 Osteomyelitis, unspecified: Secondary | ICD-10-CM

## 2013-06-03 DIAGNOSIS — W19XXXD Unspecified fall, subsequent encounter: Secondary | ICD-10-CM

## 2013-06-03 DIAGNOSIS — W19XXXA Unspecified fall, initial encounter: Secondary | ICD-10-CM

## 2013-06-03 DIAGNOSIS — M4626 Osteomyelitis of vertebra, lumbar region: Secondary | ICD-10-CM

## 2013-06-03 DIAGNOSIS — E876 Hypokalemia: Secondary | ICD-10-CM

## 2013-06-03 MED ORDER — TRAMADOL HCL 50 MG PO TABS
ORAL_TABLET | ORAL | Status: AC
Start: 2013-06-03 — End: ?

## 2013-06-03 NOTE — Telephone Encounter (Signed)
rx refilled per protocol  

## 2013-06-03 NOTE — Progress Notes (Signed)
Patient ID: [name] [name], male   DOB: 1933/10/21, 79 y.o.   MRN: [id number]  [facility]  Chief Complaint  Patient presents with  . Acute Visit    worsening weakness, back pain, diarrhea   Code status- full code  No Known Allergies  HPI 79 y/o male patient with recent hospital admission for osteomyelitis of lumbar spine is here to undergo rehab due to severe deconditioning. He has now been diagnosed with c.diff colitis and was put on flagyl. He did not have any improvement and was switched to po vancomycin yesterday. He was asked to be seen today due to poor po intake and confusion. He was seen in his room. He is alert and oriented to person today. He complaints of back pain grading it as 7-8/10 at present. He had undergone I/D of the lumbar spine area in hospital. He denies radiation of the pain. He complaints of soreness in his stomach. Denies any abdominal cramps. No nausea or vomiting. He feels weak and tired  ROS No fever or chills Denies chest pain and dyspnea Denies any focal weakness Denies lightheadedness or dizziness Denies blurry vision  Past Medical History  Diagnosis Date  . Hypertension   . Chronic kidney disease     bph  . Arthritis   . Coronary artery disease   . High cholesterol   . Myocardial infarction 2002    "before his lungs or legs" (05/05/2013)  . DVT (deep venous thrombosis)     "after back OR in August" (05/05/2013)  . Type II diabetes mellitus   . History of blood transfusion     "some since OR in 03/2013" (05/05/2013)  . Chronic lower back pain   . Gout   . BPH (benign prostatic hypertrophy)   . Prostate cancer     "S/P radiation tx" ((05/05/2013)  . Skin cancer of face     "had several taken off his face; had his nose cut/reconstructed" (05/05/2013)  . Shortness of breath    Medication reviewed. See [facility]  Physical exam  BP 148/86  Pulse 88  Temp(Src) 97.3 F (36.3 C)  Resp 16  SpO2 95%  gen- elderly male, frail, in NAD HEENT- no  pallor, no icterus, no LAD, dry mucus membrane cvs- normal s1,s2, rrr respi-CTAB, no wheeze or crackles or rhonchi abdo- bowel sound present, lower quadrant tenderness on exam but no guarding or rigidity, no distension, no CVA tenderness, no suprapubic tenderness Ext- able to move all 4, weakness evident, dressing in the back in place, dressing clean and dry Neuro- alert and oriented to person, no new focal deficit noted Psych- flat affect  Labs reviewed  06/02/13 wbc 8.5, hb 11.2, hct 36.7, plt 179, na 143, k 3.3, cl 106, bun 16, cr 0.6, glu 172 05/26/13 c.diff toxin positive 05/25/13 wbc 7.8, hb 9.7, hct 31.1, plt 156, na 142, k 3.6, bun 14, cr 0.5  Assessment/plan  Severe deconditioning- in setting of recent osteomyelitis and further new onset c.diff. Will have him on liquid diet for now and advance diet as tolerated. Encourage po intake. Strict aspiration precautions for now. Continue marinol to help stimulate appetite. Skin care, fall precautions and encourage OOB. To work with physical and occupational therapy  C.diff colitis- tolerating vancomycin well. Continue 2 weeks of the course, on contact precautions. Diarrhea has improved as per staff. Advance diet as tolerated  Fall- his severe deconditoning and AMS in settng of infection could have contributed to this. Fall precautions. To work with PT  and OT to help with his severe debility  Protein malnutrition- with recent infection, weight loss and poor po intake all contributing to this. Has muscle wasting. Continue marinol to help stimulate his appetite. Monitor weight and po intake. Encourage po intake. Advance diet as tolerated. Will continue glucerna supplement  Hypokalemia- With diarrhea this could worsen. Will increase kcl to 40 meq bid for now and recheck bmp in 3 days  Back pain- acute on chronic in setting of recent I/D for spine osteomyelitis. Continue ciprofloxacin. Will have him on tramadol 50 mg bid and additional 50 mg q8 h  prn for breakthrough pain. Will titrate medication further if needed. Encouraged to be out of bed as tolerated  Osteomyelitis- complete course of ciprofloxacin, wound care with dressing change and skin care.

## 2013-06-13 ENCOUNTER — Encounter: Payer: Self-pay | Admitting: Nurse Practitioner

## 2013-06-13 ENCOUNTER — Emergency Department (HOSPITAL_COMMUNITY): Payer: Medicare Other

## 2013-06-13 ENCOUNTER — Non-Acute Institutional Stay (SKILLED_NURSING_FACILITY): Payer: Medicare Other | Admitting: Nurse Practitioner

## 2013-06-13 ENCOUNTER — Encounter (HOSPITAL_COMMUNITY): Payer: Self-pay | Admitting: Emergency Medicine

## 2013-06-13 ENCOUNTER — Inpatient Hospital Stay (HOSPITAL_COMMUNITY)
Admission: EM | Admit: 2013-06-13 | Discharge: 2013-06-21 | DRG: 371 | Disposition: A | Discharge status: Skilled Nursing Facility | Payer: Medicare Other | Attending: Internal Medicine | Admitting: Internal Medicine

## 2013-06-13 DIAGNOSIS — R410 Disorientation, unspecified: Secondary | ICD-10-CM | POA: Diagnosis not present

## 2013-06-13 DIAGNOSIS — E119 Type 2 diabetes mellitus without complications: Secondary | ICD-10-CM

## 2013-06-13 DIAGNOSIS — E785 Hyperlipidemia, unspecified: Secondary | ICD-10-CM | POA: Diagnosis present

## 2013-06-13 DIAGNOSIS — A419 Sepsis, unspecified organism: Secondary | ICD-10-CM | POA: Diagnosis present

## 2013-06-13 DIAGNOSIS — J9819 Other pulmonary collapse: Secondary | ICD-10-CM | POA: Diagnosis present

## 2013-06-13 DIAGNOSIS — IMO0002 Reserved for concepts with insufficient information to code with codable children: Secondary | ICD-10-CM

## 2013-06-13 DIAGNOSIS — Z8619 Personal history of other infectious and parasitic diseases: Secondary | ICD-10-CM | POA: Diagnosis present

## 2013-06-13 DIAGNOSIS — A0472 Enterocolitis due to Clostridium difficile, not specified as recurrent: Principal | ICD-10-CM | POA: Diagnosis present

## 2013-06-13 DIAGNOSIS — E860 Dehydration: Secondary | ICD-10-CM

## 2013-06-13 DIAGNOSIS — E46 Unspecified protein-calorie malnutrition: Secondary | ICD-10-CM

## 2013-06-13 DIAGNOSIS — F05 Delirium due to known physiological condition: Secondary | ICD-10-CM

## 2013-06-13 DIAGNOSIS — N189 Chronic kidney disease, unspecified: Secondary | ICD-10-CM | POA: Diagnosis present

## 2013-06-13 DIAGNOSIS — Z8546 Personal history of malignant neoplasm of prostate: Secondary | ICD-10-CM

## 2013-06-13 DIAGNOSIS — I129 Hypertensive chronic kidney disease with stage 1 through stage 4 chronic kidney disease, or unspecified chronic kidney disease: Secondary | ICD-10-CM | POA: Diagnosis present

## 2013-06-13 DIAGNOSIS — E870 Hyperosmolality and hypernatremia: Secondary | ICD-10-CM | POA: Diagnosis present

## 2013-06-13 DIAGNOSIS — Z951 Presence of aortocoronary bypass graft: Secondary | ICD-10-CM

## 2013-06-13 DIAGNOSIS — Z79899 Other long term (current) drug therapy: Secondary | ICD-10-CM

## 2013-06-13 DIAGNOSIS — E43 Unspecified severe protein-calorie malnutrition: Secondary | ICD-10-CM

## 2013-06-13 DIAGNOSIS — R634 Abnormal weight loss: Secondary | ICD-10-CM

## 2013-06-13 DIAGNOSIS — R4189 Other symptoms and signs involving cognitive functions and awareness: Secondary | ICD-10-CM

## 2013-06-13 DIAGNOSIS — E1169 Type 2 diabetes mellitus with other specified complication: Secondary | ICD-10-CM | POA: Diagnosis present

## 2013-06-13 DIAGNOSIS — M4626 Osteomyelitis of vertebra, lumbar region: Secondary | ICD-10-CM

## 2013-06-13 DIAGNOSIS — R571 Hypovolemic shock: Secondary | ICD-10-CM | POA: Diagnosis present

## 2013-06-13 DIAGNOSIS — Z22322 Carrier or suspected carrier of Methicillin resistant Staphylococcus aureus: Secondary | ICD-10-CM

## 2013-06-13 DIAGNOSIS — Z85828 Personal history of other malignant neoplasm of skin: Secondary | ICD-10-CM

## 2013-06-13 DIAGNOSIS — L0390 Cellulitis, unspecified: Secondary | ICD-10-CM

## 2013-06-13 DIAGNOSIS — G9340 Encephalopathy, unspecified: Secondary | ICD-10-CM | POA: Diagnosis present

## 2013-06-13 DIAGNOSIS — R479 Unspecified speech disturbances: Secondary | ICD-10-CM | POA: Diagnosis present

## 2013-06-13 DIAGNOSIS — I959 Hypotension, unspecified: Secondary | ICD-10-CM

## 2013-06-13 DIAGNOSIS — Z923 Personal history of irradiation: Secondary | ICD-10-CM

## 2013-06-13 DIAGNOSIS — I82409 Acute embolism and thrombosis of unspecified deep veins of unspecified lower extremity: Secondary | ICD-10-CM

## 2013-06-13 DIAGNOSIS — D649 Anemia, unspecified: Secondary | ICD-10-CM

## 2013-06-13 DIAGNOSIS — M109 Gout, unspecified: Secondary | ICD-10-CM | POA: Diagnosis present

## 2013-06-13 DIAGNOSIS — E876 Hypokalemia: Secondary | ICD-10-CM | POA: Diagnosis present

## 2013-06-13 DIAGNOSIS — Z981 Arthrodesis status: Secondary | ICD-10-CM

## 2013-06-13 DIAGNOSIS — L0291 Cutaneous abscess, unspecified: Secondary | ICD-10-CM

## 2013-06-13 DIAGNOSIS — Z86718 Personal history of other venous thrombosis and embolism: Secondary | ICD-10-CM

## 2013-06-13 DIAGNOSIS — R5383 Other fatigue: Secondary | ICD-10-CM

## 2013-06-13 DIAGNOSIS — I252 Old myocardial infarction: Secondary | ICD-10-CM

## 2013-06-13 DIAGNOSIS — I2510 Atherosclerotic heart disease of native coronary artery without angina pectoris: Secondary | ICD-10-CM

## 2013-06-13 DIAGNOSIS — M869 Osteomyelitis, unspecified: Secondary | ICD-10-CM | POA: Diagnosis present

## 2013-06-13 DIAGNOSIS — R5381 Other malaise: Secondary | ICD-10-CM | POA: Diagnosis present

## 2013-06-13 DIAGNOSIS — I10 Essential (primary) hypertension: Secondary | ICD-10-CM

## 2013-06-13 DIAGNOSIS — R578 Other shock: Secondary | ICD-10-CM

## 2013-06-13 DIAGNOSIS — D72829 Elevated white blood cell count, unspecified: Secondary | ICD-10-CM | POA: Diagnosis present

## 2013-06-13 DIAGNOSIS — M4620 Osteomyelitis of vertebra, site unspecified: Secondary | ICD-10-CM

## 2013-06-13 DIAGNOSIS — R4789 Other speech disturbances: Secondary | ICD-10-CM

## 2013-06-13 DIAGNOSIS — M9080 Osteopathy in diseases classified elsewhere, unspecified site: Secondary | ICD-10-CM | POA: Diagnosis present

## 2013-06-13 DIAGNOSIS — Z8614 Personal history of Methicillin resistant Staphylococcus aureus infection: Secondary | ICD-10-CM

## 2013-06-13 LAB — RAPID URINE DRUG SCREEN, HOSP PERFORMED
Barbiturates: NOT DETECTED
Benzodiazepines: NOT DETECTED
Cocaine: NOT DETECTED
Opiates: NOT DETECTED

## 2013-06-13 LAB — URINALYSIS, ROUTINE W REFLEX MICROSCOPIC
Bilirubin Urine: NEGATIVE
Glucose, UA: NEGATIVE mg/dL
Ketones, ur: NEGATIVE mg/dL
Nitrite: NEGATIVE
Specific Gravity, Urine: 1.02 (ref 1.005–1.030)
pH: 6 (ref 5.0–8.0)

## 2013-06-13 LAB — DIFFERENTIAL
Eosinophils Absolute: 0.1 10*3/uL (ref 0.0–0.7)
Eosinophils Relative: 1 % (ref 0–5)
Lymphs Abs: 0.8 10*3/uL (ref 0.7–4.0)
Monocytes Absolute: 0.5 10*3/uL (ref 0.1–1.0)
Monocytes Relative: 6 % (ref 3–12)
Neutro Abs: 6.6 10*3/uL (ref 1.7–7.7)

## 2013-06-13 LAB — COMPREHENSIVE METABOLIC PANEL
ALT: 16 U/L (ref 0–53)
BUN: 9 mg/dL (ref 6–23)
Calcium: 8.7 mg/dL (ref 8.4–10.5)
Creatinine, Ser: 0.84 mg/dL (ref 0.50–1.35)
GFR calc Af Amer: >90 mL/min (ref >90)
GFR calc non Af Amer: 83 mL/min — ABNORMAL LOW (ref >90)
Glucose, Bld: 107 mg/dL — ABNORMAL HIGH (ref 70–99)
Potassium: 3.3 mEq/L — ABNORMAL LOW (ref 3.5–5.1)
Sodium: 142 mEq/L (ref 135–145)
Total Protein: 6.1 g/dL (ref 6.0–8.3)

## 2013-06-13 LAB — URINE MICROSCOPIC-ADD ON

## 2013-06-13 LAB — POCT I-STAT, CHEM 8
BUN: 8 mg/dL (ref 6–23)
Calcium, Ion: 1.2 mmol/L (ref 1.13–1.30)
Chloride: 102 mEq/L (ref 96–112)
Creatinine, Ser: 1.1 mg/dL (ref 0.50–1.35)
TCO2: 27 mmol/L (ref 0–100)

## 2013-06-13 LAB — PROTIME-INR
INR: 1.66 — ABNORMAL HIGH (ref 0.00–1.49)
Prothrombin Time: 19.1 seconds — ABNORMAL HIGH (ref 11.6–15.2)

## 2013-06-13 LAB — CBC
HCT: 34.9 % — ABNORMAL LOW (ref 39.0–52.0)
MCH: 28.8 pg (ref 26.0–34.0)
MCV: 89 fL (ref 78.0–100.0)
Platelets: 194 10*3/uL (ref 150–400)
RBC: 3.92 MIL/uL — ABNORMAL LOW (ref 4.22–5.81)
RDW: 16.1 % — ABNORMAL HIGH (ref 11.5–15.5)

## 2013-06-13 LAB — ETHANOL: Alcohol, Ethyl (B): <11 mg/dL (ref 0–11)

## 2013-06-13 LAB — APTT: aPTT: 32 seconds (ref 24–37)

## 2013-06-13 LAB — MRSA PCR SCREENING: MRSA by PCR: POSITIVE — AB

## 2013-06-13 LAB — POCT I-STAT TROPONIN I: Troponin i, poc: 0 ng/mL (ref 0.00–0.08)

## 2013-06-13 LAB — TROPONIN I: Troponin I: <0.3 ng/mL (ref <0.30)

## 2013-06-13 MED ORDER — ZINC OXIDE 11.3 % EX CREA
1.0000 "application " | TOPICAL_CREAM | Freq: Every day | CUTANEOUS | Status: DC | PRN
  Filled 2013-06-13: qty 56

## 2013-06-13 MED ORDER — ATORVASTATIN CALCIUM 10 MG PO TABS
10.0000 mg | ORAL_TABLET | Freq: Every day | ORAL | Status: DC
  Administered 2013-06-14 – 2013-06-20 (×7): 10 mg via ORAL
  Filled 2013-06-13 (×8): qty 1

## 2013-06-13 MED ORDER — DEXTROSE-NACL 5-0.9 % IV SOLN
INTRAVENOUS | Status: DC
  Administered 2013-06-13 – 2013-06-15 (×5): via INTRAVENOUS
  Administered 2013-06-16: 75 mL/h via INTRAVENOUS

## 2013-06-13 MED ORDER — GLUCERNA PO LIQD: 237.0000 mL | Freq: Every day | ORAL | Status: DC

## 2013-06-13 MED ORDER — SODIUM CHLORIDE 0.9 % IV BOLUS (SEPSIS)
1000.0000 mL | Freq: Once | INTRAVENOUS | Status: AC
  Administered 2013-06-13: 1000 mL via INTRAVENOUS

## 2013-06-13 MED ORDER — ACETAMINOPHEN 325 MG PO TABS
650.0000 mg | ORAL_TABLET | Freq: Four times a day (QID) | ORAL | Status: DC | PRN
  Administered 2013-06-18: 650 mg via ORAL
  Filled 2013-06-13 (×2): qty 2

## 2013-06-13 MED ORDER — POTASSIUM CHLORIDE 10 MEQ/100ML IV SOLN
10.0000 meq | INTRAVENOUS | Status: AC
  Administered 2013-06-13: 10 meq via INTRAVENOUS
  Filled 2013-06-13 (×2): qty 100

## 2013-06-13 MED ORDER — FERROUS SULFATE 325 (65 FE) MG PO TABS
325.0000 mg | ORAL_TABLET | Freq: Two times a day (BID) | ORAL | Status: DC
  Administered 2013-06-14 – 2013-06-21 (×14): 325 mg via ORAL
  Filled 2013-06-13 (×18): qty 1

## 2013-06-13 MED ORDER — FIRST-VANCOMYCIN 50 50 MG/ML PO SOLN: 125.0000 mg | Freq: Four times a day (QID) | ORAL | Status: DC

## 2013-06-13 MED ORDER — IPRATROPIUM BROMIDE 0.02 % IN SOLN: 0.5000 mg | Freq: Four times a day (QID) | RESPIRATORY_TRACT | Status: DC | PRN

## 2013-06-13 MED ORDER — VANCOMYCIN 50 MG/ML ORAL SOLUTION
125.0000 mg | Freq: Four times a day (QID) | ORAL | Status: DC
  Filled 2013-06-13 (×2): qty 2.5

## 2013-06-13 MED ORDER — VANCOMYCIN HCL 1000 MG IV SOLR: 15.0000 mg/kg | Freq: Once | INTRAVENOUS | Status: DC

## 2013-06-13 MED ORDER — PRO-STAT SUGAR FREE PO LIQD
30.0000 mL | Freq: Two times a day (BID) | ORAL | Status: DC
  Administered 2013-06-14 – 2013-06-21 (×12): 30 mL via ORAL
  Filled 2013-06-13 (×16): qty 30

## 2013-06-13 MED ORDER — POTASSIUM CHLORIDE 10 MEQ/100ML IV SOLN
10.0000 meq | Freq: Once | INTRAVENOUS | Status: AC
  Administered 2013-06-13: 10 meq via INTRAVENOUS
  Filled 2013-06-13: qty 100

## 2013-06-13 MED ORDER — SODIUM CHLORIDE 0.9 % IJ SOLN
3.0000 mL | Freq: Two times a day (BID) | INTRAMUSCULAR | Status: DC
  Administered 2013-06-15: 3 mL via INTRAVENOUS

## 2013-06-13 MED ORDER — ONDANSETRON HCL 4 MG/2ML IJ SOLN
4.0000 mg | Freq: Four times a day (QID) | INTRAMUSCULAR | Status: DC | PRN
  Administered 2013-06-21: 4 mg via INTRAVENOUS
  Filled 2013-06-13: qty 2

## 2013-06-13 MED ORDER — ONDANSETRON HCL 4 MG PO TABS: 4.0000 mg | ORAL_TABLET | Freq: Four times a day (QID) | ORAL | Status: DC | PRN

## 2013-06-13 MED ORDER — VANCOMYCIN HCL 10 G IV SOLR
1250.0000 mg | Freq: Once | INTRAVENOUS | Status: AC
  Administered 2013-06-13: 1250 mg via INTRAVENOUS
  Filled 2013-06-13: qty 1250

## 2013-06-13 MED ORDER — ALBUTEROL SULFATE (5 MG/ML) 0.5% IN NEBU
2.5000 mg | INHALATION_SOLUTION | Freq: Four times a day (QID) | RESPIRATORY_TRACT | Status: DC | PRN
Start: 2013-06-13 — End: 2013-06-21

## 2013-06-13 MED ORDER — CIPROFLOXACIN IN D5W 400 MG/200ML IV SOLN
400.0000 mg | Freq: Two times a day (BID) | INTRAVENOUS | Status: DC
  Administered 2013-06-14 (×2): 400 mg via INTRAVENOUS
  Filled 2013-06-13 (×4): qty 200

## 2013-06-13 MED ORDER — INSULIN ASPART 100 UNIT/ML ~~LOC~~ SOLN: 0.0000 [IU] | Freq: Three times a day (TID) | SUBCUTANEOUS | Status: DC

## 2013-06-13 MED ORDER — CIPROFLOXACIN HCL 500 MG PO TABS
500.0000 mg | ORAL_TABLET | Freq: Two times a day (BID) | ORAL | Status: DC
  Filled 2013-06-13 (×2): qty 1

## 2013-06-13 MED ORDER — SERTRALINE HCL 50 MG PO TABS
50.0000 mg | ORAL_TABLET | Freq: Every day | ORAL | Status: DC
  Administered 2013-06-15 – 2013-06-21 (×7): 50 mg via ORAL
  Filled 2013-06-13 (×9): qty 1

## 2013-06-13 MED ORDER — PIPERACILLIN-TAZOBACTAM 3.375 G IVPB 30 MIN
3.3750 g | Freq: Once | INTRAVENOUS | Status: AC
  Administered 2013-06-13: 3.375 g via INTRAVENOUS
  Filled 2013-06-13: qty 50

## 2013-06-13 MED ORDER — DRONABINOL 2.5 MG PO CAPS
2.5000 mg | ORAL_CAPSULE | Freq: Three times a day (TID) | ORAL | Status: DC
  Administered 2013-06-14 – 2013-06-16 (×5): 2.5 mg via ORAL
  Filled 2013-06-13 (×7): qty 1

## 2013-06-13 MED ORDER — BRIMONIDINE TARTRATE 0.2 % OP SOLN
1.0000 [drp] | Freq: Two times a day (BID) | OPHTHALMIC | Status: DC
  Administered 2013-06-14 – 2013-06-21 (×16): 1 [drp] via OPHTHALMIC
  Filled 2013-06-13: qty 5

## 2013-06-13 MED ORDER — GLUCERNA SHAKE PO LIQD
237.0000 mL | Freq: Every day | ORAL | Status: DC
  Administered 2013-06-15: 237 mL via ORAL

## 2013-06-13 MED ORDER — IPRATROPIUM-ALBUTEROL 0.5-2.5 (3) MG/3ML IN SOLN: 3.0000 mL | Freq: Four times a day (QID) | RESPIRATORY_TRACT | Status: DC | PRN

## 2013-06-13 MED ORDER — METRONIDAZOLE IVPB CUSTOM
250.0000 mg | Freq: Four times a day (QID) | INTRAVENOUS | Status: DC
  Administered 2013-06-14 – 2013-06-15 (×5): 250 mg via INTRAVENOUS
  Filled 2013-06-13 (×9): qty 50

## 2013-06-13 MED ORDER — POTASSIUM CHLORIDE CRYS ER 20 MEQ PO TBCR: 40.0000 meq | EXTENDED_RELEASE_TABLET | Freq: Once | ORAL | Status: DC

## 2013-06-13 MED ORDER — PANTOPRAZOLE SODIUM 40 MG PO TBEC
40.0000 mg | DELAYED_RELEASE_TABLET | Freq: Every day | ORAL | Status: DC
  Administered 2013-06-15 – 2013-06-21 (×7): 40 mg via ORAL
  Filled 2013-06-13 (×6): qty 1

## 2013-06-13 MED ORDER — WHITE PETROLATUM GEL
Status: AC
  Administered 2013-06-14: 0.2
  Filled 2013-06-13: qty 5

## 2013-06-13 MED ORDER — HEPARIN SODIUM (PORCINE) 5000 UNIT/ML IJ SOLN
5000.0000 [IU] | Freq: Three times a day (TID) | INTRAMUSCULAR | Status: DC
  Administered 2013-06-14 – 2013-06-21 (×22): 5000 [IU] via SUBCUTANEOUS
  Filled 2013-06-13 (×27): qty 1

## 2013-06-13 MED ORDER — ACETAMINOPHEN 650 MG RE SUPP: 650.0000 mg | Freq: Four times a day (QID) | RECTAL | Status: DC | PRN

## 2013-06-13 NOTE — ED Provider Notes (Signed)
CSN: [id number]     Arrival date & time 06/13/13  1209 History   First MD Initiated Contact with Patient 06/13/13 1217     Chief Complaint  Patient presents with  . Altered Mental Status   (Consider location/radiation/quality/duration/timing/severity/associated sxs/prior Treatment) HPI Comments: 79 year old male presents from the rehabilitation facility with altered mental status. He's been in a rehabilitation facility because of multiple spine surgeries for osteomyelitis and wound irrigation. He is also currently being treated for C. difficile with faint and Flagyl. Per the daughter he was normal yesterday. The nursing home stated that he was normal at 8:30 this morning and when they checked on him 30 minutes prior to arrival he was having trouble speaking and seemed altered. Is also hypotensive into the 70s. The rest of the history is unavailable as the patient is unable to communicate well enough to provide a history.   Past Medical History  Diagnosis Date  . Hypertension   . Chronic kidney disease     bph  . Arthritis   . Coronary artery disease   . High cholesterol   . Myocardial infarction 2002    "before his lungs or legs" (05/05/2013)  . DVT (deep venous thrombosis)     "after back OR in August" (05/05/2013)  . Type II diabetes mellitus   . History of blood transfusion     "some since OR in 03/2013" (05/05/2013)  . Chronic lower back pain   . Gout   . BPH (benign prostatic hypertrophy)   . Prostate cancer     "S/P radiation tx" ((05/05/2013)  . Skin cancer of face     "had several taken off his face; had his nose cut/reconstructed" (05/05/2013)  . Shortness of breath    Past Surgical History  Procedure Laterality Date  . Appendectomy    . Coronary artery bypass graft  11/09/2000    LIMA to LAD, SVG to RPDA, seq SVG to OM2 and D1 by [name] [facility]  . Lumbar fusion  03/15/2013    Dr Shon Baton  . Posterior lumbar fusion 4 level N/A 03/16/2013    Procedure: T9 - L3  POSTERIOR POSTERIOR SPINAL FUSION ;  Surgeon: Venita Lick, MD;  Location: [facility] OR;  Service: Orthopedics;  Laterality: N/A;  . Hardware removal N/A 03/30/2013    Procedure: HARDWARE REMOVAL/IRRIGATION & DEBRIDEMENT OF WOUND ON BACK;  Surgeon: Venita Lick, MD;  Location: [facility] OR;  Service: Orthopedics;  Laterality: N/A;  . Incision and drainage of wound N/A 04/13/2013    Procedure: IRRIGATION AND DEBRIDEMENT WOUND AND VAC DRESSING;  Surgeon: Venita Lick, MD;  Location: [facility] OR;  Service: Orthopedics;  Laterality: N/A;  . Application of wound vac  04/13/2013; 05/04/2013  . Back surgery    . Cataract extraction w/ intraocular lens  implant, bilateral Bilateral   . Secondary closure of wound N/A 05/05/2013    Procedure: WASH-OUT CLOSURE OF WOUND PARTIAL;  Surgeon: Venita Lick, MD;  Location: [facility] OR;  Service: Orthopedics;  Laterality: N/A;  . Incision and drainage of wound N/A 05/18/2013    Procedure: REMOVAL WOUND VAC, IRRIGATION AND DEBRIDEMENT,  WOUND CLOSURE POSTERIOR THORACOLUMBAR INCISION  ;  Surgeon: Venita Lick, MD;  Location: [facility] OR;  Service: Orthopedics;  Laterality: N/A;   Family History  Problem Relation Age of Onset  . Diabetes Mother   . Stroke Brother     x2   History  Substance Use Topics  . Smoking status: Never Smoker   . Smokeless tobacco: Never  Used  . Alcohol Use: Yes     Comment: 05/05/2013 "used to drink a little bit when he was younger"    Review of Systems  Unable to perform ROS: Mental status change    Allergies  Review of patient's allergies indicates no known allergies.  Home Medications   Current Outpatient Rx  Name  Route  Sig  Dispense  Refill  . acetaminophen (TYLENOL) 325 MG tablet   Oral   Take 650 mg by mouth every 6 (six) hours as needed for pain.         [name] antiseptic oral rinse (BIOTENE) LIQD   Mouth Rinse   15 mLs by Mouth Rinse route 2 (two) times daily.         . Ascorbic Acid (VITAMIN C) 1000 MG tablet   Oral   Take 2,000 mg by  mouth daily.         [name] atorvastatin (LIPITOR) 10 MG tablet   Oral   Take 10 mg by mouth daily.         . brimonidine (ALPHAGAN) 0.2 % ophthalmic solution   Both Eyes   Place 1 drop into both eyes 2 (two) times daily.         . Calcium Carbonate-Vitamin D (CALCIUM 600+D) 600-200 MG-UNIT TABS   Oral   Take 1 tablet by mouth daily.         . cholecalciferol (VITAMIN D) 1000 UNITS tablet   Oral   Take 1,000 Units by mouth daily.         . ciprofloxacin (CIPRO) 500 MG tablet   Oral   Take 1 tablet (500 mg total) by mouth 2 (two) times daily.   70 tablet   0   . dextrose (GLUTOSE) 40 % GEL   Oral   Take 1 Tube by mouth once as needed (for hypotension).         [name] dronabinol (MARINOL) 2.5 MG capsule   Oral   Take 2.5 mg by mouth 3 (three) times daily.         . famotidine (PEPCID) 20 MG tablet   Oral   Take 20 mg by mouth daily.         . feeding supplement (PRO-STAT SUGAR FREE 64) LIQD   Oral   Take 30 mLs by mouth 2 (two) times daily.          . ferrous sulfate 325 (65 FE) MG tablet   Oral   Take 325 mg by mouth 2 (two) times daily with a meal.         . furosemide (LASIX) 20 MG tablet   Oral   Take 20 mg by mouth 2 (two) times daily.         [name] glucagon (GLUCAGEN) 1 MG SOLR injection   Intravenous   Inject 1 mg into the vein once as needed.         [name] GLUCERNA (GLUCERNA) LIQD   Oral   Take 237 mLs by mouth daily.         [name] ipratropium-albuterol (DUONEB) 0.5-2.5 (3) MG/3ML SOLN   Nebulization   Take 3 mLs by nebulization every 6 (six) hours as needed (for shortness of breath).         [name] lisinopril (PRINIVIL,ZESTRIL) 5 MG tablet   Oral   Take 5 mg by mouth daily.         . Multiple Vitamins-Minerals (MULTIVITAMIN WITH MINERALS) tablet   Oral   Take 1  tablet by mouth daily.         . potassium chloride (KLOR-CON) 20 MEQ packet   Oral   Take 20 mEq by mouth 2 (two) times daily.         . sertraline (ZOLOFT) 50 MG tablet    Oral   Take 50 mg by mouth at bedtime.         . tamsulosin (FLOMAX) 0.4 MG CAPS   Oral   Take 0.4 mg by mouth daily.         . traMADol (ULTRAM) 50 MG tablet      Take 1 tablet = 50 mg by mouth every 12 hours for pain; take 1 tablet = 50 mg every 8 hours as need for pain.   120 tablet   0   . zinc sulfate 220 MG capsule   Oral   Take 220 mg by mouth daily.          BP 66/42  Temp(Src) 97.8 F (36.6 C) (Oral)  Resp 18  SpO2 92% Physical Exam  Nursing note and vitals reviewed. Constitutional: He appears well-developed and well-nourished.  HENT:  Head: Normocephalic and atraumatic.  Right Ear: External ear normal.  Left Ear: External ear normal.  Nose: Nose normal.  Mouth/Throat: Mucous membranes are dry.  Severely dry mucous membranes  Eyes: Right eye exhibits no discharge. Left eye exhibits no discharge.  Neck: Neck supple.  Cardiovascular: Normal rate, regular rhythm, normal heart sounds and intact distal pulses.   Pulmonary/Chest: Effort normal.  Abdominal: Soft. He exhibits no distension. There is no tenderness.  Musculoskeletal: He exhibits no edema.  Neurological: He is alert. He is disoriented. GCS eye subscore is 4. GCS verbal subscore is 3. GCS motor subscore is 6.  Skin: Skin is warm and dry.    ED Course  Procedures (including critical care time) Labs Review Labs Reviewed  PROTIME-INR - Abnormal; Notable for the following:    Prothrombin Time 19.1 (*)    INR 1.66 (*)    All other components within normal limits  CBC - Abnormal; Notable for the following:    RBC 3.92 (*)    Hemoglobin 11.3 (*)    HCT 34.9 (*)    RDW 16.1 (*)    All other components within normal limits  DIFFERENTIAL - Abnormal; Notable for the following:    Neutrophils Relative % 82 (*)    Lymphocytes Relative 10 (*)    All other components within normal limits  COMPREHENSIVE METABOLIC PANEL - Abnormal; Notable for the following:    Potassium 3.3 (*)    Glucose, Bld 107  (*)    Albumin 2.1 (*)    GFR calc non Af Amer 83 (*)    All other components within normal limits  URINE RAPID DRUG SCREEN (HOSP PERFORMED) - Abnormal; Notable for the following:    Tetrahydrocannabinol POSITIVE (*)    All other components within normal limits  URINALYSIS, ROUTINE W REFLEX MICROSCOPIC - Abnormal; Notable for the following:    Color, Urine AMBER (*)    APPearance CLOUDY (*)    Hgb urine dipstick SMALL (*)    Protein, ur 30 (*)    Leukocytes, UA MODERATE (*)    All other components within normal limits  URINE MICROSCOPIC-ADD ON - Abnormal; Notable for the following:    Bacteria, UA FEW (*)    Casts HYALINE CASTS (*)    All other components within normal limits  POCT I-STAT, CHEM 8 - Abnormal;  Notable for the following:    Sodium 146 (*)    Potassium 3.3 (*)    Glucose, Bld 104 (*)    Hemoglobin 12.2 (*)    HCT 36.0 (*)    All other components within normal limits  CULTURE, BLOOD (ROUTINE X 2)  CULTURE, BLOOD (ROUTINE X 2)  URINE CULTURE  ETHANOL  APTT  TROPONIN I  POCT I-STAT TROPONIN I  CG4 I-STAT (LACTIC ACID)   Imaging Review Ct Head Wo Contrast  06/13/2013   CLINICAL DATA:  Altered mental status. Code stroke.  EXAM: CT HEAD WITHOUT CONTRAST  TECHNIQUE: Contiguous axial images were obtained from the base of the skull through the vertex without intravenous contrast.  COMPARISON:  04/26/2013  FINDINGS: No mass lesion. No midline shift. No acute hemorrhage or hematoma. No extra-axial fluid collections. No evidence of acute infarction. There is diffuse cerebral cortical and cerebellar atrophy with scattered periventricular lacunar infarcts as well as a small lacunar infarct in the central portion of the medulla on image number 8, unchanged. No acute osseous abnormalities.  IMPRESSION: No acute abnormality.  Atrophy and multiple old lacunar infarcts.  Critical Value/emergent results were called by telephone at the time of interpretation on 06/13/2013 at 2:45 PM to  Dr.Reynolds , who verbally acknowledged these results.   Electronically Signed   By: Geanie Cooley M.D.   On: 06/13/2013 14:45   Dg Chest Portable 1 View  06/13/2013   CLINICAL DATA:  Altered mental status. Hypotension.  EXAM: PORTABLE CHEST - 1 VIEW  COMPARISON:  05/17/2013  FINDINGS: There is slight atelectasis at the lung bases, improved. Heart size and vascularity are normal. Large hiatal hernia, unchanged. No acute osseous abnormality.  IMPRESSION: Slight bibasilar atelectasis, improved.   Electronically Signed   By: Geanie Cooley M.D.   On: 06/13/2013 13:27    EKG Interpretation     Ventricular Rate:  86 PR Interval:  191 QRS Duration: 91 QT Interval:  431 QTC Calculation: 516 R Axis:   -36 Text Interpretation:  Sinus rhythm Left axis deviation Prolonged QT interval No significant change since last tracing           CRITICAL CARE Performed by: Pricilla Loveless T   Total critical care time: 45 minutes  Critical care time was exclusive of separately billable procedures and treating other patients.  Critical care was necessary to treat or prevent imminent or life-threatening deterioration.  Critical care was time spent personally by me on the following activities: development of treatment plan with patient and/or surrogate as well as nursing, discussions with consultants, evaluation of patient's response to treatment, examination of patient, obtaining history from patient or surrogate, ordering and performing treatments and interventions, ordering and review of laboratory studies, ordering and review of radiographic studies, pulse oximetry and re-evaluation of patient's condition.  MDM   1. Dehydration   2. Cellulitis   3. Hypovolemic shock   4. Sepsis    Code stroke initially called due to his left sided neglect with trouble speaking within 4 hours of being seen normal. Neuro evaluated and feels that his hypotension and likely infection are causing his symptoms. His  hypotension was amenable to fluids. Critical care evaluated and felt that since his BP stabilized he would be fit for the stepdown. Given his workup a UTI is likely the cause of his hypotension and AMS. Will treat with vanc/zosyn to also cover possible wound source given his multiple back surgeries. Will admit to medicine teaching service.  Audree Camel, MD 06/13/13 [id number]

## 2013-06-13 NOTE — H&P (Signed)
Date: 06/13/2013               Patient Name:  [name] [name] MRN: [id number]  DOB: [birth year] Age / Sex: 79 y.o., male   PCP: Provider Not In System         Medical Service: Internal Medicine Teaching Service         Attending Physician: Dr. Kem Kays    First Contact: Dr. Yetta Barre Pager: [phone]  Second Contact: Dr. Virgina Organ Pager: [phone]       After Hours (After 5p/  First Contact Pager: [phone]  weekends / holidays): Second Contact Pager: [phone]   Chief Complaint: Slurred speech, weakness  History of Present Illness:  [name] is a 79 y.o. 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 History is mostly obtained from chart review as patient was unable to provide a history and family was not present during the interview. He has been going to rehab facility as he was recently discharged on 05/24/13 for admission for lumbar spinal osteomyelitis. His nursing home claims that the patient was normal early this AM, but then became somewhat altered with difficulty speaking. The patient was brought to the ED and was found to be hypotensive to 66/42. A code stroke was initially called, but was then cancelled as the patient was out of the window and did not appear to have features consistent with a stroke. CT head was performed and revealed no acute abnormality. The patient was given 3 L of NS in the ED with adequate BP response to fluids. On interviewing the patient, his BP was 100's/60's-70's. He appeared in no acute distress and was able to answer simple questions. He denies any recent nausea, vomiting, abdominal pain, chest pain, SOB, fever, or chills. Complicated answers were difficult to decipher as the patient had an extremely dry mouth/tongue, contributing to his inability to speak  clearly. He did mention he had some back pain and recent diarrhea. The patient has been admitted several times for post-op wound infection s/p T9-L3 PLIF on 03/16/13, most recently on 05/18/13. His most recent admission was complicated by C. Dif infection for which he was sent home on Vancomycin po, for which he is still taking.   Meds: Current Facility-Administered Medications  Medication Dose Route Frequency Provider Last Rate Last Dose  . potassium chloride 10 mEq in 100 mL IVPB  10 mEq Intravenous Once Audree Camel, MD 100 mL/hr at 06/13/13 1702 10 mEq at 06/13/13 1702   Current Outpatient Prescriptions  Medication Sig Dispense Refill  . acetaminophen (TYLENOL) 325 MG tablet Take 650 mg by mouth every 6 (six) hours as needed for pain.      . Ascorbic Acid (VITAMIN C) 1000 MG tablet Take 2,000 mg by mouth daily.      [name] atorvastatin (LIPITOR) 10 MG tablet Take 10 mg by mouth daily.      [name] bismuth subsalicylate (PEPTO BISMOL) 262 MG chewable tablet Chew 524 mg by mouth every 6 (six) hours as needed for indigestion.      . bismuth subsalicylate (PEPTO BISMOL) 262 MG/15ML suspension Take 30 mLs by mouth every 6 (six) hours as needed for indigestion.      . brimonidine (ALPHAGAN) 0.2 % ophthalmic solution Place 1 drop into both eyes 2 (two)  times daily.      . Calcium Carbonate-Vitamin D (CALCIUM 600+D) 600-200 MG-UNIT TABS Take 1 tablet by mouth daily.      . cholecalciferol (VITAMIN D) 1000 UNITS tablet Take 1,000 Units by mouth daily.      . ciprofloxacin (CIPRO) 500 MG tablet Take 1 tablet (500 mg total) by mouth 2 (two) times daily.  70 tablet  0  . dextrose (GLUTOSE) 40 % GEL Take 1 Tube by mouth once as needed (for hypotension).      [name] dronabinol (MARINOL) 2.5 MG capsule Take 2.5 mg by mouth 3 (three) times daily.      . famotidine (PEPCID) 20 MG tablet Take 20 mg by mouth daily.      . feeding supplement (PRO-STAT SUGAR FREE 64) LIQD Take 30 mLs by mouth 2 (two) times daily.       .  ferrous sulfate 325 (65 FE) MG tablet Take 325 mg by mouth 2 (two) times daily with a meal.      . furosemide (LASIX) 20 MG tablet Take 20 mg by mouth 2 (two) times daily.      [name] glucagon (GLUCAGEN) 1 MG SOLR injection Inject 1 mg into the vein once as needed.      [name] GLUCERNA (GLUCERNA) LIQD Take 237 mLs by mouth daily. Vanilla      . ipratropium-albuterol (DUONEB) 0.5-2.5 (3) MG/3ML SOLN Take 3 mLs by nebulization every 6 (six) hours as needed (for shortness of breath).      [name] lisinopril (PRINIVIL,ZESTRIL) 5 MG tablet Take 5 mg by mouth daily.      . Mouthwashes (LISTERINE AGENT COOLBLUE) LIQD Use as directed 15 mLs in the mouth or throat 2 (two) times daily.      . Multiple Vitamins-Minerals (MULTIVITAMIN WITH MINERALS) tablet Take 1 tablet by mouth daily.      [name] omeprazole (PRILOSEC) 20 MG capsule Take 20 mg by mouth daily.      . potassium chloride (KLOR-CON) 20 MEQ packet Take 20 mEq by mouth daily.       . sertraline (ZOLOFT) 50 MG tablet Take 50 mg by mouth daily.       . tamsulosin (FLOMAX) 0.4 MG CAPS Take 0.4 mg by mouth at bedtime.       . traMADol (ULTRAM) 50 MG tablet Take 1 tablet = 50 mg by mouth every 12 hours for pain; take 1 tablet = 50 mg every 8 hours as need for pain.  120 tablet  0  . Vancomycin HCl (FIRST-VANCOMYCIN 50) 50 MG/ML SOLN Take 125 mg by mouth every 6 (six) hours. For 14 days. Started 06/08/13      . zinc oxide (BALMEX) 11.3 % CREA cream Apply 1 application topically daily as needed (to scrotum).      . zinc sulfate 220 MG capsule Take 220 mg by mouth daily.        Allergies: Allergies as of 06/13/2013  . (No Known Allergies)   Past Medical History  Diagnosis Date  . Hypertension   . Chronic kidney disease     bph  . Arthritis   . Coronary artery disease   . High cholesterol   . Myocardial infarction 2002    "before his lungs or legs" (05/05/2013)  . DVT (deep venous thrombosis)     "after back OR in August" (05/05/2013)  . Type II diabetes mellitus     . History of blood transfusion     "some since OR in 03/2013" (  05/05/2013)  . Chronic lower back pain   . Gout   . BPH (benign prostatic hypertrophy)   . Prostate cancer     "S/P radiation tx" ((05/05/2013)  . Skin cancer of face     "had several taken off his face; had his nose cut/reconstructed" (05/05/2013)  . Shortness of breath    Past Surgical History  Procedure Laterality Date  . Appendectomy    . Coronary artery bypass graft  11/09/2000    LIMA to LAD, SVG to RPDA, seq SVG to OM2 and D1 by [name] [facility]  . Lumbar fusion  03/15/2013    Dr Shon Baton  . Posterior lumbar fusion 4 level N/A 03/16/2013    Procedure: T9 - L3 POSTERIOR POSTERIOR SPINAL FUSION ;  Surgeon: Venita Lick, MD;  Location: [facility] OR;  Service: Orthopedics;  Laterality: N/A;  . Hardware removal N/A 03/30/2013    Procedure: HARDWARE REMOVAL/IRRIGATION & DEBRIDEMENT OF WOUND ON BACK;  Surgeon: Venita Lick, MD;  Location: [facility] OR;  Service: Orthopedics;  Laterality: N/A;  . Incision and drainage of wound N/A 04/13/2013    Procedure: IRRIGATION AND DEBRIDEMENT WOUND AND VAC DRESSING;  Surgeon: Venita Lick, MD;  Location: [facility] OR;  Service: Orthopedics;  Laterality: N/A;  . Application of wound vac  04/13/2013; 05/04/2013  . Back surgery    . Cataract extraction w/ intraocular lens  implant, bilateral Bilateral   . Secondary closure of wound N/A 05/05/2013    Procedure: WASH-OUT CLOSURE OF WOUND PARTIAL;  Surgeon: Venita Lick, MD;  Location: [facility] OR;  Service: Orthopedics;  Laterality: N/A;  . Incision and drainage of wound N/A 05/18/2013    Procedure: REMOVAL WOUND VAC, IRRIGATION AND DEBRIDEMENT,  WOUND CLOSURE POSTERIOR THORACOLUMBAR INCISION  ;  Surgeon: Venita Lick, MD;  Location: [facility] OR;  Service: Orthopedics;  Laterality: N/A;   Family History  Problem Relation Age of Onset  . Diabetes Mother   . Stroke Brother     x2   History   Social History  . Marital Status: Married    Spouse Name: N/A     Number of Children: 2  . Years of Education: N/A   Occupational History  . Not on file.   Social History Main Topics  . Smoking status: Never Smoker   . Smokeless tobacco: Never Used  . Alcohol Use: Yes     Comment: 05/05/2013 "used to drink a little bit when he was younger"  . Drug Use: No  . Sexual Activity: No   Other Topics Concern  . Not on file   Social History Narrative  . No narrative on file    Review of Systems: General: Denies fever, chills, diaphoresis, appetite change and fatigue.  Respiratory: Denies SOB, DOE, cough, chest tightness, and wheezing.   Cardiovascular: Denies chest pain, palpitations and leg swelling.  Gastrointestinal: Positive for diarrhea. Denies nausea, vomiting, abdominal pain, constipation, blood in stool and abdominal distention.  Genitourinary: Denies dysuria, urgency, frequency, hematuria, flank pain and difficulty urinating.  Endocrine: Denies hot or cold intolerance, sweats, polyuria, polydipsia. Musculoskeletal: Positive for back pain. Denies myalgias, joint swelling, arthralgias and gait problem.  Skin: Denies pallor, rash and wounds.  Neurological: Positive for weakness, slurred speech. Denies dizziness, seizures, syncope,  lightheadedness, numbness and headaches.  Psychiatric/Behavioral: Denies mood changes, confusion, nervousness, sleep disturbance and agitation.  Physical Exam: Filed Vitals:   06/13/13 1530 06/13/13 1530 06/13/13 1645 06/13/13 1715  BP: 105/73  109/68 127/75  Pulse: 86  92 56  Temp: 97.2 F (36.2 C) 98.2 F (36.8 C) 97.3 F (36.3 C) 97.5 F (36.4 C)  TempSrc:      Resp: 20  15 22   SpO2: 93%  96% 93%   General: Vital signs reviewed.  Patient is an elderly man who appears volume depleted and malnourished. In no acute distress and cooperative with exam. Alert and oriented x3.  Head: Normocephalic and atraumatic. Eyes: PERRL, EOMI, conjunctivae normal, No scleral icterus.  Mouth: Severely dry mucus membranes;  dry/crusted tongue and chapped lips. Speech unclear. Neck: Supple, trachea midline, normal ROM, No JVD, masses, thyromegaly, or carotid bruit present.  Cardiovascular: RRR, S1 normal, S2 normal, no murmurs, gallops, or rubs. Pulmonary/Chest: Normal respiratory effort, CTAB, no wheezes, rales, or rhonchi. Abdominal: Soft, non-tender, non-distended, bowel sounds are normal, no masses, organomegaly, or guarding present.  Musculoskeletal: Tenderness over lumbar spine, covered with dressing. No joint deformities, erythema, or stiffness, ROM full and no nontender. Extremities: No swelling or edema,  pulses symmetric and intact bilaterally. No cyanosis or clubbing. Neurological: A&O x3, Strength is normal and symmetric bilaterally, cranial nerve II-XII are grossly intact, no focal motor deficit, sensory intact to light touch bilaterally.  Skin: Warm, dry and intact. No rashes or erythema. Appears very dry, w/ mild tenting.  Psychiatric: Normal mood and affect. Speech unclear, behavior is normal.   Lab results: Basic Metabolic Panel:  Recent Labs  16/10/96 1255 06/13/13 1315  NA 142 146*  K 3.3* 3.3*  CL 105 102  CO2 31  --   GLUCOSE 107* 104*  BUN 9 8  CREATININE 0.84 1.10  CALCIUM 8.7  --    Liver Function Tests:  Recent Labs  06/13/13 1255  AST 21  ALT 16  ALKPHOS 95  BILITOT 0.9  PROT 6.1  ALBUMIN 2.1*   No results found for this basename: LIPASE, AMYLASE,  in the last 72 hours No results found for this basename: AMMONIA,  in the last 72 hours CBC:  Recent Labs  06/13/13 1255 06/13/13 1315  WBC 8.1  --   NEUTROABS 6.6  --   HGB 11.3* 12.2*  HCT 34.9* 36.0*  MCV 89.0  --   PLT 194  --    Cardiac Enzymes:  Recent Labs  06/13/13 1255  TROPONINI <0.30   Coagulation:  Recent Labs  06/13/13 1255  LABPROT 19.1*  INR 1.66*   Urine Drug Screen: Drugs of Abuse     Component Value Date/Time   LABOPIA NONE DETECTED 06/13/2013 1332   COCAINSCRNUR NONE DETECTED  06/13/2013 1332   LABBENZ NONE DETECTED 06/13/2013 1332   AMPHETMU NONE DETECTED 06/13/2013 1332   THCU POSITIVE* 06/13/2013 1332   LABBARB NONE DETECTED 06/13/2013 1332    Alcohol Level:  Recent Labs  06/13/13 1255  ETH <11   Urinalysis:  Recent Labs  06/13/13 1332  COLORURINE AMBER*  LABSPEC 1.020  PHURINE 6.0  GLUCOSEU NEGATIVE  HGBUR SMALL*  BILIRUBINUR NEGATIVE  KETONESUR NEGATIVE  PROTEINUR 30*  UROBILINOGEN 0.2  NITRITE NEGATIVE  LEUKOCYTESUR MODERATE*   Imaging results:  Ct Head Wo Contrast  06/13/2013   CLINICAL DATA:  Altered mental status. Code stroke.  EXAM: CT HEAD WITHOUT CONTRAST  TECHNIQUE: Contiguous axial images were obtained from the base of the skull through the vertex without intravenous contrast.  COMPARISON:  04/26/2013  FINDINGS: No mass lesion. No midline shift. No acute hemorrhage or hematoma. No extra-axial fluid collections. No evidence of acute infarction. There is diffuse cerebral cortical and cerebellar atrophy  with scattered periventricular lacunar infarcts as well as a small lacunar infarct in the central portion of the medulla on image number 8, unchanged. No acute osseous abnormalities.  IMPRESSION: No acute abnormality.  Atrophy and multiple old lacunar infarcts.  Critical Value/emergent results were called by telephone at the time of interpretation on 06/13/2013 at 2:45 PM to Dr.Reynolds , who verbally acknowledged these results.   Electronically Signed   By: Geanie Cooley M.D.   On: 06/13/2013 14:45   Dg Chest Portable 1 View  06/13/2013   CLINICAL DATA:  Altered mental status. Hypotension.  EXAM: PORTABLE CHEST - 1 VIEW  COMPARISON:  05/17/2013  FINDINGS: There is slight atelectasis at the lung bases, improved. Heart size and vascularity are normal. Large hiatal hernia, unchanged. No acute osseous abnormality.  IMPRESSION: Slight bibasilar atelectasis, improved.   Electronically Signed   By: Geanie Cooley M.D.   On: 06/13/2013 13:27     Other results: EKG: NSR @ 86 bpm w/ prolonged QTc @ 516 ms  Assessment & Plan by Problem: Mr. Adir Schicker is a 79 y.o. male w/ PMHx of HTN, CAD, HLD, DM type II, Gout, BPH, Prostate CA, recent RLE DVT, and recent lumbar spine osteomyelitis s/p PLIF surgery in July, presents to the ED from rehabilitation facility after he was found to have slurred speech and weakness.  Slurred speech/weakness- Patient is elderly gentleman brought from NH, originally a code stroke. The patient was out of the window, symptoms did not correlate with a stroke, and CT on admission was -ve for acute abnormality. Code Stroke was cancelled. After interviewing the patient, it does not appear that the patient had a CVA/TIA as he has full strength in upper and lower extremities, no facial weakness, numbness, and is A&O x3. It appears that these symptoms may be d/t significant dehydration and volume depletion as the patient was significantly hypotensive on admission. The patienthas such severe dryness of his mucus membranes that his tongue is dry and crusted, making his speech difficult to understand. Ischemic disease, however, is still a possibility and will reconsder if patient shows signs of neurological change.  -Admitted to med surg -Neurology consulted, appreciate the consult; also feel this is most likely d/t volume depletetion. -Will consider MRI if neurological evaluation changes.  -Neuro checks q4h -PT/OT eval tomorrow -SLP eval; patient failed bedside nursing swallow evaluation; will keep NPO until formal evaluation. Switched po meds to IV; Cipro 500 mg po bid --> 400 mg IV bid; Vanco --> Flagyl 250 mg IV q6h for now. -Fall precautions   Hypotension- Patient presented with BP in ED of 66/42, most likely 2/2 volume depletion, however sepsis cannot be completely ruled out. However, patient w/out leukocytosis, afebrile. CXR shows slight bibasilar atelectasis, but improved from last admission. UA shows moderate  leukocytosis, and few bacteria, however, the patient is currently on Cipro 500 mg for previous lumbar wound infection. Patient also with complicated previous admission w/ C. Dif and patient still with some diarrhea. On oral Vancomycin (liquid) at home. -Received 3L NS in ED with appropriate response. Most recent BP 134/67. -Started on D5 NS @ 125 ml/hr for now. Will monitor CBG's and switch to NS if necessary.  -Hold BP meds for now.   DM type II-  Most recent A1c 5.2 in 03/2013. -Started on ISS sensitive for now.  -CBG's q4h for now as patient is on D5 NS  HTN- Home medications- Lasix 20 mg bid, Lisinopril 5 mg po qd -Will hold home  meds for now given hypotension  CAD- Patient with CABG in 2002 w/ [facility]. Patient denies chest pain, SOB, diaphoresis, nausea, vomiting.  -Troponin x 1 negative -EKG performed, shows no dynamic changes  HLD- Most recent lipid profile wnl.  -Continue statin  Gout- Stable, not on any medications at this time.   BPH/Prostate CA- No issues at this time. Had radiation therapy 05/05/13.  -Hold Flomax for now given HoTN   Dispo: Disposition is deferred at this time, awaiting improvement of current medical problems. Anticipated discharge in approximately 1-2 day(s).   The patient does not have a current PCP (Provider Not In System) and does need an [facility] hospital follow-up appointment after discharge.  The patient does have transportation limitations that hinder transportation to clinic appointments.  Signed: Courtney Paris, MD 06/13/2013, 5:14 PM  Pager: [phone]

## 2013-06-13 NOTE — ED Notes (Signed)
Patient transported to CT 

## 2013-06-13 NOTE — ED Notes (Signed)
Admitting MD

## 2013-06-13 NOTE — Progress Notes (Signed)
  06/13/2013  Rm# [id number] [employer] CODE STATUS:  Full Code   History of present illness: Physical therapist reported to me this a.m., at approximately 1045 to 11 AM, but the patient was too weak and unable to participate in physical therapy. She asked for consultation with the patient regarding to increased lethargy, and confusion.  The patient was noted to have the 50% of his breakfast this morning. Per the nursing report, there was a sharp and sudden decline between 8:30 AM and 11 AM.  Past medical history: The patient has had spinal surgery, contracted osteomyelitis, and required surgical debridement. He also developed diarrhea, which was diagnosed in the C. difficile. Patient was treated with Flagyl and subsequently Vancocin which resolved the diarrhea.  The patient has had some difficulty with adequate by mouth intake. He has been given multiple liters of D5 half-normal saline via clysis and IV.  Vital signs: Blood pressure 73/41 Pulse oximetry 95% Pulse approximately 90 Respiratory rate 20. Capillary blood glucose 119  The patient is awake, speech is extremely garbled and difficult to understand. I did tell the patient that he was going to the hospital and he did repeat hospital but otherwise his speech was extremely difficult to understand. Movement of all 4 extremities, and certainly a positive response to pain as noted by doing a fingerstick . blood glucose starting and establishing an IV line.  Extraocular movements intact, and pupils appear to be equally round reactive to light. Apical pulse is a regular rate and rhythm. Bilateral breath sounds are entirely completely clear. Abdomen appears nontender.  Based on the evidence of a sharp and sudden decline, decision made to call 911 and have patient transported to hospital.    The emergency service personnel arrived in less than 15 minutes, and IV line was established, the patient transported to [facility].       This encounter was created in error - please disregard.

## 2013-06-13 NOTE — ED Notes (Signed)
Patient resident at nursing facility, patient last seen normal for him at 0830, patient then found approx. 30 minutes ago with garbles speech and AMS, patient's normal state is able to talk to staff, patient now with garbled speech but will follow commands,

## 2013-06-13 NOTE — ED Notes (Signed)
Family at bedside. 

## 2013-06-13 NOTE — Progress Notes (Signed)
Pt confused and unsteady on feet. Unable to obtain orthostatic vitals. Vitals obtained with patient laying down in bed. [name] [name]

## 2013-06-13 NOTE — Code Documentation (Signed)
79 yo male arriving to [facility] at 1209 via [facility].  Patient from nursing home where he was noted to be weak, lethargic and confused with poor meal intake.  Patient has a h/o spinal surgery and subsequently developed osteomyelitis. Patient was diagnosed and is currently being treated for C. Diff.  Patient appears to be dehydrated d/t BP and dry mouth.  Code stroke activated at 1241 for slurred speech.  Stroke team arrived at 1251.  NIHSS 4 for missed age, decreased left face sensation, dysarthria, and left neglect. BP decreased with SBP 60s. Patient requiring NS IV boluses for BP.  Patient not taken directly to CT d/t clinical instability.  No acute stroke intervention at this time.  Bedside handoff completed with ED RN.

## 2013-06-13 NOTE — Consult Note (Signed)
PULMONARY  / CRITICAL CARE MEDICINE  Name: [name] [name] MRN: [id number] DOB: [birth year]    ADMISSION DATE:  06/13/2013 CONSULTATION DATE:  10/27  REFERRING MD :   EDP PRIMARY SERVICE:  [facility]ad CHIEF COMPLAINT:   Hypotension/sepsis/acute encephalopathy   BRIEF PATIENT DESCRIPTION:  This is a 40 yom who resides at a SNF due related to osteomyelitis s/p T9-L3 fusion. His course since d/c to SNF on 9/30 was complicated by DVT of RLE and most recently Cdiff infection. Presented to ED at [facility] on 10/27 after being found lethargic and confused.  In ER found to be hypotensive w/ slurred speech. PCCM asked to admit.   SIGNIFICANT EVENTS / STUDIES:  CT head 10/27: neg  LINES / TUBES:   CULTURES: BCX2 10/27>>> UC 10/27>>>  ANTIBIOTICS: Oral vanc 10/16 (started at SNF)>>> IV flagyl 10/27>>>  HISTORY OF PRESENT ILLNESS:   This is a 66 yom who resides at a SNF due related to osteomyelitis s/p T9-L3 fusion. His course since d/c to SNF on 9/30 was complicated by DVT of RLE and most recently Cdiff infection. Presented to ED at [facility] on 10/27 after being found lethargic and confused.  In ER found to be hypotensive w/ slurred speech. PCCM asked to admit.   PAST MEDICAL HISTORY :  Past Medical History  Diagnosis Date  . Hypertension   . Chronic kidney disease     bph  . Arthritis   . Coronary artery disease   . High cholesterol   . Myocardial infarction 2002    "before his lungs or legs" (05/05/2013)  . DVT (deep venous thrombosis)     "after back OR in August" (05/05/2013)  . Type II diabetes mellitus   . History of blood transfusion     "some since OR in 03/2013" (05/05/2013)  . Chronic lower back pain   . Gout   . BPH (benign prostatic hypertrophy)   . Prostate cancer     "S/P radiation tx" ((05/05/2013)  . Skin cancer of face     "had several taken off his face; had his nose cut/reconstructed" (05/05/2013)  . Shortness of breath    Past Surgical History  Procedure Laterality Date   . Appendectomy    . Coronary artery bypass graft  11/09/2000    LIMA to LAD, SVG to RPDA, seq SVG to OM2 and D1 by [name] [facility]  . Lumbar fusion  03/15/2013    Dr Shon Baton  . Posterior lumbar fusion 4 level N/A 03/16/2013    Procedure: T9 - L3 POSTERIOR POSTERIOR SPINAL FUSION ;  Surgeon: Venita Lick, MD;  Location: [facility] OR;  Service: Orthopedics;  Laterality: N/A;  . Hardware removal N/A 03/30/2013    Procedure: HARDWARE REMOVAL/IRRIGATION & DEBRIDEMENT OF WOUND ON BACK;  Surgeon: Venita Lick, MD;  Location: [facility] OR;  Service: Orthopedics;  Laterality: N/A;  . Incision and drainage of wound N/A 04/13/2013    Procedure: IRRIGATION AND DEBRIDEMENT WOUND AND VAC DRESSING;  Surgeon: Venita Lick, MD;  Location: [facility] OR;  Service: Orthopedics;  Laterality: N/A;  . Application of wound vac  04/13/2013; 05/04/2013  . Back surgery    . Cataract extraction w/ intraocular lens  implant, bilateral Bilateral   . Secondary closure of wound N/A 05/05/2013    Procedure: WASH-OUT CLOSURE OF WOUND PARTIAL;  Surgeon: Venita Lick, MD;  Location: [facility] OR;  Service: Orthopedics;  Laterality: N/A;  . Incision and drainage of wound N/A 05/18/2013    Procedure: REMOVAL WOUND VAC, IRRIGATION  AND DEBRIDEMENT,  WOUND CLOSURE POSTERIOR THORACOLUMBAR INCISION  ;  Surgeon: Venita Lick, MD;  Location: [facility] OR;  Service: Orthopedics;  Laterality: N/A;   Prior to Admission medications   Medication Sig Start Date End Date Taking? Authorizing Provider  acetaminophen (TYLENOL) 325 MG tablet Take 650 mg by mouth every 6 (six) hours as needed for pain.    Historical Provider, MD  antiseptic oral rinse (BIOTENE) LIQD 15 mLs by Mouth Rinse route 2 (two) times daily. 04/15/13   Simonne Martinet, NP  Ascorbic Acid (VITAMIN C) 1000 MG tablet Take 2,000 mg by mouth daily.    Historical Provider, MD  atorvastatin (LIPITOR) 10 MG tablet Take 10 mg by mouth daily.    Historical Provider, MD  brimonidine (ALPHAGAN) 0.2 % ophthalmic  solution Place 1 drop into both eyes 2 (two) times daily.    Historical Provider, MD  Calcium Carbonate-Vitamin D (CALCIUM 600+D) 600-200 MG-UNIT TABS Take 1 tablet by mouth daily.    Historical Provider, MD  cholecalciferol (VITAMIN D) 1000 UNITS tablet Take 1,000 Units by mouth daily.    Historical Provider, MD  ciprofloxacin (CIPRO) 500 MG tablet Take 1 tablet (500 mg total) by mouth 2 (two) times daily. 05/24/13 06/20/13  Venita Lick, MD  dextrose (GLUTOSE) 40 % GEL Take 1 Tube by mouth once as needed (for hypotension).    Historical Provider, MD  dronabinol (MARINOL) 2.5 MG capsule Take 2.5 mg by mouth 3 (three) times daily.    Historical Provider, MD  famotidine (PEPCID) 20 MG tablet Take 20 mg by mouth daily.    Historical Provider, MD  feeding supplement (PRO-STAT SUGAR FREE 64) LIQD Take 30 mLs by mouth 2 (two) times daily.     Historical Provider, MD  ferrous sulfate 325 (65 FE) MG tablet Take 325 mg by mouth 2 (two) times daily with a meal.    Historical Provider, MD  furosemide (LASIX) 20 MG tablet Take 20 mg by mouth 2 (two) times daily.    Historical Provider, MD  glucagon (GLUCAGEN) 1 MG SOLR injection Inject 1 mg into the vein once as needed.    Historical Provider, MD  GLUCERNA (GLUCERNA) LIQD Take 237 mLs by mouth daily.    Historical Provider, MD  ipratropium-albuterol (DUONEB) 0.5-2.5 (3) MG/3ML SOLN Take 3 mLs by nebulization every 6 (six) hours as needed (for shortness of breath).    Historical Provider, MD  lisinopril (PRINIVIL,ZESTRIL) 5 MG tablet Take 5 mg by mouth daily.    Historical Provider, MD  Multiple Vitamins-Minerals (MULTIVITAMIN WITH MINERALS) tablet Take 1 tablet by mouth daily.    Historical Provider, MD  potassium chloride (KLOR-CON) 20 MEQ packet Take 20 mEq by mouth 2 (two) times daily.    Historical Provider, MD  sertraline (ZOLOFT) 50 MG tablet Take 50 mg by mouth at bedtime.    Historical Provider, MD  tamsulosin (FLOMAX) 0.4 MG CAPS Take 0.4 mg by mouth  daily.    Historical Provider, MD  traMADol (ULTRAM) 50 MG tablet Take 1 tablet = 50 mg by mouth every 12 hours for pain; take 1 tablet = 50 mg every 8 hours as need for pain. 06/03/13   Kimber Relic, MD  zinc sulfate 220 MG capsule Take 220 mg by mouth daily.    Historical Provider, MD   No Known Allergies  FAMILY HISTORY:  Family History  Problem Relation Age of Onset  . Diabetes Mother   . Stroke Brother     x2  SOCIAL HISTORY:  reports that he has never smoked. He has never used smokeless tobacco. He reports that he drinks alcohol. He reports that he does not use illicit drugs.  REVIEW OF SYSTEMS:   Unable  SUBJECTIVE:  Thirsty  VITAL SIGNS: Temp:  [97.5 F (36.4 C)-97.8 F (36.6 C)] 97.5 F (36.4 C) (10/27 1400) Pulse Rate:  [78-160] 80 (10/27 1400) Resp:  [17-18] 17 (10/27 1400) BP: (66-95)/(40-70) 95/53 mmHg (10/27 1345) SpO2:  [92 %-100 %] 94 % (10/27 1400) HEMODYNAMICS:   VENTILATOR SETTINGS:   INTAKE / OUTPUT: Intake/Output   None     PHYSICAL EXAMINATION: General:  Chronically ill appearing white male. Confused but cooperative  Neuro:  Oriented x2, generalized weakness HEENT:  [place], mm dry, tongue discolored  Cardiovascular:  rrr no MRG Lungs:  Clear, decreased bases  Abdomen:  Soft, non-tender + bowel sounds  Musculoskeletal:  No gross deformities  Skin:  Intact, no edema   LABS:  CBC Recent Labs     06/13/13  1255  06/13/13  1315  WBC  8.1   --   HGB  11.3*  12.2*  HCT  34.9*  36.0*  PLT  194   --    Coag's Recent Labs     06/13/13  1255  APTT  32  INR  1.66*   BMET Recent Labs     06/13/13  1255  06/13/13  1315  NA  142  146*  K  3.3*  3.3*  CL  105  102  CO2  31   --   BUN  9  8  CREATININE  0.84  1.10  GLUCOSE  107*  104*   Electrolytes Recent Labs     06/13/13  1255  CALCIUM  8.7   Sepsis Markers No results found for this basename: LACTICACIDVEN, PROCALCITON, O2SATVEN,  in the last 72 hours ABG No results found  for this basename: PHART, PCO2ART, PO2ART,  in the last 72 hours Liver Enzymes Recent Labs     06/13/13  1255  AST  21  ALT  16  ALKPHOS  95  BILITOT  0.9  ALBUMIN  2.1*   Cardiac Enzymes Recent Labs     06/13/13  1255  TROPONINI  <0.30   Glucose No results found for this basename: GLUCAP,  in the last 72 hours  Imaging Dg Chest Portable 1 View  06/13/2013   CLINICAL DATA:  Altered mental status. Hypotension.  EXAM: PORTABLE CHEST - 1 VIEW  COMPARISON:  05/17/2013  FINDINGS: There is slight atelectasis at the lung bases, improved. Heart size and vascularity are normal. Large hiatal hernia, unchanged. No acute osseous abnormality.  IMPRESSION: Slight bibasilar atelectasis, improved.   Electronically Signed   By: Geanie Cooley M.D.   On: 06/13/2013 13:27     CXR: low volume bilateral atelectasis   ASSESSMENT / PLAN:  PULMONARY A: Bilateral atelectasis  P:   Wean FIO2 Cont pulse ox   CARDIOVASCULAR A:  Hypovolemia  Responded to volume challenge. Lactate nml. Could consider sepsis on ddx but does not meet SIRS criteria   P:  Cont IVFs Admit to SDU  RENAL A:   Dehydration Hypernatremia Hypokalemia  P:   IV hydration Replace K  GASTROINTESTINAL A:   Recent C-dff colitis  Protein Calorie malnutrition  P:   NPO for now   HEMATOLOGIC A:   Chronic anemia  RLE DVT IVC placed 8/28 P:  Trend CBC [place] heparin   INFECTIOUS  A:   C-diff Osteomyelitis  P:   Cont vanc/flagyl  Cont Cipro  ENDOCRINE A:   No acute issue  P:   Trend glucose  NEUROLOGIC A:   Acute encephalopathy -improved w/ hydration CT neg P:   Supportive care   TODAY'S SUMMARY:  Responds to IVFs Admit to SDU, cont IV abx for osteo and vanc/flagyl for cdiff.  OK for IM admit   Patient is chronically ill, seems very dehydrated, responded to IVF and hypotension significantly improved.  Lactic acid is 1.55.  Would admit to SDU and continue IV hydration.  Abx to be continued  and a recheck of C. Diff.  TRH to admit and PCCM will sign off, please call back if needed.  I have personally obtained a history, examined the patient, evaluated laboratory and imaging results, formulated the assessment and plan and placed orders.  Alyson Reedy, M.D. Pulmonary and Critical Care Medicine [facility] Pager: [phone]  06/13/2013, 2:43 PM

## 2013-06-13 NOTE — Progress Notes (Signed)
Alona Bene (daughter and HCPOA) states adamantly that she does not want pt to return to [facility]. Per daughter, "My dad has loss so much weight, hair, and hasn't been fed." Very upset. HCPOA wants to work with Child psychotherapist and case manager in order to place pt in an appropriate facility that will take better care of him. Gilman Schmidt

## 2013-06-13 NOTE — Consult Note (Signed)
Referring Physician: Criss Alvine    Chief Complaint: lethargy and slurred speech  HPI:                                                                                                                                         [name] [name] is an 79 y.o. male with history of T9-L3 PLIF, post operative right lower extremity DVT recorded 04-24-13 IVC placed 04-14-13.  Patient Has had multiple complications with infection post-operatively and required intervention to wash out wound and significant amount of rehabilitation. Patien was at Rehab today when he was noted to be unable to participate in rehabilitation and diffusely weak at 0830. Patient was brought to ED.  On arrival to ED he was noted to have slurred speech and generalized weakness. Code stroke was called. On arrival patient was noted to have BP 66/42, temp 97.8,  extremely dry mouth, generalized weakness. Code stroke was canceled due to being out of window.   Date last known well: Date: 06/13/2013 Time last known well: Time: 08:30 tPA Given: No: out of window NIHSS: 4   Past Medical History  Diagnosis Date  . Hypertension   . Chronic kidney disease     bph  . Arthritis   . Coronary artery disease   . High cholesterol   . Myocardial infarction 2002    "before his lungs or legs" (05/05/2013)  . DVT (deep venous thrombosis)     "after back OR in August" (05/05/2013)  . Type II diabetes mellitus   . History of blood transfusion     "some since OR in 03/2013" (05/05/2013)  . Chronic lower back pain   . Gout   . BPH (benign prostatic hypertrophy)   . Prostate cancer     "S/P radiation tx" ((05/05/2013)  . Skin cancer of face     "had several taken off his face; had his nose cut/reconstructed" (05/05/2013)  . Shortness of breath     Past Surgical History  Procedure Laterality Date  . Appendectomy    . Coronary artery bypass graft  11/09/2000    LIMA to LAD, SVG to RPDA, seq SVG to OM2 and D1 by [name] [facility]  . Lumbar  fusion  03/15/2013    Dr Shon Baton  . Posterior lumbar fusion 4 level N/A 03/16/2013    Procedure: T9 - L3 POSTERIOR POSTERIOR SPINAL FUSION ;  Surgeon: Venita Lick, MD;  Location: [facility] OR;  Service: Orthopedics;  Laterality: N/A;  . Hardware removal N/A 03/30/2013    Procedure: HARDWARE REMOVAL/IRRIGATION & DEBRIDEMENT OF WOUND ON BACK;  Surgeon: Venita Lick, MD;  Location: [facility] OR;  Service: Orthopedics;  Laterality: N/A;  . Incision and drainage of wound N/A 04/13/2013    Procedure: IRRIGATION AND DEBRIDEMENT WOUND AND VAC DRESSING;  Surgeon: Venita Lick, MD;  Location: [facility] OR;  Service: Orthopedics;  Laterality: N/A;  . Application of wound vac  04/13/2013; 05/04/2013  . Back surgery    . Cataract extraction w/ intraocular lens  implant, bilateral Bilateral   . Secondary closure of wound N/A 05/05/2013    Procedure: WASH-OUT CLOSURE OF WOUND PARTIAL;  Surgeon: Venita Lick, MD;  Location: [facility] OR;  Service: Orthopedics;  Laterality: N/A;  . Incision and drainage of wound N/A 05/18/2013    Procedure: REMOVAL WOUND VAC, IRRIGATION AND DEBRIDEMENT,  WOUND CLOSURE POSTERIOR THORACOLUMBAR INCISION  ;  Surgeon: Venita Lick, MD;  Location: [facility] OR;  Service: Orthopedics;  Laterality: N/A;    Family History  Problem Relation Age of Onset  . Diabetes Mother   . Stroke Brother     x2   Social History:  reports that he has never smoked. He has never used smokeless tobacco. He reports that he drinks alcohol. He reports that he does not use illicit drugs.  Allergies: No Known Allergies  Medications:                                                                                                                           Current Facility-Administered Medications  Medication Dose Route Frequency Provider Last Rate Last Dose  . potassium chloride 10 mEq in 100 mL IVPB  10 mEq Intravenous Once Hurman Horn, MD 100 mL/hr at 06/13/13 1602 10 mEq at 06/13/13 1602   Current Outpatient Prescriptions   Medication Sig Dispense Refill  . acetaminophen (TYLENOL) 325 MG tablet Take 650 mg by mouth every 6 (six) hours as needed for pain.      . Ascorbic Acid (VITAMIN C) 1000 MG tablet Take 2,000 mg by mouth daily.      [name] atorvastatin (LIPITOR) 10 MG tablet Take 10 mg by mouth daily.      [name] bismuth subsalicylate (PEPTO BISMOL) 262 MG chewable tablet Chew 524 mg by mouth every 6 (six) hours as needed for indigestion.      . bismuth subsalicylate (PEPTO BISMOL) 262 MG/15ML suspension Take 30 mLs by mouth every 6 (six) hours as needed for indigestion.      . brimonidine (ALPHAGAN) 0.2 % ophthalmic solution Place 1 drop into both eyes 2 (two) times daily.      . Calcium Carbonate-Vitamin D (CALCIUM 600+D) 600-200 MG-UNIT TABS Take 1 tablet by mouth daily.      . cholecalciferol (VITAMIN D) 1000 UNITS tablet Take 1,000 Units by mouth daily.      . ciprofloxacin (CIPRO) 500 MG tablet Take 1 tablet (500 mg total) by mouth 2 (two) times daily.  70 tablet  0  . dextrose (GLUTOSE) 40 % GEL Take 1 Tube by mouth once as needed (for hypotension).      [name] dronabinol (MARINOL) 2.5 MG capsule Take 2.5 mg by mouth 3 (three) times daily.      . famotidine (PEPCID) 20 MG tablet Take 20 mg by mouth daily.      . feeding supplement (PRO-STAT SUGAR FREE 64) LIQD Take 30 mLs  by mouth 2 (two) times daily.       . ferrous sulfate 325 (65 FE) MG tablet Take 325 mg by mouth 2 (two) times daily with a meal.      . furosemide (LASIX) 20 MG tablet Take 20 mg by mouth 2 (two) times daily.      [name] glucagon (GLUCAGEN) 1 MG SOLR injection Inject 1 mg into the vein once as needed.      [name] GLUCERNA (GLUCERNA) LIQD Take 237 mLs by mouth daily. Vanilla      . ipratropium-albuterol (DUONEB) 0.5-2.5 (3) MG/3ML SOLN Take 3 mLs by nebulization every 6 (six) hours as needed (for shortness of breath).      [name] lisinopril (PRINIVIL,ZESTRIL) 5 MG tablet Take 5 mg by mouth daily.      . Mouthwashes (LISTERINE AGENT COOLBLUE) LIQD Use as directed 15  mLs in the mouth or throat 2 (two) times daily.      . Multiple Vitamins-Minerals (MULTIVITAMIN WITH MINERALS) tablet Take 1 tablet by mouth daily.      [name] omeprazole (PRILOSEC) 20 MG capsule Take 20 mg by mouth daily.      . potassium chloride (KLOR-CON) 20 MEQ packet Take 20 mEq by mouth daily.       . sertraline (ZOLOFT) 50 MG tablet Take 50 mg by mouth daily.       . tamsulosin (FLOMAX) 0.4 MG CAPS Take 0.4 mg by mouth at bedtime.       . traMADol (ULTRAM) 50 MG tablet Take 1 tablet = 50 mg by mouth every 12 hours for pain; take 1 tablet = 50 mg every 8 hours as need for pain.  120 tablet  0  . Vancomycin HCl (FIRST-VANCOMYCIN 50) 50 MG/ML SOLN Take 125 mg by mouth every 6 (six) hours. For 14 days. Started 06/08/13      . zinc oxide (BALMEX) 11.3 % CREA cream Apply 1 application topically daily as needed (to scrotum).      . zinc sulfate 220 MG capsule Take 220 mg by mouth daily.         ROS:                                                                                                                                       History obtained from the patient and and wife  General ROS: negative for - chills, fatigue, fever, night sweats, weight gain or weight loss Psychological ROS: negative for - behavioral disorder, hallucinations, memory difficulties, mood swings or suicidal ideation Ophthalmic ROS: negative for - blurry vision, double vision, eye pain or loss of vision ENT ROS: negative for - epistaxis, nasal discharge, oral lesions, sore throat, tinnitus or vertigo Allergy and Immunology ROS: negative for - hives or itchy/watery eyes Hematological and Lymphatic ROS: negative for - bleeding problems, bruising or swollen lymph nodes Endocrine ROS: negative for - galactorrhea, hair pattern changes,  polydipsia/polyuria or temperature intolerance Respiratory ROS: negative for - cough, hemoptysis, shortness of breath or wheezing Cardiovascular ROS: negative for - chest pain, dyspnea on  exertion, edema or irregular heartbeat Gastrointestinal ROS: negative for - abdominal pain, diarrhea, hematemesis, nausea/vomiting or stool incontinence Genito-Urinary ROS: negative for - dysuria, hematuria, incontinence or urinary frequency/urgency Musculoskeletal ROS: negative for - joint swelling or muscular weakness Neurological ROS: as noted in HPI Dermatological ROS: negative for rash and skin lesion changes  Neurologic Examination:                                                                                                      Blood pressure 105/73, pulse 86, temperature 98.2 F (36.8 C), temperature source Oral, resp. rate 20, SpO2 93.00%.  Mental Status: Alert, not oriented to birthdate.  Speech dysarthric (liekely due to severely dry mouth) without evidence of aphasia.  Able to follow simple commands without difficulty. Cranial Nerves: II: Discs flat bilaterally; Visual fields shows blink to threat bilaterally, pupils equal, round, reactive to light and accommodation III,IV, VI: ptosis not present, extra-ocular motions intact bilaterally and doll's intact V,VII: face symmetric, facial light touch sensation decreased in left  VIII: hearing normal bilaterally IX,X: gag reflex present XI: bilateral shoulder shrug XII: midline tongue extension without atrophy or fasciculations  Motor: Moves all extremities antigravity with no drift.  Sensory: Pinprick and light touch intact throughout, bilaterally with left sided neglect to DSS but this was inconsistent.  Deep Tendon Reflexes:  Right: Upper Extremity   Left: Upper extremity   biceps (C-5 to C-6) 2/4   biceps (C-5 to C-6) 2/4 tricep (C7) 2/4    triceps (C7) 2/4 Brachioradialis (C6) 2/4  Brachioradialis (C6) 2/4  Lower Extremity Lower Extremity  quadriceps (L-2 to L-4) 2/4   quadriceps (L-2 to L-4) 2/4 Achilles (S1) 0/4   Achilles (S1) 0/4  Plantars: Right: downgoing   Left: downgoing Cerebellar: normal finger-to-nose,   Gait: not tested CV: pulses palpable throughout    Results for orders placed during the hospital encounter of 06/13/13 (from the past 48 hour(s))  ETHANOL     Status: None   Collection Time    06/13/13 12:55 PM      Result Value Range   Alcohol, Ethyl (B) <11  0 - 11 mg/dL   Comment:            LOWEST DETECTABLE LIMIT FOR     SERUM ALCOHOL IS 11 mg/dL     FOR MEDICAL PURPOSES ONLY  PROTIME-INR     Status: Abnormal   Collection Time    06/13/13 12:55 PM      Result Value Range   Prothrombin Time 19.1 (*) 11.6 - 15.2 seconds   INR 1.66 (*) 0.00 - 1.49  APTT     Status: None   Collection Time    06/13/13 12:55 PM      Result Value Range   aPTT 32  24 - 37 seconds  CBC     Status: Abnormal   Collection Time    06/13/13 12:55 PM  Result Value Range   WBC 8.1  4.0 - 10.5 K/uL   RBC 3.92 (*) 4.22 - 5.81 MIL/uL   Hemoglobin 11.3 (*) 13.0 - 17.0 g/dL   HCT 16.1 (*) 09.6 - 04.5 %   MCV 89.0  78.0 - 100.0 fL   MCH 28.8  26.0 - 34.0 pg   MCHC 32.4  30.0 - 36.0 g/dL   RDW 40.9 (*) 81.1 - 91.4 %   Platelets 194  150 - 400 K/uL  DIFFERENTIAL     Status: Abnormal   Collection Time    06/13/13 12:55 PM      Result Value Range   Neutrophils Relative % 82 (*) 43 - 77 %   Neutro Abs 6.6  1.7 - 7.7 K/uL   Lymphocytes Relative 10 (*) 12 - 46 %   Lymphs Abs 0.8  0.7 - 4.0 K/uL   Monocytes Relative 6  3 - 12 %   Monocytes Absolute 0.5  0.1 - 1.0 K/uL   Eosinophils Relative 1  0 - 5 %   Eosinophils Absolute 0.1  0.0 - 0.7 K/uL   Basophils Relative 0  0 - 1 %   Basophils Absolute 0.0  0.0 - 0.1 K/uL  COMPREHENSIVE METABOLIC PANEL     Status: Abnormal   Collection Time    06/13/13 12:55 PM      Result Value Range   Sodium 142  135 - 145 mEq/L   Potassium 3.3 (*) 3.5 - 5.1 mEq/L   Chloride 105  96 - 112 mEq/L   CO2 31  19 - 32 mEq/L   Glucose, Bld 107 (*) 70 - 99 mg/dL   BUN 9  6 - 23 mg/dL   Creatinine, Ser 7.82  0.50 - 1.35 mg/dL   Calcium 8.7  8.4 - 95.6 mg/dL   Total  Protein 6.1  6.0 - 8.3 g/dL   Albumin 2.1 (*) 3.5 - 5.2 g/dL   AST 21  0 - 37 U/L   ALT 16  0 - 53 U/L   Alkaline Phosphatase 95  39 - 117 U/L   Total Bilirubin 0.9  0.3 - 1.2 mg/dL   GFR calc non Af Amer 83 (*) >90 mL/min   GFR calc Af Amer >90  >90 mL/min   Comment: (NOTE)     The eGFR has been calculated using the CKD EPI equation.     This calculation has not been validated in all clinical situations.     eGFR's persistently <90 mL/min signify possible Chronic Kidney     Disease.  TROPONIN I     Status: None   Collection Time    06/13/13 12:55 PM      Result Value Range   Troponin I <0.30  <0.30 ng/mL   Comment:            Due to the release kinetics of cTnI,     a negative result within the first hours     of the onset of symptoms does not rule out     myocardial infarction with certainty.     If myocardial infarction is still suspected,     repeat the test at appropriate intervals.  POCT I-STAT TROPONIN I     Status: None   Collection Time    06/13/13  1:12 PM      Result Value Range   Troponin i, poc 0.00  0.00 - 0.08 ng/mL   Comment 3  Comment: Due to the release kinetics of cTnI,     a negative result within the first hours     of the onset of symptoms does not rule out     myocardial infarction with certainty.     If myocardial infarction is still suspected,     repeat the test at appropriate intervals.  POCT I-STAT, CHEM 8     Status: Abnormal   Collection Time    06/13/13  1:15 PM      Result Value Range   Sodium 146 (*) 135 - 145 mEq/L   Potassium 3.3 (*) 3.5 - 5.1 mEq/L   Chloride 102  96 - 112 mEq/L   BUN 8  6 - 23 mg/dL   Creatinine, Ser 6.96  0.50 - 1.35 mg/dL   Glucose, Bld 295 (*) 70 - 99 mg/dL   Calcium, Ion 2.84  1.32 - 1.30 mmol/L   TCO2 27  0 - 100 mmol/L   Hemoglobin 12.2 (*) 13.0 - 17.0 g/dL   HCT 44.0 (*) 10.2 - 72.5 %  CG4 I-STAT (LACTIC ACID)     Status: None   Collection Time    06/13/13  1:15 PM      Result Value Range    Lactic Acid, Venous 1.55  0.5 - 2.2 mmol/L  URINE RAPID DRUG SCREEN (HOSP PERFORMED)     Status: Abnormal   Collection Time    06/13/13  1:32 PM      Result Value Range   Opiates NONE DETECTED  NONE DETECTED   Cocaine NONE DETECTED  NONE DETECTED   Benzodiazepines NONE DETECTED  NONE DETECTED   Amphetamines NONE DETECTED  NONE DETECTED   Tetrahydrocannabinol POSITIVE (*) NONE DETECTED   Barbiturates NONE DETECTED  NONE DETECTED   Comment:            DRUG SCREEN FOR MEDICAL PURPOSES     ONLY.  IF CONFIRMATION IS NEEDED     FOR ANY PURPOSE, NOTIFY LAB     WITHIN 5 DAYS.                LOWEST DETECTABLE LIMITS     FOR URINE DRUG SCREEN     Drug Class       Cutoff (ng/mL)     Amphetamine      1000     Barbiturate      200     Benzodiazepine   200     Tricyclics       300     Opiates          300     Cocaine          300     THC              50  URINALYSIS, ROUTINE W REFLEX MICROSCOPIC     Status: Abnormal   Collection Time    06/13/13  1:32 PM      Result Value Range   Color, Urine AMBER (*) YELLOW   Comment: BIOCHEMICALS MAY BE AFFECTED BY COLOR   APPearance CLOUDY (*) CLEAR   Specific Gravity, Urine 1.020  1.005 - 1.030   pH 6.0  5.0 - 8.0   Glucose, UA NEGATIVE  NEGATIVE mg/dL   Hgb urine dipstick SMALL (*) NEGATIVE   Bilirubin Urine NEGATIVE  NEGATIVE   Ketones, ur NEGATIVE  NEGATIVE mg/dL   Protein, ur 30 (*) NEGATIVE mg/dL   Urobilinogen, UA 0.2  0.0 - 1.0 mg/dL  Nitrite NEGATIVE  NEGATIVE   Leukocytes, UA MODERATE (*) NEGATIVE  URINE MICROSCOPIC-ADD ON     Status: Abnormal   Collection Time    06/13/13  1:32 PM      Result Value Range   Squamous Epithelial / LPF RARE  RARE   WBC, UA 21-50  <3 WBC/hpf   RBC / HPF 0-2  <3 RBC/hpf   Bacteria, UA FEW (*) RARE   Casts HYALINE CASTS (*) NEGATIVE   Urine-Other MUCOUS PRESENT     Comment: MANY YEAST     AMORPHOUS URATES/PHOSPHATES   Ct Head Wo Contrast  06/13/2013   CLINICAL DATA:  Altered mental status. Code  stroke.  EXAM: CT HEAD WITHOUT CONTRAST  TECHNIQUE: Contiguous axial images were obtained from the base of the skull through the vertex without intravenous contrast.  COMPARISON:  04/26/2013  FINDINGS: No mass lesion. No midline shift. No acute hemorrhage or hematoma. No extra-axial fluid collections. No evidence of acute infarction. There is diffuse cerebral cortical and cerebellar atrophy with scattered periventricular lacunar infarcts as well as a small lacunar infarct in the central portion of the medulla on image number 8, unchanged. No acute osseous abnormalities.  IMPRESSION: No acute abnormality.  Atrophy and multiple old lacunar infarcts.  Critical Value/emergent results were called by telephone at the time of interpretation on 06/13/2013 at 2:45 PM to Dr.Chevonne Bostrom , who verbally acknowledged these results.   Electronically Signed   By: Geanie Cooley M.D.   On: 06/13/2013 14:45   Dg Chest Portable 1 View  06/13/2013   CLINICAL DATA:  Altered mental status. Hypotension.  EXAM: PORTABLE CHEST - 1 VIEW  COMPARISON:  05/17/2013  FINDINGS: There is slight atelectasis at the lung bases, improved. Heart size and vascularity are normal. Large hiatal hernia, unchanged. No acute osseous abnormality.  IMPRESSION: Slight bibasilar atelectasis, improved.   Electronically Signed   By: Geanie Cooley M.D.   On: 06/13/2013 13:27    Assessment and plan discussed with with attending physician and they are in agreement.    Felicie Morn PA-C [facility] [phone]  06/13/2013, 4:11 PM   Patient seen and examined.  Clinical course and management discussed.  Necessary edits performed.  I agree with the above.  Assessment and plan of care developed and discussed below.    Assessment: 79 y.o. male presenting with slurred speech.  Patient also hypotensive and dehydrated.  Speech likely secondary to these factors and do not suspect an acute infarct.  Neurologic examination otherwise inconsistent.  Head CT  reviewed and shows no acute changes.    Stroke Risk Factors - diabetes mellitus and hypertension  Recommendations: 1.  Agree with addressing medical issues 2.  If no improvement once dehydration and hypotension addressed would consider further brain imaging at that time with a MRI.  Thana Farr, MD [facility] [phone]  06/13/2013  4:11 PM

## 2013-06-13 NOTE — Progress Notes (Signed)
Georgianne Fick MD regarding pt's NPO diet status and PO medications. Per MD, hold all PO meds until further notice and will readjust medication regimen. Gilman Schmidt

## 2013-06-13 NOTE — Progress Notes (Signed)
Patient arrived to [phone]. Patient is A/Oxself. Patient is unable to answer admission questions. Patient has a son and a daughter who will be coming to visit, admission will be done then.

## 2013-06-14 DIAGNOSIS — R5381 Other malaise: Secondary | ICD-10-CM

## 2013-06-14 DIAGNOSIS — E876 Hypokalemia: Secondary | ICD-10-CM

## 2013-06-14 DIAGNOSIS — E119 Type 2 diabetes mellitus without complications: Secondary | ICD-10-CM

## 2013-06-14 DIAGNOSIS — R5383 Other fatigue: Secondary | ICD-10-CM

## 2013-06-14 LAB — GLUCOSE, CAPILLARY
Glucose-Capillary: 125 mg/dL — ABNORMAL HIGH (ref 70–99)
Glucose-Capillary: 112 mg/dL — ABNORMAL HIGH (ref 70–99)

## 2013-06-14 LAB — CBC
HCT: 36.1 % — ABNORMAL LOW (ref 39.0–52.0)
HCT: 31.4 % — ABNORMAL LOW (ref 39.0–52.0)
Hemoglobin: 10.6 g/dL — ABNORMAL LOW (ref 13.0–17.0)
MCH: 29.4 pg (ref 26.0–34.0)
MCHC: 32.7 g/dL (ref 30.0–36.0)
MCHC: 33.8 g/dL (ref 30.0–36.0)
MCV: 87.2 fL (ref 78.0–100.0)
Platelets: 189 10*3/uL (ref 150–400)
Platelets: 181 10*3/uL (ref 150–400)
RDW: 16.2 % — ABNORMAL HIGH (ref 11.5–15.5)
RDW: 16.5 % — ABNORMAL HIGH (ref 11.5–15.5)
WBC: 16.6 10*3/uL — ABNORMAL HIGH (ref 4.0–10.5)

## 2013-06-14 LAB — BASIC METABOLIC PANEL
BUN: 8 mg/dL (ref 6–23)
Calcium: 8.5 mg/dL (ref 8.4–10.5)
Chloride: 106 mEq/L (ref 96–112)
Creatinine, Ser: 0.62 mg/dL (ref 0.50–1.35)
GFR calc Af Amer: >90 mL/min (ref >90)
GFR calc non Af Amer: >90 mL/min (ref >90)
Potassium: 3.6 mEq/L (ref 3.5–5.1)

## 2013-06-14 LAB — URINE CULTURE: Culture: NO GROWTH

## 2013-06-14 LAB — CLOSTRIDIUM DIFFICILE BY PCR: Toxigenic C. Difficile by PCR: NEGATIVE

## 2013-06-14 LAB — HEMOGLOBIN A1C: Hgb A1c MFr Bld: 5.1 % (ref <5.7)

## 2013-06-14 MED ORDER — INSULIN ASPART 100 UNIT/ML ~~LOC~~ SOLN
0.0000 [IU] | SUBCUTANEOUS | Status: DC
  Administered 2013-06-14: 1 [IU] via SUBCUTANEOUS
  Administered 2013-06-15: 2 [IU] via SUBCUTANEOUS
  Administered 2013-06-16: 1 [IU] via SUBCUTANEOUS
  Administered 2013-06-16: 2 [IU] via SUBCUTANEOUS
  Administered 2013-06-17: 1 [IU] via SUBCUTANEOUS

## 2013-06-14 NOTE — Progress Notes (Addendum)
NEURO HOSPITALIST PROGRESS NOTE   SUBJECTIVE:                                                                                                                        No further complaints. Remains dysarthric with significantly dry oral cavity.   OBJECTIVE:                                                                                                                           Vital signs in last 24 hours: Temp:  [97.2 F (36.2 C)-98.7 F (37.1 C)] 98.7 F (37.1 C) (10/28 0500) Pulse Rate:  [56-160] 95 (10/28 0500) Resp:  [15-22] 20 (10/28 0500) BP: (66-134)/(40-75) 119/58 mmHg (10/28 0500) SpO2:  [92 %-100 %] 94 % (10/28 0500) Weight:  [68.21 kg (150 lb 6 oz)] 68.21 kg (150 lb 6 oz) (10/27 2030)  Intake/Output from previous day: 10/27 0701 - 10/28 0700 In: 833.3 [I.V.:833.3] Out: 750 [Urine:750] Intake/Output this shift:   Nutritional status: NPO  Past Medical History  Diagnosis Date  . Hypertension   . Chronic kidney disease     bph  . Arthritis   . Coronary artery disease   . High cholesterol   . Myocardial infarction 2002    "before his lungs or legs" (05/05/2013)  . DVT (deep venous thrombosis)     "after back OR in August" (05/05/2013)  . Type II diabetes mellitus   . History of blood transfusion     "some since OR in 03/2013" (05/05/2013)  . Chronic lower back pain   . Gout   . BPH (benign prostatic hypertrophy)   . Prostate cancer     "S/P radiation tx" ((05/05/2013)  . Skin cancer of face     "had several taken off his face; had his nose cut/reconstructed" (05/05/2013)  . Shortness of breath     Neurologic Exam:  Mental Status: Alert, oriented to [place] but not building.  Speech remains dysarthric due to significantly dry mouth and difficulty articulating dry tongue.  Able to follow simple commands without difficulty. Cranial Nerves: II: D Visual fields grossly normal, pupils equal, round, reactive to light and  accommodation III,IV, VI: ptosis not present, extra-ocular motions intact bilaterally V,VII: smile symmetric, facial  light touch sensation normal bilaterally VIII: hearing normal bilaterally IX,X: gag reflex present XI: bilateral shoulder shrug XII: midline tongue extension without atrophy or fasciculations  Motor: Moving all extremities antigravity with no drift.  Strength 4/5 Tone and bulk:normal tone throughout; no atrophy noted Sensory: Pinprick and light touch intact throughout withdrawing briskly to pain.  He states there is no difference between left and right and does not neglect left side today.  Deep Tendon Reflexes:  Right: Upper Extremity   Left: Upper extremity   biceps (C-5 to C-6) 2/4   biceps (C-5 to C-6) 2/4 tricep (C7) 2/4    triceps (C7) 2/4 Brachioradialis (C6) 2/4  Brachioradialis (C6) 2/4  Lower Extremity Lower Extremity  quadriceps (L-2 to L-4) 2/4   quadriceps (L-2 to L-4) 2/4 Achilles (S1) 0/4   Achilles (S1) 0/4  Plantars: Right: downgoing   Left: downgoing    Lab Results: Lab Results  Component Value Date/Time   CHOL 148 03/14/2013  8:08 AM   Lipid Panel No results found for this basename: CHOL, TRIG, HDL, CHOLHDL, VLDL, LDLCALC,  in the last 72 hours  Studies/Results: Ct Head Wo Contrast  06/13/2013   CLINICAL DATA:  Altered mental status. Code stroke.  EXAM: CT HEAD WITHOUT CONTRAST  TECHNIQUE: Contiguous axial images were obtained from the base of the skull through the vertex without intravenous contrast.  COMPARISON:  04/26/2013  FINDINGS: No mass lesion. No midline shift. No acute hemorrhage or hematoma. No extra-axial fluid collections. No evidence of acute infarction. There is diffuse cerebral cortical and cerebellar atrophy with scattered periventricular lacunar infarcts as well as a small lacunar infarct in the central portion of the medulla on image number 8, unchanged. No acute osseous abnormalities.  IMPRESSION: No acute abnormality.   Atrophy and multiple old lacunar infarcts.  Critical Value/emergent results were called by telephone at the time of interpretation on 06/13/2013 at 2:45 PM to Dr.Reynolds , who verbally acknowledged these results.   Electronically Signed   By: Geanie Cooley M.D.   On: 06/13/2013 14:45   Dg Chest Portable 1 View  06/13/2013   CLINICAL DATA:  Altered mental status. Hypotension.  EXAM: PORTABLE CHEST - 1 VIEW  COMPARISON:  05/17/2013  FINDINGS: There is slight atelectasis at the lung bases, improved. Heart size and vascularity are normal. Large hiatal hernia, unchanged. No acute osseous abnormality.  IMPRESSION: Slight bibasilar atelectasis, improved.   Electronically Signed   By: Geanie Cooley M.D.   On: 06/13/2013 13:27    MEDICATIONS                                                                                                                        Scheduled: . atorvastatin  10 mg Oral q1800  . brimonidine  1 drop Both Eyes BID  . ciprofloxacin  400 mg Intravenous Q12H  . dronabinol  2.5 mg Oral TID  . feeding supplement (GLUCERNA SHAKE)  237 mL Oral Daily  . feeding supplement (PRO-STAT  SUGAR FREE 64)  30 mL Oral BID  . ferrous sulfate  325 mg Oral BID WC  . heparin  5,000 Units Subcutaneous Q8H  . insulin aspart  0-9 Units Subcutaneous Q4H  . metronidazole  250 mg Intravenous Q6H  . pantoprazole  40 mg Oral Daily  . sertraline  50 mg Oral Daily  . sodium chloride  3 mL Intravenous Q12H    ASSESSMENT/PLAN:                                                                                                            79 y.o. male presenting with slurred speech. Patients blood pressure has improved, WBC is elevated at 16 with positive leukocytes in urine (rine culture is pending and blood cultures pending). Speech likely secondary to these factors and do not suspect an acute infarct. Neurologic examination otherwise inconsistent. Head CT reviewed and shows no acute changes.   Recommend: 1)  Continue to address metabolic and infective etiologies 2) If no improvement after above have been addressed, consider MRI brain.   Neurology will S/O  Assessment and plan discussed with with attending physician and they are in agreement.    Felicie Morn PA-C [facility] [phone]  06/14/2013, 12:10 PM

## 2013-06-14 NOTE — H&P (Signed)
INTERNAL MEDICINE TEACHING SERVICE Attending Admission Note  Date: 06/14/2013  Patient name: [name] [name]  Medical record number: [id number]  Date of birth: 01-13-1934    I have seen and evaluated [name] and discussed their care with the Residency Team.  79 yr old male with complete medical history pertinent for lumbar spine osteomyelitis, HTN CKD Type 2 DM, RLE DVT, presented with weakness and slurred speech. He was noted to be hypotensive on admission to 68/40 mmHg.  BP improved with volume resuscitation.  He is very clinically dry. Needs SLP eval. Would continue Cipro/Flagyl. Needs careful examination of previous lumbar surgical site for evidence of worsening infection. Repeat CBC noted, increased WBC count may be secondary to stress response and de margination.   Jonah Blue, DO, FACP Faculty [facility] Internal Medicine Residency Program 06/14/2013, 1:12 PM

## 2013-06-14 NOTE — Progress Notes (Signed)
Subjective: [name] is a 79 y.o. 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 In the ED, the patient had significant HoTN (SBP into the 60's), and volume depletion. BP returned close to normal after 3 L bolus.   Patient with significant agitation overnight, needed PIV replacement 4 times d/t patient pulling out lines. Mittens were placed overnight.   Seen at bedside this AM. Patient appeared slightly agitated, moving around in his bed, seemed confused. Does answer some questions appropriately. Still appears significantly volume depleted w/ very dry mucus membranes. Denies SOB, chest pain, abdominal pain, fever, chills, nausea, or vomiting.   Objective: Vital signs in last 24 hours: Filed Vitals:   06/13/13 1853 06/13/13 2030 06/14/13 0500 06/14/13 1342  BP: 134/67 128/72 119/58 121/64  Pulse: 82 107 95 89  Temp: 98 F (36.7 C) 97.4 F (36.3 C) 98.7 F (37.1 C) 98.1 F (36.7 C)  TempSrc: Oral Oral Oral Oral  Resp: 22 20 20 20   Weight:  150 lb 6 oz (68.21 kg)    SpO2: 95% 93% 94% 97%   Weight change:   Intake/Output Summary (Last 24 hours) at 06/14/13 1443 Last data filed at 06/14/13 1345  Gross per 24 hour  Intake 1073.34 ml  Output    750 ml  Net 323.34 ml   Physical Exam: General: Alert, cooperative, and in mild distress; agitated.  HEENT: Vision grossly intact, mouth very dry w/ red crusted tongue, lips chapped.  Neck: Full range of motion without pain, supple, no lymphadenopathy or carotid bruits Lungs: Clear to ascultation bilaterally, normal work of respiration, no wheezes, rales, ronchi Heart: Regular rate and rhythm, no murmurs, gallops, or rubs Abdomen: Soft, non-tender, non-distended, normal bowel sounds Extremities: No cyanosis, clubbing, or  edema Neurologic: Alert & oriented x2, cranial nerves II-XII intact, strength grossly intact, sensation intact to light touch  Lab Results: Basic Metabolic Panel:  Recent Labs Lab 06/13/13 1255 06/13/13 1315 06/14/13 0115  NA 142 146* 139  K 3.3* 3.3* 3.6  CL 105 102 106  CO2 31  --  24  GLUCOSE 107* 104* 97  BUN 9 8 8   CREATININE 0.84 1.10 0.62  CALCIUM 8.7  --  8.5   Liver Function Tests:  Recent Labs Lab 06/13/13 1255  AST 21  ALT 16  ALKPHOS 95  BILITOT 0.9  PROT 6.1  ALBUMIN 2.1*   CBC:  Recent Labs Lab 06/13/13 1255  06/14/13 0115 06/14/13 1056  WBC 8.1  --  19.8* 16.6*  NEUTROABS 6.6  --   --   --   HGB 11.3*  < > 11.8* 10.6*  HCT 34.9*  < > 36.1* 31.4*  MCV 89.0  --  88.7 87.2  PLT 194  --  189 181  < > = values in this interval not displayed.  Cardiac Enzymes:  Recent Labs Lab 06/13/13 1255 06/13/13 2000 06/14/13 0115  TROPONINI <0.30 <0.30 <0.30   CBG:  Recent Labs Lab 06/14/13 0909 06/14/13 1147  GLUCAP 125* 146*   Hemoglobin A1C:  Recent Labs Lab 06/14/13 0115  HGBA1C 5.1   Fasting Lipid Panel: No results found for this basename: CHOL, HDL, LDLCALC, TRIG, CHOLHDL, LDLDIRECT,  in the last 168 hours Thyroid Function Tests: No results found for this basename:  TSH, T4TOTAL, FREET4, T3FREE, THYROIDAB,  in the last 168 hours Coagulation:  Recent Labs Lab 06/13/13 1255  LABPROT 19.1*  INR 1.66*   Anemia Panel: No results found for this basename: VITAMINB12, FOLATE, FERRITIN, TIBC, IRON, RETICCTPCT,  in the last 168 hours Urine Drug Screen: Drugs of Abuse     Component Value Date/Time   LABOPIA NONE DETECTED 06/13/2013 1332   COCAINSCRNUR NONE DETECTED 06/13/2013 1332   LABBENZ NONE DETECTED 06/13/2013 1332   AMPHETMU NONE DETECTED 06/13/2013 1332   THCU POSITIVE* 06/13/2013 1332   LABBARB NONE DETECTED 06/13/2013 1332    Alcohol Level:  Recent Labs Lab 06/13/13 1255  ETH <11   Urinalysis:  Recent Labs Lab  06/13/13 1332  COLORURINE AMBER*  LABSPEC 1.020  PHURINE 6.0  GLUCOSEU NEGATIVE  HGBUR SMALL*  BILIRUBINUR NEGATIVE  KETONESUR NEGATIVE  PROTEINUR 30*  UROBILINOGEN 0.2  NITRITE NEGATIVE  LEUKOCYTESUR MODERATE*    Micro Results: Recent Results (from the past 240 hour(s))  URINE CULTURE     Status: None   Collection Time    06/13/13  1:32 PM      Result Value Range Status   Specimen Description URINE, CATHETERIZED   Final   Special Requests NONE   Final   Culture  Setup Time     Final   Value: 06/13/2013 18:06     Performed at [facility] Count     Final   Value: NO GROWTH     Performed at [facility]   Culture     Final   Value: NO GROWTH     Performed at [facility]   Report Status 06/14/2013 FINAL   Final  CULTURE, BLOOD (ROUTINE X 2)     Status: None   Collection Time    06/13/13  2:00 PM      Result Value Range Status   Specimen Description BLOOD LEFT HAND   Final   Special Requests BOTTLES DRAWN AEROBIC ONLY 10CC   Final   Culture  Setup Time     Final   Value: 06/13/2013 19:46     Performed at [facility]   Culture     Final   Value:        BLOOD CULTURE RECEIVED NO GROWTH TO DATE CULTURE WILL BE HELD FOR 5 DAYS BEFORE ISSUING A FINAL NEGATIVE REPORT     Performed at [facility]   Report Status PENDING   Incomplete  CULTURE, BLOOD (ROUTINE X 2)     Status: None   Collection Time    06/13/13  2:30 PM      Result Value Range Status   Specimen Description BLOOD RIGHT HAND   Final   Special Requests BOTTLES DRAWN AEROBIC ONLY 10CC   Final   Culture  Setup Time     Final   Value: 06/13/2013 19:46     Performed at [facility]   Culture     Final   Value:        BLOOD CULTURE RECEIVED NO GROWTH TO DATE CULTURE WILL BE HELD FOR 5 DAYS BEFORE ISSUING A FINAL NEGATIVE REPORT     Performed at [facility]   Report Status PENDING   Incomplete  MRSA PCR SCREENING     Status: Abnormal    Collection Time    06/13/13  6:43 PM      Result Value Range Status   MRSA by PCR POSITIVE (*)  NEGATIVE Final   Comment:            The GeneXpert MRSA Assay (FDA     approved for NASAL specimens     only), is one component of a     comprehensive MRSA colonization     surveillance program. It is not     intended to diagnose MRSA     infection nor to guide or     monitor treatment for     MRSA infections.     RESULT CALLED TO, READ BACK BY AND VERIFIED WITH:     Kennis Carina RN [id number] 06/13/13 A BROWNING  CLOSTRIDIUM DIFFICILE BY PCR     Status: None   Collection Time    06/14/13  9:17 AM      Result Value Range Status   C difficile by pcr NEGATIVE  NEGATIVE Final   Studies/Results: Ct Head Wo Contrast  06/13/2013   CLINICAL DATA:  Altered mental status. Code stroke.  EXAM: CT HEAD WITHOUT CONTRAST  TECHNIQUE: Contiguous axial images were obtained from the base of the skull through the vertex without intravenous contrast.  COMPARISON:  04/26/2013  FINDINGS: No mass lesion. No midline shift. No acute hemorrhage or hematoma. No extra-axial fluid collections. No evidence of acute infarction. There is diffuse cerebral cortical and cerebellar atrophy with scattered periventricular lacunar infarcts as well as a small lacunar infarct in the central portion of the medulla on image number 8, unchanged. No acute osseous abnormalities.  IMPRESSION: No acute abnormality.  Atrophy and multiple old lacunar infarcts.  Critical Value/emergent results were called by telephone at the time of interpretation on 06/13/2013 at 2:45 PM to Dr.Reynolds , who verbally acknowledged these results.   Electronically Signed   By: Geanie Cooley M.D.   On: 06/13/2013 14:45   Dg Chest Portable 1 View  06/13/2013   CLINICAL DATA:  Altered mental status. Hypotension.  EXAM: PORTABLE CHEST - 1 VIEW  COMPARISON:  05/17/2013  FINDINGS: There is slight atelectasis at the lung bases, improved. Heart size and vascularity are normal.  Large hiatal hernia, unchanged. No acute osseous abnormality.  IMPRESSION: Slight bibasilar atelectasis, improved.   Electronically Signed   By: Geanie Cooley M.D.   On: 06/13/2013 13:27   Medications: I have reviewed the patient's current medications. Scheduled Meds: . atorvastatin  10 mg Oral q1800  . brimonidine  1 drop Both Eyes BID  . ciprofloxacin  400 mg Intravenous Q12H  . dronabinol  2.5 mg Oral TID  . feeding supplement (GLUCERNA SHAKE)  237 mL Oral Daily  . feeding supplement (PRO-STAT SUGAR FREE 64)  30 mL Oral BID  . ferrous sulfate  325 mg Oral BID WC  . heparin  5,000 Units Subcutaneous Q8H  . insulin aspart  0-9 Units Subcutaneous Q4H  . metronidazole  250 mg Intravenous Q6H  . pantoprazole  40 mg Oral Daily  . sertraline  50 mg Oral Daily  . sodium chloride  3 mL Intravenous Q12H   Continuous Infusions: . dextrose 5 % and 0.9% NaCl 125 mL/hr at 06/14/13 0120   PRN Meds:.acetaminophen, acetaminophen, albuterol, ipratropium, ondansetron (ZOFRAN) IV, ondansetron, zinc oxide  Assessment/Plan: [name] is a 79 y.o. 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 In the  ED, the patient had significant HoTN (SBP into the 60's), and volume depletion. BP returned close to normal after 3 L bolus.   Slurred speech/weakness- Patient is elderly gentleman brought from NH, originally a code stroke. The patient was out of the window, symptoms did not correlate with a stroke, and CT on admission was -ve for acute abnormality. Code Stroke was cancelled. After interviewing the patient, it does not appear that the patient had a CVA/TIA as he has full strength in upper and lower extremities, no facial weakness, numbness, and is A&O x2. It appears that these symptoms may be d/t  significant dehydration and volume depletion as the patient was significantly hypotensive on admission. The patienthas such severe dryness of his mucus membranes that his tongue is dry and crusted, making his speech difficult to understand. Ischemic disease, however, is still a possibility and will reconsder if patient shows signs of neurological change.  -Neurology still following, appreciate the consult; also feel this is most likely d/t volume depletetion.  -Will consider MRI if neurological evaluation changes.  -Neuro checks q4h  -PT/OT eval tomorrow  -SLP eval recommends dysphagia 2 diet. Patient can take PO meds with liquid. Will change back to po meds. -Will continue D5 NS @ 125 ml/hr for now. -Fall precautions   Hypotension- Patient presented with BP in ED of 66/42, most likely 2/2 volume depletion, however sepsis cannot be completely ruled out. However, patient w/out leukocytosis, afebrile. CXR shows slight bibasilar atelectasis, but improved from last admission. UA shows moderate leukocytosis, and few bacteria, however, the patient is currently on Cipro 500 mg for previous lumbar wound infection. Patient also with complicated previous admission w/ C. Dif and patient still with some diarrhea. On oral Vancomycin (liquid) at home. -Received 3L NS in ED with appropriate response. Most recent BP 134/67.  -Started on D5 NS @ 125 ml/hr for now. Will monitor CBG's and switch to NS if necessary.  -Hold BP meds for now.   Leukocytosis- Patient presented to the ED without increased WBC's. However, this AM, WBC's were 19.8 w/ repeat of 16.6. Source of possible infection unclear at this time. CXR showed no signs of infiltrate.The patient did complain of some dysuria today with foley in place and on admission, urine showed moderate leukocytes and few bacteria, however, urine culture shows no growth at this time. On examination of his back wounds, does not show signs of infection, only granulation tissue.  No significant tenderness to palpation. Patient does have a h/o C. Dif and does complain of having diarrhea still at this point. C. Dif pcr on this admission is negative and patient has been on Vanco po 125 mg q6h since 02/17/13?. C. Dif infection unlikely at this time. -Foley removed, condom catheter placed. -Will repeat UA/UCx if patient continues to complain of dysuria.  -Continue to monitor back wounds and will order wound care consult to manage previous wounds.   DM type II- Most recent A1c 5.2 in 03/2013.  -Patient with poor po intake. WIll continue with D5 at this time. -On ISS sensitive for now.  -CBG's q4h for now while patient is on D5 NS   HTN- Home medications- Lasix 20 mg bid, Lisinopril 5 mg po qd  -Will continue to hold home meds for now.  CAD- Patient with CABG in 2002 w/ HoTN. Patient denies chest pain, SOB, diaphoresis, nausea, vomiting.  -Troponin x 3 negative  -EKG performed, shows no dynamic changes   HLD- Most recent lipid profile wnl.  -Continue  statin   Gout- Stable, not on any medications at this time.   BPH/Prostate CA- No issues at this time. Had radiation therapy 05/05/13.  -Hold Flomax for now given HoTN   Dispo: Disposition is deferred at this time, awaiting improvement of current medical problems.  Anticipated discharge in approximately 2-3 day(s).  -Patient from [facility]. Had very long discussion with daughter today about his recent medical history and care. She states that he has been poorly cared for at [facility] and would like another placement, but claims this was a problem previously given his C. Dif infection.  -Will discuss further with SW in terms of placement.   The patient does not have a current PCP (Provider Not In System) and does need an [facility] hospital follow-up appointment after discharge.  The patient does have transportation limitations that hinder transportation to clinic appointments.  .Services Needed at time of discharge: Y =  Yes, Blank = No PT:   OT:   RN:   Equipment:   Other:     LOS: 1 day   Courtney Paris, MD 06/14/2013, 2:43 PM Pager: [phone]

## 2013-06-14 NOTE — Progress Notes (Signed)
   May 25, 2013  History of present illness: Patient status post lumbar spine surgery complicated by osteomyelitis, necessitating a second procedure for debridement. Wound was closed. Complaint of severe liquid diarrhea. Medical history: Severe protein calorie malnutrition Coronary artery disease Hypertension Diabetes   Physical examination: Alert, depressed affect but in no acute distress Pupils are round, equally reactive to light, and extraocular movements intact. Positive red reflex. Tympanic membranes unremarkable. There is a significant amount of cerumen in the right ear canal. Only very few remaining teeth, but no gross carious disease No palpable adenopathy no palpable thyromegaly. Carotid bruits are not evident. Bilateral breast sounds are clear. Apical pulse regular rate and rhythm Bowel sounds are hyperactive. Abdomen without distinct tenderness. Bilateral lower extremities with easily palpable posterior tibial pulses and edema over the medial and. The back wound a dressing is in place. Wound care is managed by the wound care nurse.  Assessment/plan: Concerned for the diarrhea, have to consider C. difficile. We'll order a stool sample to be sent as soon as possible. We didn't start Flagyl 500 mg 1 twice a day x10 days as first-line treatment. Have also ordered a clear liquid/low residue diet to allow him hydration without contributing to additional diarrhea. We will consider IV fluids or foods via clysis if necessary. Wound care to be managed by the wound care notice Also discussed patient's status with Dr. Chilton Si. This encounter was created in error - please disregard.

## 2013-06-14 NOTE — Evaluation (Signed)
Clinical/Bedside Swallow Evaluation Patient Details  Name: [name] [name] MRN: [id number] Date of Birth: 10/18/1933  Today's Date: 06/14/2013 Time: 1540-1600 SLP Time Calculation (min): 20 min  Past Medical History:  Past Medical History  Diagnosis Date  . Hypertension   . Chronic kidney disease     bph  . Arthritis   . Coronary artery disease   . High cholesterol   . Myocardial infarction 2002    "before his lungs or legs" (05/05/2013)  . DVT (deep venous thrombosis)     "after back OR in August" (05/05/2013)  . Type II diabetes mellitus   . History of blood transfusion     "some since OR in 03/2013" (05/05/2013)  . Chronic lower back pain   . Gout   . BPH (benign prostatic hypertrophy)   . Prostate cancer     "S/P radiation tx" ((05/05/2013)  . Skin cancer of face     "had several taken off his face; had his nose cut/reconstructed" (05/05/2013)  . Shortness of breath    Past Surgical History:  Past Surgical History  Procedure Laterality Date  . Appendectomy    . Coronary artery bypass graft  11/09/2000    LIMA to LAD, SVG to RPDA, seq SVG to OM2 and D1 by [name] [facility]  . Lumbar fusion  03/15/2013    Dr Shon Baton  . Posterior lumbar fusion 4 level N/A 03/16/2013    Procedure: T9 - L3 POSTERIOR POSTERIOR SPINAL FUSION ;  Surgeon: Venita Lick, MD;  Location: [facility] OR;  Service: Orthopedics;  Laterality: N/A;  . Hardware removal N/A 03/30/2013    Procedure: HARDWARE REMOVAL/IRRIGATION & DEBRIDEMENT OF WOUND ON BACK;  Surgeon: Venita Lick, MD;  Location: [facility] OR;  Service: Orthopedics;  Laterality: N/A;  . Incision and drainage of wound N/A 04/13/2013    Procedure: IRRIGATION AND DEBRIDEMENT WOUND AND VAC DRESSING;  Surgeon: Venita Lick, MD;  Location: [facility] OR;  Service: Orthopedics;  Laterality: N/A;  . Application of wound vac  04/13/2013; 05/04/2013  . Back surgery    . Cataract extraction w/ intraocular lens  implant, bilateral Bilateral   . Secondary closure of  wound N/A 05/05/2013    Procedure: WASH-OUT CLOSURE OF WOUND PARTIAL;  Surgeon: Venita Lick, MD;  Location: [facility] OR;  Service: Orthopedics;  Laterality: N/A;  . Incision and drainage of wound N/A 05/18/2013    Procedure: REMOVAL WOUND VAC, IRRIGATION AND DEBRIDEMENT,  WOUND CLOSURE POSTERIOR THORACOLUMBAR INCISION  ;  Surgeon: Venita Lick, MD;  Location: [facility] OR;  Service: Orthopedics;  Laterality: N/A;   HPI:  [name] is a 79 y.o. 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 History is mostly obtained from chart review as patient was unable to provide a history and family was not present during the interview. He has been going to rehab facility as he was recently discharged on 05/24/13 for admission for lumbar spinal osteomyelitis. His nursing home claims that the patient was normal early this AM, but then became somewhat altered with difficulty speaking. The patient was brought to the ED and was found to be hypotensive to 66/42. A code stroke was initially called, but was then cancelled as the patient was out of the window and did not appear to have features consistent  with a stroke. CT head was performed and revealed no acute abnormality. The patient was given 3 L of NS in the ED with adequate BP response to fluids. On interviewing the patient, his BP was 100's/60's-70's. He appeared in no acute distress and was able to answer simple questions. He denies any recent nausea, vomiting, abdominal pain, chest pain, SOB, fever, or chills. Complicated answers were difficult to decipher as the patient had an extremely dry mouth/tongue, contributing to his inability to speak clearly. He did mention he had some back pain and recent diarrhea.   Assessment / Plan / Recommendation Clinical  Impression  Pt demonstrates adequate tolerance of thin liquids, as seen over multiple trials during pts acute stay in August. Mastication of soft solids is prolonged with oral residue and verbal cues needed to fully transit bolus. Recommend dys 2 diet with thin liquids, basic aspiration precautions and oral care QID due to severely dry oral mucosa. No SLP f/u needed.     Aspiration Risk  Mild    Diet Recommendation Dysphagia 2 (Fine chop);Thin liquid   Liquid Administration via: Cup;Straw Medication Administration: Whole meds with liquid Supervision: Staff to assist with self feeding Compensations: Slow rate;Small sips/bites Postural Changes and/or Swallow Maneuvers: Seated upright 90 degrees    Other  Recommendations Oral Care Recommendations: Oral care Q4 per protocol   Follow Up Recommendations  None    Frequency and Duration        Pertinent Vitals/Pain NA    SLP Swallow Goals     Swallow Study Prior Functional Status       General HPI: [name] is a 79 y.o. 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 History is mostly obtained from chart review as patient was unable to provide a history and family was not present during the interview. He has been going to rehab facility as he was recently discharged on 05/24/13 for admission for lumbar spinal osteomyelitis. His nursing home claims that the patient was normal early this AM, but then became somewhat altered with difficulty speaking. The patient was brought to the ED and was found to be hypotensive to 66/42. A code stroke was initially called, but was then cancelled as the patient was out of the window and did not appear to have features consistent with a stroke. CT head was performed and revealed no acute  abnormality. The patient was given 3 L of NS in the ED with adequate BP response to fluids. On interviewing the patient, his BP was 100's/60's-70's. He appeared in no acute distress and was able to answer simple questions. He denies any recent nausea, vomiting, abdominal pain, chest pain, SOB, fever, or chills. Complicated answers were difficult to decipher as the patient had an extremely dry mouth/tongue, contributing to his inability to speak clearly. He did mention he had some back pain and recent diarrhea. Type of Study: Bedside swallow evaluation Previous Swallow Assessment: BSE 8/14 - Dys 2/thin Diet Prior to this Study: NPO Temperature Spikes Noted: No Respiratory Status: Room air History of Recent Intubation: No Behavior/Cognition: Alert;Cooperative;Pleasant mood;Confused Oral Cavity - Dentition: Dentures, top;Poor condition;Missing dentition Self-Feeding Abilities: Needs assist Patient Positioning: Upright in bed Baseline Vocal Quality: Clear Volitional Cough: Strong Volitional Swallow: Able to elicit    Oral/Motor/Sensory Function Overall  Oral Motor/Sensory Function: Appears within functional limits for tasks assessed   Ice Chips     Thin Liquid Thin Liquid: Within functional limits    Nectar Thick Nectar Thick Liquid: Not tested   Honey Thick Honey Thick Liquid: Not tested   Puree Puree: Within functional limits   Solid   GO    Solid: Impaired Presentation: Self Fed Oral Phase Impairments: Impaired anterior to posterior transit Oral Phase Functional Implications: Oral residue;Oral holding      Harlon Ditty, MA CCC-SLP [phone]  Algenis Ballin, Riley Nearing 06/14/2013,4:21 PM

## 2013-06-15 ENCOUNTER — Encounter (HOSPITAL_COMMUNITY): Payer: Self-pay | Admitting: Radiology

## 2013-06-15 ENCOUNTER — Inpatient Hospital Stay (HOSPITAL_COMMUNITY): Payer: Medicare Other

## 2013-06-15 DIAGNOSIS — I10 Essential (primary) hypertension: Secondary | ICD-10-CM

## 2013-06-15 DIAGNOSIS — A0472 Enterocolitis due to Clostridium difficile, not specified as recurrent: Principal | ICD-10-CM

## 2013-06-15 DIAGNOSIS — M869 Osteomyelitis, unspecified: Secondary | ICD-10-CM

## 2013-06-15 LAB — BASIC METABOLIC PANEL
BUN: 7 mg/dL (ref 6–23)
CO2: 25 mEq/L (ref 19–32)
CO2: 26 mEq/L (ref 19–32)
Calcium: 8.1 mg/dL — ABNORMAL LOW (ref 8.4–10.5)
Chloride: 111 mEq/L (ref 96–112)
Chloride: 111 mEq/L (ref 96–112)
Creatinine, Ser: 0.52 mg/dL (ref 0.50–1.35)
GFR calc Af Amer: >90 mL/min (ref >90)
GFR calc Af Amer: >90 mL/min (ref >90)
GFR calc non Af Amer: >90 mL/min (ref >90)
Glucose, Bld: 109 mg/dL — ABNORMAL HIGH (ref 70–99)
Potassium: 2.8 mEq/L — ABNORMAL LOW (ref 3.5–5.1)
Sodium: 144 mEq/L (ref 135–145)
Sodium: 146 mEq/L — ABNORMAL HIGH (ref 135–145)

## 2013-06-15 LAB — BASIC METABOLIC PANEL WITH GFR
BUN: 8 mg/dL (ref 6–23)
CO2: 23 meq/L (ref 19–32)
Calcium: 8.1 mg/dL — ABNORMAL LOW (ref 8.4–10.5)
Chloride: 113 meq/L — ABNORMAL HIGH (ref 96–112)
Creatinine, Ser: 0.48 mg/dL — ABNORMAL LOW (ref 0.50–1.35)
GFR calc Af Amer: >90 mL/min
GFR calc non Af Amer: >90 mL/min
Glucose, Bld: 104 mg/dL — ABNORMAL HIGH (ref 70–99)
Potassium: 3.8 meq/L (ref 3.5–5.1)
Sodium: 146 meq/L — ABNORMAL HIGH (ref 135–145)

## 2013-06-15 LAB — URINE MICROSCOPIC-ADD ON

## 2013-06-15 LAB — CBC WITH DIFFERENTIAL/PLATELET
Basophils Relative: 0 % (ref 0–1)
Eosinophils Absolute: 0.2 10*3/uL (ref 0.0–0.7)
Hemoglobin: 10.3 g/dL — ABNORMAL LOW (ref 13.0–17.0)
Lymphocytes Relative: 9 % — ABNORMAL LOW (ref 12–46)
Lymphs Abs: 1 10*3/uL (ref 0.7–4.0)
MCHC: 33.2 g/dL (ref 30.0–36.0)
MCV: 86.4 fL (ref 78.0–100.0)
Monocytes Absolute: 0.6 10*3/uL (ref 0.1–1.0)
Monocytes Relative: 6 % (ref 3–12)
Neutro Abs: 9.2 10*3/uL — ABNORMAL HIGH (ref 1.7–7.7)
Neutrophils Relative %: 84 % — ABNORMAL HIGH (ref 43–77)
Platelets: 200 10*3/uL (ref 150–400)
RBC: 3.59 MIL/uL — ABNORMAL LOW (ref 4.22–5.81)
WBC: 11 10*3/uL — ABNORMAL HIGH (ref 4.0–10.5)

## 2013-06-15 LAB — GLUCOSE, CAPILLARY
Glucose-Capillary: 105 mg/dL — ABNORMAL HIGH (ref 70–99)
Glucose-Capillary: 107 mg/dL — ABNORMAL HIGH (ref 70–99)
Glucose-Capillary: 108 mg/dL — ABNORMAL HIGH (ref 70–99)
Glucose-Capillary: 110 mg/dL — ABNORMAL HIGH (ref 70–99)
Glucose-Capillary: 115 mg/dL — ABNORMAL HIGH (ref 70–99)
Glucose-Capillary: 154 mg/dL — ABNORMAL HIGH (ref 70–99)
Glucose-Capillary: 76 mg/dL (ref 70–99)

## 2013-06-15 LAB — URINALYSIS, ROUTINE W REFLEX MICROSCOPIC
Glucose, UA: NEGATIVE mg/dL
Ketones, ur: NEGATIVE mg/dL
Protein, ur: NEGATIVE mg/dL
Urobilinogen, UA: 0.2 mg/dL (ref 0.0–1.0)

## 2013-06-15 LAB — MAGNESIUM: Magnesium: 1.4 mg/dL — ABNORMAL LOW (ref 1.5–2.5)

## 2013-06-15 MED ORDER — IOHEXOL 300 MG/ML  SOLN
25.0000 mL | INTRAMUSCULAR | Status: AC
  Administered 2013-06-15: 25 mL via ORAL

## 2013-06-15 MED ORDER — CIPROFLOXACIN HCL 500 MG PO TABS
500.0000 mg | ORAL_TABLET | Freq: Two times a day (BID) | ORAL | Status: DC
  Administered 2013-06-15: 500 mg via ORAL
  Filled 2013-06-15 (×3): qty 1

## 2013-06-15 MED ORDER — POTASSIUM CHLORIDE 20 MEQ/15ML (10%) PO LIQD
40.0000 meq | Freq: Three times a day (TID) | ORAL | Status: DC
  Administered 2013-06-15: 40 meq via ORAL
  Filled 2013-06-15 (×4): qty 30

## 2013-06-15 MED ORDER — ENSURE COMPLETE PO LIQD
237.0000 mL | Freq: Two times a day (BID) | ORAL | Status: DC
  Administered 2013-06-15 – 2013-06-21 (×9): 237 mL via ORAL

## 2013-06-15 MED ORDER — IOHEXOL 300 MG/ML  SOLN
100.0000 mL | Freq: Once | INTRAMUSCULAR | Status: AC | PRN
  Administered 2013-06-15: 100 mL via INTRAVENOUS

## 2013-06-15 MED ORDER — VANCOMYCIN 50 MG/ML ORAL SOLUTION
125.0000 mg | Freq: Four times a day (QID) | ORAL | Status: DC
  Administered 2013-06-15 – 2013-06-20 (×22): 125 mg via ORAL
  Filled 2013-06-15 (×25): qty 2.5

## 2013-06-15 MED ORDER — MAGNESIUM SULFATE 40 MG/ML IJ SOLN
4.0000 g | Freq: Once | INTRAMUSCULAR | Status: AC
  Administered 2013-06-15: 4 g via INTRAVENOUS
  Filled 2013-06-15: qty 100

## 2013-06-15 MED ORDER — POTASSIUM CHLORIDE 10 MEQ/100ML IV SOLN
10.0000 meq | INTRAVENOUS | Status: AC
  Administered 2013-06-15 (×6): 10 meq via INTRAVENOUS
  Filled 2013-06-15 (×6): qty 100

## 2013-06-15 NOTE — Consult Note (Signed)
Provider Not In System Chief Complaint: Altered mental status History: Patient admitted from nsg home with question of stroke.  Patient with long medical history - well known to me.  Patient now on floor with mental status changes.  Asked to see patient from spine standpoint. Past Medical History  Diagnosis Date  . Hypertension   . Chronic kidney disease     bph  . Arthritis   . Coronary artery disease   . High cholesterol   . Myocardial infarction 2002    "before his lungs or legs" (05/05/2013)  . DVT (deep venous thrombosis)     "after back OR in August" (05/05/2013)  . Type II diabetes mellitus   . History of blood transfusion     "some since OR in 03/2013" (05/05/2013)  . Chronic lower back pain   . Gout   . BPH (benign prostatic hypertrophy)   . Prostate cancer     "S/P radiation tx" ((05/05/2013)  . Skin cancer of face     "had several taken off his face; had his nose cut/reconstructed" (05/05/2013)  . Shortness of breath     No Known Allergies  No current facility-administered medications on file prior to encounter.   Current Outpatient Prescriptions on File Prior to Encounter  Medication Sig Dispense Refill  . acetaminophen (TYLENOL) 325 MG tablet Take 650 mg by mouth every 6 (six) hours as needed for pain.      . Ascorbic Acid (VITAMIN C) 1000 MG tablet Take 2,000 mg by mouth daily.      [name] atorvastatin (LIPITOR) 10 MG tablet Take 10 mg by mouth daily.      . brimonidine (ALPHAGAN) 0.2 % ophthalmic solution Place 1 drop into both eyes 2 (two) times daily.      . Calcium Carbonate-Vitamin D (CALCIUM 600+D) 600-200 MG-UNIT TABS Take 1 tablet by mouth daily.      . cholecalciferol (VITAMIN D) 1000 UNITS tablet Take 1,000 Units by mouth daily.      . ciprofloxacin (CIPRO) 500 MG tablet Take 1 tablet (500 mg total) by mouth 2 (two) times daily.  70 tablet  0  . dextrose (GLUTOSE) 40 % GEL Take 1 Tube by mouth once as needed (for hypotension).      [name] dronabinol (MARINOL) 2.5  MG capsule Take 2.5 mg by mouth 3 (three) times daily.      . famotidine (PEPCID) 20 MG tablet Take 20 mg by mouth daily.      . feeding supplement (PRO-STAT SUGAR FREE 64) LIQD Take 30 mLs by mouth 2 (two) times daily.       . ferrous sulfate 325 (65 FE) MG tablet Take 325 mg by mouth 2 (two) times daily with a meal.      . furosemide (LASIX) 20 MG tablet Take 20 mg by mouth 2 (two) times daily.      [name] glucagon (GLUCAGEN) 1 MG SOLR injection Inject 1 mg into the vein once as needed.      [name] GLUCERNA (GLUCERNA) LIQD Take 237 mLs by mouth daily. Vanilla      . ipratropium-albuterol (DUONEB) 0.5-2.5 (3) MG/3ML SOLN Take 3 mLs by nebulization every 6 (six) hours as needed (for shortness of breath).      [name] lisinopril (PRINIVIL,ZESTRIL) 5 MG tablet Take 5 mg by mouth daily.      . Multiple Vitamins-Minerals (MULTIVITAMIN WITH MINERALS) tablet Take 1 tablet by mouth daily.      . potassium chloride (KLOR-CON) 20 MEQ  packet Take 20 mEq by mouth daily.       . sertraline (ZOLOFT) 50 MG tablet Take 50 mg by mouth daily.       . tamsulosin (FLOMAX) 0.4 MG CAPS Take 0.4 mg by mouth at bedtime.       . traMADol (ULTRAM) 50 MG tablet Take 1 tablet = 50 mg by mouth every 12 hours for pain; take 1 tablet = 50 mg every 8 hours as need for pain.  120 tablet  0  . zinc sulfate 220 MG capsule Take 220 mg by mouth daily.        Physical Exam: Filed Vitals:   06/15/13 1354  BP: 116/71  Pulse: 98  Temp: 98.8 F (37.1 C)  Resp: 18   Patient confused but does recognize me No sob/cp abd soft/nt Wound: dressing C/D/I Compartments soft/nt  Image: Ct Head Wo Contrast  06/13/2013   CLINICAL DATA:  Altered mental status. Code stroke.  EXAM: CT HEAD WITHOUT CONTRAST  TECHNIQUE: Contiguous axial images were obtained from the base of the skull through the vertex without intravenous contrast.  COMPARISON:  04/26/2013  FINDINGS: No mass lesion. No midline shift. No acute hemorrhage or hematoma. No extra-axial fluid  collections. No evidence of acute infarction. There is diffuse cerebral cortical and cerebellar atrophy with scattered periventricular lacunar infarcts as well as a small lacunar infarct in the central portion of the medulla on image number 8, unchanged. No acute osseous abnormalities.  IMPRESSION: No acute abnormality.  Atrophy and multiple old lacunar infarcts.  Critical Value/emergent results were called by telephone at the time of interpretation on 06/13/2013 at 2:45 PM to Dr.Reynolds , who verbally acknowledged these results.   Electronically Signed   By: Geanie Cooley M.D.   On: 06/13/2013 14:45   Dg Chest Portable 1 View  06/13/2013   CLINICAL DATA:  Altered mental status. Hypotension.  EXAM: PORTABLE CHEST - 1 VIEW  COMPARISON:  05/17/2013  FINDINGS: There is slight atelectasis at the lung bases, improved. Heart size and vascularity are normal. Large hiatal hernia, unchanged. No acute osseous abnormality.  IMPRESSION: Slight bibasilar atelectasis, improved.   Electronically Signed   By: Geanie Cooley M.D.   On: 06/13/2013 13:27   Dg Chest Port 1 View  05/17/2013   CLINICAL DATA:  Shortness of breath.  EXAM: PORTABLE CHEST - 1 VIEW  COMPARISON:  09/17 and 04/27/2013 and 04/11/2013  FINDINGS: There is persistent cardiomegaly with slight pulmonary vascular congestion. Bibasilar effusions and atelectasis are slightly improved. No acute osseous abnormality.  IMPRESSION: Slight improvement in the vascular congestion and bibasilar atelectasis and effusions.   Electronically Signed   By: Geanie Cooley   On: 05/17/2013 16:12    A/P:  Patient well known to me.  Has had issue with post-operative wound infection requiring multiple I+D's with delayed wound closure.  Patient seen in the office last week.  Wound appeared healthy - no drainage or signs of active infection.  Patient has had long standing issue with malnutrition. Plan:  1: spoke with Dr Yetta Barre - unlikely this is a stroke,  May need f/u MRI/CT 2: I  don't think the back wound is currently infected.  Recommend UA for further evaluation 3. No need for further surgical intervention at this time 4. ? Need for oral abxs.  Daughter concerned b/c this resulted in c.dif 5. Agree with wound care eval and treatment Will be available if questions arise

## 2013-06-15 NOTE — Clinical Social Work Placement (Addendum)
Clinical Social Work Department CLINICAL SOCIAL WORK PLACEMENT NOTE 06/15/2013  Patient:  [name] [name], [name] [name]  Account Number:  [id number] Admit date:  06/13/2013  Clinical Social Worker:  Genelle Bal, LCSW  Date/time:  06/15/2013 12:00 M  Clinical Social Work is seeking post-discharge placement for this patient at the following level of care:   SKILLED NURSING   (*CSW will update this form in Epic as items are completed)   06/15/2013  Patient/family provided with [facility] Department of Clinical Social Work's list of facilities offering this level of care within the geographic area requested by the patient (or if unable, by the patient's family).  06/15/2013  Patient/family informed of their freedom to choose among providers that offer the needed level of care, that participate in Medicare, Medicaid or managed care program needed by the patient, have an available bed and are willing to accept the patient.    Patient/family informed of [facility]' ownership interest in [facility], as well as of the fact that they are under no obligation to receive care at this facility.  PASARR submitted to EDS on 03/31/2013 PASARR number received from EDS on 03/31/2013 - [phone] [id number]  FL2 transmitted to all facilities in geographic area requested by pt/family on  06/15/2013 FL2 transmitted to all facilities within larger geographic area on 06/15/2013  Patient informed that his/her managed care company has contracts with or will negotiate with  certain facilities, including the following:     Patient/family informed of bed offers received: 06/15/13 & 06/17/13  Patient chooses bed at [facility] Physician recommends and patient chooses bed at    Patient to be transferred to [facility] on 06/21/2013   Patient to be transferred to facility by ambulance  The following physician request were entered in Epic:  Additional Comments: 06/17/13 - CSW and nurse case manager [employer] talked with  patient's daughter regarding d/c plans.Daughter given IM letter by case Production designer, theatre/television/film. Daughter advised by CSW that no new facility responses have come in. 06/20/13 - CSW talked with daughter and her brother [name] [name] regarding discharge plans. Daughter visited [place] and her decision remains [facility]. Daughter concerned about Tuesday discharge if patient continues to have diarrhea. Daughter's concerns passed on to MD when he came up to talk with family. 06/21/13 - Patient's son was at the bedside at time of discharge.

## 2013-06-15 NOTE — Consult Note (Signed)
[facility]     Reason for Consult: wound infection    Referring Physician: Dr. Kem Kays  Active Problems:   Dehydration   Hypovolemic shock   Sepsis   Speech abnormality   . atorvastatin  10 mg Oral q1800  . brimonidine  1 drop Both Eyes BID  . dronabinol  2.5 mg Oral TID  . feeding supplement (ENSURE COMPLETE)  237 mL Oral BID BM  . feeding supplement (PRO-STAT SUGAR FREE 64)  30 mL Oral BID  . ferrous sulfate  325 mg Oral BID WC  . heparin  5,000 Units Subcutaneous Q8H  . insulin aspart  0-9 Units Subcutaneous Q4H  . iohexol  25 mL Oral Q1 Hr x 2  . pantoprazole  40 mg Oral Daily  . potassium chloride  40 mEq Oral TID  . sertraline  50 mg Oral Daily  . sodium chloride  3 mL Intravenous Q12H  . vancomycin  125 mg Oral Q6H    Recommendations: Stop cipro Continue with po vancomycin for 2 weeks   Assessment: He developed a wound infection and possibly to bone, hardware failure with removal and required operative debridement with delayed closure now with two course of antibiotics including cefepime and about 1 month of ciprofloxacin.  He came in with low volume status and an elevated WBC that has improved with no new intervention.  I consider him treated for the wound infection with good granulation tissue and will stop ciprofloxacin.  He has had loose stools so would recommend treatment of C diff to complete 14 days after stopping cipro.     Antibiotics: cipro day 29 Cefepime 45 days  HPI: [name] [name] is a 79 y.o. male with CKD, CAD, DM, Prostate cancer with radiation therapy who presented on 10/27 with altered mental status.  Initial concern was for Stroke but found to be hypovolemic and hypotensive that has responded to fluid resuscitation.  He continues to have waxing and waning status.  He has a history of a wound infection in his back with hardware that was removed and a prolonged course of cefepime and then cipro.  No recent report of fever  or chills.  Seen by wound care and has noted good granulation tissue.  He also developed C diff and has remained on po vancomycin.  Initial WBC elevated but decreased with no specific intervention.     Review of Systems: unobtainable from the patient  Past Medical History  Diagnosis Date  . Hypertension   . Chronic kidney disease     bph  . Arthritis   . Coronary artery disease   . High cholesterol   . Myocardial infarction 2002    "before his lungs or legs" (05/05/2013)  . DVT (deep venous thrombosis)     "after back OR in August" (05/05/2013)  . Type II diabetes mellitus   . History of blood transfusion     "some since OR in 03/2013" (05/05/2013)  . Chronic lower back pain   . Gout   . BPH (benign prostatic hypertrophy)   . Prostate cancer     "S/P radiation tx" ((05/05/2013)  . Skin cancer of face     "had several taken off his face; had his nose cut/reconstructed" (05/05/2013)  . Shortness of breath     History  Substance Use Topics  . Smoking status: Never Smoker   . Smokeless tobacco: Never Used  . Alcohol Use: Yes     Comment: 05/05/2013 "used  to drink a little bit when he was younger"    Family History  Problem Relation Age of Onset  . Diabetes Mother   . Stroke Brother     x2   No Known Allergies  OBJECTIVE: Blood pressure 116/71, pulse 98, temperature 98.8 F (37.1 C), temperature source Oral, resp. rate 18, height 5\' 10"  (1.778 m), weight 153 lb 15.2 oz (69.83 kg), SpO2 90.00%. General: Awake, does not respond to questions Skin: no rasehs Lungs: CTA B Cor: RRR Abdomen: soft, nt, nd Ext: no edema  Microbiology: Recent Results (from the past 240 hour(s))  URINE CULTURE     Status: None   Collection Time    06/13/13  1:32 PM      Result Value Range Status   Specimen Description URINE, CATHETERIZED   Final   Special Requests NONE   Final   Culture  Setup Time     Final   Value: 06/13/2013 18:06     Performed at [facility] Count      Final   Value: NO GROWTH     Performed at [facility]   Culture     Final   Value: NO GROWTH     Performed at [facility]   Report Status 06/14/2013 FINAL   Final  CULTURE, BLOOD (ROUTINE X 2)     Status: None   Collection Time    06/13/13  2:00 PM      Result Value Range Status   Specimen Description BLOOD LEFT HAND   Final   Special Requests BOTTLES DRAWN AEROBIC ONLY 10CC   Final   Culture  Setup Time     Final   Value: 06/13/2013 19:46     Performed at [facility]   Culture     Final   Value:        BLOOD CULTURE RECEIVED NO GROWTH TO DATE CULTURE WILL BE HELD FOR 5 DAYS BEFORE ISSUING A FINAL NEGATIVE REPORT     Performed at [facility]   Report Status PENDING   Incomplete  CULTURE, BLOOD (ROUTINE X 2)     Status: None   Collection Time    06/13/13  2:30 PM      Result Value Range Status   Specimen Description BLOOD RIGHT HAND   Final   Special Requests BOTTLES DRAWN AEROBIC ONLY 10CC   Final   Culture  Setup Time     Final   Value: 06/13/2013 19:46     Performed at [facility]   Culture     Final   Value:        BLOOD CULTURE RECEIVED NO GROWTH TO DATE CULTURE WILL BE HELD FOR 5 DAYS BEFORE ISSUING A FINAL NEGATIVE REPORT     Performed at [facility]   Report Status PENDING   Incomplete  MRSA PCR SCREENING     Status: Abnormal   Collection Time    06/13/13  6:43 PM      Result Value Range Status   MRSA by PCR POSITIVE (*) NEGATIVE Final   Comment:            The GeneXpert MRSA Assay (FDA     approved for NASAL specimens     only), is one component of a     comprehensive MRSA colonization     surveillance program. It is not     intended to diagnose MRSA  infection nor to guide or     monitor treatment for     MRSA infections.     RESULT CALLED TO, READ BACK BY AND VERIFIED WITH:     Kennis Carina RN [id number] 06/13/13 A BROWNING  CLOSTRIDIUM DIFFICILE BY PCR     Status: None   Collection Time    06/14/13  9:17  AM      Result Value Range Status   C difficile by pcr NEGATIVE  NEGATIVE Final    Staci Righter, MD [facility] [email or web address] [phone] pager  [phone] cell 06/15/2013, 2:31 PM

## 2013-06-15 NOTE — Progress Notes (Signed)
INITIAL NUTRITION ASSESSMENT  DOCUMENTATION CODES Per approved criteria  -Severe malnutrition in the context of chronic illness   INTERVENTION:  1. D/C Glucerna Shake  2. Ensure Complete po BID, each supplement provides 350 kcal and 13 grams of protein.  3. Continue Prostat BID  NUTRITION DIAGNOSIS: Malnutrition related to chronic illness as evidenced by 27% weight loss x 2 1/2 months and severe muscle wasting.   Goal: Pt to meet >/= 90% of their estimated nutrition needs   Monitor:  PO intake, weight trend, labs, supplement accpetance  Reason for Assessment: Low Braden  79 y.o. male  Admitting Dx: <principal problem not specified>  ASSESSMENT: Pt admitted from SNF with slurred speech and weakness. Pt found to have volume depletion/dehydration. 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 This has been complicated by c.diff infection. Pt with a stage II surgical wound over spine and a stage I wound on his sacrum.  Per daughter pt used to eat well, especially sweets but has recently not been eating well. Daughter questions if SNF has been feeding pt. Per MD note daughter has not been happy with care provided at SNF and is looking to move pt at d/c to a different SNF.  Pt unable to provide any hx, pt was mouth breathing and would look at me but not answer any questions.   Nutrition Focused Physical Exam:  Subcutaneous Fat:  Orbital Region: WNL Upper Arm Region: swollen Thoracic and Lumbar Region: WNL  Muscle:  Temple Region: severe wasting Clavicle Bone Region: severe wasting Clavicle and Acromion Bone Region: severe wasting Scapular Bone Region: severe wasting Dorsal Hand: severe wasting Patellar Region: swollen Anterior Thigh Region: mild-moderate wasting Posterior Calf Region: mild-moderate wasting  Edema: present   Height: Ht Readings from Last 1 Encounters:  06/14/13 5\' 10"  (1.778 m)     Weight: Wt Readings from Last 1 Encounters:  06/14/13 153 lb 15.2 oz (69.83 kg)    Ideal Body Weight: 75.4 kg   % Ideal Body Weight: 92%  Wt Readings from Last 10 Encounters:  06/14/13 153 lb 15.2 oz (69.83 kg)  05/19/13 185 lb 10 oz (84.2 kg)  05/19/13 185 lb 10 oz (84.2 kg)  05/04/13 185 lb 9.6 oz (84.188 kg)  05/04/13 185 lb 9.6 oz (84.188 kg)  04/13/13 211 lb 6.7 oz (95.9 kg)  04/13/13 211 lb 6.7 oz (95.9 kg)  04/13/13 211 lb 6.7 oz (95.9 kg)  04/01/13 204 lb 12.9 oz (92.9 kg)  04/01/13 204 lb 12.9 oz (92.9 kg)    Usual Body Weight: 211 lb 8/14  % Usual Body Weight: 73%  BMI:  Body mass index is 22.09 kg/(m^2).  Estimated Nutritional Needs: Kcal: 1900-2100 Protein: 100-115 grams Fluid: > 2 L/day  Skin: stage I on sacrum; stage II on spine  Diet Order: Dysphagia 2 with Thin Liquids Meal Completion: 5%   EDUCATION NEEDS: -No education needs identified at this time   Intake/Output Summary (Last 24 hours) at 06/15/13 1151 Last data filed at 06/15/13 0823  Gross per 24 hour  Intake   3055 ml  Output    552 ml  Net   2503 ml    Last BM: 10/28 described as diarrhea   Labs:   Recent Labs Lab 06/13/13 1255 06/13/13 1315 06/14/13 0115 06/15/13 0710  NA 142 146* 139 144  K 3.3* 3.3* 3.6 2.8*  CL 105 102 106 111  CO2 31  --  24 25  BUN 9 8 8 7   CREATININE 0.84 1.10 0.62 0.54  CALCIUM 8.7  --  8.5 8.0*  MG  --   --   --  1.4*  GLUCOSE 107* 104* 97 109*    CBG (last 3)   Recent Labs  06/15/13 0033 06/15/13 0412 06/15/13 0723  GLUCAP 107* 82 110*    Scheduled Meds: . atorvastatin  10 mg Oral q1800  . brimonidine  1 drop Both Eyes BID  . ciprofloxacin  500 mg Oral BID  . dronabinol  2.5 mg Oral TID  . feeding supplement (GLUCERNA SHAKE)  237 mL Oral Daily  . feeding supplement (PRO-STAT SUGAR FREE 64)  30 mL Oral BID  . ferrous sulfate  325 mg Oral BID WC  . heparin  5,000 Units Subcutaneous Q8H  . insulin aspart  0-9 Units  Subcutaneous Q4H  . magnesium sulfate 1 - 4 g bolus IVPB  4 g Intravenous Once  . pantoprazole  40 mg Oral Daily  . potassium chloride  40 mEq Oral TID  . sertraline  50 mg Oral Daily  . sodium chloride  3 mL Intravenous Q12H  . vancomycin  125 mg Oral Q6H    Continuous Infusions: . dextrose 5 % and 0.9% NaCl 75 mL/hr at 06/15/13 1046    Past Medical History  Diagnosis Date  . Hypertension   . Chronic kidney disease     bph  . Arthritis   . Coronary artery disease   . High cholesterol   . Myocardial infarction 2002    "before his lungs or legs" (05/05/2013)  . DVT (deep venous thrombosis)     "after back OR in August" (05/05/2013)  . Type II diabetes mellitus   . History of blood transfusion     "some since OR in 03/2013" (05/05/2013)  . Chronic lower back pain   . Gout   . BPH (benign prostatic hypertrophy)   . Prostate cancer     "S/P radiation tx" ((05/05/2013)  . Skin cancer of face     "had several taken off his face; had his nose cut/reconstructed" (05/05/2013)  . Shortness of breath     Past Surgical History  Procedure Laterality Date  . Appendectomy    . Coronary artery bypass graft  11/09/2000    LIMA to LAD, SVG to RPDA, seq SVG to OM2 and D1 by [name] [facility]  . Lumbar fusion  03/15/2013    Dr Shon Baton  . Posterior lumbar fusion 4 level N/A 03/16/2013    Procedure: T9 - L3 POSTERIOR POSTERIOR SPINAL FUSION ;  Surgeon: Venita Lick, MD;  Location: [facility] OR;  Service: Orthopedics;  Laterality: N/A;  . Hardware removal N/A 03/30/2013    Procedure: HARDWARE REMOVAL/IRRIGATION & DEBRIDEMENT OF WOUND ON BACK;  Surgeon: Venita Lick, MD;  Location: [facility] OR;  Service: Orthopedics;  Laterality: N/A;  . Incision and drainage of wound N/A 04/13/2013    Procedure: IRRIGATION AND DEBRIDEMENT WOUND AND VAC DRESSING;  Surgeon: Venita Lick, MD;  Location: [facility] OR;  Service: Orthopedics;  Laterality: N/A;  . Application of wound vac  04/13/2013; 05/04/2013  . Back surgery     . Cataract extraction w/ intraocular lens  implant, bilateral Bilateral   . Secondary closure of wound N/A 05/05/2013    Procedure: WASH-OUT CLOSURE OF WOUND PARTIAL;  Surgeon: Venita Lick, MD;  Location: [facility] OR;  Service: Orthopedics;  Laterality: N/A;  . Incision and drainage of wound N/A 05/18/2013  Procedure: REMOVAL WOUND VAC, IRRIGATION AND DEBRIDEMENT,  WOUND CLOSURE POSTERIOR THORACOLUMBAR INCISION  ;  Surgeon: Venita Lick, MD;  Location: [facility] OR;  Service: Orthopedics;  Laterality: N/A;    Kendell Bane RD, LDN, CNSC [phone] Pager [phone] After Hours Pager

## 2013-06-15 NOTE — Progress Notes (Signed)
CRITICAL VALUE ALERT  Critical value received:  k 2.6  Date of notification:  06/15/2013  Time of notification:  1345  Critical value read back:yes  Nurse who received alert:  Waymon Budge RN  MD notified (1st page):  Dr. Yetta Barre  Time of first page:  1400  MD notified (2nd page):  Time of second page:  Responding MD:  Dr. Yetta Barre  Time MD responded:  [phone]

## 2013-06-15 NOTE — Progress Notes (Addendum)
Subjective: [name] [name] is a 79 y.o. 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 In the ED, the patient had significant HoTN (SBP into the 60's), and volume depletion. BP returned close to normal after 3 L bolus.   Seen at bedside this AM. Patient still agitated and moving both upper and lower extremities. Night nurse said the patient did not sleep throughout the night. When speaking with the patient at bedside, he would frequently fall asleep in the middle of questioning. Still appears to need further fluid repletion.  Patient denies any pain, but on palpation of the abdomen, tenderness was noted. No fever, chills, nausea, vomiting, chest pain, or SOB.   Objective: Vital signs in last 24 hours: Filed Vitals:   06/14/13 2121 06/15/13 0418 06/15/13 0700 06/15/13 1033  BP: 164/85 150/81 162/84   Pulse: 99 98 89   Temp: 98.3 F (36.8 C) 98.3 F (36.8 C) 98.2 F (36.8 C) 97.5 F (36.4 C)  TempSrc: Oral Oral  Oral  Resp: 17 18 18    Height: 5\' 10"  (1.778 m)     Weight: 153 lb 15.2 oz (69.83 kg)     SpO2: 94% 90% 92%    Weight change: 3 lb 9.2 oz (1.62 kg)  Intake/Output Summary (Last 24 hours) at 06/15/13 1302 Last data filed at 06/15/13 0981  Gross per 24 hour  Intake   3055 ml  Output    552 ml  Net   2503 ml   Physical Exam: General: Alert, cooperative, and in mild distress; agitated.  HEENT: Vision grossly intact, mouth still dry w/ red crusted tongue, lips chapped.  Neck: Full range of motion without pain, supple, no lymphadenopathy or carotid bruits Lungs: Clear to ascultation bilaterally, normal work of respiration, no wheezes, rales, ronchi Heart: Regular rate and rhythm, no murmurs, gallops, or rubs Abdomen: Soft, tender to palpation in LU and LLQ,  non-distended, normal bowel sounds Extremities: No cyanosis, clubbing, or edema Back: With healing surgical wounds, does not appear infected. Granulation tissue present, no pus or bleeding present, no foul odor.  Neurologic: Alert & oriented x2, cranial nerves II-XII intact, strength grossly intact, sensation intact to light touch  Lab Results: Basic Metabolic Panel:  Recent Labs Lab 06/14/13 0115 06/15/13 0710  NA 139 144  K 3.6 2.8*  CL 106 111  CO2 24 25  GLUCOSE 97 109*  BUN 8 7  CREATININE 0.62 0.54  CALCIUM 8.5 8.0*  MG  --  1.4*   Liver Function Tests:  Recent Labs Lab 06/13/13 1255  AST 21  ALT 16  ALKPHOS 95  BILITOT 0.9  PROT 6.1  ALBUMIN 2.1*   CBC:  Recent Labs Lab 06/13/13 1255  06/14/13 1056 06/15/13 0710  WBC 8.1  < > 16.6* 11.0*  NEUTROABS 6.6  --   --  9.2*  HGB 11.3*  < > 10.6* 10.3*  HCT 34.9*  < > 31.4* 31.0*  MCV 89.0  < > 87.2 86.4  PLT 194  < > 181 200  < > = values in this interval not displayed.  Cardiac Enzymes:  Recent Labs Lab 06/13/13 1255 06/13/13 2000 06/14/13 0115  TROPONINI <0.30 <0.30 <0.30   CBG:  Recent Labs Lab 06/14/13 1709 06/14/13 2114 06/15/13 0033 06/15/13 1914  06/15/13 0723 06/15/13 1130  GLUCAP 112* 105* 107* 82 110* 115*   Hemoglobin A1C:  Recent Labs Lab 06/14/13 0115  HGBA1C 5.1   Fasting Lipid Panel: No results found for this basename: CHOL, HDL, LDLCALC, TRIG, CHOLHDL, LDLDIRECT,  in the last 168 hours Thyroid Function Tests: No results found for this basename: TSH, T4TOTAL, FREET4, T3FREE, THYROIDAB,  in the last 168 hours Coagulation:  Recent Labs Lab 06/13/13 1255  LABPROT 19.1*  INR 1.66*   Anemia Panel: No results found for this basename: VITAMINB12, FOLATE, FERRITIN, TIBC, IRON, RETICCTPCT,  in the last 168 hours Urine Drug Screen: Drugs of Abuse     Component Value Date/Time   LABOPIA NONE DETECTED 06/13/2013 1332   COCAINSCRNUR NONE DETECTED 06/13/2013 1332    LABBENZ NONE DETECTED 06/13/2013 1332   AMPHETMU NONE DETECTED 06/13/2013 1332   THCU POSITIVE* 06/13/2013 1332   LABBARB NONE DETECTED 06/13/2013 1332    Alcohol Level:  Recent Labs Lab 06/13/13 1255  ETH <11   Urinalysis:  Recent Labs Lab 06/13/13 1332  COLORURINE AMBER*  LABSPEC 1.020  PHURINE 6.0  GLUCOSEU NEGATIVE  HGBUR SMALL*  BILIRUBINUR NEGATIVE  KETONESUR NEGATIVE  PROTEINUR 30*  UROBILINOGEN 0.2  NITRITE NEGATIVE  LEUKOCYTESUR MODERATE*    Micro Results: Recent Results (from the past 240 hour(s))  URINE CULTURE     Status: None   Collection Time    06/13/13  1:32 PM      Result Value Range Status   Specimen Description URINE, CATHETERIZED   Final   Special Requests NONE   Final   Culture  Setup Time     Final   Value: 06/13/2013 18:06     Performed at [facility] Count     Final   Value: NO GROWTH     Performed at [facility]   Culture     Final   Value: NO GROWTH     Performed at [facility]   Report Status 06/14/2013 FINAL   Final  CULTURE, BLOOD (ROUTINE X 2)     Status: None   Collection Time    06/13/13  2:00 PM      Result Value Range Status   Specimen Description BLOOD LEFT HAND   Final   Special Requests BOTTLES DRAWN AEROBIC ONLY 10CC   Final   Culture  Setup Time     Final   Value: 06/13/2013 19:46     Performed at [facility]   Culture     Final   Value:        BLOOD CULTURE RECEIVED NO GROWTH TO DATE CULTURE WILL BE HELD FOR 5 DAYS BEFORE ISSUING A FINAL NEGATIVE REPORT     Performed at [facility]   Report Status PENDING   Incomplete  CULTURE, BLOOD (ROUTINE X 2)     Status: None   Collection Time    06/13/13  2:30 PM      Result Value Range Status   Specimen Description BLOOD RIGHT HAND   Final   Special Requests BOTTLES DRAWN AEROBIC ONLY 10CC   Final   Culture  Setup Time     Final   Value: 06/13/2013 19:46     Performed at [facility]   Culture     Final    Value:        BLOOD CULTURE RECEIVED NO GROWTH TO DATE CULTURE WILL BE HELD FOR 5 DAYS BEFORE ISSUING A  FINAL NEGATIVE REPORT     Performed at [facility]   Report Status PENDING   Incomplete  MRSA PCR SCREENING     Status: Abnormal   Collection Time    06/13/13  6:43 PM      Result Value Range Status   MRSA by PCR POSITIVE (*) NEGATIVE Final   Comment:            The GeneXpert MRSA Assay (FDA     approved for NASAL specimens     only), is one component of a     comprehensive MRSA colonization     surveillance program. It is not     intended to diagnose MRSA     infection nor to guide or     monitor treatment for     MRSA infections.     RESULT CALLED TO, READ BACK BY AND VERIFIED WITH:     Kennis Carina RN [id number] 06/13/13 A BROWNING  CLOSTRIDIUM DIFFICILE BY PCR     Status: None   Collection Time    06/14/13  9:17 AM      Result Value Range Status   C difficile by pcr NEGATIVE  NEGATIVE Final   Studies/Results: Ct Head Wo Contrast  06/13/2013   CLINICAL DATA:  Altered mental status. Code stroke.  EXAM: CT HEAD WITHOUT CONTRAST  TECHNIQUE: Contiguous axial images were obtained from the base of the skull through the vertex without intravenous contrast.  COMPARISON:  04/26/2013  FINDINGS: No mass lesion. No midline shift. No acute hemorrhage or hematoma. No extra-axial fluid collections. No evidence of acute infarction. There is diffuse cerebral cortical and cerebellar atrophy with scattered periventricular lacunar infarcts as well as a small lacunar infarct in the central portion of the medulla on image number 8, unchanged. No acute osseous abnormalities.  IMPRESSION: No acute abnormality.  Atrophy and multiple old lacunar infarcts.  Critical Value/emergent results were called by telephone at the time of interpretation on 06/13/2013 at 2:45 PM to Dr.Reynolds , who verbally acknowledged these results.   Electronically Signed   By: Geanie Cooley M.D.   On: 06/13/2013 14:45    Medications: I have reviewed the patient's current medications. Scheduled Meds: . atorvastatin  10 mg Oral q1800  . brimonidine  1 drop Both Eyes BID  . ciprofloxacin  500 mg Oral BID  . dronabinol  2.5 mg Oral TID  . feeding supplement (ENSURE COMPLETE)  237 mL Oral BID BM  . feeding supplement (PRO-STAT SUGAR FREE 64)  30 mL Oral BID  . ferrous sulfate  325 mg Oral BID WC  . heparin  5,000 Units Subcutaneous Q8H  . insulin aspart  0-9 Units Subcutaneous Q4H  . pantoprazole  40 mg Oral Daily  . potassium chloride  40 mEq Oral TID  . sertraline  50 mg Oral Daily  . sodium chloride  3 mL Intravenous Q12H  . vancomycin  125 mg Oral Q6H   Continuous Infusions: . dextrose 5 % and 0.9% NaCl 75 mL/hr at 06/15/13 1046   PRN Meds:.acetaminophen, acetaminophen, albuterol, ipratropium, ondansetron (ZOFRAN) IV, ondansetron, zinc oxide  Assessment/Plan: [name] [name] is a 79 y.o. 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 In the ED, the patient had significant HoTN (SBP into the  60's), and volume depletion. BP returned close to normal after 3 L bolus.   Slurred speech/weakness- Patient is elderly gentleman brought from NH, originally a code stroke. The patient was out of the window, symptoms did not correlate with a stroke, and CT on admission was -ve for acute abnormality. Code Stroke was cancelled. After interviewing the patient, it does not appear that the patient had a CVA/TIA as he has full strength in upper and lower extremities, no facial weakness, numbness, and is A&O x2. It appears that these symptoms may be d/t significant dehydration and volume depletion as the patient was significantly hypotensive on admission. The patient has such severe dryness of his mucus membranes  that his tongue is dry and crusted, making his speech difficult to understand. Ischemic disease, however, is still a possibility and will reconsider if patient shows signs of neurological change. Patient's mental status with no improvement, still agitated, with no sleep overnight. Still with garbled speech.  -Will obtain CT head w/out contrast to assess for new dynamic changes in relation to admission CT (showed no acute abnormalities).  -CT abdomen/pelvis w/ contrast to assess for possible intra-abdominal pathology given the patient's leukocytosis and abdominal pain on exam today. -ID consulted to assess for other possibilities of infection; appreciate the consult. D/c'ed Cipro for wound infection as patient has almost completed full course at this time. Will continue another 14 days of Vanco po for C. Dif as patient still has loose stools and now Cipro has been stopped.  -Discussed case w/ Dr. Shon Baton (orthopedics) who performed multiple spinal surgeries. Feels that surgical site is not infected at this time. Agrees with current plan.  -Neuro checks q4h  -PT eval recommends SNF placement -SLP eval recommends dysphagia 2 diet. -Will continue D5 NS @ 75 ml/hr for now. -Fall precautions   Hypotension- Patient presented with BP in ED of 66/42, most likely 2/2 volume depletion, however sepsis cannot be completely ruled out. However, patient w/out leukocytosis, afebrile. CXR shows slight bibasilar atelectasis, but improved from last admission. UA shows moderate leukocytosis, and few bacteria, however, the patient is currently on Cipro 500 mg for previous lumbar wound infection. Patient also with complicated previous admission w/ C. Dif and patient still with some diarrhea. On oral Vancomycin (liquid) at home. -BP stable at this time. Will hold BP meds for now.  -Continue D5NS @ 75 ml/hr  Hypokalemia- Patient with K of 2.8 this AM, repeat of 2.6. On review of NH reports, patient has a history of hypokalemia  in the past.  -Patient given 1 dose of K po liquid, 40 mEq. Patient did not tolerate oral K well. -Will give 6x runs of 10 mEq K. Will reassess BMET later this evening. Will replete as necessary at that time. -Will also repeat BMET in the AM  Leukocytosis- Patient presented to the ED without increased WBC's. However, yesterday in the AM, WBC's were 19.8 w/ repeat of 16.6. Source of possible infection unclear. CXR showed no signs of infiltrate.The patient did complain of some dysuria today with foley in place and on admission, urine showed moderate leukocytes and few bacteria, however, urine culture shows no growth at this time. On examination of his back wounds, does not show signs of infection, only granulation tissue. No significant tenderness to palpation. Patient does have a h/o C. Dif and does complain of having diarrhea still at this point.  -WBC's 11.0 at this time.  -ID consulted; recommended stopping Cipro and continuing Vanco po for 14 days (  see above for further details). -Condom catheter in place.  -Will repeat UA/UCx -Wound care consulted, do not feel wound is infected at this time, replaced dressing.   DM type II- Most recent A1c 5.2 in 03/2013.  -Patient with poor po intake. WIll continue with D5 at this time. -On ISS sensitive for now.  -CBG's q4h for now while patient is on D5 NS   HTN- Home medications- Lasix 20 mg bid, Lisinopril 5 mg po qd  -Will continue to hold home meds for now.  CAD- Patient with CABG in 2002 w/ HoTN. Patient denies chest pain, SOB, diaphoresis, nausea, vomiting.  -Troponin x 3 negative  -EKG performed, shows no dynamic changes   HLD- Most recent lipid profile wnl.  -Continue statin   Gout- Stable, not on any medications at this time.   BPH/Prostate CA- No issues at this time. Had radiation therapy 05/05/13.  -Hold Flomax for now given HoTN   Dispo: Disposition is deferred at this time, awaiting improvement of current medical problems.   Anticipated discharge in approximately 2-3 day(s).  -Patient from [facility]. Had very long discussion with daughter today about his recent medical history and care. She states that he has been poorly cared for at [facility] and would like another placement, but claims this was a problem previously given his C. Dif infection.  -Will discuss further with SW in terms of placement.   The patient does not have a current PCP (Provider Not In System) and does need an [facility] hospital follow-up appointment after discharge.  The patient does have transportation limitations that hinder transportation to clinic appointments.  .Services Needed at time of discharge: Y = Yes, Blank = No PT:   OT:   RN:   Equipment:   Other:     LOS: 2 days   Courtney Paris, MD 06/15/2013, 1:02 PM Pager: [phone]

## 2013-06-15 NOTE — Evaluation (Signed)
Physical Therapy Evaluation Patient Details Name: [name] [name] MRN: [id number] DOB: 16-Jun-1934 Today's Date: 06/15/2013 Time: 4098-1191 PT Time Calculation (min): 16 min  PT Assessment / Plan / Recommendation History of Present Illness    [name] [name] is a 79 y.o. male with CKD, CAD, DM, Prostate cancer with radiation therapy who presented on 10/27 with altered mental status.  Initial concern was for Stroke but found to be hypovolemic and hypotensive that has responded to fluid resuscitation. Pt was recently discharged to SNF prior to hospitalization due to hardware infection and multiple I&Ds with delayed wound closure after multiple thoracolumbar surgeries.    Clinical Impression  Pt adm due to the above and presents with mobility and strength deficits see below. Pt to benefit from skilled PT in acute setting to address deficits listed below (see PT problem list). Pt limited in evaluation today due to dizziness sitting EOB. Pt will require 2+ to attempt transfers. Will recommend SNF upon acute D/C for continued rehab.     PT Assessment  Patient needs continued PT services    Follow Up Recommendations  SNF    Does the patient have the potential to tolerate intense rehabilitation      Barriers to Discharge Decreased caregiver support      Equipment Recommendations  Other (comment) (TBD at SNF)    Recommendations for Other Services     Frequency Min 3X/week    Precautions / Restrictions Precautions Precautions: Fall Restrictions Weight Bearing Restrictions: No   Pertinent Vitals/Pain No c/o pain.       Mobility  Bed Mobility Bed Mobility: Supine to Sit;Sitting - Scoot to Edge of Bed;Sit to Supine;Scooting to [facility] Supine to Sit: 3: Mod assist;HOB elevated;With rails Sitting - Scoot to Edge of Bed: 2: Max assist Sit to Supine: 3: Mod assist;HOB flat;With rail Scooting to [facility]: 2: Max assist;With rail Details for Bed Mobility Assistance: pt requires increased time to  complete bed mobility; demo difficulities with bed mobility and requires multimodal max cues for sequencing and (A) to bring trunk to sitting position at EOB; pt c/o dizziniess when sitting EOB and began thrusting posteriorly to return to suprnie Transfers Transfers: Not assessed (pt limited by dizziniess; need +2 for attempt) Ambulation/Gait Ambulation/Gait Assistance: Not tested (comment) Stairs: No Wheelchair Mobility Wheelchair Mobility: No    Exercises General Exercises - Lower Extremity Ankle Circles/Pumps: AROM;Both;10 reps Short Arc QuadBarbaraann Boys;Both;10 reps;Supine Heel Slides: AAROM;Both;10 reps;Supine Straight Leg Raises: AAROM;Both;5 reps;Supine   PT Diagnosis: Difficulty walking;Generalized weakness  PT Problem List: Decreased strength;Decreased activity tolerance;Decreased balance;Decreased mobility;Decreased knowledge of use of DME;Decreased safety awareness PT Treatment Interventions: DME instruction;Gait training;Functional mobility training;Therapeutic activities;Therapeutic exercise;Balance training;Neuromuscular re-education;Patient/family education     PT Goals(Current goals can be found in the care plan section) Acute Rehab PT Goals Patient Stated Goal: not stated PT Goal Formulation: With patient Time For Goal Achievement: 06/29/13 Potential to Achieve Goals: Fair  Visit Information  Last PT Received On: 06/15/13 Assistance Needed: +2       Prior Functioning  Home Living Family/patient expects to be discharged to:: Skilled nursing facility Living Arrangements: Spouse/significant other Additional Comments: pt originaly from [facility] ALF/ILF; pt has been at [facility] since last D/C per RN; no family present at this time Prior Function Level of Independence: Independent Comments: Prior to initial back surgery  Communication Communication: HOH Dominant Hand: Right    Cognition  Cognition Arousal/Alertness: Lethargic;Suspect due to  medications Behavior During Therapy: [facility] for tasks assessed/performed Overall Cognitive Status:  Within Functional Limits for tasks assessed (pt answered questions appropriately with incr time) Area of Impairment: Safety/judgement Safety/Judgement: Decreased awareness of safety General Comments: pt seemed unaware of implications of thrusting posteriorly sittin EOB when he c/o dizziniess     Extremity/Trunk Assessment Upper Extremity Assessment Upper Extremity Assessment: Defer to OT evaluation Lower Extremity Assessment Lower Extremity Assessment: Generalized weakness Cervical / Trunk Assessment Cervical / Trunk Assessment: Kyphotic   Balance Balance Balance Assessed: Yes Static Sitting Balance Static Sitting - Balance Support: Bilateral upper extremity supported;Feet unsupported Static Sitting - Level of Assistance: 3: Mod assist Static Sitting - Comment/# of Minutes: pt tolerated sitting EOB <54min; began thrusting posteriorly to return to supine; c/o dizziniess  End of Session PT - End of Session Equipment Utilized During Treatment: Other (comment) (pt had protective mitts on bil UEs) Activity Tolerance: Patient limited by fatigue;Other (comment) (limited by dizziness) Patient left: in bed;with call bell/phone within reach;Other (comment) (infectious MD in room ) Nurse Communication: Mobility status  GP     [name], [place] [phone] 06/15/2013, 3:09 PM

## 2013-06-15 NOTE — Progress Notes (Signed)
Spoke with MD concerning something for sleep Day RN stated that PT has not slept in 2 night MD did not want to give medications for this so will continue to monitor. Ilean Skill LPN

## 2013-06-15 NOTE — Progress Notes (Signed)
Pt a/o slurred speech, no c/o pain, pt has mepilex to back wound changed by [facility] RN, pt also had red/blanchable area to sacrum, pt turned and repositioned Q2 hours.  Daughter at bedside for the afternoon.  Pt had K of 2.6 Dr. Yetta Barre notified and 6 runs of K ordered, during one run of K, daughter noticed IV leaking, new IV restarted which then infiltrated, original IV restored and K is running, pt has bilat mits to prevent from pulling out IVs, pt stable, will continue to monitor

## 2013-06-15 NOTE — Consult Note (Addendum)
WOC wound consult note Reason for Consult: Consult requested for back wound.  Pt has history of previous back wound and surgery.   Wound type:Full thickness post-op wound Measurement:Previous incision line located over entire spinal area; pink dry scar tissue except in 3 locations. Wound bed: Lower previous incision line with full thickness wound; 1X.2X1cm.  No odor, mod amt yellow drainage, bone palpable when swab inserted.   2 areas on either side of middle previous incision line, each partial thickness .1X.1cm yellow slough Periwound: Entire area surrounding previous incision had tape and dressing which has caused medical adhesive related skin damage; area is red and macerated.  Dressing procedure/placement/frequency: Foam dressing to minimize skin stripping from tape.  Aquacel to open wound to provide antimicrobial benefits and absorb drainage.  PLEASE REFER TO DR Shon Baton who performed surgery if further assessment and plan of care is desired. Please re-consult if further assistance is needed.  Thank-you,  Cammie Mcgee MSN, RN, CWOCN, [place], CNS [phone]

## 2013-06-15 NOTE — Clinical Social Work Psychosocial (Addendum)
Clinical Social Work Department BRIEF PSYCHOSOCIAL ASSESSMENT 06/15/2013  Patient:  [name],[name]     Account Number:  [id number]     Admit date:  06/13/2013  Clinical Social Worker:  Delmer Islam  Date/Time:  06/15/2013 12:52 PM  Referred by:    Date Referred:  06/14/2013 Referred for  SNF Placement   Other Referral:   Interview type:  Family Other interview type:   CSW spoke with patient's daughter [name] [name] [phone])    PSYCHOSOCIAL DATA Living Status:  FACILITY Admitted from facility:  [facility] Level of care:   Primary support name:  [name] [name] Primary support relationship to patient:  CHILD, ADULT Degree of support available:   Daughter is very concerned about patient's well-being and care in the hospital and at the nursing facility. Patient's wife is also in a facility.  Daughter gave her husband [name] [name] as a contact [phone]).    CURRENT CONCERNS Current Concerns  Post-Acute Placement   Other Concerns:    SOCIAL WORK ASSESSMENT / PLAN CSW talked with daughter, who was a the bedside about discharge planning. Daughter explained that her father was in [facility] and she explained why she was not pleased with his care. Daughter aware that patient continues to need skilled nursing care, however does not want patient to return to previous facility. CSW explained SNF search and provided daughter with list of skilled nursing facilities for [place] and [place] counties.   Assessment/plan status:  Psychosocial Support/Ongoing Assessment of Needs Other assessment/ plan:   Information/referral to community resources:   Skilled nursing facility list for [place] [employer] [place] counties.    PATIENT'S/FAMILY'S RESPONSE TO PLAN OF CARE: Daughter very receptive to talking with CSW regarding discharge plans. She is very concerned about patient being discharged from hospital too soon.

## 2013-06-15 NOTE — Progress Notes (Signed)
  Date: 06/15/2013  Patient name: [name] [name]  Medical record number: [id number]  Date of birth: Sep 04, 1933   This patient has been seen and the plan of care was discussed with the house staff. Please see their note for complete details. I concur with their findings with the following additions/corrections: Daughter at bedside today. She states her dad has had episodes of delirium over past few months since his back surgery. She states over the past week he is definitely not himself. He is complaining of abdominal pain and on exam has LLQ tenderness.  He was noted to be very dry on admission and is still clinically dry. Continue IVF. Replace K. Replace Mg. Obtain a CT of Abd/pelvis with IV contrast. SLP consult to evaluate swallowing safety. Consult ID regarding his wound and recent diagnosis of osteomyelitis (length of therapy changes?). I would repeat CT brain w/o contrast today to see if he has any evidence of a recent subacute/acute infarct and compare to one on admission.  Consult his orthopedic surgeon who completed his surgery to evaluate back wound. For now, continue PO vanc PO Cipro as was previously recommended.   Jonah Blue, DO, FACP Faculty [facility] Internal Medicine Residency Program 06/15/2013, 1:00 PM

## 2013-06-16 ENCOUNTER — Encounter (HOSPITAL_BASED_OUTPATIENT_CLINIC_OR_DEPARTMENT_OTHER): Payer: Medicare Other

## 2013-06-16 DIAGNOSIS — E43 Unspecified severe protein-calorie malnutrition: Secondary | ICD-10-CM

## 2013-06-16 DIAGNOSIS — M869 Osteomyelitis, unspecified: Secondary | ICD-10-CM

## 2013-06-16 LAB — URINALYSIS, ROUTINE W REFLEX MICROSCOPIC
Bilirubin Urine: NEGATIVE
Glucose, UA: NEGATIVE mg/dL
Hgb urine dipstick: NEGATIVE
Ketones, ur: NEGATIVE mg/dL
Leukocytes, UA: NEGATIVE
Nitrite: NEGATIVE
Protein, ur: NEGATIVE mg/dL
Specific Gravity, Urine: 1.042 — ABNORMAL HIGH (ref 1.005–1.030)
Urobilinogen, UA: 0.2 mg/dL (ref 0.0–1.0)

## 2013-06-16 LAB — BASIC METABOLIC PANEL
BUN: 7 mg/dL (ref 6–23)
Chloride: 113 mEq/L — ABNORMAL HIGH (ref 96–112)
Creatinine, Ser: 0.5 mg/dL (ref 0.50–1.35)
GFR calc Af Amer: >90 mL/min (ref >90)
GFR calc non Af Amer: >90 mL/min (ref >90)
Potassium: 3.7 mEq/L (ref 3.5–5.1)
Sodium: 147 mEq/L — ABNORMAL HIGH (ref 135–145)

## 2013-06-16 LAB — GLUCOSE, CAPILLARY
Glucose-Capillary: 109 mg/dL — ABNORMAL HIGH (ref 70–99)
Glucose-Capillary: 110 mg/dL — ABNORMAL HIGH (ref 70–99)
Glucose-Capillary: 123 mg/dL — ABNORMAL HIGH (ref 70–99)

## 2013-06-16 LAB — CBC
MCHC: 32.4 g/dL (ref 30.0–36.0)
MCV: 87.9 fL (ref 78.0–100.0)
Platelets: 178 10*3/uL (ref 150–400)
RDW: 16.4 % — ABNORMAL HIGH (ref 11.5–15.5)
WBC: 8.7 10*3/uL (ref 4.0–10.5)

## 2013-06-16 LAB — MAGNESIUM: Magnesium: 2.1 mg/dL (ref 1.5–2.5)

## 2013-06-16 MED ORDER — MUPIROCIN 2 % EX OINT
1.0000 "application " | TOPICAL_OINTMENT | Freq: Two times a day (BID) | CUTANEOUS | Status: DC
  Administered 2013-06-16 – 2013-06-20 (×9): 1 via NASAL
  Filled 2013-06-16: qty 22

## 2013-06-16 MED ORDER — DEXTROSE-NACL 5-0.45 % IV SOLN
INTRAVENOUS | Status: DC
  Administered 2013-06-16 – 2013-06-20 (×8): via INTRAVENOUS

## 2013-06-16 MED ORDER — CHLORHEXIDINE GLUCONATE CLOTH 2 % EX PADS
6.0000 | MEDICATED_PAD | Freq: Every day | CUTANEOUS | Status: AC
  Administered 2013-06-16 – 2013-06-20 (×5): 6 via TOPICAL

## 2013-06-16 MED ORDER — DRONABINOL 2.5 MG PO CAPS
5.0000 mg | ORAL_CAPSULE | Freq: Three times a day (TID) | ORAL | Status: DC
  Administered 2013-06-16 – 2013-06-19 (×7): 5 mg via ORAL
  Filled 2013-06-16 (×9): qty 2

## 2013-06-16 NOTE — Progress Notes (Signed)
Subjective: [name] is a 79 y.o. 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 In the ED, the patient had significant HoTN (SBP into the 60's), and volume depletion. BP returned close to normal after 3 L bolus.   Seen at bedside this AM. Doing better today, volume status is improved. Still without sleep overnight. Worked with PT this AM and moved to chair. Was smiling and happy to be out of bed. The patient is still able to appropriately answer questions and denies any discomfort at this time. He denies nausea, vomiting, abdominal pain, dysuria, SOB, or chest pain.   Objective: Vital signs in last 24 hours: Filed Vitals:   06/15/13 2014 06/16/13 0422 06/16/13 1105 06/16/13 1355  BP: 152/83 164/85 155/82 115/69  Pulse: 95 86 81 88  Temp: 97.6 F (36.4 C) 97.5 F (36.4 C) 97.8 F (36.6 C) 97.4 F (36.3 C)  TempSrc: Oral Oral Oral Oral  Resp: 18 18 18 18   Height:      Weight: 153 lb 15.2 oz (69.831 kg)     SpO2: 93% 94% 93% 94%   Weight change: 0 oz (0.001 kg)  Intake/Output Summary (Last 24 hours) at 06/16/13 1509 Last data filed at 06/16/13 1300  Gross per 24 hour  Intake 2413.33 ml  Output    350 ml  Net 2063.33 ml   Physical Exam: General: Alert, cooperative, and no acute distress.   HEENT: Vision grossly intact, mouth still somewhat dry w/ red crusted tongue, lips chapped, but improved.  Neck: Full range of motion without pain, supple, no lymphadenopathy or carotid bruits Lungs: Clear to ascultation bilaterally, normal work of respiration, no wheezes, rales, ronchi Heart: Regular rate and rhythm, no murmurs, gallops, or rubs Abdomen: Soft, non-tender to palpation, non-distended, normal bowel sounds Extremities: No cyanosis, clubbing, or edema Back: With  healing surgical wounds, does not appear infected. Granulation tissue present, no pus or bleeding present, no foul odor. Re-dressed by wound care yesterday. Neurologic: Alert & oriented x3, cranial nerves II-XII intact, strength grossly intact, sensation intact to light touch  Lab Results: Basic Metabolic Panel:  Recent Labs Lab 06/15/13 0710  06/15/13 2133 06/16/13 0358  NA 144  < > 146* 147*  K 2.8*  < > 3.8 3.7  CL 111  < > 113* 113*  CO2 25  < > 23 26  GLUCOSE 109*  < > 104* 120*  BUN 7  < > 8 7  CREATININE 0.54  < > 0.48* 0.50  CALCIUM 8.0*  < > 8.1* 8.3*  MG 1.4*  --   --  2.1  < > = values in this interval not displayed.  Liver Function Tests:  Recent Labs Lab 06/13/13 1255  AST 21  ALT 16  ALKPHOS 95  BILITOT 0.9  PROT 6.1  ALBUMIN 2.1*   CBC:  Recent Labs Lab 06/13/13 1255  06/15/13 0710 06/16/13 0358  WBC 8.1  < > 11.0* 8.7  NEUTROABS 6.6  --  9.2*  --   HGB 11.3*  < > 10.3* 10.4*  HCT 34.9*  < > 31.0* 32.1*  MCV 89.0  < > 86.4 87.9  PLT 194  < > 200 178  < > = values in this interval not displayed.  Cardiac Enzymes:  Recent Labs Lab 06/13/13 1255 06/13/13 2000 06/14/13 0115  TROPONINI <0.30 <0.30 <0.30   CBG:  Recent Labs Lab 06/15/13 1637 06/15/13 2010 06/15/13 2327 06/16/13 0419 06/16/13 0742 06/16/13 1137  GLUCAP 154* 76 108* 109* 110* 156*   Hemoglobin A1C:  Recent Labs Lab 06/14/13 0115  HGBA1C 5.1   Coagulation:  Recent Labs Lab 06/13/13 1255  LABPROT 19.1*  INR 1.66*   Urine Drug Screen: Drugs of Abuse     Component Value Date/Time   LABOPIA NONE DETECTED 06/13/2013 1332   COCAINSCRNUR NONE DETECTED 06/13/2013 1332   LABBENZ NONE DETECTED 06/13/2013 1332   AMPHETMU NONE DETECTED 06/13/2013 1332   THCU POSITIVE* 06/13/2013 1332   LABBARB NONE DETECTED 06/13/2013 1332    Alcohol Level:  Recent Labs Lab 06/13/13 1255  ETH <11   Urinalysis:  Recent Labs Lab 06/15/13 1804 06/16/13 0346    COLORURINE YELLOW YELLOW  LABSPEC 1.018 1.042*  PHURINE 5.0 6.0  GLUCOSEU NEGATIVE NEGATIVE  HGBUR TRACE* NEGATIVE  BILIRUBINUR NEGATIVE NEGATIVE  KETONESUR NEGATIVE NEGATIVE  PROTEINUR NEGATIVE NEGATIVE  UROBILINOGEN 0.2 0.2  NITRITE NEGATIVE NEGATIVE  LEUKOCYTESUR SMALL* NEGATIVE   Micro Results: Recent Results (from the past 240 hour(s))  URINE CULTURE     Status: None   Collection Time    06/13/13  1:32 PM      Result Value Range Status   Specimen Description URINE, CATHETERIZED   Final   Special Requests NONE   Final   Culture  Setup Time     Final   Value: 06/13/2013 18:06     Performed at [facility] Count     Final   Value: NO GROWTH     Performed at [facility]   Culture     Final   Value: NO GROWTH     Performed at [facility]   Report Status 06/14/2013 FINAL   Final  CULTURE, BLOOD (ROUTINE X 2)     Status: None   Collection Time    06/13/13  2:00 PM      Result Value Range Status   Specimen Description BLOOD LEFT HAND   Final   Special Requests BOTTLES DRAWN AEROBIC ONLY 10CC   Final   Culture  Setup Time     Final   Value: 06/13/2013 19:46     Performed at [facility]   Culture     Final   Value:        BLOOD CULTURE RECEIVED NO GROWTH TO DATE CULTURE WILL BE HELD FOR 5 DAYS BEFORE ISSUING A FINAL NEGATIVE REPORT     Performed at [facility]   Report Status PENDING   Incomplete  CULTURE, BLOOD (ROUTINE X 2)     Status: None   Collection Time    06/13/13  2:30 PM      Result Value Range Status   Specimen Description BLOOD RIGHT HAND   Final   Special Requests BOTTLES DRAWN AEROBIC ONLY 10CC   Final   Culture  Setup Time     Final   Value: 06/13/2013 19:46     Performed at [facility]   Culture     Final   Value:        BLOOD CULTURE RECEIVED NO GROWTH TO DATE CULTURE WILL BE HELD FOR 5 DAYS BEFORE ISSUING A FINAL NEGATIVE REPORT     Performed at [facility]   Report Status  PENDING  Incomplete  MRSA PCR SCREENING     Status: Abnormal   Collection Time    06/13/13  6:43 PM      Result Value Range Status   MRSA by PCR POSITIVE (*) NEGATIVE Final   Comment:            The GeneXpert MRSA Assay (FDA     approved for NASAL specimens     only), is one component of a     comprehensive MRSA colonization     surveillance program. It is not     intended to diagnose MRSA     infection nor to guide or     monitor treatment for     MRSA infections.     RESULT CALLED TO, READ BACK BY AND VERIFIED WITH:     Kennis Carina RN [id number] 06/13/13 A BROWNING  CLOSTRIDIUM DIFFICILE BY PCR     Status: None   Collection Time    06/14/13  9:17 AM      Result Value Range Status   C difficile by pcr NEGATIVE  NEGATIVE Final   Studies/Results: Ct Head Wo Contrast  06/15/2013   CLINICAL DATA:  Slurred speech  EXAM: CT HEAD WITHOUT CONTRAST  TECHNIQUE: Contiguous axial images were obtained from the base of the skull through the vertex without intravenous contrast.  COMPARISON:  Prior CT from 06/13/2013  FINDINGS: Atrophy with chronic small vessel ischemic changes is again noted, stable as compared to the prior examination. Scattered remote lacunar infarcts are also unchanged. No new intracranial hemorrhage or large vessel territory infarct is identified. Ventricles are stable in size without evidence of hydrocephalus. No extra-axial fluid collection. No mass lesion or midline shift. Prominent vascular calcifications again noted.  Calvarium is intact. The orbital soft tissues are within normal limits.  Paranasal sinuses and mastoid air cells are clear.  IMPRESSION: 1. No CT evidence of acute intracranial hemorrhage or infarct identified. 2. Stable appearance of the brain with chronic atrophy, chronic small vessel ischemic changes, and scattered the signed report remote lacunar infarcts.   Electronically Signed   By: Rise Mu M.D.   On: 06/15/2013 23:37   Ct Abdomen Pelvis W  Contrast  06/16/2013   CLINICAL DATA:  Abdominal pain.  EXAM: CT ABDOMEN AND PELVIS WITH CONTRAST  TECHNIQUE: Multidetector CT imaging of the abdomen and pelvis was performed using the standard protocol following bolus administration of intravenous contrast.  CONTRAST:  OMNIPAQUE IOHEXOL 300 MG/ML  SOLN  COMPARISON:  03/29/2013  FINDINGS: BODY WALL: Unremarkable.  LOWER CHEST: Extensive coronary artery atherosclerosis. Moderate sliding-type hiatal hernia. Lower lobe opacities are predominantly linear, with volume loss. Cannot exclude consolidation.  ABDOMEN/PELVIS:  Liver: No focal abnormality.  Biliary: No evidence of biliary obstruction or stone.  Pancreas: Unremarkable.  Spleen: Calcification along the outer spleen from remote insult.  Adrenals: Unremarkable.  Kidneys and ureters: There is gas in the urinary bladder and within the lower right urinary collecting system. This is likely from refluxed gas based on urinalysis from the same day, which does not appear typical for emphysematous pyelitis. No inflammatory changes to the urothelium or kidney. No hydronephrosis. Numerous small bilateral low dense renal lesions, nonspecific due to small size.  Bladder: Gas in the bladder as above.  Mild wall thickening.  Reproductive: Unremarkable.  Bowel: No obstruction. Colonic diverticulosis. Probable appendectomy. Large duodenal diverticulum, not inflamed. There may be low dense gastric rugal thickening, which could represent gastritis or be secondary to under distension.  Retroperitoneum:  No mass or adenopathy.  Peritoneum: No free fluid or gas.  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 Extensive aortic and branch vessel atherosclerosis, with notable calcified plaque at the renal artery ostia.  OSSEOUS: There is a comminuted T12 body fracture, with further osteolysis. The  compression causes vertebra plana. Neighboring endplates show no evidence of new osseous erosion suggest discitis, although the disc spaces are further collapsed. Bony retropulsion has increased, the AP canal diameter now 13 mm as compared to 15 mm previously. There is further kyphosis at this level, with widening of the interspinous space at T11-T12.  Interval removal of posterior rod and screw fixation, previously spanning T9 to L3. Much of the posterior bone graft has been removed or reabsorbed.  IMPRESSION: 1. No acute intra-abdominal abnormality. 2. Gas in the urinary bladder and right renal collecting system is likely related to recent sampling, rather than gas-forming infection. Correlate with urinalysis. 3. Unhealed T12 compression fracture with progressive height loss, kyphosis, and retropulsion since 03/29/2013. Increased T12 osteolysis may be reparative as there is no new neighboring endplate erosions to suggest discitis. If spinal osteomyelitis is a clinical possibility, MRI is recommended. 4. 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 5. Lower lobe atelectasis, with superimposed pneumonia not excluded.   Electronically Signed   By: Tiburcio Pea M.D.   On: 06/16/2013 00:44   Medications: I have reviewed the patient's current medications. Scheduled Meds: . atorvastatin  10 mg Oral q1800  . brimonidine  1 drop Both Eyes BID  . Chlorhexidine Gluconate Cloth  6 each Topical Daily  . dronabinol  5 mg Oral TID  . feeding supplement (ENSURE COMPLETE)  237 mL Oral BID BM  . feeding supplement (PRO-STAT SUGAR FREE 64)  30 mL Oral BID  . ferrous sulfate  325 mg Oral BID WC  . heparin  5,000 Units Subcutaneous Q8H  . insulin aspart  0-9 Units Subcutaneous Q4H  . mupirocin ointment  1 application Nasal BID  . pantoprazole  40 mg Oral Daily  . sertraline  50 mg Oral Daily  . sodium chloride  3 mL Intravenous Q12H  . vancomycin  125 mg Oral Q6H   Continuous Infusions: . dextrose 5 %  and 0.45% NaCl 75 mL/hr at 06/16/13 1049   PRN Meds:.acetaminophen, acetaminophen, albuterol, ipratropium, ondansetron (ZOFRAN) IV, ondansetron, zinc oxide  Assessment/Plan: [name] is a 79 y.o. 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 In the ED, the patient had significant HoTN (SBP into the 60's), and volume depletion. BP returned close to normal after 3 L bolus.   Slurred speech/weakness- Patient is elderly gentleman brought from NH, originally a code stroke. The patient was out of the window, symptoms did not correlate with a stroke, and CT on admission was -ve for acute abnormality. Code Stroke was cancelled. It appears that the patient's symptoms may be d/t significant dehydration and volume depletion as the patient was significantly hypotensive on admission. The patient has such severe dryness of his mucus membranes that his tongue is dry and crusted, making his speech difficult to understand. Patient's mental status has improved somewhat, volume status also improved. Sitting in  chair today, looked more comfortable.  -Repeat CT head w/out contrast showed no acute abnormalities.  -CT abdomen/pelvis w/ contrast snowed no acute intra-abdominal process. -ID consulted to assess for other possibilities of infection; appreciate the consult. D/c'ed Cipro for wound infection as patient has almost completed full course at this time.  -Continue another 14 days of Vanco po for C. Dif as patient still has loose stools and now Cipro has been stopped.  -Discussed case w/ Dr. Shon Baton (orthopedics) yesterday who performed multiple spinal surgeries. Feels that surgical site is not infected at this time. Agrees with current plan.  -Neuro checks q4h  -PT eval recommends SNF  placement -Continue dysphagia 2 diet. -Changed to D5 1/2NS @ 75 ml/hr for now. -Fall precautions   Hypotension- Patient presented with BP in ED of 66/42, most likely 2/2 volume depletion, however sepsis cannot be completely ruled out. However, patient w/out leukocytosis, afebrile. CXR shows slight bibasilar atelectasis, but improved from last admission. UA shows moderate leukocytosis, and few bacteria, however, the patient is currently on Cipro 500 mg for previous lumbar wound infection. Patient also with complicated previous admission w/ C. Dif and patient still with some diarrhea. On oral Vancomycin (liquid) at home. -BP stable at this time. Continue to hold BP meds for now.  -Continue D5 1/2NS @ 75 ml/hr  Hypokalemia- Patient with K of 2.6 yesterday. Improved today to 3.7 after 6 runs of 10 mEq IV + 40 mEq po. On review of NH reports, patient has a history of hypokalemia in the past.  -Continue to monitor -Will also repeat BMET in the AM  Leukocytosis- Resolved. WBC's 8.7 today. Repeat UA showed no signs of UTI. Patient reports no abdominal pain on exam today. ID and Dr. Shon Baton do not feel that spine is infected at this time. -Cipro d/c'ed yesterday. Will continue Vanco po for 14 days (see above for further details). -Continue to monitor CBC -Wound care also consulted, do not feel wound is infected at this time, replaced dressing.   DM type II- Most recent A1c 5.2 in 03/2013.  -Patient with poor po intake. WIll continue with D5 at this time. -On ISS sensitive for now.  -CBG's q4h for now while patient is on D5 1/2NS   HTN- Home medications- Lasix 20 mg bid, Lisinopril 5 mg po qd  -Will continue to hold home meds for now.  CAD- Patient with CABG in 2002 w/ HoTN. Patient denies chest pain, SOB, diaphoresis, nausea, vomiting.  -Troponin x 3 negative  -EKG performed, shows no dynamic changes   HLD- Most recent lipid profile wnl.  -Continue statin   Gout- Stable, not on any medications  at this time.   BPH/Prostate CA- No issues at this time. Had radiation therapy 05/05/13.  -Hold Flomax for now given HoTN   Dispo: Disposition is deferred at this time, awaiting improvement of current medical problems.  Anticipated discharge in approximately 2-3 day(s).  -SW looking for placement. Daughter given a list of places for placement and she will do research as to where she would like [name] [name] to be discharged to. Will work further to discharge patient in timely manner.   The patient does not have a current PCP (Provider Not In System) and does need an [facility] hospital follow-up appointment after discharge.  The patient does have transportation limitations that hinder transportation to clinic appointments.  .Services Needed at time of discharge: Y = Yes, Blank = No PT:   OT:   RN:   Equipment:  Other:     LOS: 3 days   Courtney Paris, MD 06/16/2013, 3:09 PM Pager: [phone]

## 2013-06-16 NOTE — Progress Notes (Signed)
Physical Therapy Treatment Patient Details Name: [name] [name] MRN: [id number] DOB: 27-Jun-1934 Today's Date: 06/16/2013 Time: 7829-5621 PT Time Calculation (min): 26 min  PT Assessment / Plan / Recommendation  History of Present Illness     PT Comments   Pt able to transfer to chair today with SPT and 2+ (A). Pt able to answer questions appropriately and oriented during therapy session. Pt's daughter was present and had questions regarding rehab goals and the progress he had made at SNF prior to hospitalization. Daughter agreeable to SNF at a different facility when medically ready to D/C from acute setting. Will cont to follow per POC.  Follow Up Recommendations  SNF     Does the patient have the potential to tolerate intense rehabilitation     Barriers to Discharge        Equipment Recommendations  Other (comment) (TBD at SNF)    Recommendations for Other Services    Frequency Min 3X/week   Progress towards PT Goals Progress towards PT goals: Progressing toward goals  Plan Current plan remains appropriate    Precautions / Restrictions Precautions Precautions: Fall Restrictions Weight Bearing Restrictions: No   Pertinent Vitals/Pain C/o pain in Rt ear; did not rate pain.     Mobility  Bed Mobility Bed Mobility: Rolling Left;Left Sidelying to Sit;Sitting - Scoot to Edge of Bed Rolling Left: 4: Min guard;With rail Left Sidelying to Sit: 4: Min assist;With rails;HOB elevated Sitting - Scoot to Edge of Bed: 3: Mod assist Details for Bed Mobility Assistance: pt required HOB elevated and cues for hand placement; pt able to come to upright sitting position with min (A); pt c/o dizziniess with bed mobility; unclear if he is dizzy vs has fear of falling; incr time for bed mobility  Transfers Transfers: Sit to Stand;Stand to Sit;Stand Pivot Transfers Sit to Stand: 1: +2 Total assist;From bed;From elevated surface;With upper extremity assist Sit to Stand: Patient Percentage:  40% Stand to Sit: 1: +2 Total assist;To chair/3-in-1;To elevated surface;With upper extremity assist Stand to Sit: Patient Percentage: 40% Stand Pivot Transfers: 1: +2 Total assist;From elevated surface;With armrests Stand Pivot Transfers: Patient Percentage: 30% Details for Transfer Assistance: pt able to power up minimally through LEs; LEs were shaking and unsteady due to weakness; cues for upright standing position and sequencing; pt seemed fearful of falling; required multimodal cues for hand placement and to control descent to chair; use of draw pad achieve upright standing position and SPT to (A) with elevating hips Ambulation/Gait Ambulation/Gait Assistance: Not tested (comment) Stairs: No Wheelchair Mobility Wheelchair Mobility: No    Exercises General Exercises - Lower Extremity Ankle Circles/Pumps: AROM;Both;10 reps Long Arc Quad: AROM;Both;10 reps;Seated   PT Diagnosis:    PT Problem List:   PT Treatment Interventions:     PT Goals (current goals can now be found in the care plan section) Acute Rehab PT Goals Patient Stated Goal: not stated PT Goal Formulation: With patient Time For Goal Achievement: 06/29/13 Potential to Achieve Goals: Fair  Visit Information  Last PT Received On: 06/16/13 Assistance Needed: +2    Subjective Data  Subjective: pt lying supine; daughter present; agreeable to attempt to transfer to chair Patient Stated Goal: not stated   Cognition  Cognition Arousal/Alertness: Awake/alert Behavior During Therapy: WFL for tasks assessed/performed Overall Cognitive Status: Within Functional Limits for tasks assessed    Balance  Balance Balance Assessed: Yes Static Sitting Balance Static Sitting - Balance Support: Bilateral upper extremity supported;Feet supported Static Sitting - Level of Assistance:  5: Stand by assistance;4: Min assist Static Sitting - Comment/# of Minutes: pt tolerated sitting EOB ~5 min; required min (A) when he became  fatigued and c/o dizziniess; pt seemed fearful sitting EOB   End of Session PT - End of Session Equipment Utilized During Treatment: Gait belt Activity Tolerance: Patient tolerated treatment well Patient left: in chair;with call bell/phone within reach;with family/visitor present;Other (comment) (MD present ) Nurse Communication: Mobility status   GP     [name], [place] [phone] 06/16/2013, 3:04 PM

## 2013-06-16 NOTE — Progress Notes (Signed)
  Date: 06/16/2013  Patient name: [name] [name]  Medical record number: [id number]  Date of birth: 1934/02/23   This patient has been seen and the plan of care was discussed with the house staff. Please see their note for complete details. I concur with their findings with the following additions/corrections: Clinically doing better. He is sitting in a chair and daughter is feeding him.  Appreciate ID recs, no evidence of wound infection. Agree with IVF change to D5 1/2 NS. PT/OT. Cont tx for recent  C diff for full 14 days with PO vanc as recommended by ID.  Jonah Blue, DO, FACP Faculty [facility] Res[facility] 06/16/2013, 2:56 PM

## 2013-06-16 NOTE — Progress Notes (Addendum)
[facility]  Date of Admission:  06/13/2013  Antibiotics: Antibiotics Given (last 72 hours)   Date/Time Action Medication Dose Rate   06/14/13 0121 Given   metroNIDAZOLE (FLAGYL) IVPB 250 mg 250 mg 50 mL/hr   06/14/13 0901 Given   metroNIDAZOLE (FLAGYL) IVPB 250 mg 250 mg 50 mL/hr   06/14/13 1019 Given   ciprofloxacin (CIPRO) IVPB 400 mg 400 mg 200 mL/hr   06/14/13 1630 Given   metroNIDAZOLE (FLAGYL) IVPB 250 mg 250 mg 50 mL/hr   06/14/13 2356 Given   metroNIDAZOLE (FLAGYL) IVPB 250 mg 250 mg 50 mL/hr   06/14/13 2356 Given   ciprofloxacin (CIPRO) IVPB 400 mg 400 mg 200 mL/hr   06/15/13 0318 Given   metroNIDAZOLE (FLAGYL) IVPB 250 mg 250 mg 50 mL/hr   06/15/13 1037 Given   ciprofloxacin (CIPRO) tablet 500 mg 500 mg    06/15/13 1039 Given   vancomycin (VANCOCIN) 50 mg/mL oral solution 125 mg 125 mg    06/15/13 1336 Given   vancomycin (VANCOCIN) 50 mg/mL oral solution 125 mg 125 mg    06/15/13 1740 Given   vancomycin (VANCOCIN) 50 mg/mL oral solution 125 mg 125 mg    06/16/13 0009 Given   vancomycin (VANCOCIN) 50 mg/mL oral solution 125 mg 125 mg    06/16/13 0606 Given   vancomycin (VANCOCIN) 50 mg/mL oral solution 125 mg 125 mg    06/16/13 1215 Given   vancomycin (VANCOCIN) 50 mg/mL oral solution 125 mg 125 mg       Subjective: Stool x 4   Objective: Temp:  [97.4 F (36.3 C)-98.1 F (36.7 C)] 97.4 F (36.3 C) (10/30 1355) Pulse Rate:  [81-97] 88 (10/30 1355) Resp:  [18-22] 18 (10/30 1355) BP: (115-164)/(69-85) 115/69 mmHg (10/30 1355) SpO2:  [93 %-98 %] 94 % (10/30 1355) Weight:  [153 lb 15.2 oz (69.831 kg)] 153 lb 15.2 oz (69.831 kg) (10/29 2014)  General: awake, in chair Skin: no rashes Lungs: cta Cor: rrr Abdomen: soft, nt Ext: no edema  Lab Results Lab Results  Component Value Date   WBC 8.7 06/16/2013   HGB 10.4* 06/16/2013   HCT 32.1* 06/16/2013   MCV 87.9 06/16/2013   PLT 178 06/16/2013    Lab Results  Component  Value Date   CREATININE 0.50 06/16/2013   BUN 7 06/16/2013   NA 147* 06/16/2013   K 3.7 06/16/2013   CL 113* 06/16/2013   CO2 26 06/16/2013    Lab Results  Component Value Date   ALT 16 06/13/2013   AST 21 06/13/2013   ALKPHOS 95 06/13/2013   BILITOT 0.9 06/13/2013      Microbiology: Recent Results (from the past 240 hour(s))  URINE CULTURE     Status: None   Collection Time    06/13/13  1:32 PM      Result Value Range Status   Specimen Description URINE, CATHETERIZED   Final   Special Requests NONE   Final   Culture  Setup Time     Final   Value: 06/13/2013 18:06     Performed at [facility] Count     Final   Value: NO GROWTH     Performed at [facility]   Culture     Final   Value: NO GROWTH     Performed at [facility]   Report Status 06/14/2013 FINAL   Final  CULTURE, BLOOD (ROUTINE X 2)  Status: None   Collection Time    06/13/13  2:00 PM      Result Value Range Status   Specimen Description BLOOD LEFT HAND   Final   Special Requests BOTTLES DRAWN AEROBIC ONLY 10CC   Final   Culture  Setup Time     Final   Value: 06/13/2013 19:46     Performed at [facility]   Culture     Final   Value:        BLOOD CULTURE RECEIVED NO GROWTH TO DATE CULTURE WILL BE HELD FOR 5 DAYS BEFORE ISSUING A FINAL NEGATIVE REPORT     Performed at [facility]   Report Status PENDING   Incomplete  CULTURE, BLOOD (ROUTINE X 2)     Status: None   Collection Time    06/13/13  2:30 PM      Result Value Range Status   Specimen Description BLOOD RIGHT HAND   Final   Special Requests BOTTLES DRAWN AEROBIC ONLY 10CC   Final   Culture  Setup Time     Final   Value: 06/13/2013 19:46     Performed at [facility]   Culture     Final   Value:        BLOOD CULTURE RECEIVED NO GROWTH TO DATE CULTURE WILL BE HELD FOR 5 DAYS BEFORE ISSUING A FINAL NEGATIVE REPORT     Performed at [facility]   Report Status PENDING    Incomplete  MRSA PCR SCREENING     Status: Abnormal   Collection Time    06/13/13  6:43 PM      Result Value Range Status   MRSA by PCR POSITIVE (*) NEGATIVE Final   Comment:            The GeneXpert MRSA Assay (FDA     approved for NASAL specimens     only), is one component of a     comprehensive MRSA colonization     surveillance program. It is not     intended to diagnose MRSA     infection nor to guide or     monitor treatment for     MRSA infections.     RESULT CALLED TO, READ BACK BY AND VERIFIED WITH:     Kennis Carina RN 1914 06/13/13 A BROWNING  CLOSTRIDIUM DIFFICILE BY PCR     Status: None   Collection Time    06/14/13  9:17 AM      Result Value Range Status   C difficile by pcr NEGATIVE  NEGATIVE Final    Studies/Results: Ct Head Wo Contrast  06/15/2013   CLINICAL DATA:  Slurred speech  EXAM: CT HEAD WITHOUT CONTRAST  TECHNIQUE: Contiguous axial images were obtained from the base of the skull through the vertex without intravenous contrast.  COMPARISON:  Prior CT from 06/13/2013  FINDINGS: Atrophy with chronic small vessel ischemic changes is again noted, stable as compared to the prior examination. Scattered remote lacunar infarcts are also unchanged. No new intracranial hemorrhage or large vessel territory infarct is identified. Ventricles are stable in size without evidence of hydrocephalus. No extra-axial fluid collection. No mass lesion or midline shift. Prominent vascular calcifications again noted.  Calvarium is intact. The orbital soft tissues are within normal limits.  Paranasal sinuses and mastoid air cells are clear.  IMPRESSION: 1. No CT evidence of acute intracranial hemorrhage or infarct identified. 2. Stable appearance of the brain with chronic atrophy, chronic  small vessel ischemic changes, and scattered the signed report remote lacunar infarcts.   Electronically Signed   By: Rise Mu M.D.   On: 06/15/2013 23:37   Ct Abdomen Pelvis W  Contrast  06/16/2013   CLINICAL DATA:  Abdominal pain.  EXAM: CT ABDOMEN AND PELVIS WITH CONTRAST  TECHNIQUE: Multidetector CT imaging of the abdomen and pelvis was performed using the standard protocol following bolus administration of intravenous contrast.  CONTRAST:  OMNIPAQUE IOHEXOL 300 MG/ML  SOLN  COMPARISON:  03/29/2013  FINDINGS: BODY WALL: Unremarkable.  LOWER CHEST: Extensive coronary artery atherosclerosis. Moderate sliding-type hiatal hernia. Lower lobe opacities are predominantly linear, with volume loss. Cannot exclude consolidation.  ABDOMEN/PELVIS:  Liver: No focal abnormality.  Biliary: No evidence of biliary obstruction or stone.  Pancreas: Unremarkable.  Spleen: Calcification along the outer spleen from remote insult.  Adrenals: Unremarkable.  Kidneys and ureters: There is gas in the urinary bladder and within the lower right urinary collecting system. This is likely from refluxed gas based on urinalysis from the same day, which does not appear typical for emphysematous pyelitis. No inflammatory changes to the urothelium or kidney. No hydronephrosis. Numerous small bilateral low dense renal lesions, nonspecific due to small size.  Bladder: Gas in the bladder as above.  Mild wall thickening.  Reproductive: Unremarkable.  Bowel: No obstruction. Colonic diverticulosis. Probable appendectomy. Large duodenal diverticulum, not inflamed. There may be low dense gastric rugal thickening, which could represent gastritis or be secondary to under distension.  Retroperitoneum: No mass or adenopathy.  Peritoneum: No free fluid or gas.  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 Extensive aortic and branch vessel atherosclerosis, with notable calcified plaque at the renal artery ostia.  OSSEOUS: There is a comminuted T12 body fracture, with further osteolysis. The  compression causes vertebra plana. Neighboring endplates show no evidence of new osseous erosion suggest discitis, although the disc spaces are further collapsed. Bony retropulsion has increased, the AP canal diameter now 13 mm as compared to 15 mm previously. There is further kyphosis at this level, with widening of the interspinous space at T11-T12.  Interval removal of posterior rod and screw fixation, previously spanning T9 to L3. Much of the posterior bone graft has been removed or reabsorbed.  IMPRESSION: 1. No acute intra-abdominal abnormality. 2. Gas in the urinary bladder and right renal collecting system is likely related to recent sampling, rather than gas-forming infection. Correlate with urinalysis. 3. Unhealed T12 compression fracture with progressive height loss, kyphosis, and retropulsion since 03/29/2013. Increased T12 osteolysis may be reparative as there is no new neighboring endplate erosions to suggest discitis. If spinal osteomyelitis is a clinical possibility, MRI is recommended. 4. 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 5. Lower lobe atelectasis, with superimposed pneumonia not excluded.   Electronically Signed   By: Tiburcio Pea M.D.   On: 06/16/2013 00:44    Assessment/Plan: 1)  Wound infection - seen by wound care, ortho, myself, no signs of current infection and cipro stopped yesterday.  Continue to monitor off of antibiotics.  2)  C diff - now negative.  With history though and recent fluoroquinolone use, I would continue treatment to complete 14 days post antibiotics.    I will sign off, please call with questions.   Staci Righter, MD [facility] [email or web address] [phone] pager   [phone]  cell 06/16/2013, 2:12 PM

## 2013-06-16 NOTE — Progress Notes (Signed)
Pt with skin tears to bilateral elbows. Tegaderm to left elbow and pink foam mepilex to right elbow. Bilateral upper extremities with multiple ecchymotic areas. Sacrum with a possible stage 1, but appears blanchable in most areas. Dressing changed to spine incision/wound - aquacel to open area at the bottom of the incision and pink foam mepilex applied to entire incision.  [name] [name], [name] [name]

## 2013-06-17 LAB — BASIC METABOLIC PANEL
BUN: 5 mg/dL — ABNORMAL LOW (ref 6–23)
Calcium: 8.2 mg/dL — ABNORMAL LOW (ref 8.4–10.5)
Chloride: 105 mEq/L (ref 96–112)
Creatinine, Ser: 0.5 mg/dL (ref 0.50–1.35)
GFR calc Af Amer: >90 mL/min (ref >90)
GFR calc Af Amer: >90 mL/min (ref >90)
GFR calc non Af Amer: >90 mL/min (ref >90)
Glucose, Bld: 113 mg/dL — ABNORMAL HIGH (ref 70–99)
Potassium: 3.8 mEq/L (ref 3.5–5.1)
Sodium: 140 mEq/L (ref 135–145)
Sodium: 138 mEq/L (ref 135–145)

## 2013-06-17 LAB — GLUCOSE, CAPILLARY: Glucose-Capillary: 89 mg/dL (ref 70–99)

## 2013-06-17 LAB — CBC
HCT: 34.7 % — ABNORMAL LOW (ref 39.0–52.0)
MCH: 28.1 pg (ref 26.0–34.0)
MCHC: 32.3 g/dL (ref 30.0–36.0)
MCV: 87 fL (ref 78.0–100.0)
RDW: 15.9 % — ABNORMAL HIGH (ref 11.5–15.5)
WBC: 7.3 10*3/uL (ref 4.0–10.5)

## 2013-06-17 LAB — MAGNESIUM: Magnesium: 1.7 mg/dL (ref 1.5–2.5)

## 2013-06-17 MED ORDER — POTASSIUM CHLORIDE 10 MEQ/100ML IV SOLN
10.0000 meq | INTRAVENOUS | Status: AC
  Administered 2013-06-17 (×6): 10 meq via INTRAVENOUS
  Filled 2013-06-17 (×6): qty 100

## 2013-06-17 MED ORDER — MAGNESIUM SULFATE 40 MG/ML IJ SOLN
2.0000 g | Freq: Once | INTRAMUSCULAR | Status: AC
  Administered 2013-06-17: 2 g via INTRAVENOUS
  Filled 2013-06-17: qty 50

## 2013-06-17 MED ORDER — POTASSIUM CHLORIDE CRYS ER 20 MEQ PO TBCR
40.0000 meq | EXTENDED_RELEASE_TABLET | Freq: Once | ORAL | Status: AC
  Administered 2013-06-17: 40 meq via ORAL
  Filled 2013-06-17: qty 2

## 2013-06-17 MED ORDER — LISINOPRIL 5 MG PO TABS
5.0000 mg | ORAL_TABLET | Freq: Every day | ORAL | Status: DC
  Administered 2013-06-17 – 2013-06-21 (×5): 5 mg via ORAL
  Filled 2013-06-17 (×5): qty 1

## 2013-06-17 MED ORDER — INSULIN ASPART 100 UNIT/ML ~~LOC~~ SOLN
0.0000 [IU] | Freq: Three times a day (TID) | SUBCUTANEOUS | Status: DC
  Administered 2013-06-18 – 2013-06-19 (×2): 1 [IU] via SUBCUTANEOUS
  Administered 2013-06-20: 2 [IU] via SUBCUTANEOUS
  Administered 2013-06-20 – 2013-06-21 (×2): 1 [IU] via SUBCUTANEOUS

## 2013-06-17 NOTE — Progress Notes (Signed)
Late Entry  At approx 1040-11am pt's HR sustaining 130-140's. Pt was slouched down in the bed and had had a large BM. Pt's HR remained elevated until after he was cleaned up and then repositioned in the bed. Afterwards his HR was 70-80's. Dr. Yetta Barre notified. Pt currently receiving 6 runs IV potassium for K 2.5. No new orders received. Will continue to monitor.    Kathlene November, Adrionna Delcid [place]

## 2013-06-17 NOTE — Progress Notes (Signed)
Subjective: [name] is a 79 y.o. 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 In the ED, the patient had significant HoTN (SBP into the 60's), and volume depletion. BP returned close to normal after 3 L bolus.   Seen at bedside this AM. Still similar in appearance to yesterday. Ate breakfast this AM with no issues. Denies any pain this morning, no SOB, dizziness, lightheadedness, fever, chills, nausea, or vomiting. Still did not seem to sleep very well despite an increase in Marinol yesterday.   Objective: Vital signs in last 24 hours: Filed Vitals:   06/16/13 1355 06/16/13 1716 06/16/13 2014 06/17/13 0442  BP: 115/69 149/80 160/85 150/71  Pulse: 88 93 98 88  Temp: 97.4 F (36.3 C) 97.8 F (36.6 C) 97.9 F (36.6 C) 97.5 F (36.4 C)  TempSrc: Oral Oral Oral Oral  Resp: 18 19 18 18   Height:      Weight:   153 lb 15.2 oz (69.831 kg)   SpO2: 94%  95% 96%   Weight change: 0 lb (0 kg)  Intake/Output Summary (Last 24 hours) at 06/17/13 0711 Last data filed at 06/17/13 1610  Gross per 24 hour  Intake   1010 ml  Output      0 ml  Net   1010 ml   Physical Exam: General: Alert, cooperative, and no acute distress.   HEENT: Vision grossly intact, mouth appearing improved, still with beefy looking tongue.  Neck: Full range of motion without pain, supple, no lymphadenopathy or carotid bruits Lungs: Clear to ascultation bilaterally, normal work of respiration, no wheezes, rales, ronchi Heart: Regular rate and rhythm, no murmurs, gallops, or rubs Abdomen: Soft, mildly tender to palpation, non-distended, normal bowel sounds Extremities: No cyanosis, clubbing, or edema Back: With healing surgical wounds, does not appear infected. Granulation tissue present, no pus or  bleeding present, no foul odor. Re-dressed by wound care yesterday. Neurologic: Alert & oriented x3, cranial nerves II-XII intact, strength grossly intact, sensation intact to light touch  Lab Results: Basic Metabolic Panel:  Recent Labs Lab 06/15/13 0710  06/15/13 2133 06/16/13 0358  NA 144  < > 146* 147*  K 2.8*  < > 3.8 3.7  CL 111  < > 113* 113*  CO2 25  < > 23 26  GLUCOSE 109*  < > 104* 120*  BUN 7  < > 8 7  CREATININE 0.54  < > 0.48* 0.50  CALCIUM 8.0*  < > 8.1* 8.3*  MG 1.4*  --   --  2.1  < > = values in this interval not displayed.  Liver Function Tests:  Recent Labs Lab 06/13/13 1255  AST 21  ALT 16  ALKPHOS 95  BILITOT 0.9  PROT 6.1  ALBUMIN 2.1*   CBC:  Recent Labs Lab 06/13/13 1255  06/15/13 0710 06/16/13 0358 06/17/13 0625  WBC 8.1  < > 11.0* 8.7 7.3  NEUTROABS 6.6  --  9.2*  --   --   HGB 11.3*  < > 10.3* 10.4* 11.2*  HCT 34.9*  < > 31.0* 32.1* 34.7*  MCV 89.0  < > 86.4 87.9 87.0  PLT 194  < > 200 178 172  < > = values in this interval not displayed.  Cardiac Enzymes:  Recent  Labs Lab 06/13/13 1255 06/13/13 2000 06/14/13 0115  TROPONINI <0.30 <0.30 <0.30   CBG:  Recent Labs Lab 06/16/13 0419 06/16/13 0742 06/16/13 1137 06/16/13 1655 06/16/13 2012 06/16/13 2330  GLUCAP 109* 110* 156* 103* 123* 107*   Hemoglobin A1C:  Recent Labs Lab 06/14/13 0115  HGBA1C 5.1   Coagulation:  Recent Labs Lab 06/13/13 1255  LABPROT 19.1*  INR 1.66*   Urine Drug Screen: Drugs of Abuse     Component Value Date/Time   LABOPIA NONE DETECTED 06/13/2013 1332   COCAINSCRNUR NONE DETECTED 06/13/2013 1332   LABBENZ NONE DETECTED 06/13/2013 1332   AMPHETMU NONE DETECTED 06/13/2013 1332   THCU POSITIVE* 06/13/2013 1332   LABBARB NONE DETECTED 06/13/2013 1332    Alcohol Level:  Recent Labs Lab 06/13/13 1255  ETH <11   Urinalysis:  Recent Labs Lab 06/15/13 1804 06/16/13 0346  COLORURINE YELLOW YELLOW  LABSPEC 1.018 1.042*    PHURINE 5.0 6.0  GLUCOSEU NEGATIVE NEGATIVE  HGBUR TRACE* NEGATIVE  BILIRUBINUR NEGATIVE NEGATIVE  KETONESUR NEGATIVE NEGATIVE  PROTEINUR NEGATIVE NEGATIVE  UROBILINOGEN 0.2 0.2  NITRITE NEGATIVE NEGATIVE  LEUKOCYTESUR SMALL* NEGATIVE   Micro Results: Recent Results (from the past 240 hour(s))  URINE CULTURE     Status: None   Collection Time    06/13/13  1:32 PM      Result Value Range Status   Specimen Description URINE, CATHETERIZED   Final   Special Requests NONE   Final   Culture  Setup Time     Final   Value: 06/13/2013 18:06     Performed at [facility] Count     Final   Value: NO GROWTH     Performed at [facility]   Culture     Final   Value: NO GROWTH     Performed at [facility]   Report Status 06/14/2013 FINAL   Final  CULTURE, BLOOD (ROUTINE X 2)     Status: None   Collection Time    06/13/13  2:00 PM      Result Value Range Status   Specimen Description BLOOD LEFT HAND   Final   Special Requests BOTTLES DRAWN AEROBIC ONLY 10CC   Final   Culture  Setup Time     Final   Value: 06/13/2013 19:46     Performed at [facility]   Culture     Final   Value:        BLOOD CULTURE RECEIVED NO GROWTH TO DATE CULTURE WILL BE HELD FOR 5 DAYS BEFORE ISSUING A FINAL NEGATIVE REPORT     Performed at [facility]   Report Status PENDING   Incomplete  CULTURE, BLOOD (ROUTINE X 2)     Status: None   Collection Time    06/13/13  2:30 PM      Result Value Range Status   Specimen Description BLOOD RIGHT HAND   Final   Special Requests BOTTLES DRAWN AEROBIC ONLY 10CC   Final   Culture  Setup Time     Final   Value: 06/13/2013 19:46     Performed at [facility]   Culture     Final   Value:        BLOOD CULTURE RECEIVED NO GROWTH TO DATE CULTURE WILL BE HELD FOR 5 DAYS BEFORE ISSUING A FINAL NEGATIVE REPORT     Performed at [facility]   Report Status PENDING   Incomplete  MRSA PCR SCREENING     Status:  Abnormal   Collection Time    06/13/13  6:43 PM      Result Value Range Status   MRSA by PCR POSITIVE (*) NEGATIVE Final   Comment:            The GeneXpert MRSA Assay (FDA     approved for NASAL specimens     only), is one component of a     comprehensive MRSA colonization     surveillance program. It is not     intended to diagnose MRSA     infection nor to guide or     monitor treatment for     MRSA infections.     RESULT CALLED TO, READ BACK BY AND VERIFIED WITH:     Kennis Carina RN [id number] 06/13/13 A BROWNING  CLOSTRIDIUM DIFFICILE BY PCR     Status: None   Collection Time    06/14/13  9:17 AM      Result Value Range Status   C difficile by pcr NEGATIVE  NEGATIVE Final   Studies/Results: Ct Head Wo Contrast  06/15/2013   CLINICAL DATA:  Slurred speech  EXAM: CT HEAD WITHOUT CONTRAST  TECHNIQUE: Contiguous axial images were obtained from the base of the skull through the vertex without intravenous contrast.  COMPARISON:  Prior CT from 06/13/2013  FINDINGS: Atrophy with chronic small vessel ischemic changes is again noted, stable as compared to the prior examination. Scattered remote lacunar infarcts are also unchanged. No new intracranial hemorrhage or large vessel territory infarct is identified. Ventricles are stable in size without evidence of hydrocephalus. No extra-axial fluid collection. No mass lesion or midline shift. Prominent vascular calcifications again noted.  Calvarium is intact. The orbital soft tissues are within normal limits.  Paranasal sinuses and mastoid air cells are clear.  IMPRESSION: 1. No CT evidence of acute intracranial hemorrhage or infarct identified. 2. Stable appearance of the brain with chronic atrophy, chronic small vessel ischemic changes, and scattered the signed report remote lacunar infarcts.   Electronically Signed   By: Rise Mu M.D.   On: 06/15/2013 23:37   Ct Abdomen Pelvis W Contrast  06/16/2013   CLINICAL DATA:  Abdominal pain.   EXAM: CT ABDOMEN AND PELVIS WITH CONTRAST  TECHNIQUE: Multidetector CT imaging of the abdomen and pelvis was performed using the standard protocol following bolus administration of intravenous contrast.  CONTRAST:  OMNIPAQUE IOHEXOL 300 MG/ML  SOLN  COMPARISON:  03/29/2013  FINDINGS: BODY WALL: Unremarkable.  LOWER CHEST: Extensive coronary artery atherosclerosis. Moderate sliding-type hiatal hernia. Lower lobe opacities are predominantly linear, with volume loss. Cannot exclude consolidation.  ABDOMEN/PELVIS:  Liver: No focal abnormality.  Biliary: No evidence of biliary obstruction or stone.  Pancreas: Unremarkable.  Spleen: Calcification along the outer spleen from remote insult.  Adrenals: Unremarkable.  Kidneys and ureters: There is gas in the urinary bladder and within the lower right urinary collecting system. This is likely from refluxed gas based on urinalysis from the same day, which does not appear typical for emphysematous pyelitis. No inflammatory changes to the urothelium or kidney. No hydronephrosis. Numerous small bilateral low dense renal lesions, nonspecific due to small size.  Bladder: Gas in the bladder as above.  Mild wall thickening.  Reproductive: Unremarkable.  Bowel: No obstruction. Colonic diverticulosis. Probable appendectomy. Large duodenal diverticulum, not inflamed. There may be low dense gastric rugal thickening, which could represent gastritis or be secondary to under distension.  Retroperitoneum: No  mass or adenopathy.  Peritoneum: No free fluid or gas.  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 Extensive aortic and branch vessel atherosclerosis, with notable calcified plaque at the renal artery ostia.  OSSEOUS: There is a comminuted T12 body fracture, with further osteolysis. The compression causes vertebra plana. Neighboring endplates show no  evidence of new osseous erosion suggest discitis, although the disc spaces are further collapsed. Bony retropulsion has increased, the AP canal diameter now 13 mm as compared to 15 mm previously. There is further kyphosis at this level, with widening of the interspinous space at T11-T12.  Interval removal of posterior rod and screw fixation, previously spanning T9 to L3. Much of the posterior bone graft has been removed or reabsorbed.  IMPRESSION: 1. No acute intra-abdominal abnormality. 2. Gas in the urinary bladder and right renal collecting system is likely related to recent sampling, rather than gas-forming infection. Correlate with urinalysis. 3. Unhealed T12 compression fracture with progressive height loss, kyphosis, and retropulsion since 03/29/2013. Increased T12 osteolysis may be reparative as there is no new neighboring endplate erosions to suggest discitis. If spinal osteomyelitis is a clinical possibility, MRI is recommended. 4. 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 5. Lower lobe atelectasis, with superimposed pneumonia not excluded.   Electronically Signed   By: Tiburcio Pea M.D.   On: 06/16/2013 00:44   Medications: I have reviewed the patient's current medications. Scheduled Meds: . atorvastatin  10 mg Oral q1800  . brimonidine  1 drop Both Eyes BID  . Chlorhexidine Gluconate Cloth  6 each Topical Daily  . dronabinol  5 mg Oral TID  . feeding supplement (ENSURE COMPLETE)  237 mL Oral BID BM  . feeding supplement (PRO-STAT SUGAR FREE 64)  30 mL Oral BID  . ferrous sulfate  325 mg Oral BID WC  . heparin  5,000 Units Subcutaneous Q8H  . insulin aspart  0-9 Units Subcutaneous Q4H  . lisinopril  5 mg Oral Daily  . mupirocin ointment  1 application Nasal BID  . pantoprazole  40 mg Oral Daily  . sertraline  50 mg Oral Daily  . sodium chloride  3 mL Intravenous Q12H  . vancomycin  125 mg Oral Q6H   Continuous Infusions: . dextrose 5 % and 0.45% NaCl 75 mL/hr at  06/17/13 0141   PRN Meds:.acetaminophen, acetaminophen, albuterol, ipratropium, ondansetron (ZOFRAN) IV, ondansetron, zinc oxide  Assessment/Plan: [name] is a 79 y.o. 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 In the ED, the patient had significant HoTN (SBP into the 60's), and volume depletion. BP returned close to normal after 3 L bolus.   Slurred speech/weakness- Patient is elderly gentleman brought from NH, originally a code stroke. The patient was out of the window, symptoms did not correlate with a stroke, and CT on admission was -ve for acute abnormality. Code Stroke was cancelled. It appears that the patient's symptoms may be d/t significant dehydration and volume depletion as the patient was significantly hypotensive on admission. The patient has such severe dryness of his mucus membranes that his tongue is dry and crusted, making his speech difficult to understand. Patient's mental status has improved  somewhat, volume status also improved.  -Repeat CT head w/out contrast showed no acute abnormalities.  -CT abdomen/pelvis w/ contrast snowed no acute intra-abdominal process. -ID consulted to assess for other possibilities of infection; appreciate the consult. D/c'ed Cipro for wound infection as patient has almost completed full course at this time.  -Continue another 12 days of Vanco po for C. Dif as patient still has loose stools and now Cipro has been stopped.  -Discussed case w/ Dr. Shon Baton (orthopedics) yesterday who performed multiple spinal surgeries. Feels that surgical site is not infected at this time. Agrees with current plan.  -Neuro checks q4h  -PT eval recommends SNF placement -Continue dysphagia 2 diet. -Changed to D5 1/2NS @ 75 ml/hr for now. -Fall  precautions   Hypotension- Resolved. BP hight this AM to 160/85. -Restarted Lisinopril 5 mg po qd -Continue D5 1/2NS @ 75 ml/hr  Hypokalemia- K 2.5 today. Mag 1.7 -Gave 40 mEq K this AM + 6x 10 mEq IV runs throughout the day -Gave MgSO4 IV 2g -Continue to monitor; repeat BMET in PM -Will assess for reasons for hypokalemia. Patient does take Lasix at home, but has not been taking since admission. -Will also repeat BMET in the AM  Leukocytosis- Resolved. WBC's 7.3 today. -Will continue Vanco po for 12 days (see above for further details). -Continue to monitor CBC.  DM type II- Most recent A1c 5.2 in 03/2013.  -Patient with poor po intake. WIll continue with D5 at this time. -On ISS sensitive for now.  -CBG's changed to AC/HS.  HTN- Home medications- Lasix 20 mg bid, Lisinopril 5 mg po qd  -Restarted Lisinopril 5 mg po qd  CAD- Patient with CABG in 2002 w/ HoTN. Patient denies chest pain, SOB, diaphoresis, nausea, vomiting.  -Troponin x 3 negative  -EKG performed, shows no dynamic changes   HLD- Most recent lipid profile wnl.  -Continue statin   Gout- Stable, not on any medications at this time.   BPH/Prostate CA- No issues at this time. Had radiation therapy 05/05/13.  -Hold Flomax for now given HoTN   Dispo: Disposition is deferred at this time, awaiting improvement of current medical problems.  Anticipated discharge in approximately 2-3 day(s).  -SW looking for placement. Daughter given a list of places for placement and she will do research as to where she would like [name] to be discharged to. Will work further to discharge patient in timely manner.   The patient does not have a current PCP (Provider Not In System) and does need an [facility] hospital follow-up appointment after discharge.  The patient does have transportation limitations that hinder transportation to clinic appointments.  .Services Needed at time of discharge: Y = Yes, Blank = No PT:   OT:   RN:     Equipment:   Other:     LOS: 4 days   Courtney Paris, MD 06/17/2013, 7:11 AM Pager: [phone]

## 2013-06-17 NOTE — Progress Notes (Signed)
CRITICAL VALUE ALERT  Critical value received:  K 2.5  Date of notification:  06/17/13  Time of notification:  0745  Critical value read back:yes  Nurse who received alert:  Hortense Ramal, RN  MD notified:  Dr. Yetta Barre  Time of first page:  MD at bedside   [facility], [place]
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
## 2013-06-17 NOTE — Progress Notes (Signed)
  Date: 06/17/2013  Patient name: [name] [name]  Medical record number: [id number]  Date of birth: 1934-08-13   This patient has been seen and the plan of care was discussed with the house staff. Please see their note for complete details. I concur with their findings with the following additions/corrections: Replace Mg, replace K. Recheck this afternoon and tomorrow morning. If corrects and then declines, obtain TTKG.  Continue total 14 days of Vanc PO for C diff.  Hemodynamically stable.   PT/OT need to work extensively with him.  Needs to increase PO intake.  He has severe protein calorie malnutrition.  Jonah Blue, DO, FACP Faculty [facility] 06/17/2013, 1:41 PM

## 2013-06-18 LAB — CBC
MCH: 28.1 pg (ref 26.0–34.0)
MCHC: 33.3 g/dL (ref 30.0–36.0)
MCV: 84.3 fL (ref 78.0–100.0)
Platelets: 180 10*3/uL (ref 150–400)
RDW: 16.1 % — ABNORMAL HIGH (ref 11.5–15.5)

## 2013-06-18 LAB — BASIC METABOLIC PANEL
BUN: 5 mg/dL — ABNORMAL LOW (ref 6–23)
CO2: 28 mEq/L (ref 19–32)
Calcium: 8.2 mg/dL — ABNORMAL LOW (ref 8.4–10.5)
Chloride: 106 mEq/L (ref 96–112)
Creatinine, Ser: 0.49 mg/dL — ABNORMAL LOW (ref 0.50–1.35)
Creatinine, Ser: 0.52 mg/dL (ref 0.50–1.35)
GFR calc Af Amer: >90 mL/min (ref >90)
GFR calc non Af Amer: >90 mL/min (ref >90)
Glucose, Bld: 99 mg/dL (ref 70–99)
Glucose, Bld: 86 mg/dL (ref 70–99)
Sodium: 139 mEq/L (ref 135–145)

## 2013-06-18 LAB — GLUCOSE, CAPILLARY: Glucose-Capillary: 107 mg/dL — ABNORMAL HIGH (ref 70–99)

## 2013-06-18 MED ORDER — POTASSIUM CHLORIDE CRYS ER 20 MEQ PO TBCR
40.0000 meq | EXTENDED_RELEASE_TABLET | Freq: Once | ORAL | Status: AC
  Administered 2013-06-18: 40 meq via ORAL
  Filled 2013-06-18: qty 2

## 2013-06-18 NOTE — Progress Notes (Signed)
Subjective: [name] is a 79 y.o. 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 In the ED, the patient had significant HoTN (SBP into the 60's), and volume depletion. BP returned close to normal after 3 L bolus.   Seen at bedside this AM. No overnight issues. Patient relatively unchanged from yesterday. Does not appear as volume depleted as before. Still mildly confused at times, difficult to understand 2/2 garbled/muffled speech. Denies any complaints. No nausea, vomiting, abdominal pain, SOB, chest pain, dysuria, fever, or chills.   Yesterday patient had  K of 2.5. Received 40 mEq po K-dur + 6x 10 mEq K IV. Resulted in K increase to 3.8 in the evening. This AM, K is 3.4.  Objective: Vital signs in last 24 hours: Filed Vitals:   06/17/13 1238 06/17/13 1459 06/17/13 2103 06/18/13 0448  BP:  153/79 147/86 150/81  Pulse: 95 72 80 80  Temp:  98.6 F (37 C) 98.1 F (36.7 C) 98.8 F (37.1 C)  TempSrc:  Oral Oral Oral  Resp:  18 20 18   Height:      Weight:   156 lb 4.9 oz (70.9 kg)   SpO2: 95% 93% 90% 91%   Weight change: 2 lb 5.7 oz (1.069 kg)  Intake/Output Summary (Last 24 hours) at 06/18/13 1033 Last data filed at 06/17/13 1842  Gross per 24 hour  Intake   1420 ml  Output      0 ml  Net   1420 ml   Physical Exam: General: Alert, cooperative, and no acute distress. A&O x3 today, but mentioned he had to go to an "appointment" this AM. HEENT: Vision grossly intact, mouth improved, still with beefy looking tongue.  Neck: Full range of motion without pain, supple, no lymphadenopathy or carotid bruits Lungs: Clear to ascultation bilaterally, normal work of respiration, no wheezes, rales, ronchi Heart: Regular rate and rhythm, no murmurs, gallops, or rubs Abdomen:  Soft, non-tender, non-distended, normal bowel sounds Extremities: No cyanosis, clubbing, or edema Back: With healing surgical wounds, does not appear infected. Granulation tissue present, no pus or bleeding present, no foul odor. Re-dressed by wound care yesterday. Neurologic: Alert & oriented x3, but with some confusion at times. Cranial nerves II-XII intact, strength grossly intact, sensation intact to light touch  Lab Results: Basic Metabolic Panel:  Recent Labs Lab 06/17/13 0625 06/17/13 1800 06/18/13 0550  NA 140 138 138  K 2.5* 3.8 3.4*  CL 105 106 103  CO2 28 25 25   GLUCOSE 99 113* 99  BUN 5* 6 5*  CREATININE 0.50 0.43* 0.49*  CALCIUM 8.0* 8.2* 8.0*  MG 1.7  --  2.0   Liver Function Tests:  Recent Labs Lab 06/13/13 1255  AST 21  ALT 16  ALKPHOS 95  BILITOT 0.9  PROT 6.1  ALBUMIN 2.1*   CBC:  Recent Labs Lab 06/13/13 1255  06/15/13 0710  06/17/13 0625 06/18/13 0550  WBC 8.1  < > 11.0*  < > 7.3 7.2  NEUTROABS 6.6  --  9.2*  --   --   --   HGB 11.3*  < > 10.3*  < > 11.2* 9.7*  HCT 34.9*  < > 31.0*  < > 34.7* 29.1*  MCV 89.0  < > 86.4  < >  87.0 84.3  PLT 194  < > 200  < > 172 180  < > = values in this interval not displayed.  Cardiac Enzymes:  Recent Labs Lab 06/13/13 1255 06/13/13 2000 06/14/13 0115  TROPONINI <0.30 <0.30 <0.30   CBG:  Recent Labs Lab 06/17/13 0417 06/17/13 0756 06/17/13 1226 06/17/13 1644 06/17/13 2106 06/18/13 0805  GLUCAP 101* 100* 120* 150* 89 114*   Hemoglobin A1C:  Recent Labs Lab 06/14/13 0115  HGBA1C 5.1   Coagulation:  Recent Labs Lab 06/13/13 1255  LABPROT 19.1*  INR 1.66*   Urine Drug Screen: Drugs of Abuse     Component Value Date/Time   LABOPIA NONE DETECTED 06/13/2013 1332   COCAINSCRNUR NONE DETECTED 06/13/2013 1332   LABBENZ NONE DETECTED 06/13/2013 1332   AMPHETMU NONE DETECTED 06/13/2013 1332   THCU POSITIVE* 06/13/2013 1332   LABBARB NONE DETECTED 06/13/2013 1332    Alcohol  Level:  Recent Labs Lab 06/13/13 1255  ETH <11   Urinalysis:  Recent Labs Lab 06/15/13 1804 06/16/13 0346  COLORURINE YELLOW YELLOW  LABSPEC 1.018 1.042*  PHURINE 5.0 6.0  GLUCOSEU NEGATIVE NEGATIVE  HGBUR TRACE* NEGATIVE  BILIRUBINUR NEGATIVE NEGATIVE  KETONESUR NEGATIVE NEGATIVE  PROTEINUR NEGATIVE NEGATIVE  UROBILINOGEN 0.2 0.2  NITRITE NEGATIVE NEGATIVE  LEUKOCYTESUR SMALL* NEGATIVE   Micro Results: Recent Results (from the past 240 hour(s))  URINE CULTURE     Status: None   Collection Time    06/13/13  1:32 PM      Result Value Range Status   Specimen Description URINE, CATHETERIZED   Final   Special Requests NONE   Final   Culture  Setup Time     Final   Value: 06/13/2013 18:06     Performed at [facility] Count     Final   Value: NO GROWTH     Performed at [facility]   Culture     Final   Value: NO GROWTH     Performed at [facility]   Report Status 06/14/2013 FINAL   Final  CULTURE, BLOOD (ROUTINE X 2)     Status: None   Collection Time    06/13/13  2:00 PM      Result Value Range Status   Specimen Description BLOOD LEFT HAND   Final   Special Requests BOTTLES DRAWN AEROBIC ONLY 10CC   Final   Culture  Setup Time     Final   Value: 06/13/2013 19:46     Performed at [facility]   Culture     Final   Value:        BLOOD CULTURE RECEIVED NO GROWTH TO DATE CULTURE WILL BE HELD FOR 5 DAYS BEFORE ISSUING A FINAL NEGATIVE REPORT     Performed at [facility]   Report Status PENDING   Incomplete  CULTURE, BLOOD (ROUTINE X 2)     Status: None   Collection Time    06/13/13  2:30 PM      Result Value Range Status   Specimen Description BLOOD RIGHT HAND   Final   Special Requests BOTTLES DRAWN AEROBIC ONLY 10CC   Final   Culture  Setup Time     Final   Value: 06/13/2013 19:46     Performed at [facility]   Culture     Final   Value:        BLOOD CULTURE RECEIVED NO GROWTH TO DATE CULTURE WILL  BE HELD FOR 5 DAYS BEFORE ISSUING A FINAL NEGATIVE REPORT     Performed at [facility]   Report Status PENDING   Incomplete  MRSA PCR SCREENING     Status: Abnormal   Collection Time    06/13/13  6:43 PM      Result Value Range Status   MRSA by PCR POSITIVE (*) NEGATIVE Final   Comment:            The GeneXpert MRSA Assay (FDA     approved for NASAL specimens     only), is one component of a     comprehensive MRSA colonization     surveillance program. It is not     intended to diagnose MRSA     infection nor to guide or     monitor treatment for     MRSA infections.     RESULT CALLED TO, READ BACK BY AND VERIFIED WITH:     Kennis Carina RN [id number] 06/13/13 A BROWNING  CLOSTRIDIUM DIFFICILE BY PCR     Status: None   Collection Time    06/14/13  9:17 AM      Result Value Range Status   C difficile by pcr NEGATIVE  NEGATIVE Final   Studies/Results: No results found. Medications: I have reviewed the patient's current medications. Scheduled Meds: . atorvastatin  10 mg Oral q1800  . brimonidine  1 drop Both Eyes BID  . Chlorhexidine Gluconate Cloth  6 each Topical Daily  . dronabinol  5 mg Oral TID  . feeding supplement (ENSURE COMPLETE)  237 mL Oral BID BM  . feeding supplement (PRO-STAT SUGAR FREE 64)  30 mL Oral BID  . ferrous sulfate  325 mg Oral BID WC  . heparin  5,000 Units Subcutaneous Q8H  . insulin aspart  0-9 Units Subcutaneous TID WC  . lisinopril  5 mg Oral Daily  . mupirocin ointment  1 application Nasal BID  . pantoprazole  40 mg Oral Daily  . sertraline  50 mg Oral Daily  . sodium chloride  3 mL Intravenous Q12H  . vancomycin  125 mg Oral Q6H   Continuous Infusions: . dextrose 5 % and 0.45% NaCl 75 mL/hr at 06/18/13 0608   PRN Meds:.acetaminophen, acetaminophen, albuterol, ipratropium, ondansetron (ZOFRAN) IV, ondansetron, zinc oxide  Assessment/Plan: [name] is a 79 y.o. 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 In the ED, the patient had significant HoTN (SBP into the 60's), and volume depletion.   Slurred speech/weakness- Patient is elderly gentleman brought from NH, originally a code stroke. The patient was out of the window, symptoms did not correlate with a stroke, and CT on admission was -ve for acute abnormality. Code Stroke was cancelled. It appears that the patient's symptoms may be d/t significant dehydration and volume depletion as the patient was significantly hypotensive on admission. The patient has such severe dryness of his mucus membranes that his tongue is dry and crusted, making his speech difficult to understand. Patient's mental status has improved somewhat, volume status much improved.  -Continue another 11 days of Vanco po for C. Dif as patient still has loose stools and now Cipro has been stopped.  -Neuro checks q4h  -PT eval recommends SNF placement. Will get out  of bed to chair today. -Continue dysphagia 2 diet. -Continue D5 1/2NS @ 75 ml/hr. -Fall precautions   Hypotension- Resolved. BP 150/81 this AM. -Continue Lisinopril 5 mg po qd -Continue D5 1/2NS @ 75 ml/hr  Hypokalemia- K 3.4 today. Mag 2.0 -K 2.5 yesterday. Gave 40 mEq K  + 6x 10 mEq IV runs throughout the day. Evening K was 3.8. -Gave MgSO4 IV 2g yesterday as well.  -Continue to monitor; repeat BMET in PM -Will assess for reasons for hypokalemia. Most likely 2/2 poor po intake + GI losses 2/2 continued loose stools. Will reassess for further causes of continued hypoK. -Will also repeat BMET in the AM  Leukocytosis- Resolved. WBC's 7.2 today. -Will continue Vanco po for 11 more days (see above for further details). -Continue to monitor CBC.  DM type II- Most recent A1c 5.2 in 03/2013.  -Patient with poor po intake, but improving  according to the nursing staff. Continue with D5 at this time. -On ISS sensitive for now.  -CBG's  AC/HS.  HTN- Home medications- Lasix 20 mg bid, Lisinopril 5 mg po qd  -Restarted Lisinopril 5 mg po qd -Continue to hold Lasix given the patient's chronic hypokalemia.  CAD- Patient with CABG in 2002. Stable  HLD- Most recent lipid profile wnl.  -Continue statin   Gout- Stable, not on any medications at this time.   BPH/Prostate CA- No issues at this time. Had radiation therapy 05/05/13.  -Hold Flomax for now given HoTN   Dispo: Disposition is deferred at this time, awaiting improvement of current medical problems.  Anticipated discharge in approximately 2-3 day(s).  -SW looking for placement. Daughter given a list of places for placement and she will do research as to where she would like [name] [name] to be discharged to. Will work further to discharge patient in timely manner.   The patient does not have a current PCP (Provider Not In System) and does need an [facility] hospital follow-up appointment after discharge.  The patient does have transportation limitations that hinder transportation to clinic appointments.  .Services Needed at time of discharge: Y = Yes, Blank = No PT:   OT:   RN:   Equipment:   Other:     LOS: 5 days   Courtney Paris, MD 06/18/2013, 10:33 AM Pager: [phone]

## 2013-06-19 DIAGNOSIS — R410 Disorientation, unspecified: Secondary | ICD-10-CM | POA: Diagnosis not present

## 2013-06-19 DIAGNOSIS — Z22322 Carrier or suspected carrier of Methicillin resistant Staphylococcus aureus: Secondary | ICD-10-CM

## 2013-06-19 DIAGNOSIS — Z8619 Personal history of other infectious and parasitic diseases: Secondary | ICD-10-CM | POA: Diagnosis present

## 2013-06-19 DIAGNOSIS — E46 Unspecified protein-calorie malnutrition: Secondary | ICD-10-CM | POA: Diagnosis present

## 2013-06-19 LAB — CULTURE, BLOOD (ROUTINE X 2)
Culture: NO GROWTH
Culture: NO GROWTH

## 2013-06-19 LAB — GLUCOSE, CAPILLARY
Glucose-Capillary: 95 mg/dL (ref 70–99)
Glucose-Capillary: 141 mg/dL — ABNORMAL HIGH (ref 70–99)
Glucose-Capillary: 95 mg/dL (ref 70–99)

## 2013-06-19 LAB — BASIC METABOLIC PANEL
CO2: 25 mEq/L (ref 19–32)
Calcium: 8.2 mg/dL — ABNORMAL LOW (ref 8.4–10.5)
Chloride: 107 mEq/L (ref 96–112)
Creatinine, Ser: 0.44 mg/dL — ABNORMAL LOW (ref 0.50–1.35)
GFR calc Af Amer: >90 mL/min (ref >90)
Glucose, Bld: 92 mg/dL (ref 70–99)
Sodium: 139 mEq/L (ref 135–145)

## 2013-06-19 LAB — MAGNESIUM: Magnesium: 1.8 mg/dL (ref 1.5–2.5)

## 2013-06-19 MED ORDER — CHLORHEXIDINE GLUCONATE 0.12 % MT SOLN
15.0000 mL | Freq: Two times a day (BID) | OROMUCOSAL | Status: DC
  Administered 2013-06-19 – 2013-06-21 (×4): 15 mL via OROMUCOSAL
  Filled 2013-06-19 (×6): qty 15

## 2013-06-19 MED ORDER — DRONABINOL 2.5 MG PO CAPS
2.5000 mg | ORAL_CAPSULE | Freq: Two times a day (BID) | ORAL | Status: DC
  Administered 2013-06-19 – 2013-06-21 (×4): 2.5 mg via ORAL
  Filled 2013-06-19 (×4): qty 1

## 2013-06-19 MED ORDER — BIOTENE DRY MOUTH MT LIQD
15.0000 mL | Freq: Two times a day (BID) | OROMUCOSAL | Status: DC
  Administered 2013-06-19 – 2013-06-21 (×4): 15 mL via OROMUCOSAL

## 2013-06-19 NOTE — Progress Notes (Addendum)
Subjective: Pt is asleep.    Objective: Vital signs in last 24 hours: Filed Vitals:   06/18/13 1826 06/18/13 2245 06/19/13 0624 06/19/13 0834  BP: 145/64 137/82 166/88 106/77  Pulse: 80 85 81 77  Temp: 97.8 F (36.6 C) 97.9 F (36.6 C) 98.3 F (36.8 C) 98.5 F (36.9 C)  TempSrc: Oral Oral Oral Oral  Resp: 18 20 24 20   Height:      Weight:  156 lb 12 oz (71.1 kg)    SpO2: 95% 96% 96% 98%   Weight change: 7.1 oz (0.2 kg)  Intake/Output Summary (Last 24 hours) at 06/19/13 1130 Last data filed at 06/19/13 0845  Gross per 24 hour  Intake   2280 ml  Output   1400 ml  Net    880 ml   Vitals reviewed. General: resting in bed asleep, NAD, cachetic  HEENT: [place]/at, no scleral icterus. Tongue appear dry, dentures in mouth Cardiac: RRR, no rubs, murmurs or gallops Pulm: clear to auscultation b/l lateral lung fields, no wheezes, rales, or rhonchi Abd: soft, nontender, nondistended, BS present Ext: warm and well perfused, no pedal edema Neuro: asleep, nonverbal, w/d to pain all 4 extremities    Lab Results: Basic Metabolic Panel:  Recent Labs Lab 06/18/13 0550 06/18/13 2006 06/19/13 0725  NA 138 139 139  K 3.4* 3.4* 3.6  CL 103 106 107  CO2 25 28 25   GLUCOSE 99 86 92  BUN 5* 5* 4*  CREATININE 0.49* 0.52 0.44*  CALCIUM 8.0* 8.2* 8.2*  MG 2.0  --  1.8   Liver Function Tests:  Recent Labs Lab 06/13/13 1255  AST 21  ALT 16  ALKPHOS 95  BILITOT 0.9  PROT 6.1  ALBUMIN 2.1*   CBC:  Recent Labs Lab 06/13/13 1255  06/15/13 0710  06/17/13 0625 06/18/13 0550  WBC 8.1  < > 11.0*  < > 7.3 7.2  NEUTROABS 6.6  --  9.2*  --   --   --   HGB 11.3*  < > 10.3*  < > 11.2* 9.7*  HCT 34.9*  < > 31.0*  < > 34.7* 29.1*  MCV 89.0  < > 86.4  < > 87.0 84.3  PLT 194  < > 200  < > 172 180  < > = values in this interval not displayed.  Cardiac Enzymes:  Recent Labs Lab 06/13/13 1255 06/13/13 2000 06/14/13 0115  TROPONINI <0.30 <0.30 <0.30   CBG:  Recent Labs Lab  06/17/13 2106 06/18/13 0805 06/18/13 1253 06/18/13 2248 06/19/13 0818 06/19/13 1100  GLUCAP 89 114* 122* 107* 95 141*   Hemoglobin A1C:  Recent Labs Lab 06/14/13 0115  HGBA1C 5.1   Coagulation:  Recent Labs Lab 06/13/13 1255  LABPROT 19.1*  INR 1.66*   Urine Drug Screen: Drugs of Abuse     Component Value Date/Time   LABOPIA NONE DETECTED 06/13/2013 1332   COCAINSCRNUR NONE DETECTED 06/13/2013 1332   LABBENZ NONE DETECTED 06/13/2013 1332   AMPHETMU NONE DETECTED 06/13/2013 1332   THCU POSITIVE* 06/13/2013 1332   LABBARB NONE DETECTED 06/13/2013 1332    Alcohol Level:  Recent Labs Lab 06/13/13 1255  ETH <11   Urinalysis:  Recent Labs Lab 06/15/13 1804 06/16/13 0346  COLORURINE YELLOW YELLOW  LABSPEC 1.018 1.042*  PHURINE 5.0 6.0  GLUCOSEU NEGATIVE NEGATIVE  HGBUR TRACE* NEGATIVE  BILIRUBINUR NEGATIVE NEGATIVE  KETONESUR NEGATIVE NEGATIVE  PROTEINUR NEGATIVE NEGATIVE  UROBILINOGEN 0.2 0.2  NITRITE NEGATIVE NEGATIVE  LEUKOCYTESUR SMALL*  NEGATIVE   Micro Results: Recent Results (from the past 240 hour(s))  URINE CULTURE     Status: None   Collection Time    06/13/13  1:32 PM      Result Value Range Status   Specimen Description URINE, CATHETERIZED   Final   Special Requests NONE   Final   Culture  Setup Time     Final   Value: 06/13/2013 18:06     Performed at [facility] Count     Final   Value: NO GROWTH     Performed at [facility]   Culture     Final   Value: NO GROWTH     Performed at [facility]   Report Status 06/14/2013 FINAL   Final  CULTURE, BLOOD (ROUTINE X 2)     Status: None   Collection Time    06/13/13  2:00 PM      Result Value Range Status   Specimen Description BLOOD LEFT HAND   Final   Special Requests BOTTLES DRAWN AEROBIC ONLY 10CC   Final   Culture  Setup Time     Final   Value: 06/13/2013 19:46     Performed at [facility]   Culture     Final   Value: NO GROWTH 5  DAYS     Performed at [facility]   Report Status 06/19/2013 FINAL   Final  CULTURE, BLOOD (ROUTINE X 2)     Status: None   Collection Time    06/13/13  2:30 PM      Result Value Range Status   Specimen Description BLOOD RIGHT HAND   Final   Special Requests BOTTLES DRAWN AEROBIC ONLY 10CC   Final   Culture  Setup Time     Final   Value: 06/13/2013 19:46     Performed at [facility]   Culture     Final   Value: NO GROWTH 5 DAYS     Performed at [facility]   Report Status 06/19/2013 FINAL   Final  MRSA PCR SCREENING     Status: Abnormal   Collection Time    06/13/13  6:43 PM      Result Value Range Status   MRSA by PCR POSITIVE (*) NEGATIVE Final   Comment:            The GeneXpert MRSA Assay (FDA     approved for NASAL specimens     only), is one component of a     comprehensive MRSA colonization     surveillance program. It is not     intended to diagnose MRSA     infection nor to guide or     monitor treatment for     MRSA infections.     RESULT CALLED TO, READ BACK BY AND VERIFIED WITH:     Kennis Carina RN [id number] 06/13/13 A BROWNING  CLOSTRIDIUM DIFFICILE BY PCR     Status: None   Collection Time    06/14/13  9:17 AM      Result Value Range Status   C difficile by pcr NEGATIVE  NEGATIVE Final   Studies/Results: No results found. Medications: I have reviewed the patient's current medications. Scheduled Meds: . atorvastatin  10 mg Oral q1800  . brimonidine  1 drop Both Eyes BID  . Chlorhexidine Gluconate Cloth  6 each Topical Daily  . dronabinol  5 mg Oral TID  .  feeding supplement (ENSURE COMPLETE)  237 mL Oral BID BM  . feeding supplement (PRO-STAT SUGAR FREE 64)  30 mL Oral BID  . ferrous sulfate  325 mg Oral BID WC  . heparin  5,000 Units Subcutaneous Q8H  . insulin aspart  0-9 Units Subcutaneous TID WC  . lisinopril  5 mg Oral Daily  . mupirocin ointment  1 application Nasal BID  . pantoprazole  40 mg Oral Daily  . sertraline  50 mg  Oral Daily  . sodium chloride  3 mL Intravenous Q12H  . vancomycin  125 mg Oral Q6H   Continuous Infusions: . dextrose 5 % and 0.45% NaCl 75 mL/hr at 06/19/13 0911   PRN Meds:.acetaminophen, acetaminophen, albuterol, ipratropium, ondansetron (ZOFRAN) IV, ondansetron, zinc oxide  Assessment/Plan: [name] [name] is a 79 y.o. 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 In the ED, the patient had significant HoTN (SBP into the 60's), and volume depletion.   #Concern for delirium  -Pt's mental status is waxing and waning daily  -will ask that RN monitor currently he is so sleepy not able to answer questions  -Decreased Marinol to 2.5 mg bid from 5 mg tid which I deem was making him somnolent  -Neuro checks q4h  -Blood and urine cultures negative for infection  #Slurred speech/physical deconditioning/malnutrition -Tongue was really dry and has been this admission though somewhat improved which could be contributing to slurred speech.  He is significantly malnourished and dehydrated on admission.   -Pt seems physically deconditioned and will need PT/OT to still work with him this admission and outpatient.   #DM type II -Most recent A1c 5.1 in 06/14/2013.  -On SSI sensitive -monitor cbgs  #H/o Cdiff/MRSA -protocol and contact  -tx'ing for C diff for 12 days Vanc oral this is Day 5/14   #HTN -Resolved hypotn. Home medications- Lasix 20 mg bid, Lisinopril 5 mg po qd  -Restarted Lisinopril 5 mg po qd -Continue to hold Lasix given the patient's h/o hypokalemia.  #History of right brachial and basilic vein thrombosis and DVT -noted 03/2013 per prior d/c summary he has IVC filter and no anticoagulation due to active hemorhage and acute anemia requiring transfusion at that time    #F/E/N -D5 1/2 NS 75 cc/hr  -Hypokalemia resolved. BMET, Mag, Phos in the am. If pt is hypoK in am will check TTKG but he did have 2-3 diarrhea episodes 06/18/13  -dys 2 diet plus supplements    #DVT px  -Heparin  Dispo: Disposition is deferred at this time, awaiting improvement of current medical problems.  Anticipated discharge in approximately 2-3 day(s). SW looking for placement. Daughter given a list of places for placement and she will do research as to where she would like Mr. Hunger to be discharged to. Will work further to discharge patient in timely manner.   The patient does not have a current PCP (Provider Not In System) and does need an [facility] hospital follow-up appointment after discharge.  The patient does have transportation limitations that hinder transportation to clinic appointments.  .Services Needed at time of discharge: Y = Yes, Blank = No PT:   OT:   RN:   Equipment:   Other: SNF    LOS: 6 days   Annett Gula, MD 06/19/2013,  11:30 AM Pager: [phone]

## 2013-06-20 DIAGNOSIS — E46 Unspecified protein-calorie malnutrition: Secondary | ICD-10-CM

## 2013-06-20 DIAGNOSIS — Z8619 Personal history of other infectious and parasitic diseases: Secondary | ICD-10-CM

## 2013-06-20 LAB — GLUCOSE, CAPILLARY
Glucose-Capillary: 134 mg/dL — ABNORMAL HIGH (ref 70–99)
Glucose-Capillary: 108 mg/dL — ABNORMAL HIGH (ref 70–99)
Glucose-Capillary: 140 mg/dL — ABNORMAL HIGH (ref 70–99)
Glucose-Capillary: 162 mg/dL — ABNORMAL HIGH (ref 70–99)
Glucose-Capillary: 107 mg/dL — ABNORMAL HIGH (ref 70–99)

## 2013-06-20 LAB — BASIC METABOLIC PANEL
BUN: 4 mg/dL — ABNORMAL LOW (ref 6–23)
BUN: 5 mg/dL — ABNORMAL LOW (ref 6–23)
CO2: 26 mEq/L (ref 19–32)
CO2: 28 mEq/L (ref 19–32)
Calcium: 8.1 mg/dL — ABNORMAL LOW (ref 8.4–10.5)
Calcium: 8.5 mg/dL (ref 8.4–10.5)
Chloride: 104 mEq/L (ref 96–112)
Creatinine, Ser: 0.44 mg/dL — ABNORMAL LOW (ref 0.50–1.35)
GFR calc non Af Amer: >90 mL/min (ref >90)
GFR calc non Af Amer: >90 mL/min (ref >90)
Glucose, Bld: 104 mg/dL — ABNORMAL HIGH (ref 70–99)
Glucose, Bld: 91 mg/dL (ref 70–99)
Potassium: 3.8 mEq/L (ref 3.5–5.1)
Sodium: 140 mEq/L (ref 135–145)
Sodium: 139 mEq/L (ref 135–145)

## 2013-06-20 LAB — OSMOLALITY: Osmolality: 285 mOsm/kg (ref 275–300)

## 2013-06-20 LAB — NA AND K (SODIUM & POTASSIUM), RAND UR: Sodium, Ur: 97 mEq/L

## 2013-06-20 MED ORDER — POTASSIUM CHLORIDE CRYS ER 20 MEQ PO TBCR
40.0000 meq | EXTENDED_RELEASE_TABLET | Freq: Once | ORAL | Status: AC
  Administered 2013-06-20: 40 meq via ORAL
  Filled 2013-06-20: qty 2

## 2013-06-20 MED ORDER — NICOTINE 7 MG/24HR TD PT24: 7.0000 mg | MEDICATED_PATCH | Freq: Every day | TRANSDERMAL | Status: DC
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
## 2013-06-20 NOTE — Progress Notes (Signed)
Subjective: Mr. Ruffus Kamaka is a 79 y.o. 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 In the ED, the patient had significant HoTN (SBP into the 60's), and volume depletion. BP returned close to normal after 3 L bolus.   Seen at bedside this AM. Appears much better today. Does not look as dry today, appears more calm and content this AM. Denies any new complaints. Says he feels good today.   Objective: Vital signs in last 24 hours: Filed Vitals:   06/19/13 1806 06/19/13 2219 06/20/13 0610 06/20/13 0825  BP: 153/85 140/79 153/74 135/80  Pulse: 76 71 76 67  Temp: 98.4 F (36.9 C) 98.7 F (37.1 C) 98.5 F (36.9 C) 98.4 F (36.9 C)  TempSrc: Oral Oral Oral Oral  Resp: 18 18 17 18   Height:  5\' 10"  (1.778 m)    Weight:  155 lb 6.8 oz (70.5 kg)    SpO2: 98% 98% 91% 95%   Weight change: -1 lb 5.2 oz (-0.6 kg)  Intake/Output Summary (Last 24 hours) at 06/20/13 1021 Last data filed at 06/20/13 0800  Gross per 24 hour  Intake   2520 ml  Output    726 ml  Net   1794 ml   Physical Exam: General: Alert, cooperative, and no acute distress. A&O x3 HEENT: Vision grossly intact, mouth improved, still with beefy looking tongue.  Neck: Full range of motion without pain, supple, no lymphadenopathy or carotid bruits Lungs: Clear to ascultation bilaterally, normal work of respiration, no wheezes, rales, ronchi Heart: Regular rate and rhythm, no murmurs, gallops, or rubs Abdomen: Soft, non-tender, non-distended, normal bowel sounds Extremities: No cyanosis, clubbing, or edema Back: With healing surgical wounds, does not appear infected. Granulation tissue present, no pus or bleeding present, no foul odor. Re-dressed by wound care yesterday. Neurologic: Alert & oriented x3, Cranial  nerves II-XII intact, strength grossly intact, sensation intact to light touch  Lab Results: Basic Metabolic Panel:  Recent Labs Lab 06/19/13 0725 06/20/13 0536  NA 139 140  K 3.6 3.0*  CL 107 108  CO2 25 26  GLUCOSE 92 104*  BUN 4* 4*  CREATININE 0.44* 0.44*  CALCIUM 8.2* 8.1*  MG 1.8 1.8  PHOS  --  3.1   Liver Function Tests:  Recent Labs Lab 06/13/13 1255  AST 21  ALT 16  ALKPHOS 95  BILITOT 0.9  PROT 6.1  ALBUMIN 2.1*   CBC:  Recent Labs Lab 06/13/13 1255  06/15/13 0710  06/17/13 0625 06/18/13 0550  WBC 8.1  < > 11.0*  < > 7.3 7.2  NEUTROABS 6.6  --  9.2*  --   --   --   HGB 11.3*  < > 10.3*  < > 11.2* 9.7*  HCT 34.9*  < > 31.0*  < > 34.7* 29.1*  MCV 89.0  < > 86.4  < > 87.0 84.3  PLT 194  < > 200  < > 172 180  < > = values in this interval not displayed.  Cardiac Enzymes:  Recent Labs Lab 06/13/13 1255 06/13/13 2000 06/14/13 0115  TROPONINI <0.30 <0.30 <0.30   CBG:  Recent Labs Lab 06/18/13 2248 06/19/13 0818 06/19/13 1100 06/19/13 1653 06/19/13 2221 06/20/13 0825  GLUCAP 107* 95 141* 103* 95 108*  Hemoglobin A1C:  Recent Labs Lab 06/14/13 0115  HGBA1C 5.1   Coagulation:  Recent Labs Lab 06/13/13 1255  LABPROT 19.1*  INR 1.66*   Urine Drug Screen: Drugs of Abuse     Component Value Date/Time   LABOPIA NONE DETECTED 06/13/2013 1332   COCAINSCRNUR NONE DETECTED 06/13/2013 1332   LABBENZ NONE DETECTED 06/13/2013 1332   AMPHETMU NONE DETECTED 06/13/2013 1332   THCU POSITIVE* 06/13/2013 1332   LABBARB NONE DETECTED 06/13/2013 1332    Alcohol Level:  Recent Labs Lab 06/13/13 1255  ETH <11   Urinalysis:  Recent Labs Lab 06/15/13 1804 06/16/13 0346  COLORURINE YELLOW YELLOW  LABSPEC 1.018 1.042*  PHURINE 5.0 6.0  GLUCOSEU NEGATIVE NEGATIVE  HGBUR TRACE* NEGATIVE  BILIRUBINUR NEGATIVE NEGATIVE  KETONESUR NEGATIVE NEGATIVE  PROTEINUR NEGATIVE NEGATIVE  UROBILINOGEN 0.2 0.2  NITRITE NEGATIVE NEGATIVE    LEUKOCYTESUR SMALL* NEGATIVE   Micro Results: Recent Results (from the past 240 hour(s))  URINE CULTURE     Status: None   Collection Time    06/13/13  1:32 PM      Result Value Range Status   Specimen Description URINE, CATHETERIZED   Final   Special Requests NONE   Final   Culture  Setup Time     Final   Value: 06/13/2013 18:06     Performed at [facility] Count     Final   Value: NO GROWTH     Performed at [facility]   Culture     Final   Value: NO GROWTH     Performed at [facility]   Report Status 06/14/2013 FINAL   Final  CULTURE, BLOOD (ROUTINE X 2)     Status: None   Collection Time    06/13/13  2:00 PM      Result Value Range Status   Specimen Description BLOOD LEFT HAND   Final   Special Requests BOTTLES DRAWN AEROBIC ONLY 10CC   Final   Culture  Setup Time     Final   Value: 06/13/2013 19:46     Performed at [facility]   Culture     Final   Value: NO GROWTH 5 DAYS     Performed at [facility]   Report Status 06/19/2013 FINAL   Final  CULTURE, BLOOD (ROUTINE X 2)     Status: None   Collection Time    06/13/13  2:30 PM      Result Value Range Status   Specimen Description BLOOD RIGHT HAND   Final   Special Requests BOTTLES DRAWN AEROBIC ONLY 10CC   Final   Culture  Setup Time     Final   Value: 06/13/2013 19:46     Performed at [facility]   Culture     Final   Value: NO GROWTH 5 DAYS     Performed at [facility]   Report Status 06/19/2013 FINAL   Final  MRSA PCR SCREENING     Status: Abnormal   Collection Time    06/13/13  6:43 PM      Result Value Range Status   MRSA by PCR POSITIVE (*) NEGATIVE Final   Comment:            The GeneXpert MRSA Assay (FDA     approved for NASAL specimens     only), is one component of a     comprehensive MRSA colonization  surveillance program. It is not     intended to diagnose MRSA     infection nor to guide or     monitor treatment for      MRSA infections.     RESULT CALLED TO, READ BACK BY AND VERIFIED WITH:     Kennis Carina RN 1610 06/13/13 A BROWNING  CLOSTRIDIUM DIFFICILE BY PCR     Status: None   Collection Time    06/14/13  9:17 AM      Result Value Range Status   C difficile by pcr NEGATIVE  NEGATIVE Final   Medications: I have reviewed the patient's current medications. Scheduled Meds: . antiseptic oral rinse  15 mL Mouth Rinse q12n4p  . atorvastatin  10 mg Oral q1800  . brimonidine  1 drop Both Eyes BID  . chlorhexidine  15 mL Mouth Rinse BID  . Chlorhexidine Gluconate Cloth  6 each Topical Daily  . dronabinol  2.5 mg Oral BID AC  . feeding supplement (ENSURE COMPLETE)  237 mL Oral BID BM  . feeding supplement (PRO-STAT SUGAR FREE 64)  30 mL Oral BID  . ferrous sulfate  325 mg Oral BID WC  . heparin  5,000 Units Subcutaneous Q8H  . insulin aspart  0-9 Units Subcutaneous TID WC  . lisinopril  5 mg Oral Daily  . mupirocin ointment  1 application Nasal BID  . pantoprazole  40 mg Oral Daily  . sertraline  50 mg Oral Daily  . sodium chloride  3 mL Intravenous Q12H  . vancomycin  125 mg Oral Q6H   Continuous Infusions: . dextrose 5 % and 0.45% NaCl 75 mL/hr at 06/20/13 0700   PRN Meds:.acetaminophen, acetaminophen, albuterol, ipratropium, ondansetron (ZOFRAN) IV, ondansetron, zinc oxide  Assessment/Plan: Mr. Jonaven Hilgers is a 79 y.o. 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 In the ED, the patient had significant HoTN (SBP into the 60's), and volume depletion.   Slurred speech/weakness- Patient is elderly gentleman brought from NH, originally a code stroke. The patient was out of the window, symptoms did not correlate with a stroke, and CT on admission was -ve for acute abnormality. Code Stroke  was cancelled. It appears that the patient's symptoms may be d/t significant dehydration and volume depletion as the patient was significantly hypotensive on admission.  Patient's mental status has improved, volume status also improved.  -Continue another 9 days of Vanco po for C. Dif as patient still has loose stools and now Cipro has been stopped.  -Neuro checks q4h  -PT eval recommends SNF placement. Will get out of bed to chair today. -Continue dysphagia 2 diet. -Continue D5 1/2NS @ 75 ml/hr. -Fall precautions   Hypotension- Resolved. BP 125/77 this AM. -Continue Lisinopril 5 mg po qd -Continue D5 1/2NS @ 75 ml/hr  Hypokalemia- K 3.0 today. Mag 1.8. TTKG calculated as 7.97. In a patient with appropriate reduction of K excretion in hypokalemia, expected TTKG is K<3. Therefore, K is being inappropraitely wasted in this case.  -Will give K-dur 40 mEq bid and recheck BMET in PM -Continue to monitor; repeat BMET in AM as well -Will assess for reasons for hypokalemia. Most likely 2/2 poor po intake + GI losses 2/2 continued loose stools. Will reassess for further causes of continued hypoK. -  Will also repeat BMET in the AM  Leukocytosis- Resolved. No further signs of infection at this time. -Will continue Vanco po for 9 more days (see above for further details).  DM type II- Most recent A1c 5.2 in 03/2013.  -Patient with poor po intake, but improving according to the nursing staff. Continue with D5 at this time. -On ISS sensitive for now.  -CBG's  AC/HS.  HTN- Home medications- Lasix 20 mg bid, Lisinopril 5 mg po qd  -Restarted Lisinopril 5 mg po qd -Continue to hold Lasix given the patient's chronic hypokalemia.  CAD- Patient with CABG in 2002. Stable  HLD- Most recent lipid profile wnl.  -Continue statin   Gout- Stable, not on any medications at this time.   BPH/Prostate CA- No issues at this time. Had radiation therapy 05/05/13.  -Hold Flomax for now given HoTN   Dispo:  Disposition is deferred at this time, awaiting improvement of current medical problems.  Anticipated discharge in approximately 2-3 day(s).  -SW looking for placement. Daughter given a list of places for placement and she will do research as to where she would like Mr. Prokop to be discharged to. Will work further to discharge patient in timely manner.   The patient does not have a current PCP (Provider Not In System) and does need an [facility] hospital follow-up appointment after discharge.  The patient does have transportation limitations that hinder transportation to clinic appointments.  .Services Needed at time of discharge: Y = Yes, Blank = No PT:   OT:   RN:   Equipment:   Other:     LOS: 7 days   Courtney Paris, MD 06/20/2013, 10:21 AM Pager: [phone]

## 2013-06-20 NOTE — Progress Notes (Signed)
  Date: 06/20/2013  Patient name: [name] [name]  Medical record number: [id number]  Date of birth: Nov 04, 1933   This patient has been seen and the plan of care was discussed with the house staff. Please see their note for complete details. I concur with their findings with the following additions/corrections: Patient is slightly more alert today. Afebrile. Replace K. Check TTKG. PT/OT.  Jonah Blue, DO, FACP [facility] Internal Medicine Residency [facility] 06/20/2013, 2:12 PM

## 2013-06-20 NOTE — Progress Notes (Signed)
06/13/2013  Rm# [id number] [employer] CODE STATUS:  Full Code   History of present illness: Physical therapist reported to me this a.m., at approximately 1045 to 11 AM, but the patient was too weak and unable to participate in physical therapy. She asked for consultation with the patient regarding to increased lethargy, and confusion.  The patient was noted to have the 50% of his breakfast this morning. Per the nursing report, there was a sharp and sudden decline between 8:30 AM and 11 AM.  Past medical history: was diagnosed in the C. difficile. Patient was treated with Flagyl and subsequently Vancocin which resolved the diarrhea.  Mr. Oaxaca has been given multiple liters of D5 half-normal saline via clysis and IV, related to fluid deficits from the C. Difficile.  Medications: Acetaminophen 650 mg every 6 hours as needed Vitamin C 1000 mg twice a day Pepto-Bismol 524 mg every 6 hours as needed for indigestion/diarrhea Lipitor 10 mg Alphagan opth sol, 0.2% Lipitor 10 mg Calcium carbonate, 600/200 per day. Vitamin D3 1000 units per day Famotidine 20 mg per day Ferrous sulfate 325 mg twice a day ProStat 30 mL as twice a day Cipro 500 mg twice a day Glucerna 237 mL as per day DuoNeb 3 mL is every 6 hours when necessary wheezing Lisinopril 5 mg Multivitamin with mineral one per day Potassium, 20 mEq per day Zoloft 50 mg Flomax 0.4 mg at bedtime Tramadol 50 mg every 8 hours as needed.  Lab results from 06/13/2013 AM Sodium 145 Potassium 3.3 Chloride 105 CO2 32 AGAP 8 Glucose 109 BUN 8 Creatinine 0.5 BUN/CR 15.8 Calcium 8.6  Complete blood count from 06/06/2013 WBC 8.7 RBC 4.1 Hemoglobin 11.8 Hematocrit 38.2 MCV 93.6 MCH 28.9 MCHC 30.9 RDW 17.6 Platelets 193 Differential was completely unremarkable.  Vital signs: Blood pressure 73/41 Pulse oximetry 95% Pulse approximately 90 Respiratory rate 20. Capillary blood glucose 119  The patient is awake, speech is  extremely garbled and difficult to understand. I did tell the patient that he was going to the hospital and he did repeat hospital but otherwise his speech was extremely difficult to understand. Movement of all 4 extremities, and certainly a positive response to pain as noted by doing a fingerstick blood glucose testing Extraocular movements intact, and pupils appear to be equally round reactive to light. Apical pulse is a regular rate and rhythm. Bilateral breath sounds are entirely completely clear. Abdomen appears nontender.  Based on the evidence of a sharp and sudden decline, decision made to call 911 and have patient transported to hospital.    The emergency service personnel arrived in less than 15 minutes, and IV line was established, the patient transported to [facility]      This encounter was created in error - please disregard.

## 2013-06-21 ENCOUNTER — Inpatient Hospital Stay: Payer: Medicare Other | Admitting: Internal Medicine

## 2013-06-21 ENCOUNTER — Inpatient Hospital Stay: Payer: Medicare Other | Admitting: Infectious Diseases

## 2013-06-21 DIAGNOSIS — I10 Essential (primary) hypertension: Secondary | ICD-10-CM

## 2013-06-21 LAB — BASIC METABOLIC PANEL
BUN: 5 mg/dL — ABNORMAL LOW (ref 6–23)
Chloride: 106 mEq/L (ref 96–112)
Creatinine, Ser: 0.54 mg/dL (ref 0.50–1.35)
GFR calc Af Amer: >90 mL/min (ref >90)
Sodium: 138 mEq/L (ref 135–145)

## 2013-06-21 LAB — CBC
HCT: 30.6 % — ABNORMAL LOW (ref 39.0–52.0)
MCHC: 33.7 g/dL (ref 30.0–36.0)
MCV: 85.7 fL (ref 78.0–100.0)
Platelets: 169 10*3/uL (ref 150–400)
RDW: 16.3 % — ABNORMAL HIGH (ref 11.5–15.5)

## 2013-06-21 MED ORDER — FIRST-VANCOMYCIN 50 50 MG/ML PO SOLN: 125.0000 mg | Freq: Four times a day (QID) | ORAL | Status: AC

## 2013-06-21 MED ORDER — FIRST-VANCOMYCIN 50 50 MG/ML PO SOLN: 125.0000 mg | Freq: Four times a day (QID) | ORAL | Status: DC

## 2013-06-21 NOTE — Discharge Summary (Signed)
Name: [name] [name] MRN: [id number] DOB: October 27, 1933 79 y.o. PCP: Provider Not In System  Date of Admission: 06/13/2013 12:09 PM Date of Discharge: 06/21/2013 Attending Physician: Jonah Blue, DO  Discharge Diagnosis: 1. Weakness/Hypotension- Originally code stroke, but cancelled in ED. Most likely 2/2 severe volume depletion. Significantly improved after fluids.  2. C. Dif. Infection- Recently -ve, but +ve at previous admission. Treated w/ Vanco po. Had been on Cipro for lumbar spinal infection which was discontinued on this admission. Continued for total of 14 more days after Cipro was stopped, given presence of loose stools. NEEDS TO CONTINUE VANCO FOR 7 MORE DAYS 3. Hypokalemia- Consistent issue, frequently repleted. Most likeley 2/2 diarrheal losses + decreased po intake. WIll supplement on discharge. 4. Leukocytosis-Resolved 5. Lumbar spinal surgery/infection 6. HTN 7. CAD 8. DM type II  Discharge Medications:   Medication List    STOP taking these medications       ciprofloxacin 500 MG tablet  Commonly known as:  CIPRO     furosemide 20 MG tablet  Commonly known as:  LASIX      TAKE these medications       acetaminophen 325 MG tablet  Commonly known as:  TYLENOL  Take 650 mg by mouth every 6 (six) hours as needed for pain.     atorvastatin 10 MG tablet  Commonly known as:  LIPITOR  Take 10 mg by mouth daily.     BALMEX 11.3 % Crea cream  Generic drug:  zinc oxide  Apply 1 application topically daily as needed (to scrotum).     bismuth subsalicylate 262 MG chewable tablet  Commonly known as:  PEPTO BISMOL  Chew 524 mg by mouth every 6 (six) hours as needed for indigestion.     bismuth subsalicylate 262 MG/15ML suspension  Commonly known as:  PEPTO BISMOL  Take 30 mLs by mouth every 6 (six) hours as needed for indigestion.     brimonidine 0.2 % ophthalmic solution  Commonly known as:  ALPHAGAN  Place 1 drop into both eyes 2 (two) times daily.     CALCIUM 600+D 600-200 MG-UNIT Tabs  Generic drug:  Calcium Carbonate-Vitamin D  Take 1 tablet by mouth daily.     cholecalciferol 1000 UNITS tablet  Commonly known as:  VITAMIN D  Take 1,000 Units by mouth daily.     dextrose 40 % Gel  Commonly known as:  GLUTOSE  Take 1 Tube by mouth once as needed (for hypotension).     dronabinol 2.5 MG capsule  Commonly known as:  MARINOL  Take 2.5 mg by mouth 3 (three) times daily.     famotidine 20 MG tablet  Commonly known as:  PEPCID  Take 20 mg by mouth daily.     feeding supplement (PRO-STAT SUGAR FREE 64) Liqd  Take 30 mLs by mouth 2 (two) times daily.     ferrous sulfate 325 (65 FE) MG tablet  Take 325 mg by mouth 2 (two) times daily with a meal.     FIRST-VANCOMYCIN 50 50 MG/ML Soln  Take 125 mg by mouth every 6 (six) hours. For 14 days since discontinuation of Cipro (06/15/13). Continue for 7 more days.     GLUCAGEN 1 MG Solr injection  Generic drug:  glucagon  Inject 1 mg into the vein once as needed.     GLUCERNA Liqd  Take 237 mLs by mouth daily. Vanilla     ipratropium-albuterol 0.5-2.5 (3) MG/3ML Soln  Commonly known as:  DUONEB  Take 3 mLs by nebulization every 6 (six) hours as needed (for shortness of breath).     lisinopril 5 MG tablet  Commonly known as:  PRINIVIL,ZESTRIL  Take 5 mg by mouth daily.     LISTERINE AGENT COOLBLUE Liqd  Use as directed 15 mLs in the mouth or throat 2 (two) times daily.     multivitamin with minerals tablet  Take 1 tablet by mouth daily.     omeprazole 20 MG capsule  Commonly known as:  PRILOSEC  Take 20 mg by mouth daily.     potassium chloride 20 MEQ packet  Commonly known as:  KLOR-CON  Take 20 mEq by mouth daily.     sertraline 50 MG tablet  Commonly known as:  ZOLOFT  Take 50 mg by mouth daily.     tamsulosin 0.4 MG Caps capsule  Commonly known as:  FLOMAX  Take 0.4 mg by mouth at bedtime.     traMADol 50 MG tablet  Commonly known as:  ULTRAM  Take 1 tablet =  50 mg by mouth every 12 hours for pain; take 1 tablet = 50 mg every 8 hours as need for pain.     vitamin C 1000 MG tablet  Take 2,000 mg by mouth daily.     zinc sulfate 220 MG capsule  Take 220 mg by mouth daily.        Disposition and follow-up:   [name] was discharged from [facility] in Good condition.  At the hospital follow up visit please address:  1.  Hypokalemia; please check K and Mg after discharge. Is patient eating adequately? Fluid intake sufficient? Diarrhea? Continue Vancomycin po 125 ml tid for 7 more days. Continue restarting Lasix at a later date if necessary.   2.  Labs / imaging needed at time of follow-up: BMET, Magnesium. Patient will need close follow up regarding electrolyte levels.   3.  Pending labs/ test needing follow-up: none  Follow-up Appointments:   Discharge Instructions:  Future Appointments Provider Department Dept Phone   06/22/2013 9:00 AM Ginnie Smart, MD [facility] [phone]      Consultations:  ID, Neuro  Procedures Performed:  Ct Head Wo Contrast  06/15/2013   CLINICAL DATA:  Slurred speech  EXAM: CT HEAD WITHOUT CONTRAST  TECHNIQUE: Contiguous axial images were obtained from the base of the skull through the vertex without intravenous contrast.  COMPARISON:  Prior CT from 06/13/2013  FINDINGS: Atrophy with chronic small vessel ischemic changes is again noted, stable as compared to the prior examination. Scattered remote lacunar infarcts are also unchanged. No new intracranial hemorrhage or large vessel territory infarct is identified. Ventricles are stable in size without evidence of hydrocephalus. No extra-axial fluid collection. No mass lesion or midline shift. Prominent vascular calcifications again noted.  Calvarium is intact. The orbital soft tissues are within normal limits.  Paranasal sinuses and mastoid air cells are clear.  IMPRESSION: 1. No CT evidence of  acute intracranial hemorrhage or infarct identified. 2. Stable appearance of the brain with chronic atrophy, chronic small vessel ischemic changes, and scattered the signed report remote lacunar infarcts.   Electronically Signed   By: Rise Mu M.D.   On: 06/15/2013 23:37   Ct Head Wo Contrast  06/13/2013   CLINICAL DATA:  Altered mental status. Code stroke.  EXAM: CT HEAD WITHOUT CONTRAST  TECHNIQUE: Contiguous axial images were obtained from the base of the skull through  the vertex without intravenous contrast.  COMPARISON:  04/26/2013  FINDINGS: No mass lesion. No midline shift. No acute hemorrhage or hematoma. No extra-axial fluid collections. No evidence of acute infarction. There is diffuse cerebral cortical and cerebellar atrophy with scattered periventricular lacunar infarcts as well as a small lacunar infarct in the central portion of the medulla on image number 8, unchanged. No acute osseous abnormalities.  IMPRESSION: No acute abnormality.  Atrophy and multiple old lacunar infarcts.  Critical Value/emergent results were called by telephone at the time of interpretation on 06/13/2013 at 2:45 PM to Dr.Reynolds , who verbally acknowledged these results.   Electronically Signed   By: Geanie Cooley M.D.   On: 06/13/2013 14:45   Ct Abdomen Pelvis W Contrast  06/16/2013   CLINICAL DATA:  Abdominal pain.  EXAM: CT ABDOMEN AND PELVIS WITH CONTRAST  TECHNIQUE: Multidetector CT imaging of the abdomen and pelvis was performed using the standard protocol following bolus administration of intravenous contrast.  CONTRAST:  OMNIPAQUE IOHEXOL 300 MG/ML  SOLN  COMPARISON:  03/29/2013  FINDINGS: BODY WALL: Unremarkable.  LOWER CHEST: Extensive coronary artery atherosclerosis. Moderate sliding-type hiatal hernia. Lower lobe opacities are predominantly linear, with volume loss. Cannot exclude consolidation.  ABDOMEN/PELVIS:  Liver: No focal abnormality.  Biliary: No evidence of biliary obstruction or  stone.  Pancreas: Unremarkable.  Spleen: Calcification along the outer spleen from remote insult.  Adrenals: Unremarkable.  Kidneys and ureters: There is gas in the urinary bladder and within the lower right urinary collecting system. This is likely from refluxed gas based on urinalysis from the same day, which does not appear typical for emphysematous pyelitis. No inflammatory changes to the urothelium or kidney. No hydronephrosis. Numerous small bilateral low dense renal lesions, nonspecific due to small size.  Bladder: Gas in the bladder as above.  Mild wall thickening.  Reproductive: Unremarkable.  Bowel: No obstruction. Colonic diverticulosis. Probable appendectomy. Large duodenal diverticulum, not inflamed. There may be low dense gastric rugal thickening, which could represent gastritis or be secondary to under distension.  Retroperitoneum: No mass or adenopathy.  Peritoneum: No free fluid or gas.  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 Extensive aortic and branch vessel atherosclerosis, with notable calcified plaque at the renal artery ostia.  OSSEOUS: There is a comminuted T12 body fracture, with further osteolysis. The compression causes vertebra plana. Neighboring endplates show no evidence of new osseous erosion suggest discitis, although the disc spaces are further collapsed. Bony retropulsion has increased, the AP canal diameter now 13 mm as compared to 15 mm previously. There is further kyphosis at this level, with widening of the interspinous space at T11-T12.  Interval removal of posterior rod and screw fixation, previously spanning T9 to L3. Much of the posterior bone graft has been removed or reabsorbed.  IMPRESSION: 1. No acute intra-abdominal abnormality. 2. Gas in the urinary bladder and right renal collecting system is likely related to recent sampling,  rather than gas-forming infection. Correlate with urinalysis. 3. Unhealed T12 compression fracture with progressive height loss, kyphosis, and retropulsion since 03/29/2013. Increased T12 osteolysis may be reparative as there is no new neighboring endplate erosions to suggest discitis. If spinal osteomyelitis is a clinical possibility, MRI is recommended. 4. 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 5. Lower lobe atelectasis, with superimposed pneumonia not excluded.   Electronically Signed   By: Audry Riles.D.  On: 06/16/2013 00:44   Dg Chest Portable 1 View  06/13/2013   CLINICAL DATA:  Altered mental status. Hypotension.  EXAM: PORTABLE CHEST - 1 VIEW  COMPARISON:  05/17/2013  FINDINGS: There is slight atelectasis at the lung bases, improved. Heart size and vascularity are normal. Large hiatal hernia, unchanged. No acute osseous abnormality.  IMPRESSION: Slight bibasilar atelectasis, improved.   Electronically Signed   By: Geanie Cooley M.D.   On: 06/13/2013 13:27    2D Echo: 03/14/13  Study Conclusions: - Left ventricle: The cavity size was normal. Wall thickness was increased in a pattern of mild LVH. Systolic function was normal. The estimated ejection fraction was in the range of 55% to 60%. Wall motion was normal; there were no regional wall motion abnormalities. Left ventricular diastolic function parameters were normal. - Left atrium: The atrium was moderately dilated. - Pulmonary arteries: PA peak pressure: 31mm Hg (S).  Admission HPI:  Mr. Kwane Rohl is a 79 y.o. 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 History is mostly obtained from chart review as patient was unable to provide a history and family was not present during the interview. He has been  going to rehab facility as he was recently discharged on 05/24/13 for admission for lumbar spinal osteomyelitis. His nursing home claims that the patient was normal early this AM, but then became somewhat altered with difficulty speaking. The patient was brought to the ED and was found to be hypotensive to 66/42. A code stroke was initially called, but was then cancelled as the patient was out of the window and did not appear to have features consistent with a stroke. CT head was performed and revealed no acute abnormality. The patient was given 3 L of NS in the ED with adequate BP response to fluids. On interviewing the patient, his BP was 100's/60's-70's. He appeared in no acute distress and was able to answer simple questions. He denies any recent nausea, vomiting, abdominal pain, chest pain, SOB, fever, or chills. Complicated answers were difficult to decipher as the patient had an extremely dry mouth/tongue, contributing to his inability to speak clearly. He did mention he had some back pain and recent diarrhea.  The patient has been admitted several times for post-op wound infection s/p T9-L3 PLIF on 03/16/13, most recently on 05/18/13. His most recent admission was complicated by C. Dif infection for which he was sent home on Vancomycin po, for which he is still taking.   Hospital Course by problem list:   1. Weakness/Hypotension- On admission, patient was found to be weak, slurring his speech, and severely hypotensive at 66/42. Originally thought to be a stroke, but given clinical findings, was less likely. CT head on admission did not show any significant acute abnormalities. The patient appeared severely volume depleted and dry on admission, with significant oral dryness, crusting, skin tenting, etc. Was given 3L of NS in the ED with improvement in BP. Patient was continued on D5 NS @ 125 ml/hr with significant improvement in mental status and improvement in volume status. Adjustments were made to fluids  based on electrolyte levels; decreased to 75 ml/hr on 06/15/13 and then changed to D5 1/2NS @ 75 ml/hr on 06/06/13 based on mild hypernatremia. This was continued until discharge. On 06/20/13, the  patient appeared significantly improved. No longer dry, aware of surroundings, speech much improved.   2. C. Dif infection- Patient w/ C. Dif during last admission (05/20/13), discharged on PO vancomycin. Was also taking Cipro for previous spinal infection as per ID. On admission, swallow capabilities unknown, meds switched to IV, specifically vancomycin switched to Flagyl. When swallowing capabilities understood, switched back to po vanco. Seen by ID during this admission, suggested ending course of Cipro and continuing vancomycin po for 14 more days as patient was still receiving inciting ABx and had loose stools. C. Dif was repeated on admission, showed -ve pcr. Will continue Vancomycin for another 7 days on discharge.   3. Hypokalemia- On review of previous records, patient has a history of hypokalemia from before. Patient continued to have low K during admission and was repleted as necessary. Magnesium also shown to be slightly low initially as well, also repleted when needed. Most notably, patient was shown to have K of 2.5 on 06/17/13, which was sufficiently repleted. This K loss is most likely 2/2 diarrheal losses and poor po intake, however, TTKG was calculated and was shown to be ~7, suggesting some renal losses as well. Patient will need close follow up for hypokalemia and will be discharged with K-dur 20 mEq po qd.   4. Leukocytosis- On day 2 of admission, WBC's were 19.6. No fever noted. Workup for infection did not suggest any UTI, pneumonia, or intra-abdominal process (besides previous C. Dif), even after CT abdomen and repeat CT head. Patient did have previous very complicated spinal surgery with multiple infection including hardware involvement and osteomyelitis. Orthopedic surgeon, Dr. Shon Baton saw  patient and did not believe spine was involved and appeared to be healing well. Also seen by wound care who felt that the wound looked clean, dry, and intact. Also seen by ID during admission (see above), did not feel patient had signs of infection. Leukocytosis resolved on 06/06/13.  5. Lumbar spinal surgery/infection- The patient has been admitted several times for post-op wound infection s/p T9-L3 PLIF on 03/16/13. Seen by Dr. Shon Baton during admission and felt that spinal issues were not involved with this admission. Wound was intact and with no signs of infection.  6. HTN- Hypotensive on admission, resolved with fluids. Patient with known history of HTN, takes Lisinopril and Lasix at home. Restarted Lisinopril once BP returned to normal/higher levels. Continued holding Lasix given patient's low K and original volume status.  7. CAD- Stable during admission. Troponins -ve x 3 during this admission. EKG w/ no dynamic changes.   8. DM type II- Most recent HbA1c 5.1. No issues during this admission.   Discharge Vitals:   BP 150/75  Pulse 70  Temp(Src) 98 F (36.7 C) (Oral)  Resp 19  Ht 5\' 10"  (1.778 m)  Wt 153 lb 11.2 oz (69.718 kg)  BMI 22.05 kg/m2  SpO2 98%  Discharge Labs:  Results for orders placed during the hospital encounter of 06/13/13 (from the past 24 hour(s))  GLUCOSE, CAPILLARY     Status: Abnormal   Collection Time    06/20/13 12:10 PM      Result Value Range   Glucose-Capillary 140 (*) 70 - 99 mg/dL   Comment 1 Notify RN     Comment 2 Documented in Chart    GLUCOSE, CAPILLARY     Status: Abnormal   Collection Time    06/20/13  5:07 PM      Result Value Range   Glucose-Capillary 162 (*) 70 - 99  mg/dL  BASIC METABOLIC PANEL     Status: Abnormal   Collection Time    06/20/13  9:03 PM      Result Value Range   Sodium 139  135 - 145 mEq/L   Potassium 3.8  3.5 - 5.1 mEq/L   Chloride 104  96 - 112 mEq/L   CO2 28  19 - 32 mEq/L   Glucose, Bld 91  70 - 99 mg/dL   BUN 5  (*) 6 - 23 mg/dL   Creatinine, Ser 1.61 (*) 0.50 - 1.35 mg/dL   Calcium 8.5  8.4 - 09.6 mg/dL   GFR calc non Af Amer >90  >90 mL/min   GFR calc Af Amer >90  >90 mL/min  GLUCOSE, CAPILLARY     Status: Abnormal   Collection Time    06/20/13  9:53 PM      Result Value Range   Glucose-Capillary 107 (*) 70 - 99 mg/dL  BASIC METABOLIC PANEL     Status: Abnormal   Collection Time    06/21/13  4:25 AM      Result Value Range   Sodium 138  135 - 145 mEq/L   Potassium 4.4  3.5 - 5.1 mEq/L   Chloride 106  96 - 112 mEq/L   CO2 27  19 - 32 mEq/L   Glucose, Bld 104 (*) 70 - 99 mg/dL   BUN 5 (*) 6 - 23 mg/dL   Creatinine, Ser 0.45  0.50 - 1.35 mg/dL   Calcium 8.2 (*) 8.4 - 10.5 mg/dL   GFR calc non Af Amer >90  >90 mL/min   GFR calc Af Amer >90  >90 mL/min  CBC     Status: Abnormal   Collection Time    06/21/13  4:25 AM      Result Value Range   WBC 7.0  4.0 - 10.5 K/uL   RBC 3.57 (*) 4.22 - 5.81 MIL/uL   Hemoglobin 10.3 (*) 13.0 - 17.0 g/dL   HCT 40.9 (*) 81.1 - 91.4 %   MCV 85.7  78.0 - 100.0 fL   MCH 28.9  26.0 - 34.0 pg   MCHC 33.7  30.0 - 36.0 g/dL   RDW 78.2 (*) 95.6 - 21.3 %   Platelets 169  150 - 400 K/uL  MAGNESIUM     Status: None   Collection Time    06/21/13  4:25 AM      Result Value Range   Magnesium 1.9  1.5 - 2.5 mg/dL    Signed: Courtney Paris, MD 06/21/2013, 11:56 AM   Time Spent on Discharge: 35 minutes Services Ordered on Discharge: SNF Equipment Ordered on Discharge: none

## 2013-06-21 NOTE — Progress Notes (Signed)
OT Cancellation Note  Patient Details Name: [name] [name] MRN: [id number] DOB: 02-03-1934   Cancelled Treatment:    Reason Eval/Treat Not Completed: Other (comment). Pt preparing to d/c to SNF  Galen Manila 06/21/2013, 2:58 PM

## 2013-06-21 NOTE — Discharge Summary (Signed)
  Date: 06/21/2013  Patient name: [name] [name]  Medical record number: [id number]  Date of birth: April 11, 1934   This patient has been seen and the plan of care was discussed with the house staff. Please see their note for complete details. I concur with their findings with the following additions/corrections: Seen and examined. He is medically stable for discharge to facility.  Jonah Blue, DO, FACP [facility] 06/21/2013, 1:46 PM

## 2013-06-21 NOTE — Progress Notes (Signed)
Subjective: Mr. Brogan Martis is a 79 y.o. 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 In the ED, the patient had significant HoTN (SBP into the 60's), and volume depletion. BP returned close to normal after 3 L bolus.   Seen at bedside this AM. Patient says he is feeling good. Still with poor appetite, says he feels full all of the time. Has been eating okay though. Denies any other complaints.   Objective: Vital signs in last 24 hours: Filed Vitals:   06/20/13 0825 06/20/13 1443 06/20/13 1736 06/20/13 2156  BP: 135/80 125/77 132/79 162/82  Pulse: 67 76 81 74  Temp: 98.4 F (36.9 C) 97.9 F (36.6 C) 97.4 F (36.3 C) 97.8 F (36.6 C)  TempSrc: Oral Oral Oral Oral  Resp: 18 18 18 18   Height:      Weight:    153 lb 11.2 oz (69.718 kg)  SpO2: 95% 95% 96% 96%   Weight change: -1 lb 11.6 oz (-0.782 kg)  Intake/Output Summary (Last 24 hours) at 06/21/13 0960 Last data filed at 06/20/13 1847  Gross per 24 hour  Intake 2023.75 ml  Output    302 ml  Net 1721.75 ml   Physical Exam: General: Alert, cooperative, and no acute distress. A&O x3 HEENT: Vision grossly intact. Neck: Full range of motion without pain, supple, no lymphadenopathy or carotid bruits Lungs: Clear to ascultation bilaterally, normal work of respiration, no wheezes, rales, ronchi Heart: Regular rate and rhythm, no murmurs, gallops, or rubs Abdomen: Soft, non-tender, non-distended, normal bowel sounds Extremities: No cyanosis, clubbing, or edema Back: With healing surgical wounds, does not appear infected. Granulation tissue present, no pus or bleeding present, no foul odor. Re-dressed by wound care yesterday. Neurologic: Alert & oriented x3, Cranial nerves II-XII intact, strength grossly intact, sensation  intact to light touch  Lab Results: Basic Metabolic Panel:  Recent Labs Lab 06/19/13 0725 06/20/13 0536 06/20/13 2103  NA 139 140 139  K 3.6 3.0* 3.8  CL 107 108 104  CO2 25 26 28   GLUCOSE 92 104* 91  BUN 4* 4* 5*  CREATININE 0.44* 0.44* 0.48*  CALCIUM 8.2* 8.1* 8.5  MG 1.8 1.8  --   PHOS  --  3.1  --    Liver Function Tests: No results found for this basename: AST, ALT, ALKPHOS, BILITOT, PROT, ALBUMIN,  in the last 168 hours CBC:  Recent Labs Lab 06/15/13 0710  06/18/13 0550 06/21/13 0425  WBC 11.0*  < > 7.2 7.0  NEUTROABS 9.2*  --   --   --   HGB 10.3*  < > 9.7* 10.3*  HCT 31.0*  < > 29.1* 30.6*  MCV 86.4  < > 84.3 85.7  PLT 200  < > 180 169  < > = values in this interval not displayed.  Cardiac Enzymes: No results found for this basename: CKTOTAL, CKMB, CKMBINDEX, TROPONINI,  in the last 168 hours CBG:  Recent Labs Lab 06/19/13 1653 06/19/13 2221 06/20/13 0825 06/20/13 1210 06/20/13 1707 06/20/13 2153  GLUCAP 103* 95 108* 140* 162* 107*   Hemoglobin A1C: No results found for this basename: HGBA1C,  in the last 168 hours Coagulation: No results found for this basename: LABPROT, INR,  in the last 168 hours Urine Drug Screen:  Drugs of Abuse     Component Value Date/Time   LABOPIA NONE DETECTED 06/13/2013 1332   COCAINSCRNUR NONE DETECTED 06/13/2013 1332   LABBENZ NONE DETECTED 06/13/2013 1332   AMPHETMU NONE DETECTED 06/13/2013 1332   THCU POSITIVE* 06/13/2013 1332   LABBARB NONE DETECTED 06/13/2013 1332    Alcohol Level: No results found for this basename: ETH,  in the last 168 hours Urinalysis:  Recent Labs Lab 06/15/13 1804 06/16/13 0346  COLORURINE YELLOW YELLOW  LABSPEC 1.018 1.042*  PHURINE 5.0 6.0  GLUCOSEU NEGATIVE NEGATIVE  HGBUR TRACE* NEGATIVE  BILIRUBINUR NEGATIVE NEGATIVE  KETONESUR NEGATIVE NEGATIVE  PROTEINUR NEGATIVE NEGATIVE  UROBILINOGEN 0.2 0.2  NITRITE NEGATIVE NEGATIVE  LEUKOCYTESUR SMALL* NEGATIVE   Micro  Results: Recent Results (from the past 240 hour(s))  URINE CULTURE     Status: None   Collection Time    06/13/13  1:32 PM      Result Value Range Status   Specimen Description URINE, CATHETERIZED   Final   Special Requests NONE   Final   Culture  Setup Time     Final   Value: 06/13/2013 18:06     Performed at [facility] Count     Final   Value: NO GROWTH     Performed at [facility]   Culture     Final   Value: NO GROWTH     Performed at [facility]   Report Status 06/14/2013 FINAL   Final  CULTURE, BLOOD (ROUTINE X 2)     Status: None   Collection Time    06/13/13  2:00 PM      Result Value Range Status   Specimen Description BLOOD LEFT HAND   Final   Special Requests BOTTLES DRAWN AEROBIC ONLY 10CC   Final   Culture  Setup Time     Final   Value: 06/13/2013 19:46     Performed at [facility]   Culture     Final   Value: NO GROWTH 5 DAYS     Performed at [facility]   Report Status 06/19/2013 FINAL   Final  CULTURE, BLOOD (ROUTINE X 2)     Status: None   Collection Time    06/13/13  2:30 PM      Result Value Range Status   Specimen Description BLOOD RIGHT HAND   Final   Special Requests BOTTLES DRAWN AEROBIC ONLY 10CC   Final   Culture  Setup Time     Final   Value: 06/13/2013 19:46     Performed at [facility]   Culture     Final   Value: NO GROWTH 5 DAYS     Performed at [facility]   Report Status 06/19/2013 FINAL   Final  MRSA PCR SCREENING     Status: Abnormal   Collection Time    06/13/13  6:43 PM      Result Value Range Status   MRSA by PCR POSITIVE (*) NEGATIVE Final   Comment:            The GeneXpert MRSA Assay (FDA     approved for NASAL specimens     only), is one component of a     comprehensive MRSA colonization     surveillance program. It is not     intended to diagnose MRSA     infection nor to guide or     monitor treatment for  MRSA infections.     RESULT CALLED  TO, READ BACK BY AND VERIFIED WITH:     Kennis Carina RN [id number] 06/13/13 A BROWNING  CLOSTRIDIUM DIFFICILE BY PCR     Status: None   Collection Time    06/14/13  9:17 AM      Result Value Range Status   C difficile by pcr NEGATIVE  NEGATIVE Final   Medications: I have reviewed the patient's current medications. Scheduled Meds: . antiseptic oral rinse  15 mL Mouth Rinse q12n4p  . atorvastatin  10 mg Oral q1800  . brimonidine  1 drop Both Eyes BID  . chlorhexidine  15 mL Mouth Rinse BID  . dronabinol  2.5 mg Oral BID AC  . feeding supplement (ENSURE COMPLETE)  237 mL Oral BID BM  . feeding supplement (PRO-STAT SUGAR FREE 64)  30 mL Oral BID  . ferrous sulfate  325 mg Oral BID WC  . heparin  5,000 Units Subcutaneous Q8H  . insulin aspart  0-9 Units Subcutaneous TID WC  . lisinopril  5 mg Oral Daily  . pantoprazole  40 mg Oral Daily  . sertraline  50 mg Oral Daily  . sodium chloride  3 mL Intravenous Q12H   Continuous Infusions: . dextrose 5 % and 0.45% NaCl 75 mL/hr at 06/20/13 1154   PRN Meds:.acetaminophen, acetaminophen, albuterol, ipratropium, ondansetron (ZOFRAN) IV, ondansetron, zinc oxide  Assessment/Plan: Mr. Kieon Lawhorn is a 79 y.o. 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 In the ED, the patient had significant HoTN (SBP into the 60's), and volume depletion.   Slurred speech/weakness- Patient is elderly gentleman brought from NH, originally a code stroke. The patient was out of the window, symptoms did not correlate with a stroke, and CT on admission was -ve for acute abnormality. Code Stroke was cancelled. It appears that the patient's symptoms may be d/t significant dehydration and volume depletion as the patient was significantly hypotensive on admission.    Patient's mental status has improved, volume status also improved.  -Continue another 8 days of Vanco po for C. Dif as patient still has loose stools and now Cipro has been stopped.  -Neuro checks q4h  -PT eval recommends SNF placement. Patient has a bed at this time. -Continue dysphagia 2 diet. -Continue D5 1/2NS @ 75 ml/hr. -Fall precautions   Hypotension- Resolved. BP 125/77 this AM. -Continue Lisinopril 5 mg po qd -Continue D5 1/2NS @ 75 ml/hr  Hypokalemia- K 4.4 today. Mag 1.9. TTKG calculated as 7.97 yesterday. In a patient with appropriate reduction of K excretion in hypokalemia, expected TTKG is K<3. Therefore, K is being inappropraitely wasted in this case.  -Will discharge with K-dur 20 mEq po qd  Leukocytosis- Resolved. No further signs of infection at this time. -Will continue Vanco po for 8 more days (see above for further details).  DM type II- Most recent A1c 5.2 in 03/2013.  -Patient with poor po intake, but improving according to the nursing staff. Continue with D5 at this time. -On ISS sensitive for now.  -CBG's  AC/HS.  HTN- Home medications- Lasix 20 mg bid, Lisinopril 5 mg po qd  -Restarted Lisinopril 5 mg po qd -Continue to hold Lasix given the patient's chronic hypokalemia.  CAD- Patient with CABG in 2002.  Stable  HLD- Most recent lipid profile wnl.  -Continue statin   Gout- Stable, not on any medications at this time.   BPH/Prostate CA- No issues at this time. Had radiation therapy 05/05/13.  -Hold Flomax for now given HoTN   Dispo: Disposition is deferred at this time, awaiting improvement of current medical problems.  Anticipated discharge in approximately 2-3 day(s).  -SW looking for placement. Daughter given a list of places for placement and she will do research as to where she would like Mr. Gildersleeve to be discharged to. Will work further to discharge patient in timely manner.   The patient does not have a current PCP (Provider Not In System) and  does need an [facility] hospital follow-up appointment after discharge.  The patient does have transportation limitations that hinder transportation to clinic appointments.  .Services Needed at time of discharge: Y = Yes, Blank = No PT:   OT:   RN:   Equipment:   Other:     LOS: 8 days   Courtney Paris, MD 06/21/2013, 6:13 AM Pager: [phone]

## 2013-06-21 NOTE — Progress Notes (Signed)
Patient discharged to [facility]. Report called to Pattricia Boss, RN at the [facility]. Patient AVS faxed to facility.  Patient remains stable; no signs or symptoms of distress.  Patient transported via EMS with belongings at his side, dentures in and glasses on.

## 2013-06-22 ENCOUNTER — Inpatient Hospital Stay: Payer: Medicare Other | Admitting: Infectious Diseases

## 2013-06-23 LAB — VITAMIN B1: Vitamin B1 (Thiamine): 14 nmol/L (ref 8–30)

## 2013-06-28 LAB — VITAMIN B6: Vitamin B6: 6.7 ng/mL (ref 2.1–21.7)

## 2013-07-07 NOTE — Progress Notes (Signed)
[name]  Patient ID: [name] [name], male   DOB: 10/10/1933, 79 y.o.   MRN: [id number]   06/13/2013   Rm# [id number]    CODE STATUS: Full Code   No Known Allergies  Chief Complaint  Patient presents with  . Acute Visit      History of present illness:  Physical therapist reported to me this a.m., at approximately 1045 to 11 AM, but the patient was too weak and unable to participate in physical therapy.  She asked for consultation with the patient regarding to increased lethargy, and confusion The patient was noted to have the 50% of his breakfast this morning. Per the nursing report, there was a sharp and sudden decline between 8:30 AM and 11 AM.   Pt with history of lumbar spine surgery, subsequently developing osteomyelitis, requiring surgical debridement, and extensive IV antibiotic therapy.  Developed severe diarrhea while hospitalized, was diagnosed with C-diff after admission to [facility], was immediately started on Flagyl, then had a course of vancocin, with resolution of the diarrhea.  Mr. Kaeding has been given multiple liters of D5 half-normal saline via clysis and IV, related to fluid deficits from the C. Difficile.   Past Medical History  Diagnosis Date  . Hypertension   . Chronic kidney disease     bph  . Arthritis   . Coronary artery disease   . High cholesterol   . Myocardial infarction 2002    "before his lungs or legs" (05/05/2013)  . DVT (deep venous thrombosis)     "after back OR in August" (05/05/2013)  . Type II diabetes mellitus   . History of blood transfusion     "some since OR in 03/2013" (05/05/2013)  . Chronic lower back pain   . Gout   . BPH (benign prostatic hypertrophy)   . Prostate cancer     "S/P radiation tx" ((05/05/2013)  . Skin cancer of face     "had several taken off his face; had his nose cut/reconstructed" (05/05/2013)  . Shortness of breath    Past Surgical History  Procedure Laterality Date  . Appendectomy    .  Coronary artery bypass graft  11/09/2000    LIMA to LAD, SVG to RPDA, seq SVG to OM2 and D1 by [name] [facility]  . Lumbar fusion  03/15/2013    Dr Shon Baton  . Posterior lumbar fusion 4 level N/A 03/16/2013    Procedure: T9 - L3 POSTERIOR POSTERIOR SPINAL FUSION ;  Surgeon: Venita Lick, MD;  Location: [facility] OR;  Service: Orthopedics;  Laterality: N/A;  . Hardware removal N/A 03/30/2013    Procedure: HARDWARE REMOVAL/IRRIGATION & DEBRIDEMENT OF WOUND ON BACK;  Surgeon: Venita Lick, MD;  Location: [facility] OR;  Service: Orthopedics;  Laterality: N/A;  . Incision and drainage of wound N/A 04/13/2013    Procedure: IRRIGATION AND DEBRIDEMENT WOUND AND VAC DRESSING;  Surgeon: Venita Lick, MD;  Location: [facility] OR;  Service: Orthopedics;  Laterality: N/A;  . Application of wound vac  04/13/2013; 05/04/2013  . Back surgery    . Cataract extraction w/ intraocular lens  implant, bilateral Bilateral   . Secondary closure of wound N/A 05/05/2013    Procedure: WASH-OUT CLOSURE OF WOUND PARTIAL;  Surgeon: Venita Lick, MD;  Location: [facility] OR;  Service: Orthopedics;  Laterality: N/A;  . Incision and drainage of wound N/A 05/18/2013    Procedure: REMOVAL WOUND VAC, IRRIGATION AND DEBRIDEMENT,  WOUND CLOSURE POSTERIOR THORACOLUMBAR INCISION  ;  Surgeon: Venita Lick, MD;  Location: [facility] OR;  Service: Orthopedics;  Laterality: N/A;     Medications:  Acetaminophen 650 mg every 6 hours as needed  Vitamin C 1000 mg twice a day  Pepto-Bismol 524 mg every 6 hours as needed for indigestion/diarrhea  Lipitor 10 mg  Alphagan opth sol, 0.2%  Lipitor 10 mg  Calcium carbonate, 600/200 per day.  Vitamin D3 1000 units per day  Famotidine 20 mg per day  Ferrous sulfate 325 mg twice a day  ProStat 30 mL as twice a day  Cipro 500 mg twice a day  Glucerna 237 mL as per day  DuoNeb 3 mL is every 6 hours when necessary wheezing  Lisinopril 5 mg  Multivitamin with mineral one per day  Potassium, 20 mEq per day  Zoloft 50 mg    Flomax 0.4 mg at bedtime  Tramadol 50 mg every 8 hours as needed.  Lab results from 06/13/2013 AM  Sodium 145  Potassium 3.3  Chloride 105  CO2 32  AGAP 8  Glucose 109  BUN 8  Creatinine 0.5  BUN/CR 15.8  Calcium 8.6   Complete blood count from 06/06/2013  WBC 8.7  RBC 4.1  Hemoglobin 11.8  Hematocrit 38.2  MCV 93.6  MCH 28.9  MCHC 30.9  RDW 17.6  Platelets 193  Differential was completely unremarkable.   Vital signs:  Blood pressure 73/41  Pulse oximetry 95%  Pulse approximately 90  Respiratory rate 20.  Capillary blood glucose 119   The patient is awake, speech is extremely garbled and difficult to understand. I did tell the patient that he was going to the hospital and he did repeat hospital but otherwise his speech was extremely difficult to understand.   Movement of all 4 extremities, and certainly a positive response to pain as noted by doing a fingerstick blood glucose testing   Extraocular movements intact, and pupils appear to be equally round reactive to light.   Apical pulse is a regular rate and rhythm.   Bilateral breath sounds are entirely completely clear.   Abdomen appears nontender.   Assessment/Plan  Based on the evidence of a sharp and sudden decline, decision made to call 911 and have patient transported to hospital.   The emergency service personnel arrived in less than 15 minutes, and IV line was established, the patient transported to [facility].

## 2013-07-07 NOTE — Progress Notes (Signed)
Patient ID: [name] [name], male   DOB: Dec 23, 1933, 79 y.o.   MRN: [id number] Full Code [name], Rm 103  May 25, 2013   No Known Allergies  Chief Complaint  Patient presents with  . Hospitalization Follow-up   History of present illness:   Pt admitted to [facility] for Rehab, mobility, strengthening, wound care, and nutrition augmentation.  Pt with history of lumbar spine surgery, subsequently developing osteomyelitis, requiring surgical debridement, and extensive IV antibiotic therapy.  During his hospitalization, developed severe liquid diarrhea, but, diagnosis of C-Diff was not established..  Past Medical History  Diagnosis Date  . Hypertension   . Chronic kidney disease     bph  . Arthritis   . Coronary artery disease   . High cholesterol   . Myocardial infarction 2002    "before his lungs or legs" (05/05/2013)  . DVT (deep venous thrombosis)     "after back OR in August" (05/05/2013)  . Type II diabetes mellitus   . History of blood transfusion     "some since OR in 03/2013" (05/05/2013)  . Chronic lower back pain   . Gout   . BPH (benign prostatic hypertrophy)   . Prostate cancer     "S/P radiation tx" ((05/05/2013)  . Skin cancer of face     "had several taken off his face; had his nose cut/reconstructed" (05/05/2013)  . Shortness of breath    Review of Systems  HENT: Negative for ear discharge and nosebleeds.   Eyes: Negative for discharge and redness.  Respiratory: Negative for hemoptysis and sputum production.   Cardiovascular: Negative for orthopnea and PND.  Gastrointestinal: Positive for nausea and diarrhea.  Genitourinary: Negative for hematuria and flank pain.  Musculoskeletal: Positive for back pain.  Neurological: Positive for weakness. Negative for seizures and loss of consciousness.  Psychiatric/Behavioral: Positive for depression. Negative for hallucinations.    Past Surgical History  Procedure Laterality Date  . Appendectomy      . Coronary artery bypass graft  11/09/2000    LIMA to LAD, SVG to RPDA, seq SVG to OM2 and D1 by [name] [facility]  . Lumbar fusion  03/15/2013    Dr Shon Baton  . Posterior lumbar fusion 4 level N/A 03/16/2013    Procedure: T9 - L3 POSTERIOR POSTERIOR SPINAL FUSION ;  Surgeon: Venita Lick, MD;  Location: [facility] OR;  Service: Orthopedics;  Laterality: N/A;  . Hardware removal N/A 03/30/2013    Procedure: HARDWARE REMOVAL/IRRIGATION & DEBRIDEMENT OF WOUND ON BACK;  Surgeon: Venita Lick, MD;  Location: [facility] OR;  Service: Orthopedics;  Laterality: N/A;  . Incision and drainage of wound N/A 04/13/2013    Procedure: IRRIGATION AND DEBRIDEMENT WOUND AND VAC DRESSING;  Surgeon: Venita Lick, MD;  Location: [facility] OR;  Service: Orthopedics;  Laterality: N/A;  . Application of wound vac  04/13/2013; 05/04/2013  . Back surgery    . Cataract extraction w/ intraocular lens  implant, bilateral Bilateral   . Secondary closure of wound N/A 05/05/2013    Procedure: WASH-OUT CLOSURE OF WOUND PARTIAL;  Surgeon: Venita Lick, MD;  Location: [facility] OR;  Service: Orthopedics;  Laterality: N/A;  . Incision and drainage of wound N/A 05/18/2013    Procedure: REMOVAL WOUND VAC, IRRIGATION AND DEBRIDEMENT,  WOUND CLOSURE POSTERIOR THORACOLUMBAR INCISION  ;  Surgeon: Venita Lick, MD;  Location: [facility] OR;  Service: Orthopedics;  Laterality: N/A;    Current Outpatient Prescriptions on File Prior to Visit  Medication Sig Dispense  Refill  . acetaminophen (TYLENOL) 325 MG tablet Take 650 mg by mouth every 6 (six) hours as needed for pain.      . Ascorbic Acid (VITAMIN C) 1000 MG tablet Take 2,000 mg by mouth daily.      [name] atorvastatin (LIPITOR) 10 MG tablet Take 10 mg by mouth daily.      . brimonidine (ALPHAGAN) 0.2 % ophthalmic solution Place 1 drop into both eyes 2 (two) times daily.      . Calcium Carbonate-Vitamin D (CALCIUM 600+D) 600-200 MG-UNIT TABS Take 1 tablet by mouth daily.      . cholecalciferol (VITAMIN D) 1000 UNITS  tablet Take 1,000 Units by mouth daily.      [name] dextrose (GLUTOSE) 40 % GEL Take 1 Tube by mouth once as needed (for hypotension).      [name] dronabinol (MARINOL) 2.5 MG capsule Take 2.5 mg by mouth 3 (three) times daily.      . famotidine (PEPCID) 20 MG tablet Take 20 mg by mouth daily.      . feeding supplement (PRO-STAT SUGAR FREE 64) LIQD Take 30 mLs by mouth 2 (two) times daily.       . ferrous sulfate 325 (65 FE) MG tablet Take 325 mg by mouth 2 (two) times daily with a meal.      . glucagon (GLUCAGEN) 1 MG SOLR injection Inject 1 mg into the vein once as needed.      [name] GLUCERNA (GLUCERNA) LIQD Take 237 mLs by mouth daily. Vanilla      . ipratropium-albuterol (DUONEB) 0.5-2.5 (3) MG/3ML SOLN Take 3 mLs by nebulization every 6 (six) hours as needed (for shortness of breath).      [name] lisinopril (PRINIVIL,ZESTRIL) 5 MG tablet Take 5 mg by mouth daily.      . Multiple Vitamins-Minerals (MULTIVITAMIN WITH MINERALS) tablet Take 1 tablet by mouth daily.      . potassium chloride (KLOR-CON) 20 MEQ packet Take 20 mEq by mouth daily.       . sertraline (ZOLOFT) 50 MG tablet Take 50 mg by mouth daily.       . tamsulosin (FLOMAX) 0.4 MG CAPS Take 0.4 mg by mouth at bedtime.       [name] zinc sulfate 220 MG capsule Take 220 mg by mouth daily.       No current facility-administered medications on file prior to visit.    Recent Labs:  Vital Signs: Temp: 98.4 BP: 146/62 Pulse Rate: 97      Recent Labs   05/15/13 0640  05/17/13 0810   WBC  6.0  11.7*   HGB  9.4*  9.7*   PLT  153  151   CREATININE  0.45*  0.40*    Labs, from 05/25/2013  WBC 7.8 RBC 3.3 Hemoglobin 9.7 Hematocrit 31.1 MCV 93.1 MCH 29 MCHC 31.2 RDW 18.2 Platelets 156  Hemoglobin A1c 4.2  Sodium 142 Potassium 3.6 Chloride 107 CO2 31 AGAP 4 Glucose 102 BUN 14 Creatinine 0.5 BUN/CR 26 Calcium 8.2  Physical examination:   Alert, depressed affect but in no acute distress   Pupils are round, equally reactive to  light, and extraocular movements intact. Positive red reflex .  Tympanic membranes unremarkable. There is a significant amount of cerumen in the right ear canal.   Only very few remaining teeth, but no gross carious disease   No palpable adenopathy no palpable thyromegaly. Carotid bruits are not evident.   Bilateral breath sounds are clear.   Apical  pulse regular rate and rhythm   Bowel sounds are hyperactive. Abdomen without distinct tenderness.   Bilateral lower extremities with easily palpable posterior tibial pulses and edema over the medial ankles The back wound a dressing is in place. Wound care is managed by the wound care nurse.   Assessment/plan:   Concerned for the diarrhea, have to consider C. difficile. We'll order a stool sample to be sent as soon as possible. Will start  Flagyl 500 mg, as soon as the stool sample is collected, 1 twice a day x10 days as first-line treatment.   Have also ordered a clear liquid/low residue diet to allow him hydration without contributing to additional diarrhea. We will consider IV fluids or  via clysis if necessary. We'll continue to carefully check his renal function and BUN in particular, to address any possible dehydration.  We'll also monitor urinary output as indicated.  Malnutrition, will continue his vitamin C 1000 mg, 2 tablets once a day, Calcium 600/200 of vitamin D 1 tablet each day, vitamin D, 1000 units each day Marinol 2.5 mg, one 3 times a day, Pro-Stat feeding supplement, 30 mL twice a day, ferrous sulfate 325 mg twice a day, Glucerna, 237 mL was each day, multivitamin with minerals 1 tablet each day, and zinc sulfate, 220 mg once a day.Maryclare Labrador continue to monitor very carefully.  Depression, Zoloft 50 mg each day  Hypokalemia, will continue potassium chloride 20 mEq per day, and will continue to monitor his potassium level for any additional supplementation.  GERD, continue Pepcid 20 mg each day, will continue to  monitor.  Hyperlipidemia, continue Lipitor at 10 mg per day. We will continue to monitor, and assess liver enzymes.  COPD, DuoNeb 0.5/2.5 per 3 mL , 3 ML's via nebulization every 6 hours as needed for shortness of breath  Hypertension, with, lisinopril 5 mg once a day.  Prostate hypertrophy, will continue Flomax at 0.4 mg at bedtime, will continue to monitor.  Diabetes, with the patient's weight loss, and very low A1c, no diabetes medications are indicated at this time. Dextrose 20% gel to, one 2 by mouth times one as needed for any hypoglycemia. Certainly will continue to monitor his glucose levels. Wound care to be managed by the wound care nurse, imperative to control diarrhea and address any C. difficile infection, to keep stool out of the wound.  Also discussed patient's status with Dr. Chilton Si.

## 2013-10-18 ENCOUNTER — Other Ambulatory Visit: Payer: Self-pay | Admitting: Orthopedic Surgery

## 2013-10-18 ENCOUNTER — Ambulatory Visit
Admission: RE | Admit: 2013-10-18 | Discharge: 2013-10-18 | Disposition: A | Discharge status: Home / Self Care | Payer: Medicare Other | Source: Ambulatory Visit | Attending: Orthopedic Surgery | Admitting: Orthopedic Surgery

## 2013-10-18 DIAGNOSIS — R52 Pain, unspecified: Secondary | ICD-10-CM

## 2014-02-20 ENCOUNTER — Encounter (HOSPITAL_BASED_OUTPATIENT_CLINIC_OR_DEPARTMENT_OTHER): Payer: Medicare Other | Attending: Plastic Surgery

## 2014-02-20 DIAGNOSIS — N189 Chronic kidney disease, unspecified: Secondary | ICD-10-CM | POA: Diagnosis not present

## 2014-02-20 DIAGNOSIS — Z79899 Other long term (current) drug therapy: Secondary | ICD-10-CM | POA: Diagnosis not present

## 2014-02-20 DIAGNOSIS — L905 Scar conditions and fibrosis of skin: Secondary | ICD-10-CM | POA: Diagnosis present

## 2014-02-20 DIAGNOSIS — Z8546 Personal history of malignant neoplasm of prostate: Secondary | ICD-10-CM | POA: Insufficient documentation

## 2014-02-20 DIAGNOSIS — I129 Hypertensive chronic kidney disease with stage 1 through stage 4 chronic kidney disease, or unspecified chronic kidney disease: Secondary | ICD-10-CM | POA: Insufficient documentation

## 2014-02-20 DIAGNOSIS — Z9089 Acquired absence of other organs: Secondary | ICD-10-CM | POA: Diagnosis not present

## 2014-02-20 DIAGNOSIS — Z951 Presence of aortocoronary bypass graft: Secondary | ICD-10-CM | POA: Insufficient documentation

## 2014-02-20 DIAGNOSIS — Z923 Personal history of irradiation: Secondary | ICD-10-CM | POA: Insufficient documentation

## 2014-02-20 DIAGNOSIS — Z86718 Personal history of other venous thrombosis and embolism: Secondary | ICD-10-CM | POA: Insufficient documentation

## 2014-02-20 DIAGNOSIS — I252 Old myocardial infarction: Secondary | ICD-10-CM | POA: Diagnosis not present

## 2014-02-20 DIAGNOSIS — I2510 Atherosclerotic heart disease of native coronary artery without angina pectoris: Secondary | ICD-10-CM | POA: Diagnosis not present

## 2014-02-20 DIAGNOSIS — Z981 Arthrodesis status: Secondary | ICD-10-CM | POA: Diagnosis not present

## 2014-02-20 DIAGNOSIS — Z85828 Personal history of other malignant neoplasm of skin: Secondary | ICD-10-CM | POA: Diagnosis not present

## 2014-02-21 NOTE — Consult Note (Signed)
[name] [name], [name] [name]             ACCOUNT NO.:  [id number]  MEDICAL RECORD NO.:  [id number]  LOCATION:  FOOT                         FACILITY:  [facility]  PHYSICIAN:  Irene Limbo, MD   DATE OF BIRTH:  [birth year]  DATE OF CONSULTATION:  02/20/2014 DATE OF DISCHARGE:                                CONSULTATION   CHIEF COMPLAINT:  Back wound.  HISTORY OF PRESENT ILLNESS:  The patient is a 80 year old ambulatory male that is accompanied by his daughter.  The patient is here for evaluation of a back wound.  This all started in July 2014 at which time he underwent posterior interbody fusion from T9 to T11.  His course was complicated by infection requiring operative debridement followed by removal of all of his back hardware.  Currently, he is ambulatory with a walker and must wear a back brace while ambulating. He did undergo treatment with IV antibiotics for prolonged period from review of the chart.  Wound culture from August 2014 revealed few Serratia.  He has had normal hemoglobin A1c over last several visits and they report his oral medication for diabetes has been discontinued at this time.  They report that the patient was in [facility] followed by the nursing facility within the [facility].  They report that they have often been counseled that the wound is completely closed.  However, he does have recurrent drainage from the area at times and continues to have significant pain in the area.  We have no recent laboratory available for review.  PAST MEDICAL HISTORY:  Hypertension, chronic kidney disease, coronary artery disease, status post CABG, history of myocardial infarction in 2002, history of deep venous thrombosis following his recent back surgery in September 2014, history of diabetes mellitus that appears to be resolved at this point, chronic low-back pain, history of skin cancers of his face requiring plastic surgery reconstruction, history of prostate  cancer, post radiation treatment.  PAST SURGICAL HISTORY:  Coronary artery bypass graft in 2002, appendectomy, lumbar fusion in July 2014 by Dr. Rolena Infante followed by I and D and removal of hardware in August 2014.  He had application of wound VAC in August and September 2014 and completed secondary closure of the wound throughout the December and October 2014.  SOCIAL HISTORY:  The patient was never a smoker.  MEDICATIONS:  Simvastatin, fish oil, losartan, Alphagan, vitamin D, Marinol, Pepcid, ferrous sulfate, glucagon, DuoNeb, lisinopril, and Tylenol.  ALLERGIES:  No known drug allergies.  PHYSICAL EXAMINATION:  The patient has extensive scar over the midline of the back all of which have healed.  Over the lower back, there is an depressed area with overhanging skin.  The skin in this area is nonmobile over the underlying tissue and it appears to be tethered to the underlying spinous processes.  There is no cellulitis.  There is maceration within the overhanging skin.  ASSESSMENT:  It appears at least on his visit today that he has completely healed his wound.  However, the scarring has healed in a manner that has a depressed wound and with overhanging skin, this is subject to retaining moisture especially in the setting of his chronic brace use.  I suspect  that he has had chronic reulceration of the area due to the maceration of the wound.  I have counseled them to leave the wound open to air while he is at home and not using his brace.  We did place collagen and foam dressing today to see whether this aids with his significant pain that is nearly pinpoint in this area of the tethered scar.  I offered surgical excision of the area and possible debridement of the underlying bone.  However, given his likely underlying malnutrition and complicated course he had after his back surgery, this does risk increasing the wound size and having to undergo the course of prolonged wound care  that he has already gone through.  We also discussed the use of topical lidocaine in the area to help with pain control.  Overall, the daughter states that they would like to avoid any surgery and are willing to discuss with their primary care physician the use of lidocaine.  They will also try the foam dressings over the back to see whether this helps with pain control in area.  The daughter is leaving town and at the latest they will follow up in 6 weeks time.  He will follow up earlier if he is able to take transportation from his Facility. The patient states that he does not want to have any further doctor visits if they are not going to help him. In this manner, they have not scheduled any further follow up with their orthopedic surgeon.  However, they are willing to return here for another visit.          ______________________________ Irene Limbo, MD MBA     BT/MEDQ  D:  02/20/2014  T:  02/21/2014  Job:  [id number]

## 2014-04-10 ENCOUNTER — Encounter (HOSPITAL_BASED_OUTPATIENT_CLINIC_OR_DEPARTMENT_OTHER): Payer: Medicare Other | Attending: Plastic Surgery

## 2014-04-10 DIAGNOSIS — L905 Scar conditions and fibrosis of skin: Secondary | ICD-10-CM | POA: Diagnosis not present

## 2014-04-10 DIAGNOSIS — Z981 Arthrodesis status: Secondary | ICD-10-CM | POA: Insufficient documentation

## 2014-07-27 ENCOUNTER — Encounter (HOSPITAL_COMMUNITY): Payer: Self-pay | Admitting: Vascular Surgery

## 2015-02-18 IMAGING — CR DG LUMBAR SPINE 2-3V
3 series · 3 of 3 positions shown · non-contrast
Comparison: 12/23/2012

CLINICAL DATA: Preop fusion.  Low back pain.

LUMBAR SPINE - 2-3 VIEW

[t lumbar spine ap]
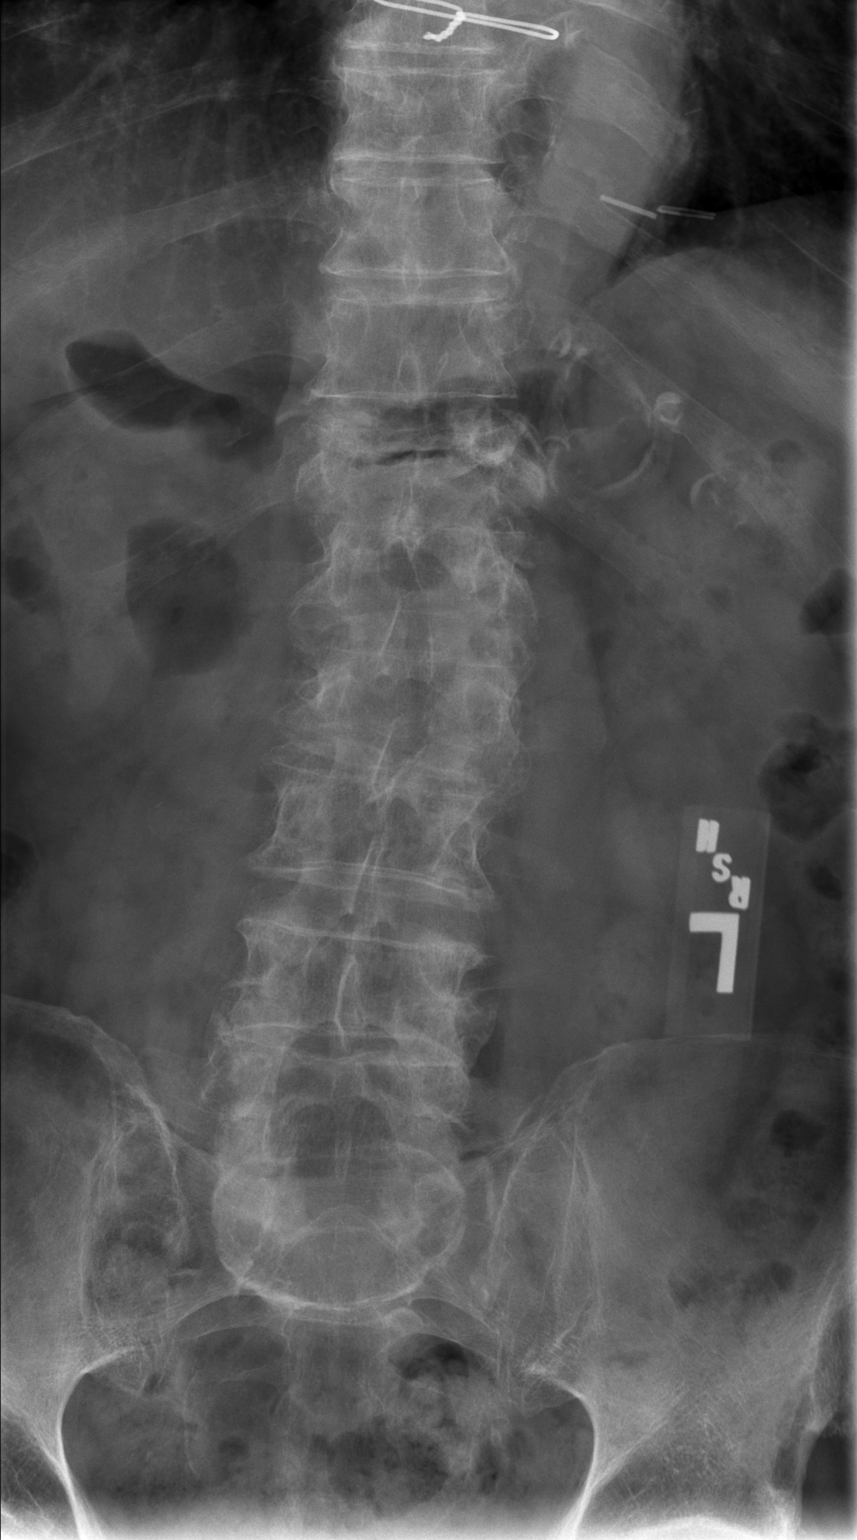

[t lumbar spine lat]
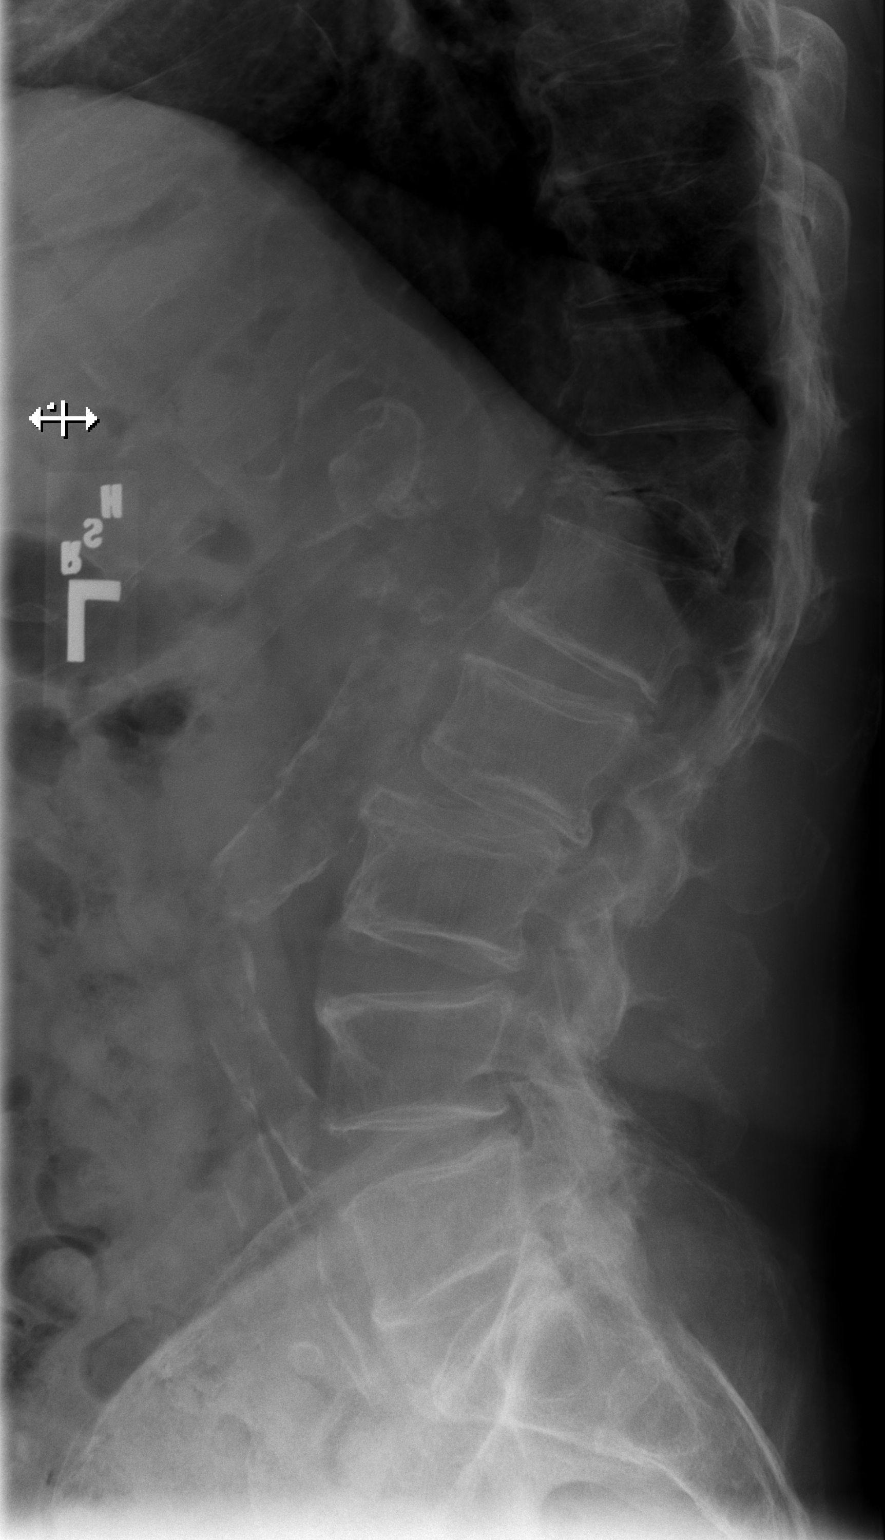

[t lumbar l-5 s-1 spot]
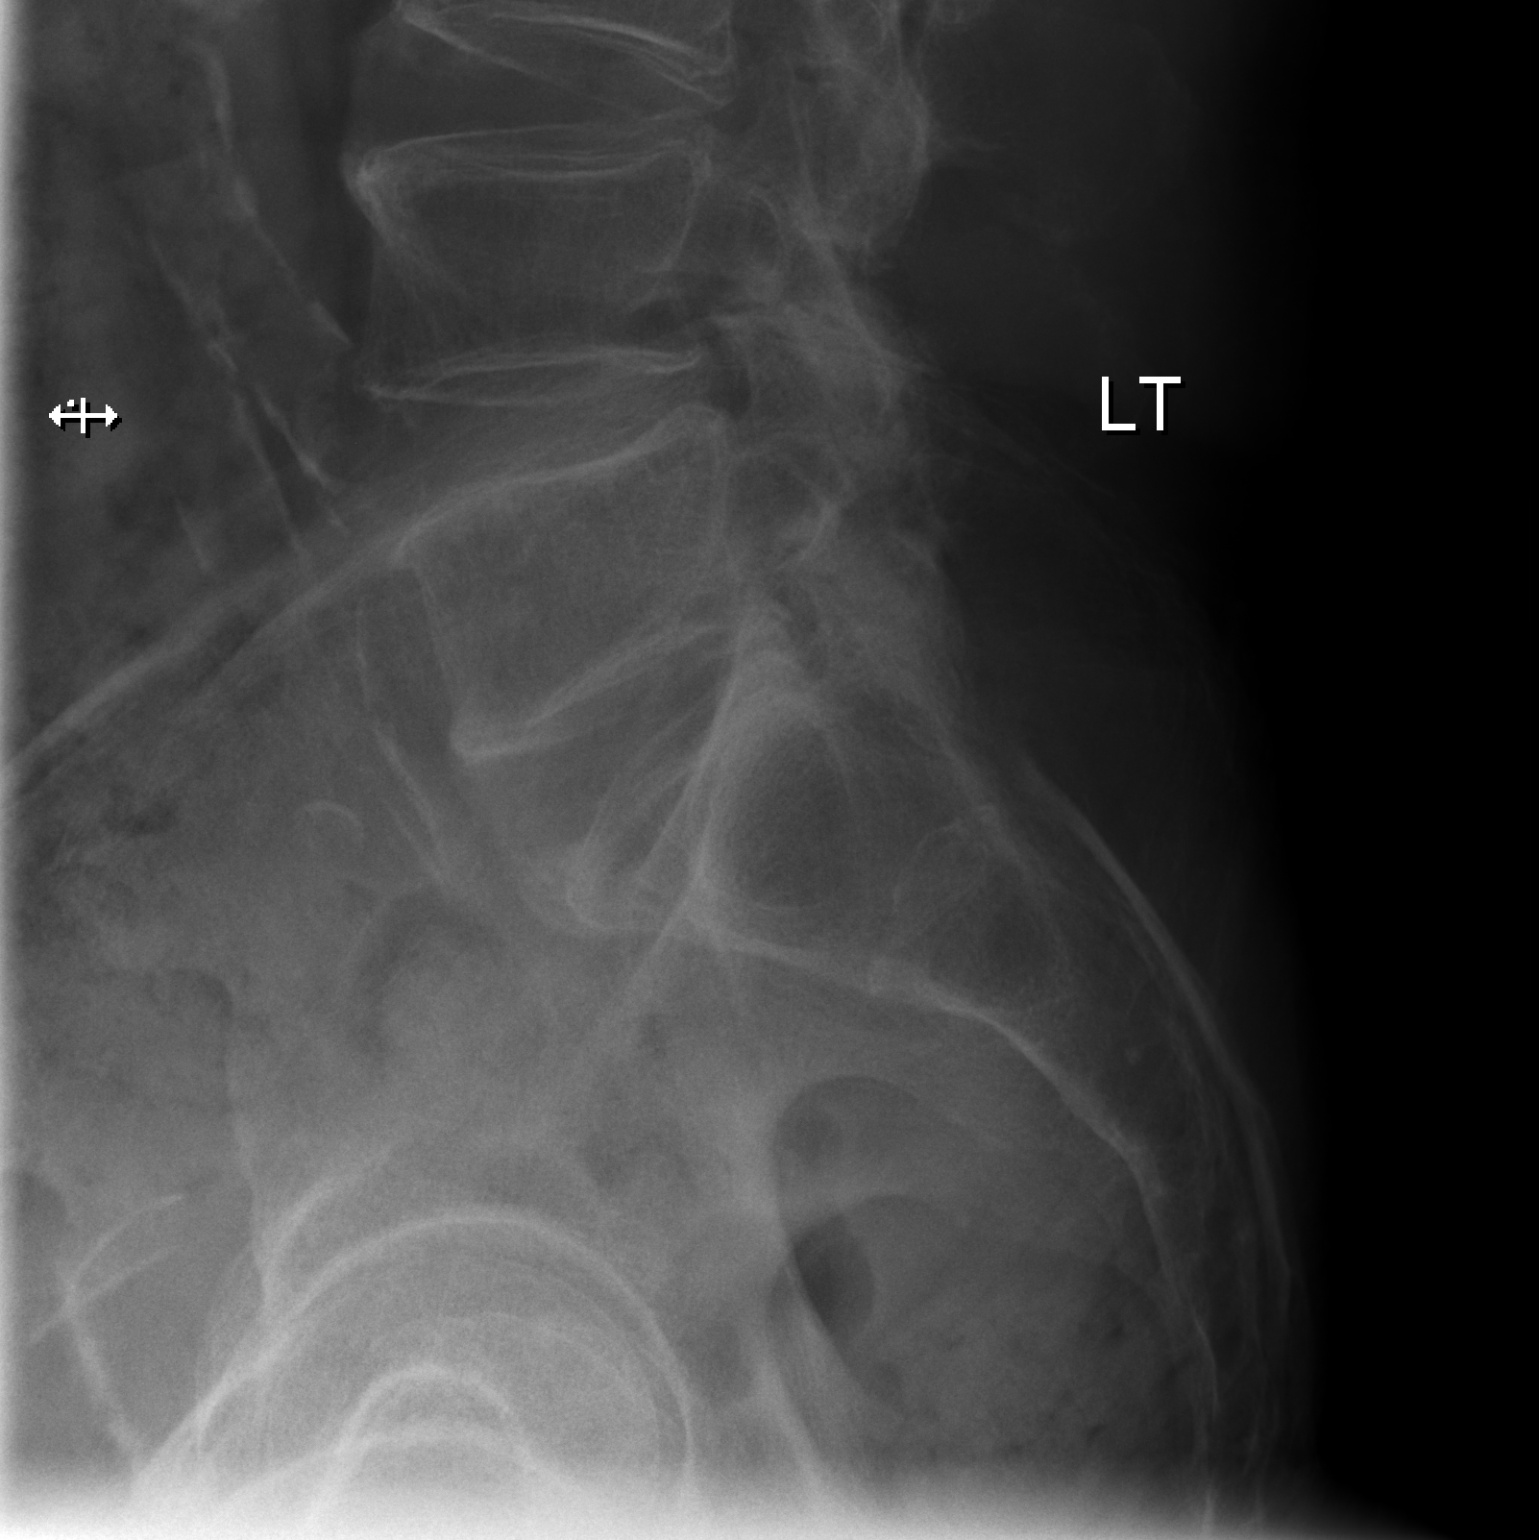

[3 of 3 positions shown; findings below may reference images not displayed]

FINDINGS: Severe compression fracture again noted at T12, slightly
progressed since prior study.  Associated kyphosis at this level.

Diffuse degenerative disc changes throughout the lumbar spine.
Degenerative facet disease in the mid and lower lumbar spine.  No
acute bony abnormality.

Aortic and iliac calcifications are present without visible
aneurysm.
IMPRESSION: Severe T12 compression fracture, slightly progressed.

Degenerative disc and facet disease.

## 2015-02-18 IMAGING — CR DG CHEST 2V
2 series · 2 of 2 positions shown · non-contrast
Comparison: None.

CLINICAL DATA: Preop low back surgery.  Hypertension.

CHEST - 2 VIEW

[w chest pa]
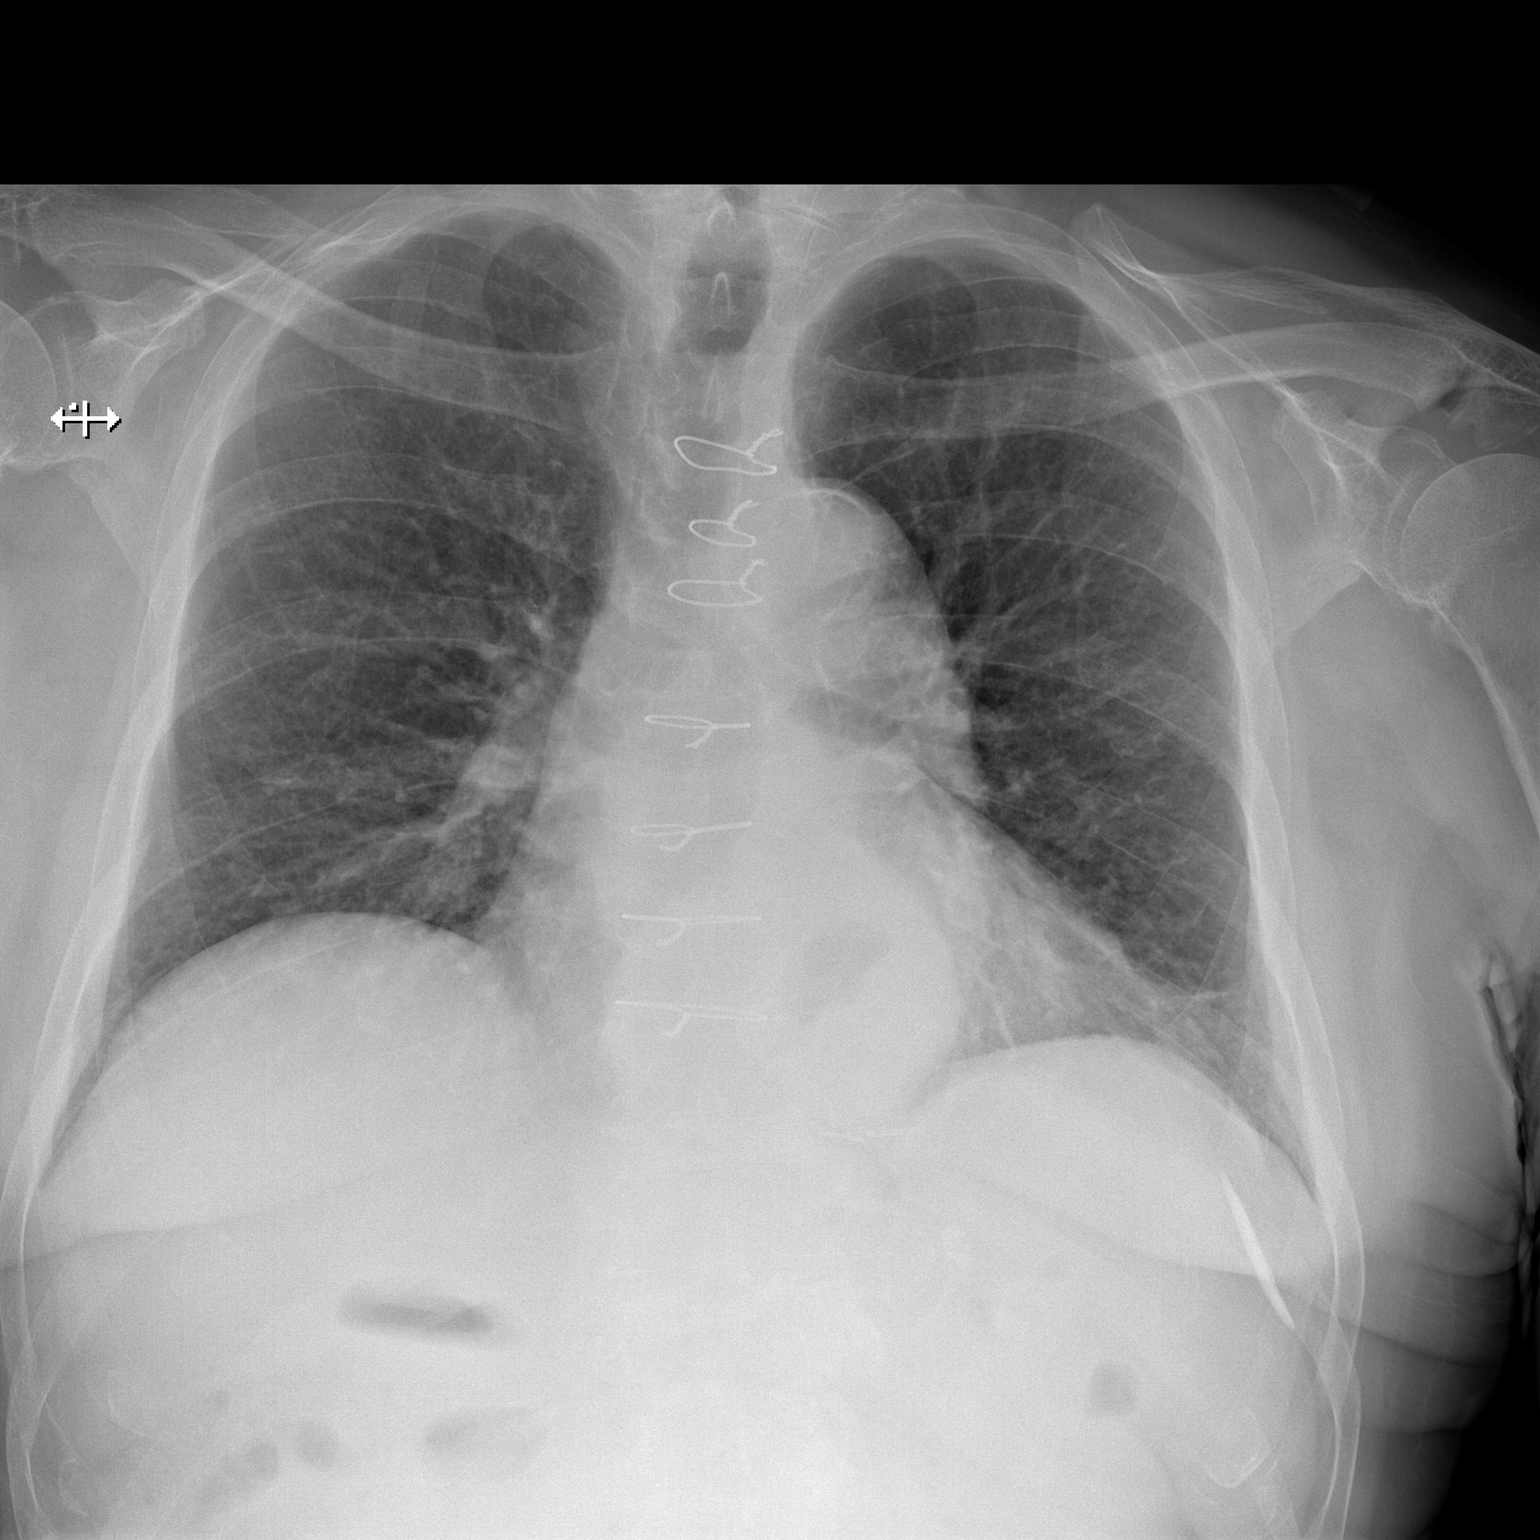

[w chest lat]
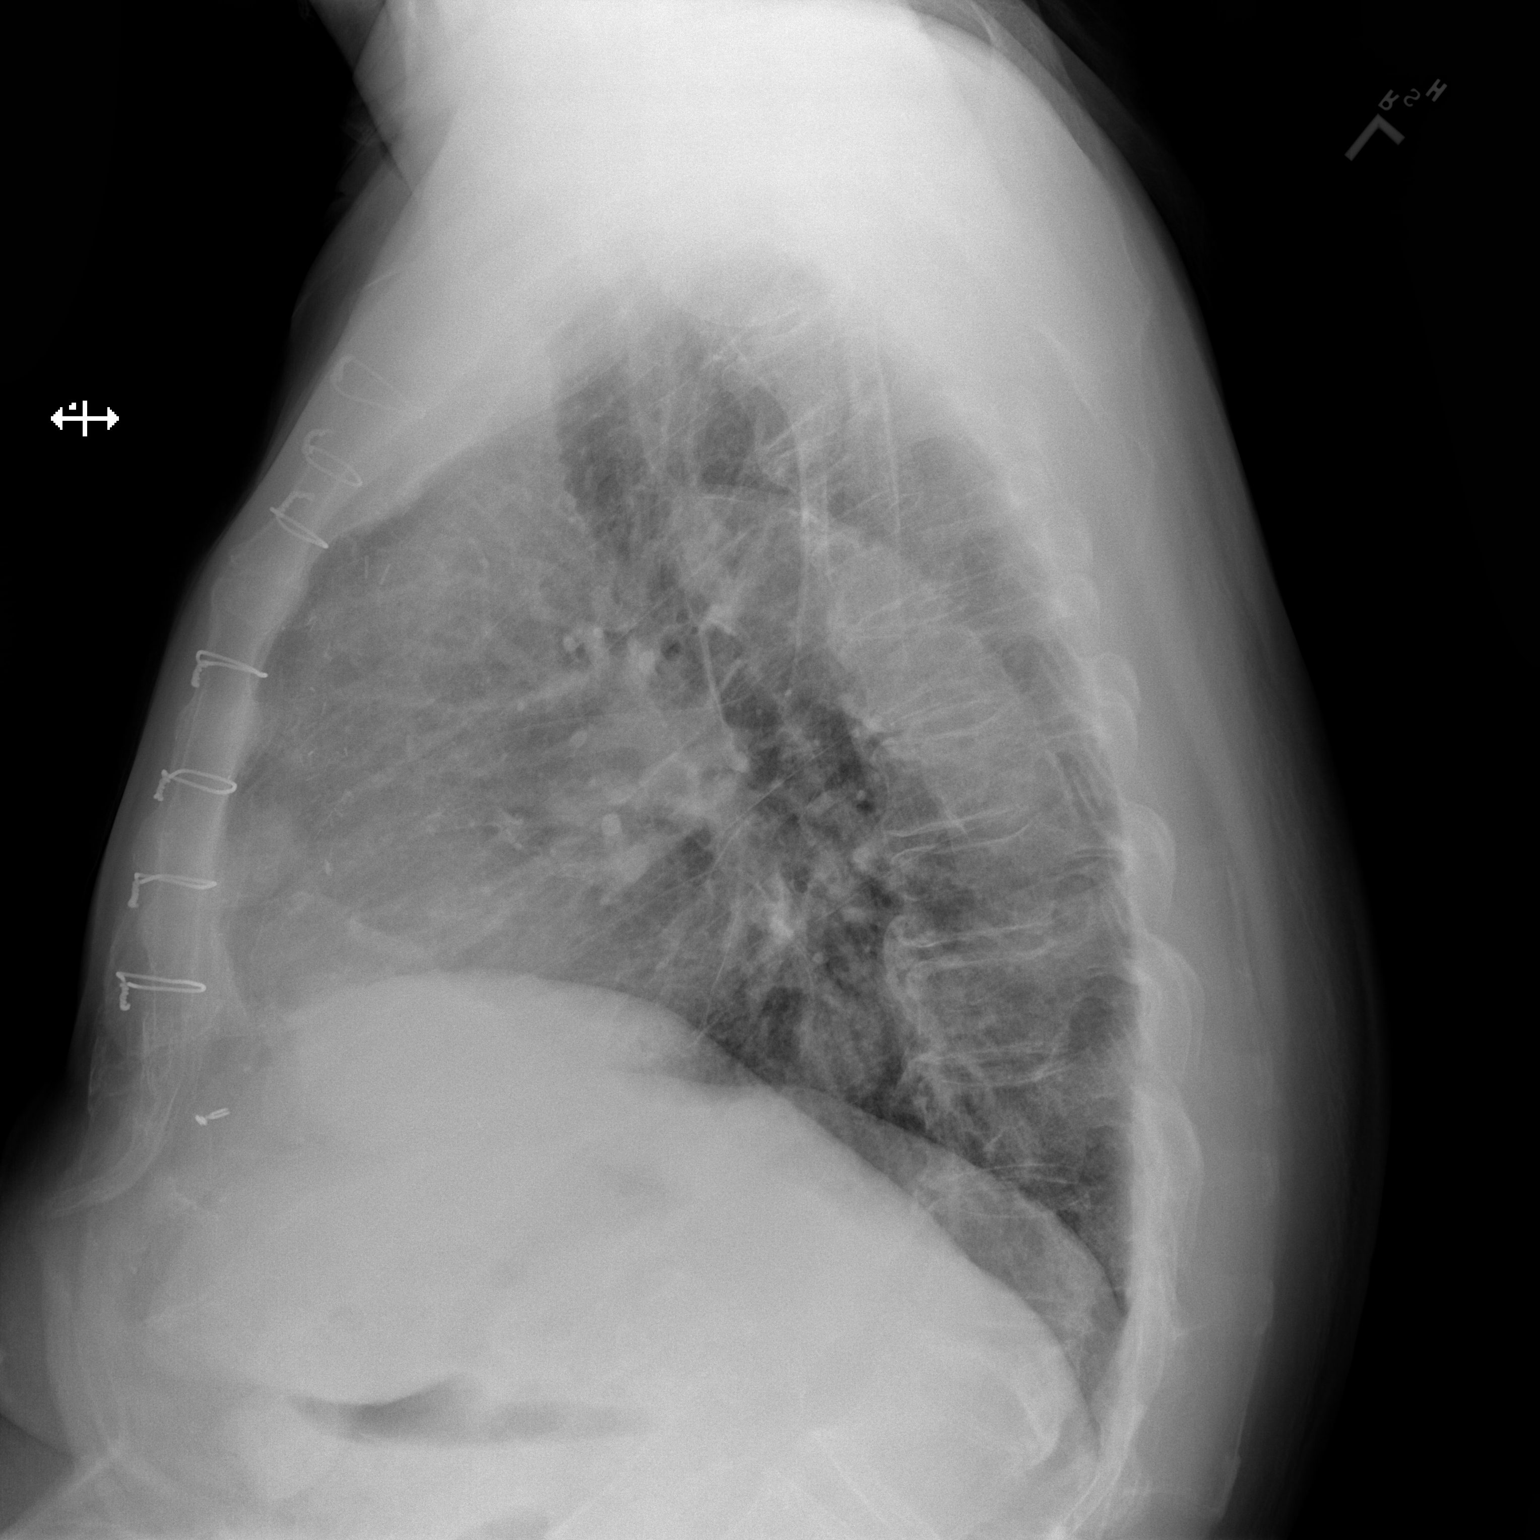

[2 of 2 positions shown; findings below may reference images not displayed]

FINDINGS: Moderate sized hiatal hernia.  Linear densities at the
left base, likely scarring.  Right lung is clear.  Heart is
borderline in size.  No effusions.

Severe compression fracture in the lower thoracic spine, likely T12
with associated kyphosis.  Prior median sternotomy and CABG.
IMPRESSION: Left basilar scarring.  Borderline heart size.  Moderate sized
hiatal hernia.

Severe T12 compression fracture.

## 2015-05-19 DEATH — deceased
